# Patient Record
Sex: Male | Born: 1945 | Race: White | Hispanic: No | Marital: Married | Smoking: Former smoker
Health system: Southern US, Community
[De-identification: ages and names within clinical notes are randomized; demographics above are authoritative.]

## PROBLEM LIST (undated history)

## (undated) DIAGNOSIS — F4310 Post-traumatic stress disorder, unspecified: Secondary | ICD-10-CM

## (undated) DIAGNOSIS — M961 Postlaminectomy syndrome, not elsewhere classified: Secondary | ICD-10-CM

## (undated) DIAGNOSIS — M109 Gout, unspecified: Secondary | ICD-10-CM

## (undated) DIAGNOSIS — I10 Essential (primary) hypertension: Secondary | ICD-10-CM

## (undated) DIAGNOSIS — I272 Pulmonary hypertension, unspecified: Secondary | ICD-10-CM

## (undated) DIAGNOSIS — E785 Hyperlipidemia, unspecified: Secondary | ICD-10-CM

## (undated) DIAGNOSIS — E538 Deficiency of other specified B group vitamins: Secondary | ICD-10-CM

## (undated) DIAGNOSIS — H905 Unspecified sensorineural hearing loss: Secondary | ICD-10-CM

## (undated) DIAGNOSIS — G629 Polyneuropathy, unspecified: Secondary | ICD-10-CM

## (undated) DIAGNOSIS — I509 Heart failure, unspecified: Secondary | ICD-10-CM

## (undated) DIAGNOSIS — R251 Tremor, unspecified: Secondary | ICD-10-CM

## (undated) DIAGNOSIS — K7460 Unspecified cirrhosis of liver: Secondary | ICD-10-CM

## (undated) DIAGNOSIS — I700 Atherosclerosis of aorta: Secondary | ICD-10-CM

## (undated) DIAGNOSIS — I493 Ventricular premature depolarization: Secondary | ICD-10-CM

## (undated) DIAGNOSIS — E611 Iron deficiency: Secondary | ICD-10-CM

## (undated) DIAGNOSIS — I4891 Unspecified atrial fibrillation: Secondary | ICD-10-CM

## (undated) DIAGNOSIS — IMO0002 Reserved for concepts with insufficient information to code with codable children: Secondary | ICD-10-CM

## (undated) DIAGNOSIS — I779 Disorder of arteries and arterioles, unspecified: Secondary | ICD-10-CM

## (undated) DIAGNOSIS — G8929 Other chronic pain: Secondary | ICD-10-CM

## (undated) DIAGNOSIS — F32A Depression, unspecified: Secondary | ICD-10-CM

## (undated) DIAGNOSIS — F03A0 Unspecified dementia, mild, without behavioral disturbance, psychotic disturbance, mood disturbance, and anxiety: Secondary | ICD-10-CM

## (undated) DIAGNOSIS — K761 Chronic passive congestion of liver: Secondary | ICD-10-CM

## (undated) DIAGNOSIS — I2510 Atherosclerotic heart disease of native coronary artery without angina pectoris: Secondary | ICD-10-CM

## (undated) DIAGNOSIS — K219 Gastro-esophageal reflux disease without esophagitis: Secondary | ICD-10-CM

## (undated) DIAGNOSIS — I4729 Other ventricular tachycardia: Secondary | ICD-10-CM

## (undated) DIAGNOSIS — M545 Low back pain, unspecified: Secondary | ICD-10-CM

## (undated) DIAGNOSIS — K7400 Hepatic fibrosis, unspecified: Secondary | ICD-10-CM

## (undated) DIAGNOSIS — Q600 Renal agenesis, unilateral: Secondary | ICD-10-CM

## (undated) DIAGNOSIS — I255 Ischemic cardiomyopathy: Secondary | ICD-10-CM

## (undated) DIAGNOSIS — M48061 Spinal stenosis, lumbar region without neurogenic claudication: Secondary | ICD-10-CM

## (undated) DIAGNOSIS — K5790 Diverticulosis of intestine, part unspecified, without perforation or abscess without bleeding: Secondary | ICD-10-CM

## (undated) DIAGNOSIS — G4700 Insomnia, unspecified: Secondary | ICD-10-CM

## (undated) DIAGNOSIS — I447 Left bundle-branch block, unspecified: Secondary | ICD-10-CM

## (undated) DIAGNOSIS — I219 Acute myocardial infarction, unspecified: Secondary | ICD-10-CM

## (undated) DIAGNOSIS — D649 Anemia, unspecified: Secondary | ICD-10-CM

## (undated) DIAGNOSIS — F1021 Alcohol dependence, in remission: Secondary | ICD-10-CM

## (undated) DIAGNOSIS — N189 Chronic kidney disease, unspecified: Secondary | ICD-10-CM

## (undated) DIAGNOSIS — G894 Chronic pain syndrome: Secondary | ICD-10-CM

## (undated) DIAGNOSIS — R262 Difficulty in walking, not elsewhere classified: Secondary | ICD-10-CM

## (undated) DIAGNOSIS — K859 Acute pancreatitis without necrosis or infection, unspecified: Secondary | ICD-10-CM

## (undated) HISTORY — PX: LUMBAR SPINE SURGERY: SHX701

## (undated) HISTORY — PX: SACROILIAC JOINT FUSION: SHX6088

## (undated) HISTORY — PX: JOINT REPLACEMENT: SHX530

## (undated) HISTORY — PX: BACK SURGERY: SHX140

## (undated) HISTORY — PX: SHOULDER ARTHROSCOPY: SHX128

## (undated) HISTORY — PX: CARDIAC DEFIBRILLATOR PLACEMENT: SHX171

## (undated) HISTORY — PX: ATRIAL TACH ABLATION: EP1192

## (undated) HISTORY — PX: TOTAL KNEE ARTHROPLASTY: SHX125

## (undated) HISTORY — PX: CHOLECYSTECTOMY: SHX55

## (undated) HISTORY — PX: CORONARY ARTERY BYPASS GRAFT: SHX141

---
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
## 2003-10-26 ENCOUNTER — Other Ambulatory Visit: Payer: Self-pay

## 2004-11-14 ENCOUNTER — Ambulatory Visit: Payer: Self-pay | Admitting: General Surgery

## 2004-11-16 ENCOUNTER — Other Ambulatory Visit: Payer: Self-pay

## 2004-11-23 ENCOUNTER — Ambulatory Visit: Payer: Self-pay | Admitting: General Surgery

## 2004-12-21 ENCOUNTER — Ambulatory Visit: Payer: Self-pay | Admitting: General Surgery

## 2005-10-11 ENCOUNTER — Ambulatory Visit: Payer: Self-pay | Admitting: General Surgery

## 2006-05-30 IMAGING — US ABDOMEN ULTRASOUND
1 series · 14 of 25 positions shown · non-contrast
Comparison: none

REASON FOR EXAM: RUQ pain, follow-up CT of 07/23/2002 pancreas and history
of splenic hematoma
COMMENTS:

[Series 1: abdomen ultrasound · 0.38mm/px · 14 of 62 slices shown]
[im 1/62]
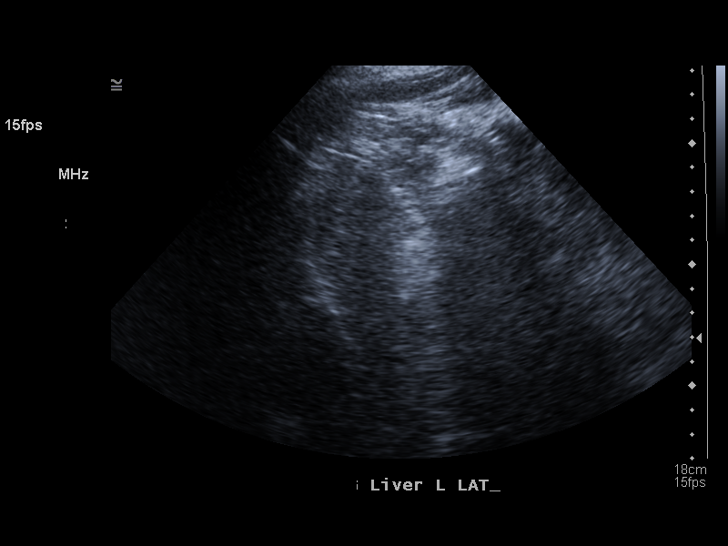
[im 6/62]
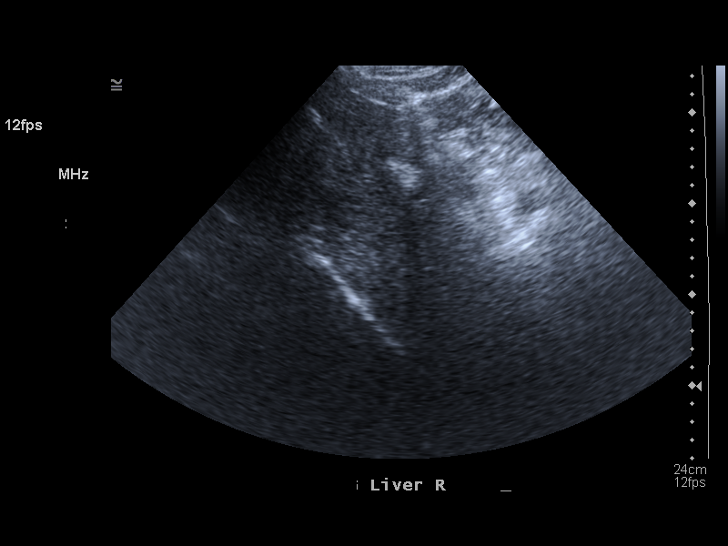
[im 11/62]
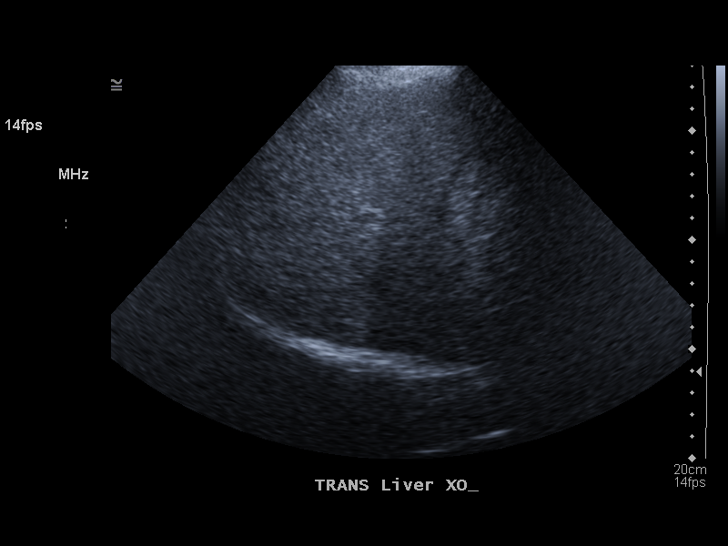
[im 16/62]
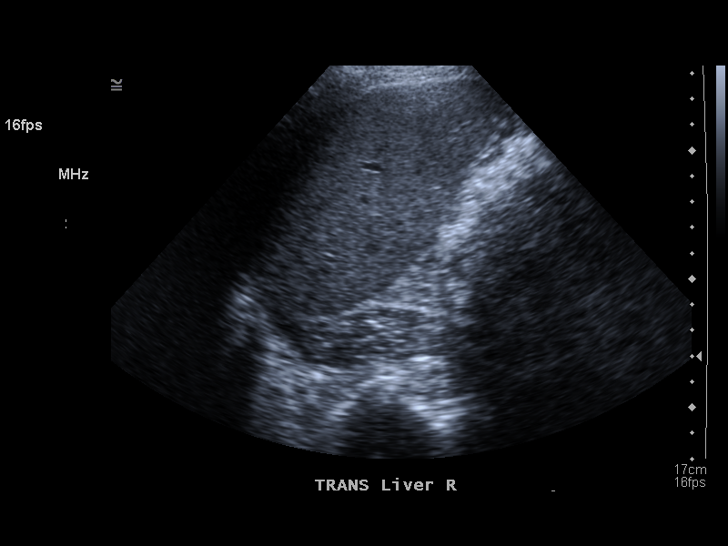
[im 21/62]
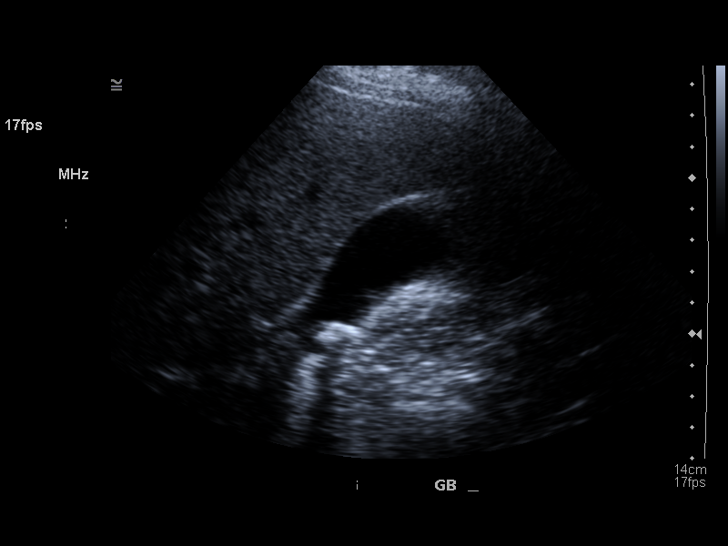
[im 23/62]
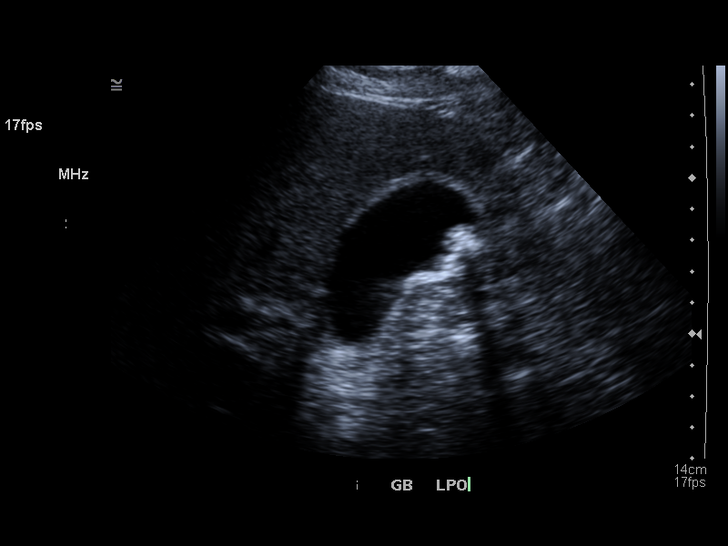
[im 28/62]
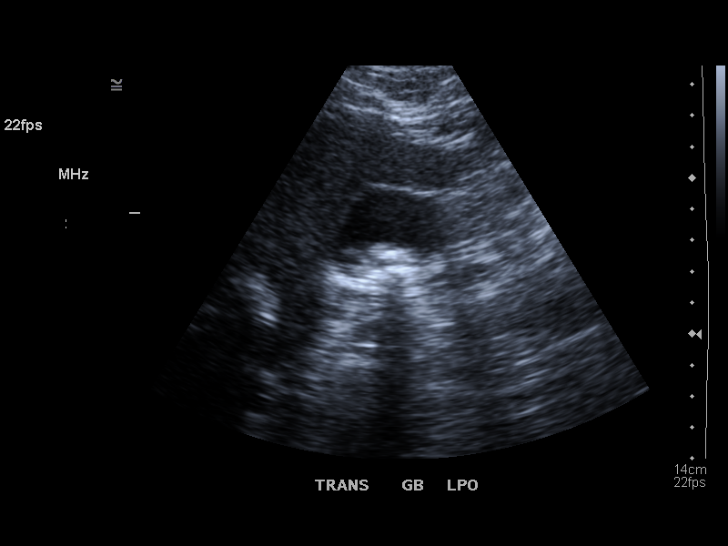
[im 34/62]
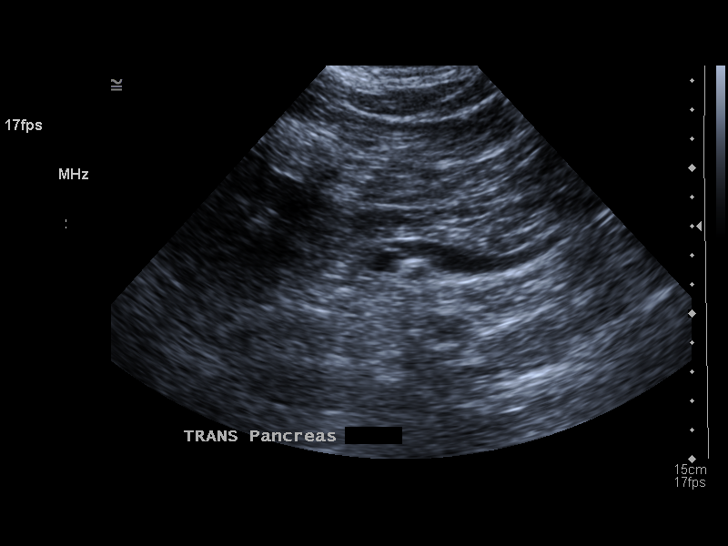
[im 39/62]
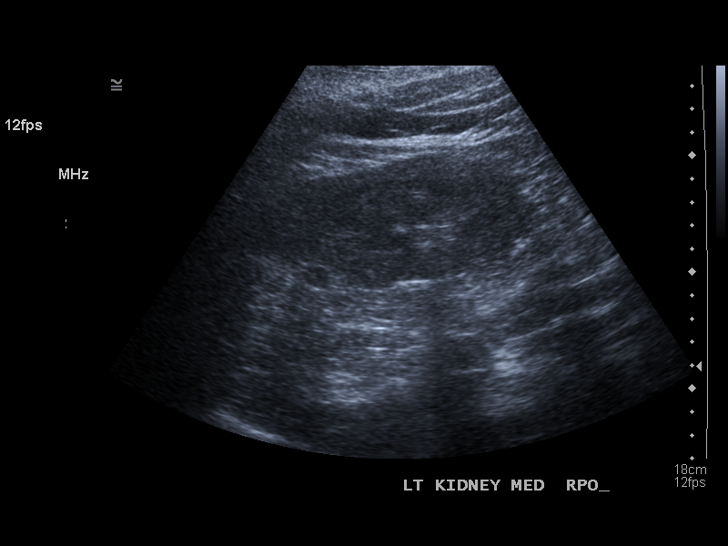
[im 41/62]
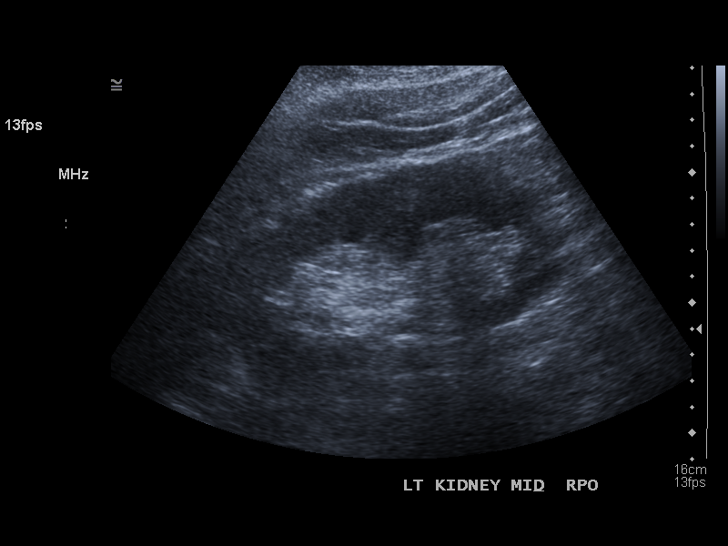
[im 46/62]
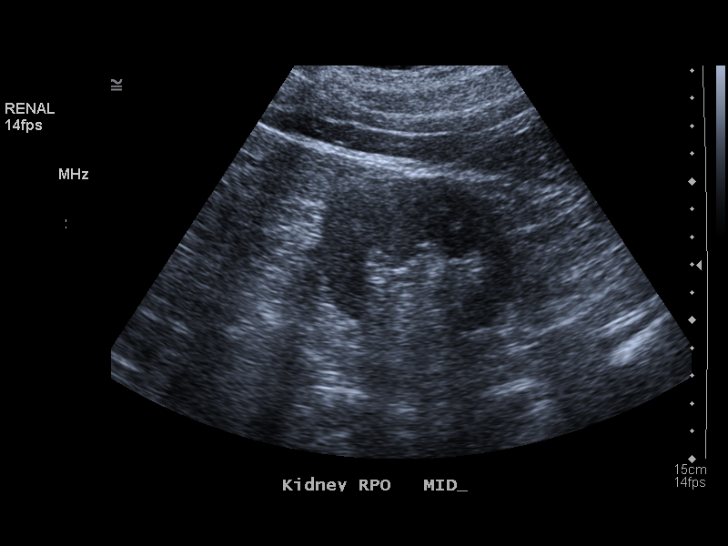
[im 51/62]
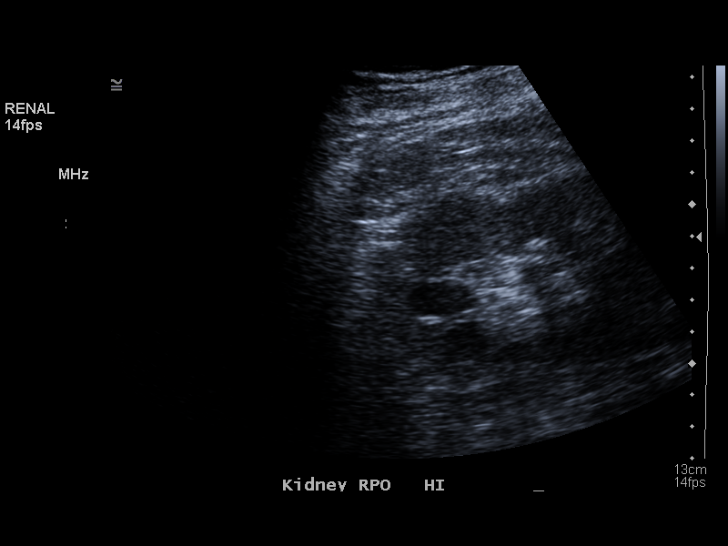
[im 56/62]
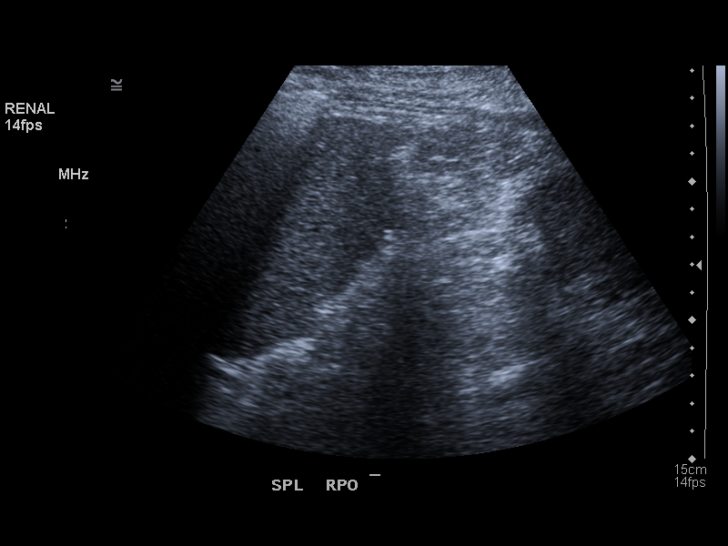
[im 62/62]
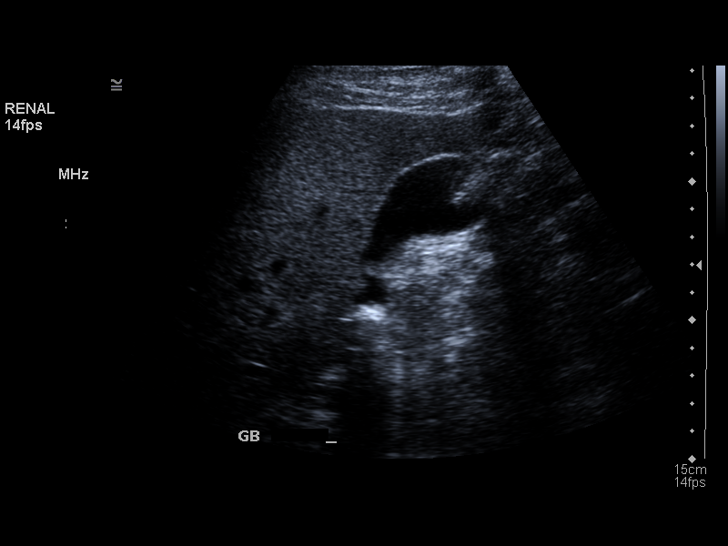

[14 of 25 positions shown; findings below may reference images not displayed]

PROCEDURE:     US  - US ABDOMEN GENERAL SURVEY  - November 14, 2004 [DATE]

RESULT:     Real-time imaging was obtained. The gallbladder is well
identified containing multiple echogenic densities with shadowing compatible
with gallstones. It appears there is a calculus lodged in the neck of the
gallbladder. The common duct appears within normal limits measuring 4.4 mm.

The pancreas is not well visualized. The liver and spleen appear normal in
size with appropriate echotexture. The RIGHT kidney is not identified. No
hydronephrosis is seen on the LEFT. There is a cyst identified in the upper
pole of the LEFT kidney measuring 2.0 x 1.2 cm.
IMPRESSION: 1.     Multiple gallstones are present with one possibly lodged in the neck
of the gallbladder. The gallbladder wall does not appear thickened. The
common duct is within normal limits in size.
2.     Absent RIGHT kidney is noted.
3.     No hydronephrosis on the LEFT.
4.     There is a cyst in the upper pole of the LEFT kidney.

## 2006-09-09 ENCOUNTER — Inpatient Hospital Stay: Payer: Self-pay | Admitting: Cardiology

## 2006-09-10 ENCOUNTER — Other Ambulatory Visit: Payer: Self-pay

## 2006-09-10 DIAGNOSIS — I219 Acute myocardial infarction, unspecified: Secondary | ICD-10-CM

## 2006-09-10 HISTORY — PX: LEFT HEART CATH AND CORONARY ANGIOGRAPHY: CATH118249

## 2006-09-10 HISTORY — DX: Acute myocardial infarction, unspecified: I21.9

## 2006-09-10 HISTORY — PX: CARDIAC CATHETERIZATION: SHX172

## 2006-09-11 ENCOUNTER — Other Ambulatory Visit: Payer: Self-pay

## 2006-09-13 DIAGNOSIS — Z951 Presence of aortocoronary bypass graft: Secondary | ICD-10-CM

## 2006-09-13 HISTORY — PX: CORONARY ARTERY BYPASS GRAFT: SHX141

## 2006-09-13 HISTORY — DX: Presence of aortocoronary bypass graft: Z95.1

## 2006-10-16 ENCOUNTER — Encounter: Payer: Self-pay | Admitting: Cardiology

## 2006-11-09 ENCOUNTER — Encounter: Payer: Self-pay | Admitting: Cardiology

## 2006-12-10 ENCOUNTER — Encounter: Payer: Self-pay | Admitting: Cardiology

## 2007-01-09 ENCOUNTER — Encounter: Payer: Self-pay | Admitting: Cardiology

## 2007-02-09 ENCOUNTER — Encounter: Payer: Self-pay | Admitting: Cardiology

## 2008-04-30 ENCOUNTER — Ambulatory Visit: Payer: Self-pay | Admitting: Internal Medicine

## 2010-12-05 ENCOUNTER — Ambulatory Visit: Payer: Self-pay | Admitting: Otolaryngology

## 2010-12-07 ENCOUNTER — Inpatient Hospital Stay: Payer: Self-pay | Admitting: Internal Medicine

## 2011-04-26 ENCOUNTER — Ambulatory Visit: Payer: Self-pay

## 2011-06-12 ENCOUNTER — Ambulatory Visit: Payer: Self-pay | Admitting: Unknown Physician Specialty

## 2011-06-12 DIAGNOSIS — I10 Essential (primary) hypertension: Secondary | ICD-10-CM

## 2011-06-21 ENCOUNTER — Inpatient Hospital Stay: Payer: Self-pay | Admitting: Unknown Physician Specialty

## 2012-08-18 ENCOUNTER — Ambulatory Visit: Payer: Self-pay | Admitting: Unknown Physician Specialty

## 2012-09-12 ENCOUNTER — Ambulatory Visit: Payer: Self-pay | Admitting: Unknown Physician Specialty

## 2013-03-10 ENCOUNTER — Ambulatory Visit: Payer: Self-pay | Admitting: Internal Medicine

## 2013-03-18 ENCOUNTER — Ambulatory Visit: Payer: Self-pay | Admitting: Internal Medicine

## 2013-09-07 ENCOUNTER — Ambulatory Visit: Payer: Self-pay | Admitting: Gastroenterology

## 2013-09-07 LAB — CBC WITH DIFFERENTIAL/PLATELET
Basophil #: 0 10*3/uL (ref 0.0–0.1)
Eosinophil #: 0.4 10*3/uL (ref 0.0–0.7)
HGB: 11.7 g/dL — ABNORMAL LOW (ref 13.0–18.0)
Lymphocyte %: 12.1 %
MCH: 24.7 pg — ABNORMAL LOW (ref 26.0–34.0)
MCHC: 31.4 g/dL — ABNORMAL LOW (ref 32.0–36.0)
MCV: 79 fL — ABNORMAL LOW (ref 80–100)
Monocyte %: 7.1 %
Neutrophil #: 4.7 10*3/uL (ref 1.4–6.5)
Platelet: 224 10*3/uL (ref 150–440)
RDW: 27.4 % — ABNORMAL HIGH (ref 11.5–14.5)
WBC: 6.3 10*3/uL (ref 3.8–10.6)

## 2013-09-07 LAB — PROTIME-INR: INR: 1

## 2013-09-08 LAB — PATHOLOGY REPORT

## 2014-06-21 ENCOUNTER — Ambulatory Visit: Payer: Self-pay | Admitting: Internal Medicine

## 2014-08-09 DIAGNOSIS — E785 Hyperlipidemia, unspecified: Secondary | ICD-10-CM | POA: Insufficient documentation

## 2014-08-09 DIAGNOSIS — I10 Essential (primary) hypertension: Secondary | ICD-10-CM | POA: Insufficient documentation

## 2014-08-09 DIAGNOSIS — I5022 Chronic systolic (congestive) heart failure: Secondary | ICD-10-CM | POA: Insufficient documentation

## 2015-03-09 ENCOUNTER — Other Ambulatory Visit: Payer: Self-pay | Admitting: Specialist

## 2015-03-09 DIAGNOSIS — M5186 Other intervertebral disc disorders, lumbar region: Secondary | ICD-10-CM

## 2015-03-17 ENCOUNTER — Ambulatory Visit
Admission: RE | Admit: 2015-03-17 | Discharge: 2015-03-17 | Disposition: A | Discharge status: Home / Self Care | Payer: Medicare Other | Source: Ambulatory Visit | Attending: Specialist | Admitting: Specialist

## 2015-03-17 DIAGNOSIS — M4807 Spinal stenosis, lumbosacral region: Secondary | ICD-10-CM | POA: Insufficient documentation

## 2015-03-17 DIAGNOSIS — M4806 Spinal stenosis, lumbar region: Secondary | ICD-10-CM | POA: Insufficient documentation

## 2015-03-17 DIAGNOSIS — M5186 Other intervertebral disc disorders, lumbar region: Secondary | ICD-10-CM

## 2015-03-17 DIAGNOSIS — M5136 Other intervertebral disc degeneration, lumbar region: Secondary | ICD-10-CM | POA: Diagnosis present

## 2015-04-05 ENCOUNTER — Other Ambulatory Visit: Payer: Self-pay | Admitting: Cardiology

## 2015-04-05 ENCOUNTER — Ambulatory Visit
Admission: RE | Admit: 2015-04-05 | Discharge: 2015-04-05 | Disposition: A | Discharge status: Home / Self Care | Payer: Medicare Other | Source: Ambulatory Visit | Attending: Cardiology | Admitting: Cardiology

## 2015-04-05 DIAGNOSIS — R079 Chest pain, unspecified: Secondary | ICD-10-CM | POA: Diagnosis not present

## 2015-04-05 DIAGNOSIS — Z01818 Encounter for other preprocedural examination: Secondary | ICD-10-CM

## 2015-04-05 HISTORY — DX: Acute myocardial infarction, unspecified: I21.9

## 2015-04-05 MED ORDER — TECHNETIUM TC 99M SESTAMIBI - CARDIOLITE
13.5300 | Freq: Once | INTRAVENOUS | Status: AC | PRN
  Administered 2015-04-05: 13.35 via INTRAVENOUS

## 2015-04-05 MED ORDER — REGADENOSON 0.4 MG/5ML IV SOLN
0.4000 mg | Freq: Once | INTRAVENOUS | Status: AC
  Administered 2015-04-05: 0.4 mg via INTRAVENOUS
  Filled 2015-04-05: qty 5

## 2015-04-05 MED ORDER — TECHNETIUM TC 99M SESTAMIBI - CARDIOLITE
30.0000 | Freq: Once | INTRAVENOUS | Status: AC | PRN
  Administered 2015-04-05: 14:00:00 32.53 via INTRAVENOUS

## 2015-04-06 LAB — NM MYOCAR MULTI W/SPECT W/WALL MOTION / EF
CHL CUP RESTING HR STRESS: 68 {beats}/min
CSEPEDS: 0 s
Estimated workload: 1 METS
Exercise duration (min): 1 min
Peak HR: 94 {beats}/min

## 2016-09-20 ENCOUNTER — Ambulatory Visit: Payer: No Typology Code available for payment source | Admitting: Podiatry

## 2016-09-24 ENCOUNTER — Other Ambulatory Visit: Payer: Self-pay

## 2016-09-24 ENCOUNTER — Ambulatory Visit (INDEPENDENT_AMBULATORY_CARE_PROVIDER_SITE_OTHER): Payer: Medicare Other | Admitting: Podiatry

## 2016-09-24 ENCOUNTER — Encounter: Payer: Self-pay | Admitting: Podiatry

## 2016-09-24 ENCOUNTER — Ambulatory Visit (INDEPENDENT_AMBULATORY_CARE_PROVIDER_SITE_OTHER): Payer: Medicare Other

## 2016-09-24 ENCOUNTER — Ambulatory Visit: Payer: Medicare Other

## 2016-09-24 VITALS — BP 151/83 | HR 81 | Resp 16 | Ht 69.0 in | Wt 189.0 lb

## 2016-09-24 DIAGNOSIS — M21619 Bunion of unspecified foot: Secondary | ICD-10-CM

## 2016-09-24 DIAGNOSIS — M779 Enthesopathy, unspecified: Secondary | ICD-10-CM

## 2016-09-24 DIAGNOSIS — G629 Polyneuropathy, unspecified: Secondary | ICD-10-CM

## 2016-09-24 MED ORDER — TRIAMCINOLONE ACETONIDE 10 MG/ML IJ SUSP
10.0000 mg | Freq: Once | INTRAMUSCULAR | Status: AC
  Administered 2016-09-24: 10 mg

## 2016-09-24 MED ORDER — GABAPENTIN 400 MG PO CAPS: 400.0000 mg | ORAL_CAPSULE | Freq: Three times a day (TID) | ORAL | 5 refills | Status: DC

## 2016-09-24 NOTE — Progress Notes (Signed)
   Subjective:    Patient ID: Danny ChampagneRichard Cully Hymes, male    DOB: [birth year], 71 y.o.   MRN: [id number]  HPI  Chief Complaint  Patient presents with  . Foot Pain    BL, Pt states that he "has burning and pain all over both feet" off and on x 1 year. Right foot is worse than left. Pt's PCP dx with him possible gout in the past.   . Possible Bunions    BL x 1 year.        Review of Systems     Objective:   Physical Exam        Assessment & Plan:

## 2016-09-25 ENCOUNTER — Telehealth: Payer: Self-pay | Admitting: *Deleted

## 2016-09-25 NOTE — Telephone Encounter (Addendum)
Pt states Dr. Charlsie Merlesegal put him on Gabapentin 400mg  and he reviewed the product information and it states helps with sleep and calm as two of the affects. Pt states he is already on Ambien at night and Zoloft during the day and he didn't want to over dose.09/28/2016-Dr. Regal states the make up of the Gabapentin is more geared to stopping nerve pain than to being a medication for sleep and would not likely cause any problems with the other medications. Left message informing pt of Dr. Beverlee Nimsegal's recommendation.

## 2016-09-25 NOTE — Progress Notes (Signed)
Subjective:     Patient ID: Danny ChampagneRichard Cully Kinner, male   DOB: March 12, 1946, 71 y.o.   MRN: [id number]  HPI patient presents with pain in his forefoot right over left and history of severe burning and numbness for about 8 months. He did have an accident and had significant back surgery about 9 months ago and the pain has progressed since   Review of Systems  All other systems reviewed and are negative.      Objective:   Physical Exam  Constitutional: He is oriented to person, place, and time.  Cardiovascular: Intact distal pulses.   Musculoskeletal: Normal range of motion.  Neurological: He is oriented to person, place, and time.  Skin: Skin is warm and dry.  Nursing note and vitals reviewed.  neurovascular status found to be intact with muscle strength adequate range of motion within normal limits with patient noted to have mild to moderate reduction sharp Dole vibratory. Patient's found have inflammation which is most intense around the first MPJ right over left with mild reduction of joint motion but does have pain pretty much across the entire metatarsal area. 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 Also appears to have inflammatory changes around the first MPJ right over left with fluid buildup which may be contributory to his pain cycle    Plan:     H&P conditions reviewed and careful injection administered around the first MPJ right 3 mg Kenalog 5 mg Xylocaine and discussed neuropathy and have recommended we start gabapentin with one to be taken at night followed by 1 morning and afternoon if tolerated well. I did explain that this may make him drowsy or give him other symptoms and he may not be able to use it long-term but I am hopeful it will be of benefit to him. Patient will be reevaluated again in the next 5 weeks and I also  recommended a softer orthotic but I want to wait until we get him stabilized first  X-ray report indicates that there is mild to moderate elevation of the first metatarsal segment bilateral with mild spurring around the first metatarsal head right over left

## 2016-09-25 NOTE — Telephone Encounter (Signed)
Pt called again and was asking if he should start the medication that Dr Charlsie Merlesegal prescribed. Per Val we are waiting to hear from Dr Charlsie Merlesegal who was in surgery today and she would call him when he lets her know what he should do.Pt was advised to wait until he hears from the nurse to start the medication. Pt stated he really wanted to start it today.

## 2016-10-01 NOTE — Telephone Encounter (Signed)
Fine to start

## 2016-10-29 ENCOUNTER — Ambulatory Visit: Payer: Medicare Other | Admitting: Podiatry

## 2016-12-08 ENCOUNTER — Encounter: Payer: Self-pay | Admitting: Internal Medicine

## 2016-12-08 ENCOUNTER — Inpatient Hospital Stay
Admission: EM | Admit: 2016-12-08 | Discharge: 2016-12-09 | DRG: 309 | Disposition: A | Discharge status: Home / Self Care | Payer: Medicare Other | Attending: Internal Medicine | Admitting: Internal Medicine

## 2016-12-08 ENCOUNTER — Emergency Department: Payer: Medicare Other

## 2016-12-08 DIAGNOSIS — I2510 Atherosclerotic heart disease of native coronary artery without angina pectoris: Secondary | ICD-10-CM | POA: Diagnosis present

## 2016-12-08 DIAGNOSIS — Q600 Renal agenesis, unilateral: Secondary | ICD-10-CM | POA: Diagnosis not present

## 2016-12-08 DIAGNOSIS — Z79899 Other long term (current) drug therapy: Secondary | ICD-10-CM | POA: Diagnosis not present

## 2016-12-08 DIAGNOSIS — I130 Hypertensive heart and chronic kidney disease with heart failure and stage 1 through stage 4 chronic kidney disease, or unspecified chronic kidney disease: Secondary | ICD-10-CM | POA: Diagnosis present

## 2016-12-08 DIAGNOSIS — I4891 Unspecified atrial fibrillation: Principal | ICD-10-CM | POA: Diagnosis present

## 2016-12-08 DIAGNOSIS — Z87891 Personal history of nicotine dependence: Secondary | ICD-10-CM | POA: Diagnosis not present

## 2016-12-08 DIAGNOSIS — Z951 Presence of aortocoronary bypass graft: Secondary | ICD-10-CM

## 2016-12-08 DIAGNOSIS — F329 Major depressive disorder, single episode, unspecified: Secondary | ICD-10-CM | POA: Diagnosis present

## 2016-12-08 DIAGNOSIS — I255 Ischemic cardiomyopathy: Secondary | ICD-10-CM | POA: Diagnosis present

## 2016-12-08 DIAGNOSIS — I5022 Chronic systolic (congestive) heart failure: Secondary | ICD-10-CM | POA: Diagnosis present

## 2016-12-08 DIAGNOSIS — R000 Tachycardia, unspecified: Secondary | ICD-10-CM | POA: Diagnosis present

## 2016-12-08 DIAGNOSIS — Z8249 Family history of ischemic heart disease and other diseases of the circulatory system: Secondary | ICD-10-CM

## 2016-12-08 DIAGNOSIS — R0602 Shortness of breath: Secondary | ICD-10-CM | POA: Diagnosis present

## 2016-12-08 DIAGNOSIS — Z885 Allergy status to narcotic agent status: Secondary | ICD-10-CM

## 2016-12-08 DIAGNOSIS — I252 Old myocardial infarction: Secondary | ICD-10-CM | POA: Diagnosis not present

## 2016-12-08 HISTORY — DX: Essential (primary) hypertension: I10

## 2016-12-08 HISTORY — DX: Heart failure, unspecified: I50.9

## 2016-12-08 HISTORY — DX: Chronic kidney disease, unspecified: N18.9

## 2016-12-08 LAB — COMPREHENSIVE METABOLIC PANEL
ALT: 20 U/L (ref 17–63)
ANION GAP: 11 (ref 5–15)
AST: 54 U/L — ABNORMAL HIGH (ref 15–41)
Albumin: 3.9 g/dL (ref 3.5–5.0)
Alkaline Phosphatase: 72 U/L (ref 38–126)
BUN: 12 mg/dL (ref 6–20)
CHLORIDE: 98 mmol/L — AB (ref 101–111)
CO2: 24 mmol/L (ref 22–32)
Calcium: 9.6 mg/dL (ref 8.9–10.3)
Creatinine, Ser: 1.25 mg/dL — ABNORMAL HIGH (ref 0.61–1.24)
GFR, EST NON AFRICAN AMERICAN: 57 mL/min — AB (ref >60)
Glucose, Bld: 99 mg/dL (ref 65–99)
POTASSIUM: 3.9 mmol/L (ref 3.5–5.1)
SODIUM: 133 mmol/L — AB (ref 135–145)
Total Bilirubin: 1.4 mg/dL — ABNORMAL HIGH (ref 0.3–1.2)
Total Protein: 7.3 g/dL (ref 6.5–8.1)

## 2016-12-08 LAB — TSH: TSH: 2.305 u[IU]/mL (ref 0.350–4.500)

## 2016-12-08 LAB — CBC WITH DIFFERENTIAL/PLATELET
BASOS ABS: 0.1 10*3/uL (ref 0–0.1)
BASOS PCT: 1 %
Eosinophils Absolute: 0.3 10*3/uL (ref 0–0.7)
Eosinophils Relative: 4 %
HCT: 38.8 % — ABNORMAL LOW (ref 40.0–52.0)
HEMOGLOBIN: 12.6 g/dL — AB (ref 13.0–18.0)
Lymphocytes Relative: 17 %
Lymphs Abs: 1.1 10*3/uL (ref 1.0–3.6)
MCH: 28.6 pg (ref 26.0–34.0)
MCHC: 32.5 g/dL (ref 32.0–36.0)
MCV: 88 fL (ref 80.0–100.0)
MONO ABS: 0.4 10*3/uL (ref 0.2–1.0)
Monocytes Relative: 7 %
NEUTROS ABS: 4.9 10*3/uL (ref 1.4–6.5)
NEUTROS PCT: 71 %
Platelets: 245 10*3/uL (ref 150–440)
RBC: 4.41 MIL/uL (ref 4.40–5.90)
RDW: 14 % (ref 11.5–14.5)
WBC: 6.7 10*3/uL (ref 3.8–10.6)

## 2016-12-08 LAB — BRAIN NATRIURETIC PEPTIDE: B NATRIURETIC PEPTIDE 5: 433 pg/mL — AB (ref 0.0–100.0)

## 2016-12-08 LAB — TROPONIN I
TROPONIN I: 0.03 ng/mL — AB (ref <0.03)
Troponin I: 0.03 ng/mL (ref <0.03)

## 2016-12-08 LAB — PROTIME-INR
INR: 1.05
Prothrombin Time: 13.7 seconds (ref 11.4–15.2)

## 2016-12-08 MED ORDER — MELATONIN 5 MG PO TABS
10.0000 mg | ORAL_TABLET | Freq: Every day | ORAL | Status: DC
  Administered 2016-12-09: 10 mg via ORAL
  Filled 2016-12-08: qty 2

## 2016-12-08 MED ORDER — ASPIRIN 81 MG PO CHEW
162.0000 mg | CHEWABLE_TABLET | Freq: Once | ORAL | Status: AC
  Administered 2016-12-08: 162 mg via ORAL
  Filled 2016-12-08: qty 2

## 2016-12-08 MED ORDER — GABAPENTIN 400 MG PO CAPS
400.0000 mg | ORAL_CAPSULE | Freq: Three times a day (TID) | ORAL | Status: DC
  Administered 2016-12-09 (×2): 400 mg via ORAL
  Filled 2016-12-08 (×2): qty 1

## 2016-12-08 MED ORDER — RAMIPRIL 5 MG PO CAPS
5.0000 mg | ORAL_CAPSULE | Freq: Every day | ORAL | Status: DC
  Administered 2016-12-09: 5 mg via ORAL
  Filled 2016-12-08: qty 1

## 2016-12-08 MED ORDER — METOPROLOL TARTRATE 50 MG PO TABS
ORAL_TABLET | ORAL | Status: AC
  Administered 2016-12-08: 50 mg via ORAL
  Filled 2016-12-08: qty 1

## 2016-12-08 MED ORDER — SERTRALINE HCL 100 MG PO TABS
100.0000 mg | ORAL_TABLET | Freq: Every day | ORAL | Status: DC
  Administered 2016-12-09: 100 mg via ORAL
  Filled 2016-12-08: qty 1

## 2016-12-08 MED ORDER — METOPROLOL TARTRATE 50 MG PO TABS
50.0000 mg | ORAL_TABLET | Freq: Two times a day (BID) | ORAL | Status: DC
  Administered 2016-12-08 – 2016-12-09 (×2): 50 mg via ORAL
  Filled 2016-12-08: qty 1

## 2016-12-08 MED ORDER — SODIUM CHLORIDE 0.9% FLUSH
3.0000 mL | Freq: Two times a day (BID) | INTRAVENOUS | Status: DC
  Administered 2016-12-09 (×2): 3 mL via INTRAVENOUS

## 2016-12-08 MED ORDER — HEPARIN BOLUS VIA INFUSION
4000.0000 [IU] | Freq: Once | INTRAVENOUS | Status: AC
  Administered 2016-12-09: 4000 [IU] via INTRAVENOUS
  Filled 2016-12-08: qty 4000

## 2016-12-08 MED ORDER — HEPARIN (PORCINE) IN NACL 100-0.45 UNIT/ML-% IJ SOLN
1000.0000 [IU]/h | INTRAMUSCULAR | Status: DC
  Administered 2016-12-09: 1000 [IU]/h via INTRAVENOUS
  Filled 2016-12-08 (×2): qty 250

## 2016-12-08 MED ORDER — PANTOPRAZOLE SODIUM 40 MG PO TBEC
40.0000 mg | DELAYED_RELEASE_TABLET | Freq: Every day | ORAL | Status: DC
  Administered 2016-12-09: 40 mg via ORAL
  Filled 2016-12-08: qty 1

## 2016-12-08 MED ORDER — POTASSIUM CHLORIDE CRYS ER 20 MEQ PO TBCR
20.0000 meq | EXTENDED_RELEASE_TABLET | Freq: Every day | ORAL | Status: DC
  Administered 2016-12-09: 20 meq via ORAL
  Filled 2016-12-08: qty 1

## 2016-12-08 MED ORDER — FUROSEMIDE 40 MG PO TABS
40.0000 mg | ORAL_TABLET | Freq: Two times a day (BID) | ORAL | Status: DC
  Administered 2016-12-09 (×2): 40 mg via ORAL
  Filled 2016-12-08 (×2): qty 1

## 2016-12-08 MED ORDER — DOCUSATE SODIUM 100 MG PO CAPS: 100.0000 mg | ORAL_CAPSULE | Freq: Two times a day (BID) | ORAL | Status: DC | PRN

## 2016-12-08 NOTE — H&P (Signed)
[facility] [place] [facility]   PATIENT NAME: Danny Marquez    MR#:  [id number]  DATE OF BIRTH:  1946/08/13  DATE OF ADMISSION:  12/08/2016  PRIMARY CARE PHYSICIAN: Jaclyn Shaggy, MD   REQUESTING/REFERRING PHYSICIAN: Vicki Mallet  CHIEF COMPLAINT:   Chief Complaint  Patient presents with  . Shortness of Breath    HISTORY OF PRESENT ILLNESS: Colin Ellers  is a 71 y.o. male with a known history of Congestive heart failure, ejection fraction 30%, congenital 1 kidney, hypertension, myocardial infarction, coronary artery disease status post CABG- for last 2-3 weeks has increasingly felt tiredness and shortness of breath with minimal exertion. He denies any associated chest pain, cough, weight gain, palpitation. Concerned with this he came to emergency room and he was found to have new onset atrial fibrillation with tachycardia up to rate 130 to 140.  PAST MEDICAL HISTORY:   Past Medical History:  Diagnosis Date  . CHF (congestive heart failure) (HCC)   . Chronic kidney disease    congenital one kidney  . Hypertension   . Myocardial infarction     PAST SURGICAL HISTORY: Past Surgical History:  Procedure Laterality Date  . BACK SURGERY    . CORONARY ARTERY BYPASS GRAFT      SOCIAL HISTORY:  Social History  Substance Use Topics  . Smoking status: Former Smoker    Years: 15.00    Types: Cigarettes  . Smokeless tobacco: Not on file  . Alcohol use 14.4 oz/week    24 Glasses of wine per week    FAMILY HISTORY:  Family History  Problem Relation Age of Onset  . Heart attack Father     DRUG ALLERGIES:  Allergies  Allergen Reactions  . Codeine     dizzy    REVIEW OF SYSTEMS:   CONSTITUTIONAL: No fever, fatigue or weakness.  EYES: No blurred or double vision.  EARS, NOSE, AND THROAT: No tinnitus or ear pain.  RESPIRATORY: No cough,Positive for shortness of breath, no wheezing or hemoptysis.  CARDIOVASCULAR: No chest pain, orthopnea, edema.   GASTROINTESTINAL: No nausea, vomiting, diarrhea or abdominal pain.  GENITOURINARY: No dysuria, hematuria.  ENDOCRINE: No polyuria, nocturia,  HEMATOLOGY: No anemia, easy bruising or bleeding SKIN: No rash or lesion. MUSCULOSKELETAL: No joint pain or arthritis.   NEUROLOGIC: No tingling, numbness, weakness.  PSYCHIATRY: No anxiety or depression.   MEDICATIONS AT HOME:  Prior to Admission medications   Medication Sig Start Date End Date Taking? Authorizing Provider  furosemide (LASIX) 40 MG tablet TAKE 1 TABLET BY MOUTH TWICE A DAY 06/22/16  Yes Historical Provider, MD  gabapentin (NEURONTIN) 400 MG capsule Take 1 capsule (400 mg total) by mouth 3 (three) times daily. 09/24/16  Yes Lenn Sink, DPM  Melatonin 5 MG TABS Take 10 mg by mouth at bedtime.   Yes Historical Provider, MD  metoprolol tartrate (LOPRESSOR) 25 MG tablet TAKE 1 TABLET (25 MG TOTAL) BY MOUTH 2 (TWO) TIMES DAILY. 04/30/16  Yes Historical Provider, MD  omeprazole (PRILOSEC) 20 MG capsule Take 20 mg by mouth daily.   Yes Historical Provider, MD  potassium chloride SA (KLOR-CON M20) 20 MEQ tablet TAKE 1 TABLET (20 MEQ TOTAL) BY MOUTH ONCE DAILY. AND EXTRA TABLET IF TAKES 2ND TABLET OF LASIX 05/01/16  Yes Historical Provider, MD  ramipril (ALTACE) 5 MG capsule TAKE 1 CAPSULE EVERY DAY BY MOUTH 05/24/16  Yes Historical Provider, MD  sertraline (ZOLOFT) 100 MG tablet  08/23/16  Yes Historical Provider, MD  zolpidem (AMBIEN) 10 MG tablet  08/29/16  Yes Historical Provider, MD  GLUCOSAMINE-CHONDROITIN PO Take by mouth.    Historical Provider, MD  TURMERIC CURCUMIN PO Take by mouth.    Historical Provider, MD      PHYSICAL EXAMINATION:   VITAL SIGNS: Blood pressure 136/87, pulse 87, temperature 98.7 F (37.1 C), temperature source Oral, resp. rate 13, height  (1.753 m), weight 90.7 kg (200 lb), SpO2 98 %.  GENERAL:  71 y.o.-year-old patient lying in the bed with no acute distress.  EYES: Pupils equal, round, reactive to  light and accommodation. No scleral icterus. Extraocular muscles intact.  HEENT: Head atraumatic, normocephalic. Oropharynx and nasopharynx clear.  NECK:  Supple, no jugular venous distention. No thyroid enlargement, no tenderness.  LUNGS: Normal breath sounds bilaterally, no wheezing, rales,rhonchi or crepitation. No use of accessory muscles of respiration.  CARDIOVASCULAR: S1, S2 Fast and irregular. No murmurs, rubs, or gallops.  ABDOMEN: Soft, nontender, nondistended. Bowel sounds present. No organomegaly or mass.  EXTREMITIES: No pedal edema, cyanosis, or clubbing.  NEUROLOGIC: Cranial nerves II through XII are intact. Muscle strength 5/5 in all extremities. Sensation intact. Gait not checked.  PSYCHIATRIC: The patient is alert and oriented x 3.  SKIN: No obvious rash, lesion, or ulcer.   LABORATORY PANEL:   CBC  Recent Labs Lab 12/08/16 2042  WBC 6.7  HGB 12.6*  HCT 38.8*  PLT 245  MCV 88.0  MCH 28.6  MCHC 32.5  RDW 14.0  LYMPHSABS 1.1  MONOABS 0.4  EOSABS 0.3  BASOSABS 0.1   ------------------------------------------------------------------------------------------------------------------  Chemistries   Recent Labs Lab 12/08/16 2042  NA 133*  K 3.9  CL 98*  CO2 24  GLUCOSE 99  BUN 12  CREATININE 1.25*  CALCIUM 9.6  AST 54*  ALT 20  ALKPHOS 72  BILITOT 1.4*   ------------------------------------------------------------------------------------------------------------------ estimated creatinine clearance is 61.2 mL/min (A) (by C-G formula based on SCr of 1.25 mg/dL (H)). ------------------------------------------------------------------------------------------------------------------  Recent Labs  12/08/16 2042  TSH 2.305     Coagulation profile  Recent Labs Lab 12/08/16 2042  INR 1.05   ------------------------------------------------------------------------------------------------------------------- No results for input(s): DDIMER in the last  72 hours. -------------------------------------------------------------------------------------------------------------------  Cardiac Enzymes  Recent Labs Lab 12/08/16 2042  TROPONINI 0.03*   ------------------------------------------------------------------------------------------------------------------ Invalid input(s): POCBNP  ---------------------------------------------------------------------------------------------------------------  Urinalysis No results found for: COLORURINE, APPEARANCEUR, LABSPEC, PHURINE, GLUCOSEU, HGBUR, BILIRUBINUR, KETONESUR, PROTEINUR, UROBILINOGEN, NITRITE, LEUKOCYTESUR   RADIOLOGY: Dg Chest 1 View  Result Date: 12/08/2016 CLINICAL DATA:  Acute onset of shortness of breath. Initial encounter. EXAM: CHEST 1 VIEW COMPARISON:  Chest radiograph performed 03/18/2013 FINDINGS: The lungs are well-aerated. Left basilar atelectasis is noted. There is no evidence of pleural effusion or pneumothorax. The cardiomediastinal silhouette is enlarged. The patient is status post median sternotomy. No acute osseous abnormalities are seen. IMPRESSION: Left basilar atelectasis.  Cardiomegaly. Electronically Signed   By: Roanna Raider M.D.   On: 12/08/2016 20:33    EKG: Orders placed or performed during the hospital encounter of 12/08/16  . ED EKG  . ED EKG  Atrial fibrillation with ventricular rate up to 120.  IMPRESSION AND PLAN:  * New onset atrial fibrillation with rapid ventricular response   Heart rate is running from 120 to 130- I will increase his metoprolol dose from 25 mg to 50 mg 2 times a day.   TSH is normal.   We will monitor on telemetry and follow serial troponin.   I will also started on heparin IV drip as  patient has CHF and coronary artery disease history.   And get cardiology consult and echocardiogram.  * Chronic systolic congestive heart failure   No signs of exacerbation, ejection fraction 35%   Continue Lasix.  * History of coronary  artery disease   Continue metoprolol, ramipril.  * Hypertension   Blood pressure is stable currently. Continue monitoring.    All the records are reviewed and case discussed with ED provider. Management plans discussed with the patient, family and they are in agreement.  CODE STATUS: Full code. Code Status History    This patient does not have a recorded code status. Please follow your organizational policy for patients in this situation.     Patient's wife is present in the room during my visit.  TOTAL TIME TAKING CARE OF THIS PATIENT: 50 minutes.    Altamese Dilling M.D on 12/08/2016   Between 7am to 6pm - Pager - [phone]  After 6pm go to [email or web address] - password [email or web address]  [facility]  Office  [phone]  CC: Primary care physician; Jaclyn Shaggy, MD   Note: This dictation was prepared with Dragon dictation along with smaller phrase technology. Any transcriptional errors that result from this process are unintentional.

## 2016-12-08 NOTE — Progress Notes (Signed)
ANTICOAGULATION CONSULT NOTE - Initial Consult  Pharmacy Consult for heparin dosing Indication: atrial fibrillation  Allergies  Allergen Reactions  . Codeine     dizzy    Patient Measurements: Height:  (175.3 cm) Weight: 200 lb (90.7 kg) IBW/kg (Calculated) : 70.7 Heparin Dosing Weight: 89 kg  Vital Signs: Temp: 98.7 F (37.1 C) (03/31 1924) Temp Source: Oral (03/31 1924) BP: 176/96 (03/31 2244) Pulse Rate: 118 (03/31 2244)  Labs:  Recent Labs  12/08/16 2042  HGB 12.6*  HCT 38.8*  PLT 245  LABPROT 13.7  INR 1.05  CREATININE 1.25*  TROPONINI 0.03*    Estimated Creatinine Clearance: 61.2 mL/min (A) (by C-G formula based on SCr of 1.25 mg/dL (H)).   Medical History: Past Medical History:  Diagnosis Date  . CHF (congestive heart failure) (HCC)   . Chronic kidney disease    congenital one kidney  . Hypertension   . Myocardial infarction     Medications:  Scheduled:  . heparin  4,000 Units Intravenous Once  . metoprolol tartrate  50 mg Oral BID    Assessment: Patient admitted for SOB w/ h/o CHF EF 30%, HTN, CAD s/p CABG and in afib. Pharmacy is consulted for heparin drip for afib  Goal of Therapy:  Heparin level 0.3-0.7 units/ml Monitor platelets by anticoagulation protocol: Yes   Plan:  Give 4000 units bolus x 1  Will start heparin drip @ 1000 units/hour Will check HL 4/1 @ 0500. Baseline aPTT/HL ordered.  Thank you for this consult.  Thomasene Ripple, PharmD, BCPS Clinical Pharmacist 12/08/2016

## 2016-12-08 NOTE — ED Notes (Addendum)
Pt placed in room by this tech, pt request to use restroom before being hooked to monitor, request granted and RN Rosanne Sack)  notified that pt needs to be placed on monitor.

## 2016-12-08 NOTE — ED Notes (Signed)
ED Provider at bedside. 

## 2016-12-08 NOTE — ED Provider Notes (Signed)
[facility] Emergency Department Provider Note  ____________________________________________   First MD Initiated Contact with Patient 12/08/16 1953     (approximate)  I have reviewed the triage vital signs and the nursing notes.   HISTORY  Chief Complaint Shortness of Breath    HPI Danny Marquez is a 71 y.o. male who comes to the emergency department with 30 minutes of exertional shortness of breath. He says that for the last 3 weeks or so he's had worsening exertional shortness of breath and fatigue. It always resolves when he sits down. He's never had any pain. 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 He has a last known ejection fraction of 35% and takes metoprolol 25 mg twice a day along with 40 mg of Lasix once a day. His cardiologist is Dr. Lady Gary.  He takes no aspirin Plavix or other blood thinners. He has never been in atrial fibrillation before.   Past Medical History:  Diagnosis Date  . CHF (congestive heart failure) (HCC)   . Chronic kidney disease    congenital one kidney  . Hypertension   . Myocardial infarction     Patient Active Problem List   Diagnosis Date Noted  . Atrial fibrillation with RVR (HCC) 12/08/2016  . Atrial fibrillation (HCC) 12/08/2016    Past Surgical History:  Procedure Laterality Date  . BACK SURGERY    . CORONARY ARTERY BYPASS GRAFT      Prior to Admission medications   Medication Sig Start Date End Date Taking? Authorizing Provider  furosemide (LASIX) 40 MG tablet TAKE 1 TABLET BY MOUTH TWICE A DAY 06/22/16  Yes Historical Provider, MD  gabapentin (NEURONTIN) 400 MG capsule Take 1 capsule (400 mg total) by mouth 3 (three) times daily. 09/24/16  Yes Lenn Sink, DPM  Melatonin 5 MG TABS Take 10 mg by mouth at bedtime.   Yes Historical Provider, MD  omeprazole (PRILOSEC) 20 MG capsule Take 20 mg by  mouth daily.   Yes Historical Provider, MD  potassium chloride SA (KLOR-CON M20) 20 MEQ tablet TAKE 1 TABLET (20 MEQ TOTAL) BY MOUTH ONCE DAILY. AND EXTRA TABLET IF TAKES 2ND TABLET OF LASIX 05/01/16  Yes Historical Provider, MD  ramipril (ALTACE) 5 MG capsule TAKE 1 CAPSULE EVERY DAY BY MOUTH 05/24/16  Yes Historical Provider, MD  sertraline (ZOLOFT) 100 MG tablet  08/23/16  Yes Historical Provider, MD  zolpidem (AMBIEN) 10 MG tablet  08/29/16  Yes Historical Provider, MD  apixaban (ELIQUIS) 5 MG TABS tablet Take 1 tablet (5 mg total) by mouth 2 (two) times daily. 12/09/16   Adrian Saran, MD  GLUCOSAMINE-CHONDROITIN PO Take by mouth.    Historical Provider, MD  metoprolol (LOPRESSOR) 50 MG tablet Take 1 tablet (50 mg total) by mouth 2 (two) times daily. 12/09/16   Adrian Saran, MD  TURMERIC CURCUMIN PO Take by mouth.    Historical Provider, MD    Allergies Codeine  Family History  Problem Relation Age of Onset  . Heart attack Father     Social History Social History  Substance Use Topics  . Smoking status: Former Smoker    Years: 15.00    Types: Cigarettes  . Smokeless tobacco: Not on file  . Alcohol use 14.4 oz/week    24 Glasses of wine per week    Review of Systems Constitutional: No fever/chills Eyes: No visual changes. ENT:  No sore throat. Cardiovascular: Denies chest pain. Respiratory: Positive shortness of breath. Gastrointestinal: No abdominal pain.  Positive nausea, no vomiting.  No diarrhea.  No constipation. Genitourinary: Negative for dysuria. Musculoskeletal: Negative for back pain. Skin: Negative for rash. Neurological: Negative for headaches, focal weakness or numbness.  10-point ROS otherwise negative.  ____________________________________________   PHYSICAL EXAM:  VITAL SIGNS: ED Triage Vitals [12/08/16 1924]  Enc Vitals Group     BP (!) 133/94     Pulse Rate (!) 54     Resp 18     Temp 98.7 F (37.1 C)     Temp Source Oral     SpO2 98 %     Weight  200 lb (90.7 kg)     Height  (1.753 m)     Head Circumference      Peak Flow      Pain Score      Pain Loc      Pain Edu?      Excl. in GC?     Constitutional: Alert and oriented x 4 well appearing nontoxic no diaphoresis speaks in full, clear sentences Eyes: PERRL EOMI. Head: Atraumatic. Nose: No congestion/rhinnorhea. Mouth/Throat: No trismus Neck: No stridor.   Cardiovascular: Irregularly irregular and tachycardic. Able to lie completely flat with minimal jugular venous distention Respiratory: Normal respiratory effort.  No retractions. Lungs CTAB and moving good air Gastrointestinal: Soft nondistended nontender no rebound no guarding no peritonitis no McBurney's tenderness negative Rovsing's no costovertebral tenderness negative Murphy's Musculoskeletal: No lower extremity edema  legs are equal in size Neurologic:  Normal speech and language. No gross focal neurologic deficits are appreciated. Skin:  Skin is warm, dry and intact. No rash noted. Psychiatric: Mood and affect are normal. Speech and behavior are normal.    ____________________________________________   DIFFERENTIAL  Congestive heart failure, acute coronary syndrome, pulmonary edema ____________________________________________   LABS (all labs ordered are listed, but only abnormal results are displayed)  Labs Reviewed  COMPREHENSIVE METABOLIC PANEL - Abnormal; Notable for the following:       Result Value   Sodium 133 (*)    Chloride 98 (*)    Creatinine, Ser 1.25 (*)    AST 54 (*)    Total Bilirubin 1.4 (*)    GFR calc non Af Amer 57 (*)    All other components within normal limits  TROPONIN I - Abnormal; Notable for the following:    Troponin I 0.03 (*)    All other components within normal limits  BRAIN NATRIURETIC PEPTIDE - Abnormal; Notable for the following:    B Natriuretic Peptide 433.0 (*)    All other components within normal limits  CBC WITH DIFFERENTIAL/PLATELET - Abnormal; Notable  for the following:    Hemoglobin 12.6 (*)    HCT 38.8 (*)    All other components within normal limits  TROPONIN I - Abnormal; Notable for the following:    Troponin I 0.03 (*)    All other components within normal limits  TROPONIN I - Abnormal; Notable for the following:    Troponin I 0.04 (*)    All other components within normal limits  TROPONIN I - Abnormal; Notable for the following:    Troponin I 0.04 (*)    All other components within normal limits  HEPARIN LEVEL (UNFRACTIONATED) - Abnormal; Notable for the following:    Heparin Unfractionated <0.10 (*)    All other components within normal limits  BASIC METABOLIC PANEL - Abnormal; Notable  for the following:    Sodium 133 (*)    Potassium 3.3 (*)    Chloride 95 (*)    Glucose, Bld 118 (*)    GFR calc non Af Amer 60 (*)    All other components within normal limits  CBC - Abnormal; Notable for the following:    RBC 4.26 (*)    Hemoglobin 12.4 (*)    HCT 38.1 (*)    All other components within normal limits  PROTIME-INR  TSH  APTT  HEPARIN LEVEL (UNFRACTIONATED)  HEPARIN LEVEL (UNFRACTIONATED)    Elevated troponin consistent with either CHF exacerbation or acute coronary syndrome __________________________________________  EKG  ED ECG REPORT I, Merrily Brittle, the attending physician, personally viewed and interpreted this ECG.  Date: 12/09/2016 Rate: 108 Rhythm: Atrial fibrillation with rapid ventricular response QRS Axis: normal Intervals: normal ST/T Wave abnormalities: normal Conduction Disturbances: none Narrative Interpretation: Abnormal but no signs of acute ischemia  ____________________________________________  RADIOLOGY  Chest x-ray with cardiomegaly ____________________________________________   PROCEDURES  Procedure(s) performed: no  Procedures  Critical Care performed: no  ____________________________________________   INITIAL IMPRESSION / ASSESSMENT AND PLAN / ED  COURSE  Pertinent labs & imaging results that were available during my care of the patient were reviewed by me and considered in my medical decision making (see chart for details).  The patient arrives to the emergency department euvolemic but with new onset atrial fibrillation. He is intermittently up to the 1teens but is generally rate controlled. More concerning is his exertional shortness of breath which could be an anginal equivalent. At this point he requires inpatient admission for complete rule out and evaluation by cardiology.      ____________________________________________   FINAL CLINICAL IMPRESSION(S) / ED DIAGNOSES  Final diagnoses:  Atrial fibrillation with RVR (HCC)      NEW MEDICATIONS STARTED DURING THIS VISIT:  Discharge Medication List as of 12/09/2016 10:26 AM    START taking these medications   Details  apixaban (ELIQUIS) 5 MG TABS tablet Take 1 tablet (5 mg total) by mouth 2 (two) times daily., Starting Sun 12/09/2016, Print         Note:  This document was prepared using Dragon voice recognition software and may include unintentional dictation errors.     Merrily Brittle, MD 12/09/16 [phone]

## 2016-12-08 NOTE — ED Triage Notes (Signed)
Patient reports having history of CHF.  Reports having increasing shortness of breath.  Patient denies any history of A Fib. Which is shown on EKG.

## 2016-12-08 NOTE — ED Notes (Signed)
Pt reports 11 yrs ago triple bypass and 4-5 yrs ago pt states being diagnosed with CHF.

## 2016-12-08 NOTE — ED Notes (Signed)
Pt denies cough, fever or chest pain.

## 2016-12-08 NOTE — ED Notes (Signed)
Pt given sandwich tray and drink per MD.

## 2016-12-09 DIAGNOSIS — F329 Major depressive disorder, single episode, unspecified: Secondary | ICD-10-CM | POA: Diagnosis not present

## 2016-12-09 DIAGNOSIS — I252 Old myocardial infarction: Secondary | ICD-10-CM | POA: Diagnosis not present

## 2016-12-09 DIAGNOSIS — Z8249 Family history of ischemic heart disease and other diseases of the circulatory system: Secondary | ICD-10-CM | POA: Diagnosis not present

## 2016-12-09 DIAGNOSIS — Z885 Allergy status to narcotic agent status: Secondary | ICD-10-CM | POA: Diagnosis not present

## 2016-12-09 DIAGNOSIS — R0602 Shortness of breath: Secondary | ICD-10-CM | POA: Diagnosis present

## 2016-12-09 DIAGNOSIS — I2510 Atherosclerotic heart disease of native coronary artery without angina pectoris: Secondary | ICD-10-CM | POA: Diagnosis not present

## 2016-12-09 DIAGNOSIS — I4891 Unspecified atrial fibrillation: Secondary | ICD-10-CM | POA: Diagnosis not present

## 2016-12-09 DIAGNOSIS — Z951 Presence of aortocoronary bypass graft: Secondary | ICD-10-CM | POA: Diagnosis not present

## 2016-12-09 DIAGNOSIS — I130 Hypertensive heart and chronic kidney disease with heart failure and stage 1 through stage 4 chronic kidney disease, or unspecified chronic kidney disease: Secondary | ICD-10-CM | POA: Diagnosis not present

## 2016-12-09 DIAGNOSIS — Z87891 Personal history of nicotine dependence: Secondary | ICD-10-CM | POA: Diagnosis not present

## 2016-12-09 DIAGNOSIS — Q600 Renal agenesis, unilateral: Secondary | ICD-10-CM | POA: Diagnosis not present

## 2016-12-09 DIAGNOSIS — R000 Tachycardia, unspecified: Secondary | ICD-10-CM | POA: Diagnosis not present

## 2016-12-09 DIAGNOSIS — I255 Ischemic cardiomyopathy: Secondary | ICD-10-CM | POA: Diagnosis not present

## 2016-12-09 DIAGNOSIS — Z79899 Other long term (current) drug therapy: Secondary | ICD-10-CM | POA: Diagnosis not present

## 2016-12-09 DIAGNOSIS — I5022 Chronic systolic (congestive) heart failure: Secondary | ICD-10-CM | POA: Diagnosis not present

## 2016-12-09 LAB — TROPONIN I
TROPONIN I: 0.04 ng/mL — AB (ref <0.03)
Troponin I: 0.04 ng/mL (ref <0.03)

## 2016-12-09 LAB — BASIC METABOLIC PANEL
Anion gap: 12 (ref 5–15)
BUN: 13 mg/dL (ref 6–20)
CALCIUM: 9.4 mg/dL (ref 8.9–10.3)
CO2: 26 mmol/L (ref 22–32)
CREATININE: 1.2 mg/dL (ref 0.61–1.24)
Chloride: 95 mmol/L — ABNORMAL LOW (ref 101–111)
GFR calc Af Amer: >60 mL/min (ref >60)
GFR, EST NON AFRICAN AMERICAN: 60 mL/min — AB (ref >60)
Glucose, Bld: 118 mg/dL — ABNORMAL HIGH (ref 65–99)
Potassium: 3.3 mmol/L — ABNORMAL LOW (ref 3.5–5.1)
SODIUM: 133 mmol/L — AB (ref 135–145)

## 2016-12-09 LAB — CBC
HCT: 38.1 % — ABNORMAL LOW (ref 40.0–52.0)
Hemoglobin: 12.4 g/dL — ABNORMAL LOW (ref 13.0–18.0)
MCH: 29.1 pg (ref 26.0–34.0)
MCHC: 32.5 g/dL (ref 32.0–36.0)
MCV: 89.4 fL (ref 80.0–100.0)
PLATELETS: 238 10*3/uL (ref 150–440)
RBC: 4.26 MIL/uL — ABNORMAL LOW (ref 4.40–5.90)
RDW: 14.1 % (ref 11.5–14.5)
WBC: 6.3 10*3/uL (ref 3.8–10.6)

## 2016-12-09 LAB — APTT: aPTT: 32 seconds (ref 24–36)

## 2016-12-09 LAB — HEPARIN LEVEL (UNFRACTIONATED)
HEPARIN UNFRACTIONATED: 0.45 [IU]/mL (ref 0.30–0.70)
Heparin Unfractionated: 0.33 IU/mL (ref 0.30–0.70)

## 2016-12-09 MED ORDER — METOPROLOL TARTRATE 50 MG PO TABS: 50.0000 mg | ORAL_TABLET | Freq: Two times a day (BID) | ORAL | 0 refills | Status: DC

## 2016-12-09 MED ORDER — APIXABAN 5 MG PO TABS
5.0000 mg | ORAL_TABLET | Freq: Two times a day (BID) | ORAL | Status: DC
  Administered 2016-12-09: 5 mg via ORAL
  Filled 2016-12-09: qty 1

## 2016-12-09 MED ORDER — POTASSIUM CHLORIDE CRYS ER 20 MEQ PO TBCR
20.0000 meq | EXTENDED_RELEASE_TABLET | Freq: Once | ORAL | Status: AC
  Administered 2016-12-09: 20 meq via ORAL
  Filled 2016-12-09: qty 1

## 2016-12-09 MED ORDER — APIXABAN 5 MG PO TABS
5.0000 mg | ORAL_TABLET | Freq: Two times a day (BID) | ORAL | 0 refills | Status: DC
Start: 2016-12-09 — End: 2017-03-02

## 2016-12-09 NOTE — Progress Notes (Signed)
A&O. Up with one assist. Found to be in Afib which is new. On heparin drip.

## 2016-12-09 NOTE — Progress Notes (Signed)
Patient with discharge orders. First dose of eliquis given and heparin gtt stopped. Eliquis handout/education given to patient - pharmacist to round in a little while with more education. Per Dr. Darrold Junker, echo can be done outpatient.

## 2016-12-09 NOTE — Progress Notes (Signed)
ANTICOAGULATION CONSULT NOTE - Initial Consult  Pharmacy Consult for heparin dosing Indication: atrial fibrillation       Allergies  Allergen Reactions  . Codeine     dizzy    Patient Measurements: Height:  (175.3 cm) Weight: 200 lb (90.7 kg) IBW/kg (Calculated) : 70.7 Heparin Dosing Weight: 89 kg  Vital Signs: Temp: 98.7 F (37.1 C) (03/31 1924) Temp Source: Oral (03/31 1924) BP: 176/96 (03/31 2244) Pulse Rate: 118 (03/31 2244)  Labs:  Recent Labs (last 2 labs)    Recent Labs  12/08/16 2042  HGB 12.6*  HCT 38.8*  PLT 245  LABPROT 13.7  INR 1.05  CREATININE 1.25*  TROPONINI 0.03*      Estimated Creatinine Clearance: 61.2 mL/min (A) (by C-G formula based on SCr of 1.25 mg/dL (H)).   Medical History:     Past Medical History:  Diagnosis Date  . CHF (congestive heart failure) (HCC)   . Chronic kidney disease    congenital one kidney  . Hypertension   . Myocardial infarction     Medications:  Scheduled:  . heparin  4,000 Units Intravenous Once  . metoprolol tartrate  50 mg Oral BID    Assessment: Patient admitted for SOB w/ h/o CHF EF 30%, HTN, CAD s/p CABG and in afib. Pharmacy is consulted for heparin drip for afib  Goal of Therapy:  Heparin level 0.3-0.7 units/ml Monitor platelets by anticoagulation protocol: Yes   Plan:  Give 4000 units bolus x 1  Will start heparin drip @ 1000 units/hour Will check HL 4/1 @ 0500. Baseline aPTT/HL ordered.  4/1 @ 0252 HL drawn too early -- will draw another HL @ 0830 and will reassess.  Thank you for this consult.  Thomasene Ripple, PharmD, BCPS Clinical Pharmacist 12/09/2016

## 2016-12-09 NOTE — Consult Note (Signed)
[facility] Cardiology  CARDIOLOGY CONSULT NOTE  Patient ID: Danny Marquez MRN: [id number] DOB/AGE: 71-Feb-1947 71 y.o.  Admit date: 12/08/2016 Referring Physician Mody Primary Physician [facility] Primary Cardiologist Fath Reason for Consultation Atrial fibrillation  HPI: 71 year old gentleman referred for evaluation of atrial fibrillation. The patient has known history of coronary artery disease, status post CABG 09/13/2016. Known ischemic cardiomyopathy, chronic systolic congestive heart failure. Lexiscan Myoview study 04/05/2015 revealed LVEF of 30-35% without evidence for ischemia. The patient reports a one-week history of generalized fatigue, without chest pain. Yesterday, the patient patient mowed his lawn, fatigue or shortness of breath. He denies chest pain. The patient was urged to come to the emergency room by his wife. In the emergency room, the patient was noted be in atrial fibrillation a rate of 130-40 bpm. Admission labs were notable for negative troponin of 0.03. ECG did not reveal any ischemia ischemic ST-T wave changes. Metoprolol was increased from 25-50 mg twice a day overall controlled ventricular rate. Today, patient reports feeling much better, denies chest pain or shortness of breath.  Review of systems complete and found to be negative unless listed above     Past Medical History:  Diagnosis Date  . CHF (congestive heart failure) (HCC)   . Chronic kidney disease    congenital one kidney  . Hypertension   . Myocardial infarction     Past Surgical History:  Procedure Laterality Date  . BACK SURGERY    . CORONARY ARTERY BYPASS GRAFT      Prescriptions Prior to Admission  Medication Sig Dispense Refill Last Dose  . furosemide (LASIX) 40 MG tablet TAKE 1 TABLET BY MOUTH TWICE A DAY   12/08/2016 at Unknown time  . gabapentin (NEURONTIN) 400 MG capsule Take 1 capsule (400 mg total) by mouth 3 (three) times daily. 90 capsule 5 12/08/2016 at Unknown time  . Melatonin 5 MG TABS  Take 10 mg by mouth at bedtime.   12/07/2016 at Unknown time  . metoprolol tartrate (LOPRESSOR) 25 MG tablet TAKE 1 TABLET (25 MG TOTAL) BY MOUTH 2 (TWO) TIMES DAILY.   12/08/2016 at Unknown time  . omeprazole (PRILOSEC) 20 MG capsule Take 20 mg by mouth daily.   12/08/2016 at Unknown time  . potassium chloride SA (KLOR-CON M20) 20 MEQ tablet TAKE 1 TABLET (20 MEQ TOTAL) BY MOUTH ONCE DAILY. AND EXTRA TABLET IF TAKES 2ND TABLET OF LASIX   12/08/2016 at Unknown time  . ramipril (ALTACE) 5 MG capsule TAKE 1 CAPSULE EVERY DAY BY MOUTH   12/08/2016 at Unknown time  . sertraline (ZOLOFT) 100 MG tablet    12/08/2016 at Unknown time  . zolpidem (AMBIEN) 10 MG tablet    12/07/2016 at Unknown time  . GLUCOSAMINE-CHONDROITIN PO Take by mouth.   Not Taking at Unknown time  . TURMERIC CURCUMIN PO Take by mouth.   Not Taking at Unknown time   Social History   Social History  . Marital status: Married    Spouse name: N/A  . Number of children: N/A  . Years of education: N/A   Occupational History  . Not on file.   Social History Main Topics  . Smoking status: Former Smoker    Years: 15.00    Types: Cigarettes  . Smokeless tobacco: Not on file  . Alcohol use 14.4 oz/week    24 Glasses of wine per week  . Drug use: Unknown  . Sexual activity: Not on file   Other Topics Concern  . Not on  file   Social History Narrative  . No narrative on file    Family History  Problem Relation Age of Onset  . Heart attack Father       Review of systems complete and found to be negative unless listed above      PHYSICAL EXAM  General: Well developed, well nourished, in no acute distress HEENT:  Normocephalic and atramatic Neck:  No JVD.  Lungs: Clear bilaterally to auscultation and percussion. Heart: HRRR . Normal S1 and S2 without gallops or murmurs.  Abdomen: Bowel sounds are positive, abdomen soft and non-tender  Msk:  Back normal, normal gait. Normal strength and tone for age. Extremities: No  clubbing, cyanosis or edema.   Neuro: Alert and oriented X 3. Psych:  Good affect, responds appropriately  Labs:   Lab Results  Component Value Date   WBC 6.3 12/09/2016   HGB 12.4 (L) 12/09/2016   HCT 38.1 (L) 12/09/2016   MCV 89.4 12/09/2016   PLT 238 12/09/2016    Recent Labs Lab 12/08/16 2042 12/09/16 0252  NA 133* 133*  K 3.9 3.3*  CL 98* 95*  CO2 24 26  BUN 12 13  CREATININE 1.25* 1.20  CALCIUM 9.6 9.4  PROT 7.3  --   BILITOT 1.4*  --   ALKPHOS 72  --   ALT 20  --   AST 54*  --   GLUCOSE 99 118*   Lab Results  Component Value Date   TROPONINI 0.04 (HH) 12/09/2016   No results found for: CHOL No results found for: HDL No results found for: LDLCALC No results found for: TRIG No results found for: CHOLHDL No results found for: LDLDIRECT    Radiology: Dg Chest 1 View  Result Date: 12/08/2016 CLINICAL DATA:  Acute onset of shortness of breath. Initial encounter. EXAM: CHEST 1 VIEW COMPARISON:  Chest radiograph performed 03/18/2013 FINDINGS: The lungs are well-aerated. Left basilar atelectasis is noted. There is no evidence of pleural effusion or pneumothorax. The cardiomediastinal silhouette is enlarged. The patient is status post median sternotomy. No acute osseous abnormalities are seen. IMPRESSION: Left basilar atelectasis.  Cardiomegaly. Electronically Signed   By: Roanna Raider M.D.   On: 12/08/2016 20:33    EKG: Atrial fibrillation  ASSESSMENT AND PLAN:   1. New-onset atrial fibrillation, of probable one-week duration, chads Vasc of 4, rate controlled on metoprolol tartrate 2. CAD, status post CABG, no chest pain, negative troponin  Recommendations  1. DC heparin 2. Start Eliquis 5 mg twice a day 3. Continue metoprolol 50 mg twice a day 4. 2-D echocardiogram, can be done as outpatient 5. Follow-up with Dr. Lady Gary this week    Signed: Marcina Millard MD,PhD, [facility] 12/09/2016, 9:30 AM

## 2016-12-09 NOTE — Discharge Summary (Signed)
[facility] [place] at [facility]   PATIENT NAME: Danny Marquez    MR#:  [id number]  DATE OF BIRTH:  October 27, 1945  DATE OF ADMISSION:  12/08/2016 ADMITTING PHYSICIAN: Altamese Dilling, MD  DATE OF DISCHARGE: 12/09/2016  PRIMARY CARE PHYSICIAN: Jaclyn Shaggy, MD    ADMISSION DIAGNOSIS:  Diff Breathing  DISCHARGE DIAGNOSIS:  Principal Problem:   Atrial fibrillation with RVR (HCC) Active Problems:   Atrial fibrillation (HCC)   SECONDARY DIAGNOSIS:   Past Medical History:  Diagnosis Date  . CHF (congestive heart failure) (HCC)   . Chronic kidney disease    congenital one kidney  . Hypertension   . Myocardial infarction     HOSPITAL COURSE:   71 year old male with a history of chronic systolic heart failure ejection fraction 30%, congenital kidney and CAD who presents with shortness of breath and found to have new onset atrial fibrillation.   1. Atrial fibrillation with RVR, new: Patient's heart rate is better controlled On increased dose of metoprolol.  Patient evaluated by cardiology and has recommending starting patient on Eliquis.  Side effects alternatives, risks and benefits discussed with patient  ECHO will be dome as an outpatient  2. Chronic systolic heart failure without exacerbation: Continue metoprolol and ramipril and Lasix   3. Depression: Continue Zoloft   DISCHARGE CONDITIONS AND DIET:    Stable for discharge on heart healthy diet  CONSULTS OBTAINED:  Treatment Team:  Laurier Nancy, MD Marcina Millard, MD  DRUG ALLERGIES:   Allergies  Allergen Reactions  . Codeine     dizzy    DISCHARGE MEDICATIONS:   Current Discharge Medication List    START taking these medications   Details  apixaban (ELIQUIS) 5 MG TABS tablet Take 1 tablet (5 mg total) by mouth 2 (two) times daily. Qty: 60 tablet, Refills: 0      CONTINUE these medications which have CHANGED   Details  metoprolol (LOPRESSOR) 50 MG tablet Take 1  tablet (50 mg total) by mouth 2 (two) times daily. Qty: 60 tablet, Refills: 0      CONTINUE these medications which have NOT CHANGED   Details  furosemide (LASIX) 40 MG tablet TAKE 1 TABLET BY MOUTH TWICE A DAY    gabapentin (NEURONTIN) 400 MG capsule Take 1 capsule (400 mg total) by mouth 3 (three) times daily. Qty: 90 capsule, Refills: 5    Melatonin 5 MG TABS Take 10 mg by mouth at bedtime.    omeprazole (PRILOSEC) 20 MG capsule Take 20 mg by mouth daily.    potassium chloride SA (KLOR-CON M20) 20 MEQ tablet TAKE 1 TABLET (20 MEQ TOTAL) BY MOUTH ONCE DAILY. AND EXTRA TABLET IF TAKES 2ND TABLET OF LASIX    ramipril (ALTACE) 5 MG capsule TAKE 1 CAPSULE EVERY DAY BY MOUTH    sertraline (ZOLOFT) 100 MG tablet     zolpidem (AMBIEN) 10 MG tablet     GLUCOSAMINE-CHONDROITIN PO Take by mouth.    TURMERIC CURCUMIN PO Take by mouth.          Today   CHIEF COMPLAINT:  No acute issues overnight. Heart rate better control VITAL SIGNS:  Blood pressure (!) 133/93, pulse 81, temperature 98.3 F (36.8 C), temperature source Oral, resp. rate 18, height  (1.753 m), weight 90.5 kg (199 lb 8 oz), SpO2 97 %.   REVIEW OF SYSTEMS:  Review of Systems  Constitutional: Negative.  Negative for chills, fever and malaise/fatigue.  HENT: Negative.  Negative for ear  discharge, ear pain, hearing loss, nosebleeds and sore throat.   Eyes: Negative.  Negative for blurred vision and pain.  Respiratory: Negative.  Negative for cough, hemoptysis, shortness of breath and wheezing.   Cardiovascular: Negative.  Negative for chest pain, palpitations and leg swelling.  Gastrointestinal: Negative.  Negative for abdominal pain, blood in stool, diarrhea, nausea and vomiting.  Genitourinary: Negative.  Negative for dysuria.  Musculoskeletal: Negative.  Negative for back pain.  Skin: Negative.   Neurological: Negative for dizziness, tremors, speech change, focal weakness, seizures and headaches.   Endo/Heme/Allergies: Negative.  Does not bruise/bleed easily.  Psychiatric/Behavioral: Negative.  Negative for depression, hallucinations and suicidal ideas.     PHYSICAL EXAMINATION:  GENERAL:  71 y.o.-year-old patient lying in the bed with no acute distress.  NECK:  Supple, no jugular venous distention. No thyroid enlargement, no tenderness.  LUNGS: Normal breath sounds bilaterally, no wheezing, rales,rhonchi  No use of accessory muscles of respiration.  CARDIOVASCULAR: Irregular, irregular. No murmurs, rubs, or gallops.  ABDOMEN: Soft, non-tender, non-distended. Bowel sounds present. No organomegaly or mass.  EXTREMITIES: No pedal edema, cyanosis, or clubbing.  PSYCHIATRIC: The patient is alert and oriented x 3.  SKIN: No obvious rash, lesion, or ulcer.   DATA REVIEW:   CBC  Recent Labs Lab 12/09/16 0252  WBC 6.3  HGB 12.4*  HCT 38.1*  PLT 238    Chemistries   Recent Labs Lab 12/08/16 2042 12/09/16 0252  NA 133* 133*  K 3.9 3.3*  CL 98* 95*  CO2 24 26  GLUCOSE 99 118*  BUN 12 13  CREATININE 1.25* 1.20  CALCIUM 9.6 9.4  AST 54*  --   ALT 20  --   ALKPHOS 72  --   BILITOT 1.4*  --     Cardiac Enzymes  Recent Labs Lab 12/08/16 2241 12/09/16 0252 12/09/16 0731  TROPONINI 0.03* 0.04* 0.04*    Microbiology Results  @  RADIOLOGY:  Dg Chest 1 View  Result Date: 12/08/2016 CLINICAL DATA:  Acute onset of shortness of breath. Initial encounter. EXAM: CHEST 1 VIEW COMPARISON:  Chest radiograph performed 03/18/2013 FINDINGS: The lungs are well-aerated. Left basilar atelectasis is noted. There is no evidence of pleural effusion or pneumothorax. The cardiomediastinal silhouette is enlarged. The patient is status post median sternotomy. No acute osseous abnormalities are seen. IMPRESSION: Left basilar atelectasis.  Cardiomegaly. Electronically Signed   By: Roanna Raider M.D.   On: 12/08/2016 20:33      Current Discharge Medication List    START  taking these medications   Details  apixaban (ELIQUIS) 5 MG TABS tablet Take 1 tablet (5 mg total) by mouth 2 (two) times daily. Qty: 60 tablet, Refills: 0      CONTINUE these medications which have CHANGED   Details  metoprolol (LOPRESSOR) 50 MG tablet Take 1 tablet (50 mg total) by mouth 2 (two) times daily. Qty: 60 tablet, Refills: 0      CONTINUE these medications which have NOT CHANGED   Details  furosemide (LASIX) 40 MG tablet TAKE 1 TABLET BY MOUTH TWICE A DAY    gabapentin (NEURONTIN) 400 MG capsule Take 1 capsule (400 mg total) by mouth 3 (three) times daily. Qty: 90 capsule, Refills: 5    Melatonin 5 MG TABS Take 10 mg by mouth at bedtime.    omeprazole (PRILOSEC) 20 MG capsule Take 20 mg by mouth daily.    potassium chloride SA (KLOR-CON M20) 20 MEQ tablet TAKE 1 TABLET (20 MEQ TOTAL)  BY MOUTH ONCE DAILY. AND EXTRA TABLET IF TAKES 2ND TABLET OF LASIX    ramipril (ALTACE) 5 MG capsule TAKE 1 CAPSULE EVERY DAY BY MOUTH    sertraline (ZOLOFT) 100 MG tablet     zolpidem (AMBIEN) 10 MG tablet     GLUCOSAMINE-CHONDROITIN PO Take by mouth.    TURMERIC CURCUMIN PO Take by mouth.          Management plans discussed with the patient and he is in agreement. Stable for discharge home  Patient should follow up with cardiology  CODE STATUS:     Code Status Orders        Start     Ordered   12/08/16 2359  Full code  Continuous     12/08/16 2358    Code Status History    Date Active Date Inactive Code Status Order ID Comments User Context   This patient has a current code status but no historical code status.      TOTAL TIME TAKING CARE OF THIS PATIENT: 38 minutes.    Note: This dictation was prepared with Dragon dictation along with smaller phrase technology. Any transcriptional errors that result from this process are unintentional.  Aidin Doane M.D on 12/09/2016 at 9:41 AM  Between 7am to 6pm - Pager - [phone] After 6pm go to [email or web address] -  password [employer]  [facility]  Office  [phone]  CC: Primary care physician; Jaclyn Shaggy, MD

## 2016-12-09 NOTE — Progress Notes (Signed)
ANTICOAGULATION CONSULT NOTE - Initial Consult  Pharmacy Consult for heparin dosing Indication: atrial fibrillation       Allergies  Allergen Reactions  . Codeine     dizzy    Patient Measurements: Height:  (175.3 cm) Weight: 200 lb (90.7 kg) IBW/kg (Calculated) : 70.7 Heparin Dosing Weight: 89 kg  Vital Signs: Temp: 98.7 F (37.1 C) (03/31 1924) Temp Source: Oral (03/31 1924) BP: 176/96 (03/31 2244) Pulse Rate: 118 (03/31 2244)  Labs:  HL= 0.33  CBC Latest Ref Rng & Units 12/09/2016 12/08/2016 09/07/2013  WBC 3.8 - 10.6 K/uL 6.3 6.7 6.3  Hemoglobin 13.0 - 18.0 g/dL 12.4(L) 12.6(L) 11.7(L)  Hematocrit 40.0 - 52.0 % 38.1(L) 38.8(L) 37.2(L)  Platelets 150 - 440 K/uL 238 245 224       Medical History:     Past Medical History:  Diagnosis Date  . CHF (congestive heart failure) (HCC)   . Chronic kidney disease    congenital one kidney  . Hypertension   . Myocardial infarction     Medications:  Scheduled:  . heparin  4,000 Units Intravenous Once  . metoprolol tartrate  50 mg Oral BID    Assessment: Patient admitted for SOB w/ h/o CHF EF 30%, HTN, CAD s/p CABG and in afib. Pharmacy is consulted for heparin drip for afib  Goal of Therapy:  Heparin level 0.3-0.7 units/ml Monitor platelets by anticoagulation protocol: Yes   Plan:  Continue heparin infusion at 1000 units/hr and check a confirmatory level in 6 hours.   Luisa Hart, PharmD Clinical Pharmacist  12/09/2016

## 2016-12-09 NOTE — Progress Notes (Addendum)
Notified Dr. Anne Hahn of K level of 3.3 and troponin increase from 0.03 to 0.04.  To order PO dose of potassium. Patient already on heparin drip.

## 2016-12-09 NOTE — Progress Notes (Signed)
Patient given discharge teaching and paperwork regarding medications, diet, follow-up appointments and activity. Patient understanding verbalized. No complaints at this time. IV and telemetry discontinued prior to leaving. Skin assessment as previously charted and vitals are stable; on room air. Patient being discharged to home. Caregiver/family present during discharge teaching. No further needs by Care Management - coupon for eliquis given. Prescriptions given to patient.

## 2016-12-09 NOTE — Progress Notes (Signed)
[facility] - [place] at [facility]   PATIENT NAME: Danny Marquez    MR#:  [id number]  DATE OF BIRTH:  1946/01/30  SUBJECTIVE:   Patient here with new onset atrial fib  REVIEW OF SYSTEMS:    Review of Systems  Constitutional: Negative.  Negative for chills, fever and malaise/fatigue.  HENT: Negative.  Negative for ear discharge, ear pain, hearing loss, nosebleeds and sore throat.   Eyes: Negative.  Negative for blurred vision and pain.  Respiratory: Negative.  Negative for cough, hemoptysis, shortness of breath (improved) and wheezing.   Cardiovascular: Negative for chest pain, palpitations and leg swelling.  Gastrointestinal: Negative.  Negative for abdominal pain, blood in stool, diarrhea, nausea and vomiting.  Genitourinary: Negative.  Negative for dysuria.  Musculoskeletal: Negative.  Negative for back pain.  Skin: Negative.   Neurological: Negative for dizziness, tremors, speech change, focal weakness, seizures and headaches.  Endo/Heme/Allergies: Negative.  Does not bruise/bleed easily.  Psychiatric/Behavioral: Negative.  Negative for depression, hallucinations and suicidal ideas.    Tolerating Diet:yes      DRUG ALLERGIES:   Allergies  Allergen Reactions  . Codeine     dizzy    VITALS:  Blood pressure (!) 133/93, pulse 81, temperature 98.3 F (36.8 C), temperature source Oral, resp. rate 18, height  (1.753 m), weight 90.5 kg (199 lb 8 oz), SpO2 97 %.  PHYSICAL EXAMINATION:   Physical Exam  Constitutional: He is oriented to person, place, and time and well-developed, well-nourished, and in no distress. No distress.  HENT:  Head: Normocephalic.  Eyes: No scleral icterus.  Neck: Normal range of motion. Neck supple. No JVD present. No tracheal deviation present.  Cardiovascular: Normal heart sounds.  Exam reveals no gallop and no friction rub.   No murmur heard. irr, irr  Pulmonary/Chest: Effort normal and breath sounds normal. No  respiratory distress. He has no wheezes. He has no rales. He exhibits no tenderness.  Abdominal: Soft. Bowel sounds are normal. He exhibits no distension and no mass. There is no tenderness. There is no rebound and no guarding.  Musculoskeletal: Normal range of motion. He exhibits no edema.  Neurological: He is alert and oriented to person, place, and time.  Skin: Skin is warm. No rash noted. No erythema.  Psychiatric: Affect and judgment normal.      LABORATORY PANEL:   CBC  Recent Labs Lab 12/09/16 0252  WBC 6.3  HGB 12.4*  HCT 38.1*  PLT 238   ------------------------------------------------------------------------------------------------------------------  Chemistries   Recent Labs Lab 12/08/16 2042 12/09/16 0252  NA 133* 133*  K 3.9 3.3*  CL 98* 95*  CO2 24 26  GLUCOSE 99 118*  BUN 12 13  CREATININE 1.25* 1.20  CALCIUM 9.6 9.4  AST 54*  --   ALT 20  --   ALKPHOS 72  --   BILITOT 1.4*  --    ------------------------------------------------------------------------------------------------------------------  Cardiac Enzymes  Recent Labs Lab 12/08/16 2241 12/09/16 0252 12/09/16 0731  TROPONINI 0.03* 0.04* 0.04*   ------------------------------------------------------------------------------------------------------------------  RADIOLOGY:  Dg Chest 1 View  Result Date: 12/08/2016 CLINICAL DATA:  Acute onset of shortness of breath. Initial encounter. EXAM: CHEST 1 VIEW COMPARISON:  Chest radiograph performed 03/18/2013 FINDINGS: The lungs are well-aerated. Left basilar atelectasis is noted. There is no evidence of pleural effusion or pneumothorax. The cardiomediastinal silhouette is enlarged. The patient is status post median sternotomy. No acute osseous abnormalities are seen. IMPRESSION: Left basilar atelectasis.  Cardiomegaly. Electronically Signed   By: Leotis Shames  Chang M.D.   On: 12/08/2016 20:33     ASSESSMENT AND PLAN:   71 year old male with a  history of chronic systolic heart failure ejection fraction 30%, congenital kidney and CAD who presents with shortness of breath and found to have new onset atrial fibrillation.   1. Atrial fibrillation with RVR, new: Patient's heart rate is better controlled. Follow up on echocardiogram Continue heparin drip and if echocardiogram shows no major valvular abnormalities then can be transitioned to NOAC TSH 2.3 Continue metoprolol for heart rate control Consider adding diltiazem if heart rate uncontrolled Follow up on cardiology consultation  2. Chronic systolic heart failure without exacerbation: Continue metoprolol and ramipril and Lasix Follow up on echocardiogram  3. Depression: Continue Zoloft  Management plans discussed with the patient and he is in agreement.  CODE STATUS: full  TOTAL TIME TAKING CARE OF THIS PATIENT: 30 minutes.     POSSIBLE D/C today, DEPENDING ON CLINICAL CONDITION.   Byrl Latin M.D on 12/09/2016 at 9:19 AM  Between 7am to 6pm - Pager - [phone] After 6pm go to [email or web address] - password [employer]  [facility]  Office  [phone]  CC: Primary care physician; Jaclyn Shaggy, MD  Note: This dictation was prepared with Dragon dictation along with smaller phrase technology. Any transcriptional errors that result from this process are unintentional.

## 2016-12-09 NOTE — Progress Notes (Signed)
ANTICOAGULATION CONSULT NOTE - Initial Consult  Pharmacy Consult for heparin dosing; now transitioning to apixaban Indication: atrial fibrillation       Allergies  Allergen Reactions  . Codeine     dizzy    Patient Measurements: Height:  (175.3 cm) Weight: 200 lb (90.7 kg) IBW/kg (Calculated) : 70.7 Heparin Dosing Weight: 89 kg  Vital Signs: Temp: 98.7 F (37.1 C) (03/31 1924) Temp Source: Oral (03/31 1924) BP: 176/96 (03/31 2244) Pulse Rate: 118 (03/31 2244)  Labs:  HL= 0.33  CBC Latest Ref Rng & Units 12/09/2016 12/08/2016 09/07/2013  WBC 3.8 - 10.6 K/uL 6.3 6.7 6.3  Hemoglobin 13.0 - 18.0 g/dL 12.4(L) 12.6(L) 11.7(L)  Hematocrit 40.0 - 52.0 % 38.1(L) 38.8(L) 37.2(L)  Platelets 150 - 440 K/uL 238 245 224       Medical History:     Past Medical History:  Diagnosis Date  . CHF (congestive heart failure) (HCC)   . Chronic kidney disease    congenital one kidney  . Hypertension   . Myocardial infarction     Medications:  Scheduled:  . heparin  4,000 Units Intravenous Once  . metoprolol tartrate  50 mg Oral BID    Assessment: Patient admitted for SOB w/ h/o CHF EF 30%, HTN, CAD s/p CABG and in afib. Pharmacy is consulted for heparin drip for afib, now to transition to apixaban   Plan:   Discontinue heparin drip, initiate apixaban  PO BID when drip is stopped. Patient and family educated by Rella Larve, PharmD  Garlon Hatchet, PharmD, BCPS Clinical Pharmacist  12/09/2016 11:43 AM

## 2017-01-15 ENCOUNTER — Other Ambulatory Visit: Payer: Self-pay | Admitting: Cardiology

## 2017-01-23 ENCOUNTER — Encounter
Admission: RE | Disposition: A | Discharge status: Home / Self Care | Payer: Self-pay | Source: Ambulatory Visit | Attending: Cardiology

## 2017-01-23 ENCOUNTER — Ambulatory Visit: Payer: Medicare Other | Admitting: Anesthesiology

## 2017-01-23 ENCOUNTER — Encounter: Payer: Self-pay | Admitting: Cardiology

## 2017-01-23 ENCOUNTER — Ambulatory Visit
Admission: RE | Admit: 2017-01-23 | Discharge: 2017-01-23 | Disposition: A | Discharge status: Home / Self Care | Payer: Medicare Other | Source: Ambulatory Visit | Attending: Cardiology | Admitting: Cardiology

## 2017-01-23 DIAGNOSIS — I4891 Unspecified atrial fibrillation: Secondary | ICD-10-CM | POA: Insufficient documentation

## 2017-01-23 DIAGNOSIS — I110 Hypertensive heart disease with heart failure: Secondary | ICD-10-CM | POA: Insufficient documentation

## 2017-01-23 DIAGNOSIS — Z7901 Long term (current) use of anticoagulants: Secondary | ICD-10-CM | POA: Insufficient documentation

## 2017-01-23 DIAGNOSIS — M5440 Lumbago with sciatica, unspecified side: Secondary | ICD-10-CM | POA: Insufficient documentation

## 2017-01-23 DIAGNOSIS — Z8249 Family history of ischemic heart disease and other diseases of the circulatory system: Secondary | ICD-10-CM | POA: Insufficient documentation

## 2017-01-23 DIAGNOSIS — E7800 Pure hypercholesterolemia, unspecified: Secondary | ICD-10-CM | POA: Diagnosis not present

## 2017-01-23 DIAGNOSIS — Z87891 Personal history of nicotine dependence: Secondary | ICD-10-CM | POA: Diagnosis not present

## 2017-01-23 DIAGNOSIS — K219 Gastro-esophageal reflux disease without esophagitis: Secondary | ICD-10-CM | POA: Diagnosis not present

## 2017-01-23 DIAGNOSIS — I252 Old myocardial infarction: Secondary | ICD-10-CM | POA: Insufficient documentation

## 2017-01-23 DIAGNOSIS — Z905 Acquired absence of kidney: Secondary | ICD-10-CM | POA: Diagnosis not present

## 2017-01-23 DIAGNOSIS — I5022 Chronic systolic (congestive) heart failure: Secondary | ICD-10-CM | POA: Insufficient documentation

## 2017-01-23 DIAGNOSIS — F329 Major depressive disorder, single episode, unspecified: Secondary | ICD-10-CM | POA: Diagnosis not present

## 2017-01-23 DIAGNOSIS — I25810 Atherosclerosis of coronary artery bypass graft(s) without angina pectoris: Secondary | ICD-10-CM | POA: Diagnosis not present

## 2017-01-23 DIAGNOSIS — Z79899 Other long term (current) drug therapy: Secondary | ICD-10-CM | POA: Insufficient documentation

## 2017-01-23 HISTORY — PX: CARDIOVERSION: EP1203

## 2017-01-23 SURGERY — CARDIOVERSION (CATH LAB)
Anesthesia: Choice

## 2017-01-23 SURGERY — CARDIOVERSION (CATH LAB)
Anesthesia: General

## 2017-01-23 MED ORDER — HYDROCORTISONE 1 % EX CREA: 1.0000 "application " | TOPICAL_CREAM | Freq: Three times a day (TID) | CUTANEOUS | Status: DC | PRN

## 2017-01-23 MED ORDER — SODIUM CHLORIDE 0.9% FLUSH: 3.0000 mL | Freq: Two times a day (BID) | INTRAVENOUS | Status: DC

## 2017-01-23 MED ORDER — PROPOFOL 10 MG/ML IV BOLUS
INTRAVENOUS | Status: AC
  Filled 2017-01-23: qty 40

## 2017-01-23 MED ORDER — MIDAZOLAM HCL 2 MG/2ML IJ SOLN
INTRAMUSCULAR | Status: AC
  Filled 2017-01-23: qty 2

## 2017-01-23 MED ORDER — FENTANYL CITRATE (PF) 100 MCG/2ML IJ SOLN
INTRAMUSCULAR | Status: AC
  Filled 2017-01-23: qty 2

## 2017-01-23 MED ORDER — LIDOCAINE HCL (CARDIAC) 20 MG/ML IV SOLN
INTRAVENOUS | Status: DC | PRN
  Administered 2017-01-23: 40 mg via INTRATRACHEAL

## 2017-01-23 MED ORDER — LIDOCAINE HCL 2 % IJ SOLN
INTRAMUSCULAR | Status: AC
  Filled 2017-01-23: qty 10

## 2017-01-23 MED ORDER — SODIUM CHLORIDE 0.9 % IV SOLN
250.0000 mL | INTRAVENOUS | Status: DC
  Administered 2017-01-23: 07:00:00 via INTRAVENOUS

## 2017-01-23 MED ORDER — PROPOFOL 10 MG/ML IV BOLUS
INTRAVENOUS | Status: DC | PRN
  Administered 2017-01-23: 20 mg via INTRAVENOUS
  Administered 2017-01-23: 30 mg via INTRAVENOUS

## 2017-01-23 MED ORDER — SODIUM CHLORIDE 0.9% FLUSH: 3.0000 mL | INTRAVENOUS | Status: DC | PRN

## 2017-01-23 NOTE — Anesthesia Post-op Follow-up Note (Cosign Needed)
Anesthesia QCDR form completed.        

## 2017-01-23 NOTE — Anesthesia Preprocedure Evaluation (Signed)
Anesthesia Evaluation  Patient identified by MRN, date of birth, ID band Patient awake    Reviewed: Allergy & Precautions, NPO status , Patient's Chart, lab work & pertinent test results  History of Anesthesia Complications Negative for: history of anesthetic complications  Airway Mallampati: III       Dental  (+) Partial Lower, Partial Upper   Pulmonary neg pulmonary ROS, former smoker,           Cardiovascular hypertension, Pt. on medications and Pt. on home beta blockers + Past MI, + CABG and +CHF  + dysrhythmias Atrial Fibrillation      Neuro/Psych negative neurological ROS     GI/Hepatic negative GI ROS, Neg liver ROS,   Endo/Other  negative endocrine ROS  Renal/GU Renal disease (single kidney)     Musculoskeletal   Abdominal   Peds  Hematology   Anesthesia Other Findings   Reproductive/Obstetrics                             Anesthesia Physical Anesthesia Plan  ASA: III  Anesthesia Plan: General   Post-op Pain Management:    Induction: Intravenous  Airway Management Planned: Nasal Cannula  Additional Equipment:   Intra-op Plan:   Post-operative Plan:   Informed Consent: I have reviewed the patients History and Physical, chart, labs and discussed the procedure including the risks, benefits and alternatives for the proposed anesthesia with the patient or authorized representative who has indicated his/her understanding and acceptance.     Plan Discussed with:   Anesthesia Plan Comments:         Anesthesia Quick Evaluation

## 2017-01-23 NOTE — Anesthesia Postprocedure Evaluation (Signed)
Anesthesia Post Note  Patient: Danny Marquez  Procedure(s) Performed: Procedure(s) (LRB): CARDIOVERSION (N/A)  Patient location during evaluation: Cath Lab Anesthesia Type: General Level of consciousness: awake, oriented and patient cooperative Pain management: pain level controlled Vital Signs Assessment: post-procedure vital signs reviewed and stable Respiratory status: spontaneous breathing and patient connected to nasal cannula oxygen Cardiovascular status: stable and blood pressure returned to baseline Anesthetic complications: no     Last Vitals:  Vitals:   01/23/17 0734 01/23/17 0735  BP: 134/74 131/87  Pulse: 62 (!) 107  Resp: 18 19  Temp:      Last Pain:  Vitals:   01/23/17 0655  TempSrc: Campbell Stallral                 Brailey Buescher,  Siddhartha Hoback A

## 2017-01-23 NOTE — Procedures (Signed)
Electrical Cardioversion Procedure Note Danny ChampagneRichard Cully Marquez [id number] Jun 29, 1946  Procedure: Electrical Cardioversion Indications:  Atrial Fibrillation  Procedure Details Consent: Risks of procedure as well as the alternatives and risks of each were explained to the (patient/caregiver).  Consent for procedure obtained. Time Out: Verified patient identification, verified procedure, site/side was marked, verified correct patient position, special equipment/implants available, medications/allergies/relevent history reviewed, required imaging and test results available.  Performed  Patient placed on cardiac monitor, pulse oximetry, supplemental oxygen as necessary.  Sedation given: propofol Pacer pads placed anterior and posterior chest.  Cardioverted 1 time(s).  Cardioverted at 150J.  Evaluation Findings: Post procedure EKG shows: NSR Complications: No immediate complications. Pt has nsr with intermitant afib.  Patient did tolerate procedure well.   Danny HeadingKenneth A Dylin Marquez 01/23/2017, 7:41 AM

## 2017-01-23 NOTE — H&P (Signed)
Chief Complaint: Chief Complaint  Patient presents with  . Follow-up  Echo  . Palpitations  yesterday was the first time he felt them, no energy, felt like a flutter  Date of Service: 01/14/2017 Date of Birth: Nov 15, 1945 PCP: Jaclyn Shaggy, MD  History of Present Illness: [name] is a 71 y.o.male patient who returns for follow-up visit. He was recently noted to be in atrial fibrillation. Rate has been in fairly good control. He was started on Eliquis and has been tolerating this fairly well. He states that he feels a lot more fatigued and short of breath since he has been in atrial fibrillation. He wore a Holter monitor which revealed atrial fibrillation with controlled ventricular response. His echocardiogram showed reduced LV function at 30%. This is unchanged from his baseline. He has a history of coronary artery disease status post coronary artery bypass grafting. His chads 2 vasc score is 4 he denies orthopnea or PND. He has difficulty ambulating due to shortness of breath and fatigue. Past Medical and Surgical History  Past Medical History Past Medical History:  Diagnosis Date  . Alcoholism (CMS-HCC)  . Atrial fibrillation , unspecified (CMS-HCC)  . CHF (congestive heart failure) (CMS-HCC)  . Depression, unspecified  . GERD (gastroesophageal reflux disease)  . History of pneumonia 07/2002  with ARDS requiring intubation.  . Hyperlipidemia, unspecified  . Hypertension  . Myocardial infarction (CMS-HCC) 2009  . Pancreatitis  with peusdocyst  . Solitary kidney  . Spleen hematoma   Past Surgical History He has a past surgical history that includes Colonoscopy (07/02/03; 09/07/13); egd (07/02/03; 09/07/13); Knee arthroscopy (Right, 11/01/03); Knee arthroscopy (Left, 06/21/11); Joint replacement (09/12/12); Coronary artery bypass graft (09/13/06); and disk fusion (Right).   Medications and Allergies  Current Medications  Current Outpatient Prescriptions  Medication Sig Dispense  Refill  . apixaban (ELIQUIS) 5 mg tablet Take 1 tablet (5 mg total) by mouth every 12 (twelve) hours. 60 tablet 0  . FUROsemide (LASIX) 40 MG tablet TAKE 1 TABLET BY MOUTH TWICE A DAY (Patient taking differently: TAKE 1 TABLET BY MOUTH TWICE A DAY; Patient is taking this once a day.) 60 tablet 3  . gabapentin (NEURONTIN) 400 MG capsule Take 400 mg by mouth 2 (two) times daily.  Marland Kitchen KLOR-CON M20 20 mEq ER tablet TAKE 1 TABLET (20 MEQ TOTAL) BY MOUTH ONCE DAILY. AND EXTRA TABLET IF TAKES 2ND TABLET OF LASIX 90 tablet 3  . melatonin 10 mg Tab Take 10 mg by mouth once daily.  . metoprolol tartrate (LOPRESSOR) 50 MG tablet Take 1 tablet (50 mg total) by mouth 2 (two) times daily. 60 tablet 6  . omeprazole (PRILOSEC OTC) 20 MG tablet Take 20 mg by mouth once daily.  . sertraline (ZOLOFT) 100 MG tablet Take 100 mg by mouth once daily.  Marland Kitchen zolpidem (AMBIEN) 10 mg tablet Take 10 mg by mouth nightly.   . AMIOdarone (PACERONE) 200 MG tablet Take 2 tablets (400 mg total) by mouth 2 (two) times daily. 120 tablet 11   No current facility-administered medications for this visit.   Allergies: Codeine and Crestor [rosuvastatin]  Social and Family History  Social History reports that he quit smoking about 15 years ago. He has never used smokeless tobacco. He reports that he does not use drugs.  Family History Family History  Problem Relation Age of Onset  . Diabetes type II Father  . Coronary Artery Disease (Blocked arteries around heart) Father  . Myocardial Infarction (Heart attack) Father  Review of Systems  Review of Systems  Constitutional: Negative for chills, diaphoresis, fever, malaise/fatigue and weight loss.  HENT: Negative for congestion, ear discharge, hearing loss and tinnitus.  Eyes: Negative for blurred vision.  Respiratory: Negative for cough, hemoptysis, sputum production, shortness of breath and wheezing.  Cardiovascular: Negative for chest pain, palpitations, orthopnea, claudication,  leg swelling and PND.  Gastrointestinal: Negative for abdominal pain, blood in stool, constipation, diarrhea, heartburn, melena, nausea and vomiting.  Genitourinary: Negative for dysuria, frequency, hematuria and urgency.  Musculoskeletal: Negative for falls and myalgias.  Skin: Negative for itching and rash.  Neurological: Negative for dizziness, tingling, focal weakness, loss of consciousness, weakness and headaches.  Endo/Heme/Allergies: Negative for polydipsia. Does not bruise/bleed easily.  Psychiatric/Behavioral: Negative for depression, memory loss and substance abuse. The patient is not nervous/anxious.    Physical Examination   Vitals: BP 132/80  Pulse (!) 111  Ht 175.3 cm (5\' 9" )  Wt 92.5 kg (204 lb)  SpO2 98%  BMI 30.13 kg/m  Ht:175.3 cm (5\' 9" ) Wt:92.5 kg (204 lb) ZOX:WRUE surface area is 2.12 meters squared. Body mass index is 30.13 kg/m.  Wt Readings from Last 3 Encounters:  01/14/17 92.5 kg (204 lb)  12/31/16 91.8 kg (202 lb 6.4 oz)  12/17/16 91 kg (200 lb 9.6 oz)   BP Readings from Last 3 Encounters:  01/14/17 132/80  12/31/16 110/60  12/17/16 90/60  general: Caucasian male in no acute distress  LUNGS Breath Sounds: Normal Percussion: Normal  CARDIOVASCULAR JVP CV wave: no HJR: no Elevation at 90 degrees: None Carotid Pulse: normal pulsation bilaterally Bruit: None Apex: apical impulse normal  Auscultation Rhythm: atrial fibrillation S1: normal S2: normal Clicks: no Rub: no Murmurs: no murmurs  Gallop: None ABDOMEN Liver enlargement: no Pulsatile aorta: no Ascites: no Bruits: no  EXTREMITIES Clubbing: no Edema: trace to 1+ bilateral pedal edema Pulses: peripheral pulses symmetrical Femoral Bruits: no Amputation: no SKIN Rash: no Cyanosis: no Embolic phemonenon: no Bruising: no NEURO Alert and Oriented to person, place and time: yes Non focal: yes LABS Last 3 CBC results: Lab Results  Component Value Date  WBC 7.9  09/18/2006  WBC 8.5 09/17/2006  WBC 9.6 09/16/2006   Lab Results  Component Value Date  HGB 9.1 (L) 09/18/2006  HGB 9.3 (L) 09/17/2006  HGB 8.9 (L) 09/16/2006   Lab Results  Component Value Date  HCT 0.28 (L) 09/18/2006  HCT 0.27 (L) 09/17/2006  HCT 0.28 (L) 09/16/2006   Lab Results  Component Value Date  PLT 298 09/18/2006  PLT 246 09/17/2006  PLT 195 09/16/2006   Lab Results  Component Value Date  CREATININE 1.1 09/18/2006  BUN 19 09/18/2006  NA 136 09/18/2006  K 3.7 09/18/2006  CL 99 09/18/2006  CO2 30 09/18/2006   Lab Results  Component Value Date  HGBA1C 6.0 09/12/2006   Lab Results  Component Value Date  ALT 25 09/12/2006  AST 45 09/12/2006  ALKPHOS 88 09/12/2006   Assessment and Plan   71 y.o. male with  ICD-10-CM ICD-9-CM  1. CAD of autologous bypass graft-no evidence clinically of ischemia or by EKG. functional study done last year revealed no ischemia with fixed septal defect EF 30-35%. . He does not appear ischemic at present. I25.810 414.02  2. Low back pain, unspecified back pain laterality, with sciatica presence unspecified-Stable at present recovering from back surgery M54.5 724.2  3. Chronic systolic congestive heart failure-no clinical evidence of heart failure. Will continue with ACE-inhibitor and beta-blocker and furosemide for  symptom relief. I50.22 428.22  428.0  4. Pure hypercholesterolemia-Will continue with Crestor with an LDL goal of less than 100. E78.0 272.0  5. Essential hypertension-continue with lisinopril metoprolol was well as a dash diet. I10 401.9   6. Atrial fibrillation-patient appears to be well controlled and is currently anticoagulated. We discussed at length treatment for this including rate control and anticoagulation, cardioversion after loading with an antiarrhythmic agent versus ablation. We will load with amiodarone orally at 400 twice daily for 1 week followed by attempted cardioversion. If he does not convert would  complete the load and repeat cardioversion after full load. If this is not effective, consideration for ablation could be raised.  Return in about 3 weeks (around 02/04/2017).  These notes generated with voice recognition software. I apologize for typographical errors.  Denton ArKENNETH ALAN Loraine Freid, MD    Pt seen and examined. No change from above

## 2017-01-23 NOTE — Transfer of Care (Signed)
Immediate Anesthesia Transfer of Care Note  Patient: Danny Marquez  Procedure(s) Performed: Procedure(s): CARDIOVERSION (N/A)  Patient Location: Radiology  Anesthesia Type:General  Level of Consciousness: oriented, drowsy and patient cooperative  Airway & Oxygen Therapy: Patient Spontanous Breathing and Patient connected to nasal cannula oxygen  Post-op Assessment: Report given to RN, Post -op Vital signs reviewed and stable and Patient moving all extremities X 4  Post vital signs: Reviewed and stable  Last Vitals:  Vitals:   01/23/17 0734 01/23/17 0735  BP: 134/74 131/87  Pulse: 62 (!) 107  Resp: 18 19  Temp:      Last Pain:  Vitals:   01/23/17 0655  TempSrc: Oral         Complications: No apparent anesthesia complications

## 2017-01-29 ENCOUNTER — Other Ambulatory Visit: Payer: Self-pay | Admitting: Cardiology

## 2017-02-01 ENCOUNTER — Encounter
Admission: RE | Disposition: A | Discharge status: Home / Self Care | Payer: Self-pay | Source: Ambulatory Visit | Attending: Cardiology

## 2017-02-01 ENCOUNTER — Ambulatory Visit
Admission: RE | Admit: 2017-02-01 | Discharge: 2017-02-01 | Disposition: A | Discharge status: Home / Self Care | Payer: Medicare Other | Source: Ambulatory Visit | Attending: Cardiology | Admitting: Cardiology

## 2017-02-01 ENCOUNTER — Ambulatory Visit: Payer: Medicare Other | Admitting: Anesthesiology

## 2017-02-01 ENCOUNTER — Encounter: Payer: Self-pay | Admitting: Anesthesiology

## 2017-02-01 ENCOUNTER — Other Ambulatory Visit: Payer: Self-pay | Admitting: Cardiology

## 2017-02-01 DIAGNOSIS — Z539 Procedure and treatment not carried out, unspecified reason: Secondary | ICD-10-CM | POA: Diagnosis not present

## 2017-02-01 DIAGNOSIS — F329 Major depressive disorder, single episode, unspecified: Secondary | ICD-10-CM | POA: Insufficient documentation

## 2017-02-01 DIAGNOSIS — Z8249 Family history of ischemic heart disease and other diseases of the circulatory system: Secondary | ICD-10-CM | POA: Insufficient documentation

## 2017-02-01 DIAGNOSIS — Z7901 Long term (current) use of anticoagulants: Secondary | ICD-10-CM | POA: Diagnosis not present

## 2017-02-01 DIAGNOSIS — I110 Hypertensive heart disease with heart failure: Secondary | ICD-10-CM | POA: Insufficient documentation

## 2017-02-01 DIAGNOSIS — M545 Low back pain: Secondary | ICD-10-CM | POA: Insufficient documentation

## 2017-02-01 DIAGNOSIS — Q600 Renal agenesis, unilateral: Secondary | ICD-10-CM | POA: Insufficient documentation

## 2017-02-01 DIAGNOSIS — I25810 Atherosclerosis of coronary artery bypass graft(s) without angina pectoris: Secondary | ICD-10-CM | POA: Diagnosis not present

## 2017-02-01 DIAGNOSIS — R110 Nausea: Secondary | ICD-10-CM | POA: Insufficient documentation

## 2017-02-01 DIAGNOSIS — E7800 Pure hypercholesterolemia, unspecified: Secondary | ICD-10-CM | POA: Diagnosis not present

## 2017-02-01 DIAGNOSIS — K219 Gastro-esophageal reflux disease without esophagitis: Secondary | ICD-10-CM | POA: Diagnosis not present

## 2017-02-01 DIAGNOSIS — I4891 Unspecified atrial fibrillation: Secondary | ICD-10-CM | POA: Insufficient documentation

## 2017-02-01 DIAGNOSIS — I5022 Chronic systolic (congestive) heart failure: Secondary | ICD-10-CM | POA: Insufficient documentation

## 2017-02-01 DIAGNOSIS — I252 Old myocardial infarction: Secondary | ICD-10-CM | POA: Insufficient documentation

## 2017-02-01 DIAGNOSIS — Z87891 Personal history of nicotine dependence: Secondary | ICD-10-CM | POA: Insufficient documentation

## 2017-02-01 DIAGNOSIS — Z79899 Other long term (current) drug therapy: Secondary | ICD-10-CM | POA: Diagnosis not present

## 2017-02-01 HISTORY — PX: CARDIOVERSION: EP1203

## 2017-02-01 SURGERY — CARDIOVERSION (CATH LAB)
Anesthesia: Choice

## 2017-02-01 SURGERY — CARDIOVERSION (CATH LAB)
Anesthesia: General

## 2017-02-01 MED ORDER — HYDROCORTISONE 1 % EX CREA
1.0000 "application " | TOPICAL_CREAM | Freq: Three times a day (TID) | CUTANEOUS | Status: DC | PRN
  Filled 2017-02-01: qty 28

## 2017-02-01 MED ORDER — HYDROCORTISONE 1 % EX CREA: 1.0000 "application " | TOPICAL_CREAM | Freq: Three times a day (TID) | CUTANEOUS | Status: DC | PRN

## 2017-02-01 MED ORDER — SODIUM CHLORIDE 0.9 % IV SOLN
250.0000 mL | INTRAVENOUS | Status: DC
  Administered 2017-02-01: 09:00:00 via INTRAVENOUS

## 2017-02-01 MED ORDER — PROPOFOL 10 MG/ML IV BOLUS
INTRAVENOUS | Status: DC | PRN
  Administered 2017-02-01: 80 mg via INTRAVENOUS

## 2017-02-01 MED ORDER — PROPOFOL 10 MG/ML IV BOLUS
INTRAVENOUS | Status: AC
  Filled 2017-02-01: qty 20

## 2017-02-01 MED ORDER — SODIUM CHLORIDE 0.9% FLUSH: 3.0000 mL | Freq: Two times a day (BID) | INTRAVENOUS | Status: DC

## 2017-02-01 MED ORDER — SODIUM CHLORIDE 0.9 % IV SOLN: 250.0000 mL | INTRAVENOUS | Status: DC

## 2017-02-01 MED ORDER — SODIUM CHLORIDE 0.9% FLUSH: 3.0000 mL | INTRAVENOUS | Status: DC | PRN

## 2017-02-01 NOTE — H&P (Signed)
Chief Complaint: Chief Complaint  Patient presents with  . Follow-up  cardioversion Im no good having the same issues  . Nausea  ever since the amiodarone did have allittle with the blood thinner  . other  not taking the sertaline for quite a while now  Date of Service: 01/28/2017 Date of Birth: 1945/11/02 PCP: Jaclyn Shaggy, MD  History of Present Illness: Danny Marquez is a 71 y.o.male patient who returns for follow-up visit. Patient has an ejection fraction of 30% with no reversible ischemia. He is status post coronary bypass grafting. He was begun on a load of amiodarone to 400 twice daily and underwent cardioversion attempt at 2 weeks at his request. He converted to sinus rhythm transiently but now is back in A. fib. He is very symptomatic from this. We will continue with amiodarone and reattempt cardioversion. If not effective, will refer for consideration for ablation. Past Medical and Surgical History  Past Medical History Past Medical History:  Diagnosis Date  . Alcoholism (CMS-HCC)  . Atrial fibrillation , unspecified (CMS-HCC)  . CHF (congestive heart failure) (CMS-HCC)  . Depression, unspecified  . GERD (gastroesophageal reflux disease)  . History of pneumonia 07/2002  with ARDS requiring intubation.  . Hyperlipidemia, unspecified  . Hypertension  . Myocardial infarction (CMS-HCC) 2009  . Pancreatitis  with peusdocyst  . Solitary kidney  . Spleen hematoma   Past Surgical History He has a past surgical history that includes Colonoscopy (07/02/03; 09/07/13); egd (07/02/03; 09/07/13); Knee arthroscopy (Right, 11/01/03); Knee arthroscopy (Left, 06/21/11); Joint replacement (09/12/12); Coronary artery bypass graft (09/13/06); and disk fusion (Right).   Medications and Allergies  Current Medications  Current Outpatient Prescriptions  Medication Sig Dispense Refill  . AMIOdarone (PACERONE) 200 MG tablet Take 2 tablets (400 mg total) by mouth 2 (two) times daily. 120 tablet 11  .  apixaban (ELIQUIS) 5 mg tablet Take 1 tablet (5 mg total) by mouth every 12 (twelve) hours. 60 tablet 0  . FUROsemide (LASIX) 40 MG tablet TAKE 1 TABLET BY MOUTH TWICE A DAY (Patient taking differently: TAKE 1 TABLET BY MOUTH TWICE A DAY; Patient is taking this once a day.) 60 tablet 3  . gabapentin (NEURONTIN) 400 MG capsule Take 400 mg by mouth 2 (two) times daily.  Marland Kitchen KLOR-CON M20 20 mEq ER tablet TAKE 1 TABLET (20 MEQ TOTAL) BY MOUTH ONCE DAILY. AND EXTRA TABLET IF TAKES 2ND TABLET OF LASIX 90 tablet 3  . melatonin 10 mg Tab Take 10 mg by mouth once daily.  Marland Kitchen omeprazole (PRILOSEC OTC) 20 MG tablet Take 20 mg by mouth once daily.  Marland Kitchen zolpidem (AMBIEN) 10 mg tablet Take 10 mg by mouth nightly.   . sertraline (ZOLOFT) 100 MG tablet Take 100 mg by mouth once daily.   No current facility-administered medications for this visit.   Allergies: Codeine and Crestor [rosuvastatin]  Social and Family History  Social History reports that he quit smoking about 15 years ago. He has never used smokeless tobacco. He reports that he does not use drugs.  Family History Family History  Problem Relation Age of Onset  . Diabetes type II Father  . Coronary Artery Disease (Blocked arteries around heart) Father  . Myocardial Infarction (Heart attack) Father   Review of Systems  Review of Systems  Constitutional: Negative for chills, diaphoresis, fever, malaise/fatigue and weight loss.  HENT: Negative for congestion, ear discharge, hearing loss and tinnitus.  Eyes: Negative for blurred vision.  Respiratory: Negative for cough, hemoptysis, sputum  production, shortness of breath and wheezing.  Cardiovascular: Negative for chest pain, palpitations, orthopnea, claudication, leg swelling and PND.  Gastrointestinal: Negative for abdominal pain, blood in stool, constipation, diarrhea, heartburn, melena, nausea and vomiting.  Genitourinary: Negative for dysuria, frequency, hematuria and urgency.  Musculoskeletal:  Negative for falls and myalgias.  Skin: Negative for itching and rash.  Neurological: Negative for dizziness, tingling, focal weakness, loss of consciousness, weakness and headaches.  Endo/Heme/Allergies: Negative for polydipsia. Does not bruise/bleed easily.  Psychiatric/Behavioral: Negative for depression, memory loss and substance abuse. The patient is not nervous/anxious.    Physical Examination   Vitals: BP (!) 132/90  Pulse 74  Resp 12  Ht 175.3 cm (5\' 9" )  Wt 92.8 kg (204 lb 9.6 oz)  BMI 30.21 kg/m  Ht:175.3 cm (5\' 9" ) Wt:92.8 kg (204 lb 9.6 oz) ZOX:WRUEBSA:Body surface area is 2.13 meters squared. Body mass index is 30.21 kg/m.  Wt Readings from Last 3 Encounters:  01/28/17 92.8 kg (204 lb 9.6 oz)  01/14/17 92.5 kg (204 lb)  12/31/16 91.8 kg (202 lb 6.4 oz)   BP Readings from Last 3 Encounters:  01/28/17 (!) 132/90  01/14/17 132/80  12/31/16 110/60  general: Caucasian male in no acute distress  LUNGS Breath Sounds: Normal Percussion: Normal  CARDIOVASCULAR JVP CV wave: no HJR: no Elevation at 90 degrees: None Carotid Pulse: normal pulsation bilaterally Bruit: None Apex: apical impulse normal  Auscultation Rhythm: atrial fibrillation S1: normal S2: normal Clicks: no Rub: no Murmurs: no murmurs  Gallop: None ABDOMEN Liver enlargement: no Pulsatile aorta: no Ascites: no Bruits: no  EXTREMITIES Clubbing: no Edema: trace to 1+ bilateral pedal edema Pulses: peripheral pulses symmetrical Femoral Bruits: no Amputation: no SKIN Rash: no Cyanosis: no Embolic phemonenon: no Bruising: no NEURO Alert and Oriented to person, place and time: yes Non focal: yes LABS Last 3 CBC results: Lab Results  Component Value Date  WBC 5.0 01/14/2017  WBC 7.9 09/18/2006  WBC 8.5 09/17/2006   Lab Results  Component Value Date  HGB 10.9 (L) 01/14/2017  HGB 9.1 (L) 09/18/2006  HGB 9.3 (L) 09/17/2006   Lab Results  Component Value Date  HCT 34.8 (L)  01/14/2017  HCT 0.28 (L) 09/18/2006  HCT 0.27 (L) 09/17/2006   Lab Results  Component Value Date  PLT 285 01/14/2017  PLT 298 09/18/2006  PLT 246 09/17/2006   Lab Results  Component Value Date  CREATININE 1.2 01/14/2017  BUN 11 01/14/2017  NA 133 (L) 01/14/2017  K 4.3 01/14/2017  CL 95 (L) 01/14/2017  CO2 28.5 01/14/2017   Lab Results  Component Value Date  HGBA1C 6.0 09/12/2006   Lab Results  Component Value Date  ALT 25 09/12/2006  AST 45 09/12/2006  ALKPHOS 88 09/12/2006   Assessment and Plan   71 y.o. male with  ICD-10-CM ICD-9-CM  1. CAD of autologous bypass graft-no evidence clinically of ischemia or by EKG. functional study done last year revealed no ischemia with fixed septal defect EF 30-35%. . He does not appear ischemic at present. I25.810 414.02  2. Low back pain, unspecified back pain laterality, with sciatica presence unspecified-Stable at present recovering from back surgery M54.5 724.2  3. Chronic systolic congestive heart failure-no clinical evidence of heart failure. Will continue with ACE-inhibitor and beta-blocker and furosemide for symptom relief. I50.22 428.22  428.0  4. Pure hypercholesterolemia-Will continue with Crestor with an LDL goal of less than 100. E78.0 272.0  5. Essential hypertension-continue with lisinopril metoprolol was well as a dash  diet. I10 401.9   6. Atrial fibrillation-patient appears to be well controlled and is currently anticoagulated. We discussed at length treatment for this including rate control and anticoagulation, cardioversion after loading with an antiarrhythmic agent versus ablation. We attempted cardioversion with conversion to sinus rhythm although reverted back to A. fib. Has been on 400 twice daily of amiodarone for 2 weeks. Will convert back to 200 mg twice daily and reattempt cardioversion later this week. If this is not effective, will refer to electrophysiology for consideration for ablation.  No Follow-up on  file.  These notes generated with voice recognition software. I apologize for typographical errors.  Denton Ar, MD    Pt seen and examined. No change from above

## 2017-02-01 NOTE — Transfer of Care (Signed)
Immediate Anesthesia Transfer of Care Note  Patient: Danny Champagneichard Cully Haywood  Procedure(s) Performed: Procedure(s): CARDIOVERSION (N/A)  Patient Location: PACU  Anesthesia Type:General  Level of Consciousness: awake, alert  and oriented  Airway & Oxygen Therapy: Patient Spontanous Breathing and Patient connected to nasal cannula oxygen  Post-op Assessment: Report given to RN and Post -op Vital signs reviewed and stable  Post vital signs: Reviewed and stable  Last Vitals:  Vitals:   02/01/17 0951 02/01/17 0952  BP:    Pulse: 60 66  Resp: (!) 22 16  Temp:      Last Pain:  Vitals:   02/01/17 0840  TempSrc: Oral         Complications: No apparent anesthesia complications

## 2017-02-01 NOTE — Anesthesia Post-op Follow-up Note (Cosign Needed)
Anesthesia QCDR form completed.        

## 2017-02-01 NOTE — Anesthesia Preprocedure Evaluation (Addendum)
Anesthesia Evaluation  Patient identified by MRN, date of birth, ID band Patient awake    Reviewed: Allergy & Precautions, NPO status , Patient's Chart, lab work & pertinent test results, reviewed documented beta blocker date and time   Airway Mallampati: II  TM Distance: >3 FB     Dental  (+) Chipped, Partial Upper, Partial Lower   Pulmonary former smoker,           Cardiovascular hypertension, Pt. on medications + Past MI, + CABG and +CHF  + dysrhythmias Atrial Fibrillation      Neuro/Psych    GI/Hepatic   Endo/Other    Renal/GU Renal disease     Musculoskeletal   Abdominal   Peds  Hematology   Anesthesia Other Findings Has PVCs.  Reproductive/Obstetrics                            Anesthesia Physical Anesthesia Plan  ASA: II  Anesthesia Plan: General   Post-op Pain Management:    Induction: Intravenous  Airway Management Planned: Mask  Additional Equipment:   Intra-op Plan:   Post-operative Plan:   Informed Consent: I have reviewed the patients History and Physical, chart, labs and discussed the procedure including the risks, benefits and alternatives for the proposed anesthesia with the patient or authorized representative who has indicated his/her understanding and acceptance.     Plan Discussed with: CRNA  Anesthesia Plan Comments:         Anesthesia Quick Evaluation

## 2017-02-01 NOTE — Anesthesia Postprocedure Evaluation (Signed)
Anesthesia Post Note  Patient: Danny Marquez  Procedure(s) Performed: Procedure(s) (LRB): CARDIOVERSION (N/A)  Patient location during evaluation: Cath Lab Anesthesia Type: General Level of consciousness: awake and alert Pain management: pain level controlled Vital Signs Assessment: post-procedure vital signs reviewed and stable Respiratory status: spontaneous breathing, nonlabored ventilation, respiratory function stable and patient connected to nasal cannula oxygen Cardiovascular status: blood pressure returned to baseline and stable Postop Assessment: no signs of nausea or vomiting Anesthetic complications: no     Last Vitals:  Vitals:   02/01/17 1010 02/01/17 1020  BP:    Pulse: 60 61  Resp: 15 19  Temp:      Last Pain:  Vitals:   02/01/17 0840  TempSrc: Oral                 Carlisha Wisler S

## 2017-02-02 ENCOUNTER — Encounter: Payer: Self-pay | Admitting: Cardiology

## 2017-03-01 ENCOUNTER — Observation Stay
Admission: EM | Admit: 2017-03-01 | Discharge: 2017-03-02 | Disposition: A | Discharge status: Home / Self Care | Payer: Medicare Other | Attending: Internal Medicine | Admitting: Internal Medicine

## 2017-03-01 ENCOUNTER — Emergency Department: Payer: Medicare Other

## 2017-03-01 DIAGNOSIS — N183 Chronic kidney disease, stage 3 (moderate): Secondary | ICD-10-CM | POA: Diagnosis not present

## 2017-03-01 DIAGNOSIS — R001 Bradycardia, unspecified: Secondary | ICD-10-CM | POA: Diagnosis not present

## 2017-03-01 DIAGNOSIS — I130 Hypertensive heart and chronic kidney disease with heart failure and stage 1 through stage 4 chronic kidney disease, or unspecified chronic kidney disease: Secondary | ICD-10-CM | POA: Insufficient documentation

## 2017-03-01 DIAGNOSIS — I252 Old myocardial infarction: Secondary | ICD-10-CM | POA: Insufficient documentation

## 2017-03-01 DIAGNOSIS — Z79899 Other long term (current) drug therapy: Secondary | ICD-10-CM | POA: Insufficient documentation

## 2017-03-01 DIAGNOSIS — Z951 Presence of aortocoronary bypass graft: Secondary | ICD-10-CM | POA: Insufficient documentation

## 2017-03-01 DIAGNOSIS — D62 Acute posthemorrhagic anemia: Secondary | ICD-10-CM

## 2017-03-01 DIAGNOSIS — K921 Melena: Secondary | ICD-10-CM | POA: Diagnosis not present

## 2017-03-01 DIAGNOSIS — I5022 Chronic systolic (congestive) heart failure: Secondary | ICD-10-CM | POA: Diagnosis not present

## 2017-03-01 DIAGNOSIS — Z87891 Personal history of nicotine dependence: Secondary | ICD-10-CM | POA: Insufficient documentation

## 2017-03-01 DIAGNOSIS — R079 Chest pain, unspecified: Secondary | ICD-10-CM | POA: Diagnosis present

## 2017-03-01 DIAGNOSIS — I482 Chronic atrial fibrillation: Secondary | ICD-10-CM | POA: Insufficient documentation

## 2017-03-01 DIAGNOSIS — Q600 Renal agenesis, unilateral: Secondary | ICD-10-CM | POA: Insufficient documentation

## 2017-03-01 DIAGNOSIS — D500 Iron deficiency anemia secondary to blood loss (chronic): Secondary | ICD-10-CM | POA: Diagnosis not present

## 2017-03-01 DIAGNOSIS — Z7901 Long term (current) use of anticoagulants: Secondary | ICD-10-CM | POA: Insufficient documentation

## 2017-03-01 DIAGNOSIS — D649 Anemia, unspecified: Secondary | ICD-10-CM | POA: Diagnosis present

## 2017-03-01 HISTORY — DX: Unspecified atrial fibrillation: I48.91

## 2017-03-01 LAB — BASIC METABOLIC PANEL
ANION GAP: 10 (ref 5–15)
BUN: 12 mg/dL (ref 6–20)
CHLORIDE: 95 mmol/L — AB (ref 101–111)
CO2: 25 mmol/L (ref 22–32)
Calcium: 9.5 mg/dL (ref 8.9–10.3)
Creatinine, Ser: 1.39 mg/dL — ABNORMAL HIGH (ref 0.61–1.24)
GFR calc non Af Amer: 49 mL/min — ABNORMAL LOW (ref >60)
GFR, EST AFRICAN AMERICAN: 57 mL/min — AB (ref >60)
Glucose, Bld: 151 mg/dL — ABNORMAL HIGH (ref 65–99)
Potassium: 4 mmol/L (ref 3.5–5.1)
Sodium: 130 mmol/L — ABNORMAL LOW (ref 135–145)

## 2017-03-01 LAB — CBC
HCT: 26.1 % — ABNORMAL LOW (ref 40.0–52.0)
HEMOGLOBIN: 8.1 g/dL — AB (ref 13.0–18.0)
MCH: 25.2 pg — AB (ref 26.0–34.0)
MCHC: 31.1 g/dL — ABNORMAL LOW (ref 32.0–36.0)
MCV: 81.2 fL (ref 80.0–100.0)
Platelets: 259 10*3/uL (ref 150–440)
RBC: 3.22 MIL/uL — AB (ref 4.40–5.90)
RDW: 17.9 % — ABNORMAL HIGH (ref 11.5–14.5)
WBC: 4.7 10*3/uL (ref 3.8–10.6)

## 2017-03-01 LAB — TROPONIN I

## 2017-03-01 LAB — ABO/RH: ABO/RH(D): A POS

## 2017-03-01 MED ORDER — GABAPENTIN 400 MG PO CAPS
400.0000 mg | ORAL_CAPSULE | Freq: Two times a day (BID) | ORAL | Status: DC
  Administered 2017-03-01 – 2017-03-02 (×2): 400 mg via ORAL
  Filled 2017-03-01 (×2): qty 1

## 2017-03-01 MED ORDER — SODIUM CHLORIDE 0.9 % IV SOLN: 10.0000 mL/h | Freq: Once | INTRAVENOUS | Status: DC

## 2017-03-01 MED ORDER — PANTOPRAZOLE SODIUM 40 MG PO TBEC
40.0000 mg | DELAYED_RELEASE_TABLET | Freq: Every day | ORAL | Status: DC
  Administered 2017-03-02: 40 mg via ORAL
  Filled 2017-03-01 (×2): qty 1

## 2017-03-01 MED ORDER — MELATONIN 5 MG PO TABS
10.0000 mg | ORAL_TABLET | Freq: Every day | ORAL | Status: DC
  Administered 2017-03-01: 10 mg via ORAL
  Filled 2017-03-01 (×3): qty 2

## 2017-03-01 MED ORDER — FUROSEMIDE 40 MG PO TABS
40.0000 mg | ORAL_TABLET | Freq: Every day | ORAL | Status: DC
Start: 2017-03-02 — End: 2017-03-02
  Administered 2017-03-02: 40 mg via ORAL
  Filled 2017-03-01 (×2): qty 1

## 2017-03-01 MED ORDER — ONDANSETRON HCL 4 MG/2ML IJ SOLN: 4.0000 mg | Freq: Four times a day (QID) | INTRAMUSCULAR | Status: DC | PRN

## 2017-03-01 MED ORDER — ZOLPIDEM TARTRATE 5 MG PO TABS
5.0000 mg | ORAL_TABLET | Freq: Every day | ORAL | Status: DC
  Administered 2017-03-01: 5 mg via ORAL
  Filled 2017-03-01: qty 1

## 2017-03-01 MED ORDER — ONDANSETRON HCL 4 MG/2ML IJ SOLN
INTRAMUSCULAR | Status: AC
  Administered 2017-03-01: 4 mg via INTRAVENOUS
  Filled 2017-03-01: qty 2

## 2017-03-01 MED ORDER — ACETAMINOPHEN 325 MG PO TABS: 650.0000 mg | ORAL_TABLET | Freq: Four times a day (QID) | ORAL | Status: DC | PRN

## 2017-03-01 MED ORDER — ONDANSETRON HCL 4 MG PO TABS: 4.0000 mg | ORAL_TABLET | Freq: Four times a day (QID) | ORAL | Status: DC | PRN

## 2017-03-01 MED ORDER — PANTOPRAZOLE SODIUM 40 MG IV SOLR
40.0000 mg | Freq: Once | INTRAVENOUS | Status: AC
  Administered 2017-03-01: 40 mg via INTRAVENOUS
  Filled 2017-03-01: qty 40

## 2017-03-01 MED ORDER — POTASSIUM CHLORIDE CRYS ER 20 MEQ PO TBCR
20.0000 meq | EXTENDED_RELEASE_TABLET | Freq: Every day | ORAL | Status: DC
  Administered 2017-03-02: 20 meq via ORAL
  Filled 2017-03-01 (×2): qty 1

## 2017-03-01 MED ORDER — ONDANSETRON HCL 4 MG/2ML IJ SOLN
4.0000 mg | Freq: Once | INTRAMUSCULAR | Status: AC
  Administered 2017-03-01: 4 mg via INTRAVENOUS

## 2017-03-01 MED ORDER — SERTRALINE HCL 50 MG PO TABS: 100.0000 mg | ORAL_TABLET | Freq: Every day | ORAL | Status: DC

## 2017-03-01 NOTE — ED Provider Notes (Signed)
[facility] Emergency Department Provider Note    First MD Initiated Contact with Patient 03/01/17 1440     (approximate)  I have reviewed the triage vital signs and the nursing notes.   HISTORY  Chief Complaint Chest Pain    HPI Majd Tissue is a 71 y.o. male who presents with chest pain and tightness and shortness of breath for the past several days. Pain is mild-to-moderate at this time. Denies any significant orthopnea or lower extremity swelling. States that he first noted worsening shortness of breath roughly 2 weeks ago. Also has left chest pain. Patient recent admission to the hospital for cardioversion for his chronic A. fib and is now on amiodarone and eloquis.  States that he has noted dark stools and intermittent red bloody streaks over the past several weeks. Has not had any today.   Past Medical History:  Diagnosis Date  . A-fib (HCC)   . CHF (congestive heart failure) (HCC)   . Chronic kidney disease    congenital one kidney  . Hypertension   . Myocardial infarction [facility])    Family History  Problem Relation Age of Onset  . Heart attack Father    Past Surgical History:  Procedure Laterality Date  . BACK SURGERY    . CARDIOVERSION N/A 01/23/2017   Procedure: CARDIOVERSION;  Surgeon: Dalia Heading, MD;  Location: [facility] ORS;  Service: Cardiovascular;  Laterality: N/A;  . CARDIOVERSION N/A 02/01/2017   Procedure: CARDIOVERSION;  Surgeon: Dalia Heading, MD;  Location: [facility] ORS;  Service: Cardiovascular;  Laterality: N/A;  . CORONARY ARTERY BYPASS GRAFT     Patient Active Problem List   Diagnosis Date Noted  . Atrial fibrillation with RVR (HCC) 12/08/2016  . Atrial fibrillation (HCC) 12/08/2016      Prior to Admission medications   Medication Sig Start Date End Date Taking? Authorizing Provider  acetaminophen (TYLENOL) 500 MG tablet Take 1,000-1,500 mg by mouth 2 (two) times daily as needed for moderate pain or headache.     [provider]  amiodarone (PACERONE) 200 MG tablet Take 400 mg by mouth 2 (two) times daily.    [provider]  apixaban (ELIQUIS) 5 MG TABS tablet Take 1 tablet (5 mg total) by mouth 2 (two) times daily. 12/09/16   Adrian Saran, MD  furosemide (LASIX) 40 MG tablet Take 40mg s by mouth once daily, may take an additional 40mg s once daily as needed for swelling or weight gain 06/22/16   [provider]  gabapentin (NEURONTIN) 400 MG capsule Take 1 capsule (400 mg total) by mouth 3 (three) times daily. Patient taking differently: Take 400 mg by mouth 2 (two) times daily.  09/24/16   Lenn Sink, DPM  Melatonin 10 MG TABS Take 10 mg by mouth at bedtime.    [provider]  metoprolol (LOPRESSOR) 50 MG tablet Take 1 tablet (50 mg total) by mouth 2 (two) times daily. Patient not taking: Reported on 01/18/2017 12/09/16   Adrian Saran, MD  Naphazoline HCl (CLEAR EYES OP) Apply 1 drop to eye daily as needed (dry eyes).    [provider]  omeprazole (PRILOSEC) 20 MG capsule Take 20 mg by mouth daily.    [provider]  potassium chloride SA (KLOR-CON M20) 20 MEQ tablet TAKE 1 TABLET (20 MEQ TOTAL) BY MOUTH ONCE DAILY. AND EXTRA TABLET IF TAKES 2ND TABLET OF LASIX 05/01/16   [provider]  sertraline (ZOLOFT) 100 MG tablet Take 100 mg  by mouth daily.  08/23/16   [provider]  zolpidem (AMBIEN) 10 MG tablet Take 10 mg by mouth at bedtime.  08/29/16   [provider]    Allergies Codeine and Rosuvastatin    Social History Social History  Substance Use Topics  . Smoking status: Former Smoker    Years: 15.00    Types: Cigarettes  . Smokeless tobacco: Never Used  . Alcohol use 14.4 oz/week    24 Glasses of wine per week    Review of Systems Patient denies headaches, rhinorrhea, blurry vision, numbness, shortness of breath, chest pain, edema, cough, abdominal pain, nausea, vomiting, diarrhea, dysuria, fevers,  rashes or hallucinations unless otherwise stated above in HPI. ____________________________________________   PHYSICAL EXAM:  VITAL SIGNS: Vitals:   03/01/17 1500  BP: 124/67  Pulse: (!) 57  Resp: 14    Constitutional: Alert and oriented. Well appearing and in no acute distress. Eyes: Conjunctivae are normal.  Head: Atraumatic. Nose: No congestion/rhinnorhea. Mouth/Throat: Mucous membranes are moist.   Neck: No stridor. Painless ROM.  Cardiovascular: Normal rate, regular rhythm. Grossly normal heart sounds.  Good peripheral circulation. Respiratory: Normal respiratory effort.  No retractions. Lungs CTAB. Gastrointestinal: Soft and nontender. No distention. No abdominal bruits. No CVA tenderness. Musculoskeletal: No lower extremity tenderness nor edema.  No joint effusions. Neurologic:  Normal speech and language. No gross focal neurologic deficits are appreciated. No facial droop Skin:  Skin is warm, dry and intact. No rash noted. Psychiatric: Mood and affect are normal. Speech and behavior are normal.  ____________________________________________   LABS (all labs ordered are listed, but only abnormal results are displayed)  Results for orders placed or performed during the hospital encounter of 03/01/17 (from the past 24 hour(s))  Basic metabolic panel     Status: Abnormal   Collection Time: 03/01/17  1:25 PM  Result Value Ref Range   Sodium 130 (L) 135 - 145 mmol/L   Potassium 4.0 3.5 - 5.1 mmol/L   Chloride 95 (L) 101 - 111 mmol/L   CO2 25 22 - 32 mmol/L   Glucose, Bld 151 (H) 65 - 99 mg/dL   BUN 12 6 - 20 mg/dL   Creatinine, Ser 4.781.39 (H) 0.61 - 1.24 mg/dL   Calcium 9.5 8.9 - 29.510.3 mg/dL   GFR calc non Af Amer 49 (L) >60 mL/min   GFR calc Af Amer 57 (L) >60 mL/min   Anion gap 10 5 - 15  CBC     Status: Abnormal   Collection Time: 03/01/17  1:25 PM  Result Value Ref Range   WBC 4.7 3.8 - 10.6 K/uL   RBC 3.22 (L) 4.40 - 5.90 MIL/uL   Hemoglobin 8.1 (L) 13.0 - 18.0  g/dL   HCT 62.126.1 (L) 30.840.0 - 65.752.0 %   MCV 81.2 80.0 - 100.0 fL   MCH 25.2 (L) 26.0 - 34.0 pg   MCHC 31.1 (L) 32.0 - 36.0 g/dL   RDW 84.617.9 (H) 96.211.5 - 95.214.5 %   Platelets 259 150 - 440 K/uL  Troponin I     Status: None   Collection Time: 03/01/17  1:25 PM  Result Value Ref Range   Troponin I <0.03 <0.03 ng/mL  Type and screen [facility]     Status: None (Preliminary result)   Collection Time: 03/01/17  3:16 PM  Result Value Ref Range   ABO/RH(D) PENDING    Antibody Screen PENDING    Sample Expiration 03/04/2017    ____________________________________________  EKG My review and personal interpretation at Time: 13:20   Indication: chest pain  Rate: 65  Rhythm: sinus Axis: normal Other: inferolateral st changes when compared to previous, no stemi ____________________________________________  RADIOLOGY  I personally reviewed all radiographic images ordered to evaluate for the above acute complaints and reviewed radiology reports and findings.  These findings were personally discussed with the patient.  Please see medical record for radiology report. ____________________________________________   PROCEDURES  Procedure(s) performed:  Procedures    Critical Care performed: yes CRITICAL CARE Performed by: Willy Eddy   Total critical care time: 30 minutes  Critical care time was exclusive of separately billable procedures and treating other patients.  Critical care was necessary to treat or prevent imminent or life-threatening deterioration.  Critical care was time spent personally by me on the following activities: development of treatment plan with patient and/or surrogate as well as nursing, discussions with consultants, evaluation of patient's response to treatment, examination of patient, obtaining history from patient or surrogate, ordering and performing treatments and interventions, ordering and review of laboratory studies, ordering and review  of radiographic studies, pulse oximetry and re-evaluation of patient's condition.  ____________________________________________   INITIAL IMPRESSION / ASSESSMENT AND PLAN / ED COURSE  Pertinent labs & imaging results that were available during my care of the patient were reviewed by me and considered in my medical decision making (see chart for details).  DDX: cad, acs, chf, pna, abla, gi bleed  Eastyn Dattilo is a 71 y.o. who presents to the ED with above symptoms with complex past medical history on eliquis with evidence of intermittent hematochezia and darker colored maroon stools over the past several weeks.  No heavy bleeding at this time and is currently hemodynamically stable. EKG does show some inferolateral ST changes but no critical ischemic changes as compared to prior. His troponin is negative. No evidence of pneumonia or congestive heart failure. There is evidence of a 4 g acute blood loss since his last admission I do feel that this is likely expiration of his symptoms. Do feel patient will require admission for transfusion and hemodynamic monitoring. Does not seem to be a heavy blood loss from GI bleeding but has not had recent endoscopy and is on eliquis.  Have discussed with the patient and available family all diagnostics and treatments performed thus far and all questions were answered to the best of my ability. The patient demonstrates understanding and agreement with plan.       ____________________________________________   FINAL CLINICAL IMPRESSION(S) / ED DIAGNOSES  Final diagnoses:  Acute blood loss anemia  Chest pain, unspecified type  Hematochezia      NEW MEDICATIONS STARTED DURING THIS VISIT:  New Prescriptions   No medications on file     Note:  This document was prepared using Dragon voice recognition software and may include unintentional dictation errors.    Willy Eddy, MD 03/01/17 [phone]

## 2017-03-01 NOTE — ED Notes (Signed)
Spoke with Maralyn SagoSarah in the lab and she stated the patient had a known antibody reaction and the blood will be ready in about 1.5 - 2 hours.

## 2017-03-01 NOTE — ED Notes (Signed)
ED Provider at bedside. 

## 2017-03-01 NOTE — ED Triage Notes (Signed)
Pt c/o chest pain/tightness for the past 3 days, worse today with SOB.. Pt has a hx of a-fib with coronary bipass.

## 2017-03-01 NOTE — H&P (Signed)
[employer] [facility] [place] [facility]   PATIENT NAME: Danny Marquez    MR#:  [id number]  DATE OF BIRTH:  [birth year]  DATE OF ADMISSION:  03/01/2017  PRIMARY CARE PHYSICIAN: Jaclyn Shaggy, MD   REQUESTING/REFERRING PHYSICIAN: Dr. Willy Eddy  CHIEF COMPLAINT:   Chief Complaint  Patient presents with  . Chest Pain    HISTORY OF PRESENT ILLNESS:  Danny Marquez  is a 71 y.o. male with a known history of Paroxysmal atrial fibrillation on eliquis status post recent cardioversion 4 weeks ago, systolic CHF with EF of 30%, CK D, hypertension, CAD status post CABG presents to hospital secondary to worsening shortness of breath and also dizziness. Patient was having shortness of breath and dizziness for almost a few weeks now. He was recently diagnosed with A. fib about 2 months ago. He was started on eliquis for anticoagulation. Because of his symptoms recently, they thought he was having symptomatic A. fib and plan for his cardioversion. He had initial cardioversion on 01/23/2017 but did not convert and a repeat cardioversion was done on 02/01/1869 converted into normal sinus rhythm. In spite of this his symptoms were persistent to the point that he couldn't get up and move around with his dizziness. So he presented back to the emergency room. His hemoglobin is down to 8, compared to 12 about 2 months ago prior to staring eliquis. He also has noticed dark tarry stools in the last month. Denies any overt bleeding. Denies any abdominal pain, nausea or vomiting.  PAST MEDICAL HISTORY:   Past Medical History:  Diagnosis Date  . A-fib (HCC)   . CHF (congestive heart failure) (HCC)    EF 30%  . Chronic kidney disease    congenital one kidney  . Hypertension   . Myocardial infarction [facility])     PAST SURGICAL HISTORY:   Past Surgical History:  Procedure Laterality Date  . BACK SURGERY    . CARDIOVERSION N/A 01/23/2017   Procedure: CARDIOVERSION;  Surgeon: Dalia Heading, MD;  Location: [facility] ORS;  Service: Cardiovascular;  Laterality: N/A;  . CARDIOVERSION N/A 02/01/2017   Procedure: CARDIOVERSION;  Surgeon: Dalia Heading, MD;  Location: [facility] ORS;  Service: Cardiovascular;  Laterality: N/A;  . CORONARY ARTERY BYPASS GRAFT      SOCIAL HISTORY:   Social History  Substance Use Topics  . Smoking status: Former Smoker    Years: 15.00    Types: Cigarettes  . Smokeless tobacco: Never Used  . Alcohol use 14.4 oz/week    24 Glasses of wine per week    FAMILY HISTORY:   Family History  Problem Relation Age of Onset  . Heart attack Father     DRUG ALLERGIES:   Allergies  Allergen Reactions  . Codeine Nausea Only    dizzy  . Rosuvastatin Other (See Comments)    Myalgia     REVIEW OF SYSTEMS:   Review of Systems  Constitutional: Negative for chills, fever, malaise/fatigue and weight loss.  HENT: Negative for ear discharge, ear pain, hearing loss and nosebleeds.   Eyes: Negative for blurred vision, double vision and photophobia.  Respiratory: Positive for shortness of breath. Negative for cough, hemoptysis and wheezing.   Cardiovascular: Negative for chest pain, palpitations, orthopnea and leg swelling.  Gastrointestinal: Negative for abdominal pain, constipation, diarrhea, heartburn, melena, nausea and vomiting.  Genitourinary: Negative for dysuria, frequency and urgency.  Musculoskeletal: Negative for back pain, myalgias and neck pain.  Skin: Negative for rash.  Neurological: Positive for dizziness. Negative for tremors, sensory change, speech change, focal weakness and headaches.  Endo/Heme/Allergies: Does not bruise/bleed easily.  Psychiatric/Behavioral: Negative for depression.    MEDICATIONS AT HOME:   Prior to Admission medications   Medication Sig Start Date End Date Taking? Authorizing Provider  acetaminophen (TYLENOL) 500 MG tablet Take 1,000-1,500 mg by mouth 2 (two) times daily as needed for moderate pain or headache.   Yes  [provider]  amiodarone (PACERONE) 200 MG tablet Take 400 mg by mouth 2 (two) times daily.   Yes [provider]  apixaban (ELIQUIS) 5 MG TABS tablet Take 1 tablet (5 mg total) by mouth 2 (two) times daily. 12/09/16  Yes Adrian Saran, MD  furosemide (LASIX) 40 MG tablet Take 40mg s by mouth once daily, may take an additional 40mg s once daily as needed for swelling or weight gain 06/22/16  Yes [provider]  gabapentin (NEURONTIN) 400 MG capsule Take 1 capsule (400 mg total) by mouth 3 (three) times daily. Patient taking differently: Take 400 mg by mouth 2 (two) times daily.  09/24/16  Yes RegalKirstie Peri, DPM  Melatonin 10 MG TABS Take 10 mg by mouth at bedtime.   Yes [provider]  Naphazoline HCl (CLEAR EYES OP) Apply 1 drop to eye daily as needed (dry eyes).   Yes [provider]  omeprazole (PRILOSEC) 20 MG capsule Take 20 mg by mouth daily.   Yes [provider]  potassium chloride SA (KLOR-CON M20) 20 MEQ tablet TAKE 1 TABLET (20 MEQ TOTAL) BY MOUTH ONCE DAILY. AND EXTRA TABLET IF TAKES 2ND TABLET OF LASIX 05/01/16  Yes [provider]  ramipril (ALTACE) 5 MG capsule Take 5 mg by mouth daily. 01/26/17  Yes [provider]  sertraline (ZOLOFT) 100 MG tablet Take 100 mg by mouth daily.  08/23/16  Yes [provider]  zolpidem (AMBIEN) 10 MG tablet Take 10 mg by mouth at bedtime.  08/29/16  Yes [provider]  metoprolol (LOPRESSOR) 50 MG tablet Take 1 tablet (50 mg total) by mouth 2 (two) times daily. Patient not taking: Reported on 01/18/2017 12/09/16   Adrian Saran, MD      VITAL SIGNS:  Blood pressure 124/67, pulse (!) 57, resp. rate 14, height 5\' 9"  (1.753 m), weight 89.8 kg (198 lb), SpO2 97 %.  PHYSICAL EXAMINATION:   Physical Exam  GENERAL:  71 y.o.-year-old pleasant patient sitting in the bed with no acute distress.  EYES: Pupils equal, round, reactive to light and accommodation. No scleral  icterus. Extraocular muscles intact.  HEENT: Head atraumatic, normocephalic. Oropharynx and nasopharynx clear.  NECK:  Supple, no jugular venous distention. No thyroid enlargement, no tenderness.  LUNGS: Normal breath sounds bilaterally, no wheezing, rales,rhonchi or crepitation. No use of accessory muscles of respiration.  CARDIOVASCULAR: S1, S2 normal. No murmurs, rubs, or gallops.  ABDOMEN: Soft, nontender, nondistended. Bowel sounds present. No organomegaly or mass.  EXTREMITIES: No pedal edema, cyanosis, or clubbing.  NEUROLOGIC: Cranial nerves II through XII are intact. Muscle strength 5/5 in all extremities. Sensation intact. Gait not checked.  PSYCHIATRIC: The patient is alert and oriented x 3.  SKIN: No obvious rash, lesion, or ulcer.   LABORATORY PANEL:   CBC  Recent Labs Lab 03/01/17 1325  WBC 4.7  HGB 8.1*  HCT 26.1*  PLT 259   ------------------------------------------------------------------------------------------------------------------  Chemistries   Recent Labs Lab 03/01/17 1325  NA 130*  K 4.0  CL 95*  CO2 25  GLUCOSE 151*  BUN 12  CREATININE 1.39*  CALCIUM 9.5   ------------------------------------------------------------------------------------------------------------------  Cardiac Enzymes  Recent Labs Lab 03/01/17 1325  TROPONINI <0.03   ------------------------------------------------------------------------------------------------------------------  RADIOLOGY:  Dg Chest 2 View  Result Date: 03/01/2017 CLINICAL DATA:  Atrial fibrillation for 2-3 weeks. LEFT-sided chest pain. EXAM: CHEST  2 VIEW COMPARISON:  12/08/2016. FINDINGS: Cardiomegaly. Prior CABG. Mild vascular congestion. No active infiltrates or failure. LEFT basilar subsegmental atelectasis or scarring. No effusion or pneumothorax. Bones unremarkable. IMPRESSION: Stable chest. Cardiomegaly. Mild vascular congestion. No active infiltrates or failure. Electronically Signed   By:  Elsie StainJohn T Curnes M.D.   On: 03/01/2017 13:52    EKG:   Orders placed or performed during the hospital encounter of 03/01/17  . ED EKG within 10 minutes  . ED EKG within 10 minutes  . EKG 12-Lead  . EKG 12-Lead    IMPRESSION AND PLAN:   Michael LitterRichard Banes  is a 71 y.o. male with a known history of Paroxysmal atrial fibrillation on eliquis status post recent cardioversion 4 weeks ago, systolic CHF with EF of 30%, CK D, hypertension, CAD status post CABG presents to hospital secondary to worsening shortness of breath and also dizziness.  #1 Symptomatic anemia- with history of CABG, hemoglobin dropped from 12-8 could've caused his dyspnea and dizziness. -Holding eliquis for now-discussed with cardiologist. -Transfuse 1-2 units of packed RBC to keep hemoglobin greater than 9 -Ambulate after transfusion.  #2 atrial fibrillation-status post successful cardioversion 4 weeks ago. -Continue amiodarone 200 mg daily. Hold eliquis-discussed with cardiologist. -Remains in normal sinus rhythm now. Follow up with cardiology as outpatient in 1-2 weeks.  #3 chronic systolic CHF-last none of of 25-30%. Continue home Lasix. Euvolemic at this time. Well compensated -Bradycardic, so not on beta blocker. Can be started on ACEI or ARB as outpatient if blood pressure remains stable  #4 CKD stage 3- stable, monitor  #5 DVT prophylaxis-Ted's and SCDs, due to his anemia- no heparin products    All the records are reviewed and case discussed with ED provider. Management plans discussed with the patient, family and they are in agreement.  CODE STATUS: Full Code  TOTAL TIME TAKING CARE OF THIS PATIENT: 50 minutes.    Enid BaasKALISETTI,Alena Blankenbeckler M.D on 03/01/2017 at 4:27 PM  Between 7am to 6pm - Pager - [phone]  After 6pm go to [email or web address] - Social research officer, governmentpassword EPAS [facility]  [facility]  Office  [phone]  CC: Primary care physician; Jaclyn Shaggyate, Denny C, MD

## 2017-03-01 NOTE — Progress Notes (Signed)
Patient arrived on unit from the ED via stretcher. Patient is AAOx4. IV to right arm intact. Patient denies SOB or chest pain at this time. Patient oriented to room. VS obtained. Plan of care discussed with patient. Food service called on patient behalf. Patient denies needs at this time. Call bell in reach, bed in lowest position. Will continue to monitor.

## 2017-03-02 DIAGNOSIS — D500 Iron deficiency anemia secondary to blood loss (chronic): Secondary | ICD-10-CM | POA: Diagnosis not present

## 2017-03-02 LAB — CBC
HCT: 24.7 % — ABNORMAL LOW (ref 40.0–52.0)
HCT: 28.6 % — ABNORMAL LOW (ref 40.0–52.0)
Hemoglobin: 8 g/dL — ABNORMAL LOW (ref 13.0–18.0)
Hemoglobin: 9.2 g/dL — ABNORMAL LOW (ref 13.0–18.0)
MCH: 26.3 pg (ref 26.0–34.0)
MCH: 26.2 pg (ref 26.0–34.0)
MCHC: 32.2 g/dL (ref 32.0–36.0)
MCHC: 32.2 g/dL (ref 32.0–36.0)
MCV: 81.6 fL (ref 80.0–100.0)
MCV: 81.4 fL (ref 80.0–100.0)
PLATELETS: 211 10*3/uL (ref 150–440)
PLATELETS: 239 10*3/uL (ref 150–440)
RBC: 3.03 MIL/uL — AB (ref 4.40–5.90)
RBC: 3.51 MIL/uL — AB (ref 4.40–5.90)
RDW: 17.5 % — AB (ref 11.5–14.5)
RDW: 16.9 % — AB (ref 11.5–14.5)
WBC: 5.5 10*3/uL (ref 3.8–10.6)
WBC: 6.2 10*3/uL (ref 3.8–10.6)

## 2017-03-02 LAB — BASIC METABOLIC PANEL
Anion gap: 5 (ref 5–15)
BUN: 13 mg/dL (ref 6–20)
CALCIUM: 8.9 mg/dL (ref 8.9–10.3)
CO2: 29 mmol/L (ref 22–32)
CREATININE: 1.23 mg/dL (ref 0.61–1.24)
Chloride: 96 mmol/L — ABNORMAL LOW (ref 101–111)
GFR calc non Af Amer: 57 mL/min — ABNORMAL LOW (ref >60)
Glucose, Bld: 111 mg/dL — ABNORMAL HIGH (ref 65–99)
Potassium: 4 mmol/L (ref 3.5–5.1)
SODIUM: 130 mmol/L — AB (ref 135–145)

## 2017-03-02 LAB — TROPONIN I: Troponin I: <0.03 ng/mL (ref <0.03)

## 2017-03-02 MED ORDER — FERROUS SULFATE 325 (65 FE) MG PO TBEC: 325.0000 mg | DELAYED_RELEASE_TABLET | Freq: Two times a day (BID) | ORAL | 3 refills | Status: DC

## 2017-03-02 MED ORDER — SODIUM CHLORIDE 0.9 % IV SOLN: Freq: Once | INTRAVENOUS | Status: DC

## 2017-03-02 NOTE — Plan of Care (Signed)
Problem: Education: Goal: Knowledge of [facility] Education information/materials will improve Outcome: Progressing Patient admitted for anemia and to received one unit of PRBC this shift. Policy and procedure of blood adminstration will be reviewed as well adverse reactions to be aware of  Problem: Pain Managment: Goal: General experience of comfort will improve Patient will be free of pain and given PRN pain medication if needed  Problem: Skin Integrity: Goal: Risk for impaired skin integrity will decrease Outcome: Progressing Pt skin will remain clean, dry and intact  Problem: Fluid Volume: Goal: Ability to maintain a balanced intake and output will improve Outcome: Progressing Patient will remain hemodynamically stable during the shift.

## 2017-03-02 NOTE — Discharge Instructions (Signed)
Anemia, Nonspecific Anemia is a condition in which the concentration of red blood cells or hemoglobin in the blood is below normal. Hemoglobin is a substance in red blood cells that carries oxygen to the tissues of the body. Anemia results in not enough oxygen reaching these tissues. What are the causes? Common causes of anemia include:  Excessive bleeding. Bleeding may be internal or external. This includes excessive bleeding from periods (in women) or from the intestine.  Poor nutrition.  Chronic kidney, thyroid, and liver disease.  Bone marrow disorders that decrease red blood cell production.  Cancer and treatments for cancer.  HIV, AIDS, and their treatments.  Spleen problems that increase red blood cell destruction.  Blood disorders.  Excess destruction of red blood cells due to infection, medicines, and autoimmune disorders. What are the signs or symptoms?  Minor weakness.  Dizziness.  Headache.  Palpitations.  Shortness of breath, especially with exercise.  Paleness.  Cold sensitivity.  Indigestion.  Nausea.  Difficulty sleeping.  Difficulty concentrating. Symptoms may occur suddenly or they may develop slowly. How is this diagnosed? Additional blood tests are often needed. These help your health care provider determine the best treatment. Your health care provider will check your stool for blood and look for other causes of blood loss. How is this treated? Treatment varies depending on the cause of the anemia. Treatment can include:  Supplements of iron, vitamin B12, or folic acid.  Hormone medicines.  A blood transfusion. This may be needed if blood loss is severe.  Hospitalization. This may be needed if there is significant continual blood loss.  Dietary changes.  Spleen removal. Follow these instructions at home: Keep all follow-up appointments. It often takes many weeks to correct anemia, and having your health care provider check on your  condition and your response to treatment is very important. Get help right away if:  You develop extreme weakness, shortness of breath, or chest pain.  You become dizzy or have trouble concentrating.  You develop heavy vaginal bleeding.  You develop a rash.  You have bloody or black, tarry stools.  You faint.  You vomit up blood.  You vomit repeatedly.  You have abdominal pain.  You have a fever or persistent symptoms for more than 2-3 days.  You have a fever and your symptoms suddenly get worse.  You are dehydrated. This information is not intended to replace advice given to you by your health care provider. Make sure you discuss any questions you have with your health care provider. Document Released: 10/04/2004 Document Revised: 02/08/2016 Document Reviewed: 02/20/2013 Elsevier Interactive Patient Education  2017 Elsevier Inc.  

## 2017-03-02 NOTE — Progress Notes (Signed)
Discharge instructions and prescriptions reviewed with patient and family. Pt/family verbalized understanding. IV removed, pressure dressing applied.

## 2017-03-02 NOTE — Progress Notes (Addendum)
1 unit of blood was transfused successfully, pt tolerated well. Unable to administer the second unit; per lab, a second unit will need a separate order for transfusion and crossmatch due to pt's  Antibodies. Md Hugelmeyer made aware, no new orders received.

## 2017-03-02 NOTE — Care Management Obs Status (Signed)
MEDICARE OBSERVATION STATUS NOTIFICATION   Patient Details  Name: Danny Marquez MRN: [id number] Date of Birth: 1946-05-02   Medicare Observation Status Notification Given:  No (Discharged in less than 24 hours)    Joanette Silveria A, RN 03/02/2017, 3:12 PM

## 2017-03-03 LAB — PREPARE RBC (CROSSMATCH)

## 2017-03-04 LAB — PREPARE RBC (CROSSMATCH)

## 2017-03-05 LAB — TYPE AND SCREEN
ABO/RH(D): A POS
Antibody Screen: POSITIVE
DONOR AG TYPE: NEGATIVE
Donor AG Type: NEGATIVE
UNIT DIVISION: 0
UNIT DIVISION: 0
UNIT DIVISION: 0
Unit division: 0

## 2017-03-05 LAB — BPAM RBC
Blood Product Expiration Date: 201807012359
Blood Product Expiration Date: 201807082359
Blood Product Expiration Date: 201807152359
Blood Product Expiration Date: 201807182359
ISSUE DATE / TIME: 201806231248
ISSUE DATE / TIME: 201806222014
UNIT TYPE AND RH: 5100
Unit Type and Rh: 6200
Unit Type and Rh: 6200
Unit Type and Rh: 6200

## 2017-03-07 NOTE — Discharge Summary (Signed)
[facility] [place] at [facility]   PATIENT NAME: Danny Marquez    MR#:  [id number]  DATE OF BIRTH:  1945-10-01  DATE OF ADMISSION:  03/01/2017 ADMITTING PHYSICIAN: Enid Baas, MD  DATE OF DISCHARGE: 03/02/2017  6:30 PM  PRIMARY CARE PHYSICIAN: Jaclyn Shaggy, MD    ADMISSION DIAGNOSIS:  Hematochezia [K92.1] Acute blood loss anemia [D62] Chest pain, unspecified type [R07.9]  DISCHARGE DIAGNOSIS:  Active Problems:   Anemia   SECONDARY DIAGNOSIS:   Past Medical History:  Diagnosis Date  . A-fib (HCC)   . CHF (congestive heart failure) (HCC)    EF 30%  . Chronic kidney disease    congenital one kidney  . Hypertension   . Myocardial infarction [facility])     HOSPITAL COURSE:   Danny Marquez  is a 71 y.o. male with a known history of Paroxysmal atrial fibrillation on eliquis status post recent cardioversion 4 weeks ago, systolic CHF with EF of 30%, CK D, hypertension, CAD status post CABG presents to hospital secondary to worsening shortness of breath and also dizziness.  #1 Symptomatic anemia, due to blood loss - with history of CABG, hemoglobin dropped from 12-8 could've caused his dyspnea and dizziness. -Holding eliquis for now-discussed with cardiologist. -Transfuse 1-2 units of packed RBC to keep hemoglobin greater than 9 -Ambulate after transfusion. - pt felt better with Hb >9. Stopped eliquis on d/c. - advised to follow with cardio and PMD in 1-2 weeks.  #2 atrial fibrillation-status post successful cardioversion 4 weeks ago. -Continue amiodarone 200 mg daily. Hold eliquis-discussed with cardiologist. -Remains in normal sinus rhythm now. Follow up with cardiology as outpatient in 1-2 weeks.  #3 chronic systolic CHF-last none of of 25-30%. Continue home Lasix. Euvolemic at this time. Well compensated -Bradycardic, so not on beta blocker. Can be started on ACEI or ARB as outpatient if blood pressure remains stable  #4 CKD stage  3- stable, monitor  #5 DVT prophylaxis-Ted's and SCDs, due to his anemia- no heparin products   DISCHARGE CONDITIONS:   Stable.  CONSULTS OBTAINED:    DRUG ALLERGIES:   Allergies  Allergen Reactions  . Codeine Nausea Only    dizzy  . Rosuvastatin Other (See Comments)    Myalgia     DISCHARGE MEDICATIONS:   Discharge Medication List as of 03/02/2017  5:49 PM    START taking these medications   Details  ferrous sulfate 325 (65 FE) MG EC tablet Take 1 tablet (325 mg total) by mouth 2 (two) times daily., Starting Sat 03/02/2017, Until Sun 03/02/2018, Print      CONTINUE these medications which have NOT CHANGED   Details  acetaminophen (TYLENOL) 500 MG tablet Take 1,000-1,500 mg by mouth 2 (two) times daily as needed for moderate pain or headache., Historical Med    amiodarone (PACERONE) 200 MG tablet Take 400 mg by mouth 2 (two) times daily., Historical Med    furosemide (LASIX) 40 MG tablet Take 40mg s by mouth once daily, may take an additional 40mg s once daily as needed for swelling or weight gain, Historical Med    gabapentin (NEURONTIN) 400 MG capsule Take 1 capsule (400 mg total) by mouth 3 (three) times daily., Starting Mon 09/24/2016, Normal    Melatonin 10 MG TABS Take 10 mg by mouth at bedtime., Historical Med    Naphazoline HCl (CLEAR EYES OP) Apply 1 drop to eye daily as needed (dry eyes)., Historical Med    omeprazole (PRILOSEC) 20 MG capsule Take 20  mg by mouth daily., Historical Med    potassium chloride SA (KLOR-CON M20) 20 MEQ tablet TAKE 1 TABLET (20 MEQ TOTAL) BY MOUTH ONCE DAILY. AND EXTRA TABLET IF TAKES 2ND TABLET OF LASIX, Historical Med    ramipril (ALTACE) 5 MG capsule Take 5 mg by mouth daily., Starting Sat 01/26/2017, Historical Med    sertraline (ZOLOFT) 100 MG tablet Take 100 mg by mouth daily. , Starting Thu 08/23/2016, Historical Med    zolpidem (AMBIEN) 10 MG tablet Take 10 mg by mouth at bedtime. , Starting Wed 08/29/2016, Historical Med     metoprolol (LOPRESSOR) 50 MG tablet Take 1 tablet (50 mg total) by mouth 2 (two) times daily., Starting Sun 12/09/2016, Print      STOP taking these medications     apixaban (ELIQUIS) 5 MG TABS tablet          DISCHARGE INSTRUCTIONS:    Follow with PMD and cardiologist in 1-2 weeks.  If you experience worsening of your admission symptoms, develop shortness of breath, life threatening emergency, suicidal or homicidal thoughts you must seek medical attention immediately by calling 911 or calling your MD immediately  if symptoms less severe.  You Must read complete instructions/literature along with all the possible adverse reactions/side effects for all the Medicines you take and that have been prescribed to you. Take any new Medicines after you have completely understood and accept all the possible adverse reactions/side effects.   Please note  You were cared for by a hospitalist during your hospital stay. If you have any questions about your discharge medications or the care you received while you were in the hospital after you are discharged, you can call the unit and asked to speak with the hospitalist on call if the hospitalist that took care of you is not available. Once you are discharged, your primary care physician will handle any further medical issues. Please note that NO REFILLS for any discharge medications will be authorized once you are discharged, as it is imperative that you return to your primary care physician (or establish a relationship with a primary care physician if you do not have one) for your aftercare needs so that they can reassess your need for medications and monitor your lab values.    Today   CHIEF COMPLAINT:   Chief Complaint  Patient presents with  . Chest Pain    HISTORY OF PRESENT ILLNESS:  Danny Marquez  is a 71 y.o. male with a known history of Paroxysmal atrial fibrillation on eliquis status post recent cardioversion 4 weeks ago, systolic  CHF with EF of 30%, CK D, hypertension, CAD status post CABG presents to hospital secondary to worsening shortness of breath and also dizziness. Patient was having shortness of breath and dizziness for almost a few weeks now. He was recently diagnosed with A. fib about 2 months ago. He was started on eliquis for anticoagulation. Because of his symptoms recently, they thought he was having symptomatic A. fib and plan for his cardioversion. He had initial cardioversion on 01/23/2017 but did not convert and a repeat cardioversion was done on 02/01/1869 converted into normal sinus rhythm. In spite of this his symptoms were persistent to the point that he couldn't get up and move around with his dizziness. So he presented back to the emergency room. His hemoglobin is down to 8, compared to 12 about 2 months ago prior to staring eliquis. He also has noticed dark tarry stools in the last month. Denies any overt  bleeding. Denies any abdominal pain, nausea or vomiting.  VITAL SIGNS:  Blood pressure (!) 123/52, pulse (!) 58, temperature 98.4 F (36.9 C), temperature source Oral, resp. rate 16, height 5\' 9"  (1.753 m), weight 89.8 kg (198 lb), SpO2 97 %.  I/O:  No intake or output data in the 24 hours ending 03/07/17 1322  PHYSICAL EXAMINATION:  GENERAL:  71 y.o.-year-old patient lying in the bed with no acute distress.  EYES: Pupils equal, round, reactive to light and accommodation. No scleral icterus. Extraocular muscles intact.  HEENT: Head atraumatic, normocephalic. Oropharynx and nasopharynx clear.  NECK:  Supple, no jugular venous distention. No thyroid enlargement, no tenderness.  LUNGS: Normal breath sounds bilaterally, no wheezing, rales,rhonchi or crepitation. No use of accessory muscles of respiration.  CARDIOVASCULAR: S1, S2 normal. No murmurs, rubs, or gallops.  ABDOMEN: Soft, non-tender, non-distended. Bowel sounds present. No organomegaly or mass.  EXTREMITIES: No pedal edema, cyanosis, or  clubbing.  NEUROLOGIC: Cranial nerves II through XII are intact. Muscle strength 5/5 in all extremities. Sensation intact. Gait not checked.  PSYCHIATRIC: The patient is alert and oriented x 3.  SKIN: No obvious rash, lesion, or ulcer.   DATA REVIEW:   CBC  Recent Labs Lab 03/02/17 1644  WBC 6.2  HGB 9.2*  HCT 28.6*  PLT 239    Chemistries   Recent Labs Lab 03/02/17 0534  NA 130*  K 4.0  CL 96*  CO2 29  GLUCOSE 111*  BUN 13  CREATININE 1.23  CALCIUM 8.9    Cardiac Enzymes  Recent Labs Lab 03/02/17 0534  TROPONINI <0.03    Microbiology Results  No results found for this or any previous visit.  RADIOLOGY:  No results found.  EKG:   Orders placed or performed during the hospital encounter of 03/01/17  . ED EKG within 10 minutes  . ED EKG within 10 minutes  . EKG 12-Lead  . EKG 12-Lead  . EKG      Management plans discussed with the patient, family and they are in agreement.  CODE STATUS:  Code Status History    Date Active Date Inactive Code Status Order ID Comments User Context   03/01/2017  5:57 PM 03/02/2017  9:35 PM Full Code [id number]  Enid Baas, MD Inpatient   12/08/2016 11:59 PM 12/09/2016  1:50 PM Full Code [id number]  Altamese Dilling, MD Inpatient      TOTAL TIME TAKING CARE OF THIS PATIENT: 35 minutes.    Altamese Dilling M.D on 03/07/2017 at 1:22 PM  Between 7am to 6pm - Pager - [phone]  After 6pm go to [email or web address] - password [email or web address]  [facility] [place] [facility]  Office  [phone]  CC: Primary care physician; Jaclyn Shaggy, MD   Note: This dictation was prepared with Dragon dictation along with smaller phrase technology. Any transcriptional errors that result from this process are unintentional.

## 2017-04-12 ENCOUNTER — Encounter: Payer: Self-pay | Admitting: Medical Oncology

## 2017-04-12 ENCOUNTER — Emergency Department
Admission: EM | Admit: 2017-04-12 | Discharge: 2017-04-12 | Disposition: A | Discharge status: Home / Self Care | Payer: Medicare Other | Attending: Emergency Medicine | Admitting: Emergency Medicine

## 2017-04-12 ENCOUNTER — Emergency Department: Payer: Medicare Other

## 2017-04-12 DIAGNOSIS — Z87891 Personal history of nicotine dependence: Secondary | ICD-10-CM | POA: Diagnosis not present

## 2017-04-12 DIAGNOSIS — K852 Alcohol induced acute pancreatitis without necrosis or infection: Secondary | ICD-10-CM | POA: Diagnosis not present

## 2017-04-12 DIAGNOSIS — R1011 Right upper quadrant pain: Secondary | ICD-10-CM | POA: Diagnosis present

## 2017-04-12 DIAGNOSIS — I509 Heart failure, unspecified: Secondary | ICD-10-CM | POA: Diagnosis not present

## 2017-04-12 DIAGNOSIS — N189 Chronic kidney disease, unspecified: Secondary | ICD-10-CM | POA: Diagnosis not present

## 2017-04-12 DIAGNOSIS — I4891 Unspecified atrial fibrillation: Secondary | ICD-10-CM | POA: Diagnosis not present

## 2017-04-12 DIAGNOSIS — Z951 Presence of aortocoronary bypass graft: Secondary | ICD-10-CM | POA: Insufficient documentation

## 2017-04-12 DIAGNOSIS — I130 Hypertensive heart and chronic kidney disease with heart failure and stage 1 through stage 4 chronic kidney disease, or unspecified chronic kidney disease: Secondary | ICD-10-CM | POA: Insufficient documentation

## 2017-04-12 DIAGNOSIS — Q600 Renal agenesis, unilateral: Secondary | ICD-10-CM | POA: Diagnosis not present

## 2017-04-12 DIAGNOSIS — Z79899 Other long term (current) drug therapy: Secondary | ICD-10-CM | POA: Diagnosis not present

## 2017-04-12 LAB — CBC WITH DIFFERENTIAL/PLATELET
Basophils Absolute: 0 10*3/uL (ref 0–0.1)
Basophils Relative: 1 %
EOS ABS: 0.5 10*3/uL (ref 0–0.7)
EOS PCT: 11 %
HCT: 40.6 % (ref 40.0–52.0)
Hemoglobin: 13.3 g/dL (ref 13.0–18.0)
LYMPHS ABS: 0.9 10*3/uL — AB (ref 1.0–3.6)
Lymphocytes Relative: 17 %
MCH: 30 pg (ref 26.0–34.0)
MCHC: 32.8 g/dL (ref 32.0–36.0)
MCV: 91.6 fL (ref 80.0–100.0)
MONOS PCT: 9 %
Monocytes Absolute: 0.5 10*3/uL (ref 0.2–1.0)
Neutro Abs: 3.2 10*3/uL (ref 1.4–6.5)
Neutrophils Relative %: 62 %
PLATELETS: 281 10*3/uL (ref 150–440)
RBC: 4.43 MIL/uL (ref 4.40–5.90)
RDW: 23.1 % — ABNORMAL HIGH (ref 11.5–14.5)
WBC: 5.1 10*3/uL (ref 3.8–10.6)

## 2017-04-12 LAB — URINALYSIS, COMPLETE (UACMP) WITH MICROSCOPIC
BACTERIA UA: NONE SEEN
Bilirubin Urine: NEGATIVE
Glucose, UA: NEGATIVE mg/dL
Hgb urine dipstick: NEGATIVE
KETONES UR: NEGATIVE mg/dL
LEUKOCYTES UA: NEGATIVE
Nitrite: NEGATIVE
PROTEIN: NEGATIVE mg/dL
SQUAMOUS EPITHELIAL / LPF: NONE SEEN
Specific Gravity, Urine: 1.004 — ABNORMAL LOW (ref 1.005–1.030)
pH: 6 (ref 5.0–8.0)

## 2017-04-12 LAB — COMPREHENSIVE METABOLIC PANEL
ALT: 14 U/L — AB (ref 17–63)
ANION GAP: 11 (ref 5–15)
AST: 41 U/L (ref 15–41)
Albumin: 4.3 g/dL (ref 3.5–5.0)
Alkaline Phosphatase: 58 U/L (ref 38–126)
BUN: 13 mg/dL (ref 6–20)
CALCIUM: 9.7 mg/dL (ref 8.9–10.3)
CHLORIDE: 98 mmol/L — AB (ref 101–111)
CO2: 24 mmol/L (ref 22–32)
CREATININE: 1.23 mg/dL (ref 0.61–1.24)
GFR calc non Af Amer: 57 mL/min — ABNORMAL LOW (ref >60)
Glucose, Bld: 107 mg/dL — ABNORMAL HIGH (ref 65–99)
Potassium: 4.2 mmol/L (ref 3.5–5.1)
SODIUM: 133 mmol/L — AB (ref 135–145)
Total Bilirubin: 0.7 mg/dL (ref 0.3–1.2)
Total Protein: 7.8 g/dL (ref 6.5–8.1)

## 2017-04-12 LAB — LIPASE, BLOOD: Lipase: 157 U/L — ABNORMAL HIGH (ref 11–51)

## 2017-04-12 MED ORDER — IOPAMIDOL (ISOVUE-300) INJECTION 61%: 30.0000 mL | Freq: Once | INTRAVENOUS | Status: DC | PRN

## 2017-04-12 MED ORDER — CHLORDIAZEPOXIDE HCL 25 MG PO CAPS: ORAL_CAPSULE | ORAL | 0 refills | Status: DC

## 2017-04-12 MED ORDER — MORPHINE SULFATE (PF) 4 MG/ML IV SOLN
4.0000 mg | Freq: Once | INTRAVENOUS | Status: AC
  Administered 2017-04-12: 4 mg via INTRAVENOUS
  Filled 2017-04-12: qty 1

## 2017-04-12 MED ORDER — ONDANSETRON 4 MG PO TBDP: 4.0000 mg | ORAL_TABLET | Freq: Three times a day (TID) | ORAL | 0 refills | Status: DC | PRN

## 2017-04-12 MED ORDER — OXYCODONE HCL 5 MG PO TABS: 5.0000 mg | ORAL_TABLET | Freq: Three times a day (TID) | ORAL | 0 refills | Status: AC | PRN

## 2017-04-12 MED ORDER — IOPAMIDOL (ISOVUE-300) INJECTION 61%
30.0000 mL | Freq: Once | INTRAVENOUS | Status: AC | PRN
  Administered 2017-04-12: 30 mL via ORAL

## 2017-04-12 MED ORDER — IOPAMIDOL (ISOVUE-300) INJECTION 61%
100.0000 mL | Freq: Once | INTRAVENOUS | Status: AC | PRN
  Administered 2017-04-12: 100 mL via INTRAVENOUS

## 2017-04-12 NOTE — ED Notes (Signed)
Pt verbalized understanding of discharge instructions. NAD at this time. 

## 2017-04-12 NOTE — ED Triage Notes (Signed)
Pt reports RUQ abd pain that began last night, pain radiates around to flank.

## 2017-04-12 NOTE — BH Assessment (Signed)
Per the request of ER MD (Dr. Mayford KnifeWilliams) writer spoke with the patient about resources to address his alcohol use.

## 2017-04-12 NOTE — ED Provider Notes (Signed)
[facility] Emergency Department Provider Note       Time seen: ----------------------------------------- 10:17 AM on 04/12/2017 -----------------------------------------     I have reviewed the triage vital signs and the nursing notes.   HISTORY   Chief Complaint Abdominal Pain    HPI Danny ChampagneRichard Cully Quinonez is a 71 y.o. male who presents to the ED for right upper quadrant pain that began last night. Pain radiates to the right flank. Patient describes an intermittent pain that has been present over the past week. He was seen by his doctor and was encouraged to have an ultrasound of the abdomen. He denies fevers, chills, chest pain, shortness of breath, vomiting or diarrhea. Pain is 4 out of 10 in the right lower quadrant. Recently he had abdominal pain that he attributed to bloating of his stomach.   Past Medical History:  Diagnosis Date  . A-fib (HCC)   . CHF (congestive heart failure) (HCC)    EF 30%  . Chronic kidney disease    congenital one kidney  . Hypertension   . Myocardial infarction [facility](HCC)     Patient Active Problem List   Diagnosis Date Noted  . Anemia 03/01/2017  . Atrial fibrillation with RVR (HCC) 12/08/2016  . Atrial fibrillation (HCC) 12/08/2016    Past Surgical History:  Procedure Laterality Date  . BACK SURGERY    . CARDIOVERSION N/A 01/23/2017   Procedure: CARDIOVERSION;  Surgeon: Dalia HeadingFath, Kenneth A, MD;  Location: [facility] ORS;  Service: Cardiovascular;  Laterality: N/A;  . CARDIOVERSION N/A 02/01/2017   Procedure: CARDIOVERSION;  Surgeon: Dalia HeadingFath, Kenneth A, MD;  Location: [facility] ORS;  Service: Cardiovascular;  Laterality: N/A;  . CORONARY ARTERY BYPASS GRAFT      Allergies Codeine and Rosuvastatin  Social History Social History  Substance Use Topics  . Smoking status: Former Smoker    Years: 15.00    Types: Cigarettes  . Smokeless tobacco: Never Used  . Alcohol use 14.4 oz/week    24 Glasses of wine per week    Review of  Systems Constitutional: Negative for fever. Eyes: Negative for vision changes ENT:  Negative for congestion, sore throat Cardiovascular: Negative for chest pain. Respiratory: Negative for shortness of breath. Gastrointestinal:Positive for abdominal pain Genitourinary: Negative for dysuria. Musculoskeletal: Negative for back pain. Skin: Negative for rash. Neurological: Negative for headaches, focal weakness or numbness.  All systems negative/normal/unremarkable except as stated in the HPI  ____________________________________________   PHYSICAL EXAM:  VITAL SIGNS: ED Triage Vitals [04/12/17 1013]  Enc Vitals Group     BP      Pulse      Resp      Temp      Temp src      SpO2      Weight 193 lb (87.5 kg)     Height 5\' 9"  (1.753 m)     Head Circumference      Peak Flow      Pain Score 4     Pain Loc      Pain Edu?      Excl. in GC?     Constitutional: Alert and oriented. Well appearing and in no distress. Eyes: Conjunctivae are normal. Normal extraocular movements. ENT   Head: Normocephalic and atraumatic.   Nose: No congestion/rhinnorhea.   Mouth/Throat: Mucous membranes are moist.   Neck: No stridor. Cardiovascular: Normal rate, regular rhythm. No murmurs, rubs, or gallops. Respiratory: Normal respiratory effort without tachypnea nor retractions. Breath sounds are clear and equal bilaterally.  No wheezes/rales/rhonchi. Gastrointestinal: Soft and nontender. High-pitched bowel sounds. Musculoskeletal: Nontender with normal range of motion in extremities. No lower extremity tenderness nor edema. Neurologic:  Normal speech and language. No gross focal neurologic deficits are appreciated.  Skin:  Skin is warm, dry and intact. No rash noted. Psychiatric: Mood and affect are normal. Speech and behavior are normal.  ____________________________________________  EKG: Interpreted by me. Sinus rhythm with a rate of 60 bpm, normal PR interval, normal QRS, normal  QT.  ____________________________________________  ED COURSE:  Pertinent labs & imaging results that were available during my care of the patient were reviewed by me and considered in my medical decision making (see chart for details). Patient presents for abdominal pain, we will assess with labs and imaging as indicated.   Procedures ____________________________________________   LABS (pertinent positives/negatives)  Labs Reviewed  CBC WITH DIFFERENTIAL/PLATELET - Abnormal; Notable for the following:       Result Value   RDW 23.1 (*)    Lymphs Abs 0.9 (*)    All other components within normal limits  COMPREHENSIVE METABOLIC PANEL - Abnormal; Notable for the following:    Sodium 133 (*)    Chloride 98 (*)    Glucose, Bld 107 (*)    ALT 14 (*)    GFR calc non Af Amer 57 (*)    All other components within normal limits  LIPASE, BLOOD - Abnormal; Notable for the following:    Lipase 157 (*)    All other components within normal limits  URINALYSIS, COMPLETE (UACMP) WITH MICROSCOPIC - Abnormal; Notable for the following:    Color, Urine STRAW (*)    APPearance CLEAR (*)    Specific Gravity, Urine 1.004 (*)    All other components within normal limits    RADIOLOGY  CT the abdomen and pelvis with contrast  IMPRESSION: 1. Mild edema/inflammation around the head of the pancreas associated with an 18 mm hypoattenuating lesion in the pancreatic head. While this may be related to focal intraparenchymal edema from focal pancreatitis, hypoenhancing pancreatic lesion such as adenocarcinoma could have this appearance. MRI of the abdomen without and with contrast after resolution of acute symptoms (so as to promote patient ability to remain still and reproducibly breath hold) may prove helpful to further evaluate. 2. 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 3. Aortic Atherosclerois (ICD10-170.0) 4. Bilateral groin hernias contain only fat although the  urinary bladder does bulge out towards the right sided hernia. ____________________________________________  FINAL ASSESSMENT AND PLAN  Abdominal pain, Alcohol-induced pancreatitis  Plan: Patient's labs and imaging were dictated above. Patient had presented for diffuse intermittent abdominal pain likely secondary to alcohol-induced pancreatitis. Lipase was not severely elevated, he has not required any pain medicine doses here. We will give him outpatient detox information for alcohol. He'll be discharged with Librium as well as pain medication and encouraged to have outpatient follow-up.   [name], [place] E, MD   Note: This note was generated in part or whole with voice recognition software. Voice recognition is usually quite accurate but there are transcription errors that can and very often do occur. I apologize for any typographical errors that were not detected and corrected.     [name], Izac Faulkenberry E, MD 04/12/17 [phone]

## 2017-06-19 ENCOUNTER — Telehealth: Payer: Self-pay | Admitting: *Deleted

## 2017-06-19 MED ORDER — GABAPENTIN 400 MG PO CAPS: 400.0000 mg | ORAL_CAPSULE | Freq: Three times a day (TID) | ORAL | 5 refills | Status: DC

## 2017-06-19 NOTE — Telephone Encounter (Signed)
Refill request gabapentin  #90 one tid. Dr. Charlsie Merles ordered refill +5additional, pt needs an appt prior to future refills.

## 2017-12-20 ENCOUNTER — Telehealth: Payer: Self-pay | Admitting: *Deleted

## 2017-12-20 MED ORDER — GABAPENTIN 400 MG PO CAPS: 400.0000 mg | ORAL_CAPSULE | Freq: Three times a day (TID) | ORAL | 0 refills | Status: DC

## 2017-12-20 NOTE — Telephone Encounter (Signed)
Refill gabapentin 400mg  requested. Dr. Charlsie Merlesegal states refill once and pt needs appt.

## 2018-01-08 DIAGNOSIS — R208 Other disturbances of skin sensation: Secondary | ICD-10-CM | POA: Insufficient documentation

## 2018-01-15 DIAGNOSIS — R200 Anesthesia of skin: Secondary | ICD-10-CM | POA: Insufficient documentation

## 2018-01-15 DIAGNOSIS — R202 Paresthesia of skin: Secondary | ICD-10-CM | POA: Insufficient documentation

## 2018-01-15 DIAGNOSIS — R29898 Other symptoms and signs involving the musculoskeletal system: Secondary | ICD-10-CM | POA: Insufficient documentation

## 2019-02-12 ENCOUNTER — Other Ambulatory Visit: Payer: Self-pay | Admitting: Gastroenterology

## 2019-02-12 DIAGNOSIS — K920 Hematemesis: Secondary | ICD-10-CM

## 2019-02-12 DIAGNOSIS — K852 Alcohol induced acute pancreatitis without necrosis or infection: Secondary | ICD-10-CM

## 2019-02-12 DIAGNOSIS — D509 Iron deficiency anemia, unspecified: Secondary | ICD-10-CM

## 2019-02-17 ENCOUNTER — Other Ambulatory Visit: Payer: Self-pay

## 2019-02-17 ENCOUNTER — Ambulatory Visit
Admission: RE | Admit: 2019-02-17 | Discharge: 2019-02-17 | Disposition: A | Discharge status: Home / Self Care | Payer: Medicare Other | Source: Ambulatory Visit | Attending: Gastroenterology | Admitting: Gastroenterology

## 2019-02-17 ENCOUNTER — Encounter (INDEPENDENT_AMBULATORY_CARE_PROVIDER_SITE_OTHER): Payer: Self-pay

## 2019-02-17 DIAGNOSIS — D509 Iron deficiency anemia, unspecified: Secondary | ICD-10-CM | POA: Diagnosis not present

## 2019-02-17 DIAGNOSIS — K852 Alcohol induced acute pancreatitis without necrosis or infection: Secondary | ICD-10-CM | POA: Insufficient documentation

## 2019-02-17 DIAGNOSIS — K920 Hematemesis: Secondary | ICD-10-CM | POA: Insufficient documentation

## 2019-02-17 MED ORDER — IOHEXOL 300 MG/ML  SOLN
100.0000 mL | Freq: Once | INTRAMUSCULAR | Status: AC | PRN
  Administered 2019-02-17: 80 mL via INTRAVENOUS

## 2019-03-02 ENCOUNTER — Other Ambulatory Visit
Admission: RE | Admit: 2019-03-02 | Discharge: 2019-03-02 | Disposition: A | Discharge status: Home / Self Care | Payer: Medicare Other | Source: Ambulatory Visit | Attending: Gastroenterology | Admitting: Gastroenterology

## 2019-03-02 ENCOUNTER — Other Ambulatory Visit: Payer: Self-pay

## 2019-03-02 DIAGNOSIS — Z1159 Encounter for screening for other viral diseases: Secondary | ICD-10-CM | POA: Insufficient documentation

## 2019-03-03 LAB — NOVEL CORONAVIRUS, NAA (HOSP ORDER, SEND-OUT TO REF LAB; TAT 18-24 HRS): SARS-CoV-2, NAA: NOT DETECTED

## 2019-03-04 ENCOUNTER — Encounter: Payer: Self-pay | Admitting: *Deleted

## 2019-03-05 ENCOUNTER — Encounter: Payer: Self-pay | Admitting: *Deleted

## 2019-03-05 ENCOUNTER — Ambulatory Visit
Admission: RE | Admit: 2019-03-05 | Discharge: 2019-03-05 | Disposition: A | Discharge status: Home / Self Care | Payer: Medicare Other | Attending: Gastroenterology | Admitting: Gastroenterology

## 2019-03-05 ENCOUNTER — Encounter
Admission: RE | Disposition: A | Discharge status: Home / Self Care | Payer: Self-pay | Source: Home / Self Care | Attending: Gastroenterology

## 2019-03-05 ENCOUNTER — Other Ambulatory Visit: Payer: Self-pay

## 2019-03-05 ENCOUNTER — Ambulatory Visit: Payer: Medicare Other | Admitting: Anesthesiology

## 2019-03-05 DIAGNOSIS — I509 Heart failure, unspecified: Secondary | ICD-10-CM | POA: Insufficient documentation

## 2019-03-05 DIAGNOSIS — K5730 Diverticulosis of large intestine without perforation or abscess without bleeding: Secondary | ICD-10-CM | POA: Diagnosis not present

## 2019-03-05 DIAGNOSIS — R195 Other fecal abnormalities: Secondary | ICD-10-CM | POA: Diagnosis not present

## 2019-03-05 DIAGNOSIS — Z79899 Other long term (current) drug therapy: Secondary | ICD-10-CM | POA: Diagnosis not present

## 2019-03-05 DIAGNOSIS — I4891 Unspecified atrial fibrillation: Secondary | ICD-10-CM | POA: Insufficient documentation

## 2019-03-05 DIAGNOSIS — K2960 Other gastritis without bleeding: Secondary | ICD-10-CM | POA: Insufficient documentation

## 2019-03-05 DIAGNOSIS — Z951 Presence of aortocoronary bypass graft: Secondary | ICD-10-CM | POA: Diagnosis not present

## 2019-03-05 DIAGNOSIS — Z8719 Personal history of other diseases of the digestive system: Secondary | ICD-10-CM | POA: Diagnosis not present

## 2019-03-05 DIAGNOSIS — I130 Hypertensive heart and chronic kidney disease with heart failure and stage 1 through stage 4 chronic kidney disease, or unspecified chronic kidney disease: Secondary | ICD-10-CM | POA: Insufficient documentation

## 2019-03-05 DIAGNOSIS — D509 Iron deficiency anemia, unspecified: Secondary | ICD-10-CM | POA: Diagnosis not present

## 2019-03-05 DIAGNOSIS — Q600 Renal agenesis, unilateral: Secondary | ICD-10-CM | POA: Diagnosis not present

## 2019-03-05 DIAGNOSIS — I252 Old myocardial infarction: Secondary | ICD-10-CM | POA: Diagnosis not present

## 2019-03-05 DIAGNOSIS — K640 First degree hemorrhoids: Secondary | ICD-10-CM | POA: Insufficient documentation

## 2019-03-05 DIAGNOSIS — Z87891 Personal history of nicotine dependence: Secondary | ICD-10-CM | POA: Insufficient documentation

## 2019-03-05 HISTORY — PX: ESOPHAGOGASTRODUODENOSCOPY (EGD) WITH PROPOFOL: SHX5813

## 2019-03-05 HISTORY — PX: COLONOSCOPY WITH PROPOFOL: SHX5780

## 2019-03-05 HISTORY — DX: Reserved for concepts with insufficient information to code with codable children: IMO0002

## 2019-03-05 SURGERY — COLONOSCOPY WITH PROPOFOL
Anesthesia: General

## 2019-03-05 MED ORDER — SODIUM CHLORIDE 0.9 % IV SOLN
INTRAVENOUS | Status: DC
  Administered 2019-03-05: 07:00:00 via INTRAVENOUS

## 2019-03-05 MED ORDER — MIDAZOLAM HCL 2 MG/2ML IJ SOLN
INTRAMUSCULAR | Status: AC
  Filled 2019-03-05: qty 2

## 2019-03-05 MED ORDER — PROPOFOL 500 MG/50ML IV EMUL
INTRAVENOUS | Status: DC | PRN
  Administered 2019-03-05: 120 ug/kg/min via INTRAVENOUS

## 2019-03-05 MED ORDER — MIDAZOLAM HCL 5 MG/5ML IJ SOLN
INTRAMUSCULAR | Status: DC | PRN
  Administered 2019-03-05: 2 mg via INTRAVENOUS

## 2019-03-05 MED ORDER — PHENYLEPHRINE HCL (PRESSORS) 10 MG/ML IV SOLN
INTRAVENOUS | Status: DC | PRN
  Administered 2019-03-05: 100 ug via INTRAVENOUS

## 2019-03-05 MED ORDER — PROPOFOL 500 MG/50ML IV EMUL
INTRAVENOUS | Status: AC
  Filled 2019-03-05: qty 50

## 2019-03-05 MED ORDER — LIDOCAINE HCL (PF) 2 % IJ SOLN
INTRAMUSCULAR | Status: AC
  Filled 2019-03-05: qty 10

## 2019-03-05 MED ORDER — GLYCOPYRROLATE 0.2 MG/ML IJ SOLN
INTRAMUSCULAR | Status: AC
  Filled 2019-03-05: qty 1

## 2019-03-05 MED ORDER — LIDOCAINE 2% (20 MG/ML) 5 ML SYRINGE
INTRAMUSCULAR | Status: DC | PRN
  Administered 2019-03-05: 25 mg via INTRAVENOUS

## 2019-03-05 MED ORDER — PROPOFOL 10 MG/ML IV BOLUS
INTRAVENOUS | Status: DC | PRN
  Administered 2019-03-05: 70 mg via INTRAVENOUS
  Administered 2019-03-05: 30 mg via INTRAVENOUS

## 2019-03-05 MED ORDER — GLYCOPYRROLATE 0.2 MG/ML IJ SOLN
INTRAMUSCULAR | Status: DC | PRN
  Administered 2019-03-05: 0.2 mg via INTRAVENOUS

## 2019-03-05 NOTE — Anesthesia Preprocedure Evaluation (Signed)
Anesthesia Evaluation  Patient identified by MRN, date of birth, ID band Patient awake    Reviewed: Allergy & Precautions, NPO status , Patient's Chart, lab work & pertinent test results, reviewed documented beta blocker date and time   Airway Mallampati: III  TM Distance: >3 FB     Dental  (+) Chipped, Partial Upper, Partial Lower   Pulmonary former smoker,           Cardiovascular hypertension, Pt. on medications and Pt. on home beta blockers + Past MI, + CABG and +CHF       Neuro/Psych    GI/Hepatic   Endo/Other    Renal/GU Renal disease     Musculoskeletal   Abdominal   Peds  Hematology  (+) anemia ,   Anesthesia Other Findings EKG checked and ok.  Reproductive/Obstetrics                             Anesthesia Physical Anesthesia Plan  ASA: III  Anesthesia Plan: General   Post-op Pain Management:    Induction: Intravenous  PONV Risk Score and Plan:   Airway Management Planned:   Additional Equipment:   Intra-op Plan:   Post-operative Plan:   Informed Consent: I have reviewed the patients History and Physical, chart, labs and discussed the procedure including the risks, benefits and alternatives for the proposed anesthesia with the patient or authorized representative who has indicated his/her understanding and acceptance.       Plan Discussed with: CRNA  Anesthesia Plan Comments:         Anesthesia Quick Evaluation

## 2019-03-05 NOTE — Anesthesia Post-op Follow-up Note (Signed)
Anesthesia QCDR form completed.        

## 2019-03-05 NOTE — Transfer of Care (Signed)
Immediate Anesthesia Transfer of Care Note  Patient: Danny Marquez  Procedure(s) Performed: COLONOSCOPY WITH PROPOFOL (N/A ) ESOPHAGOGASTRODUODENOSCOPY (EGD) WITH PROPOFOL (N/A )  Patient Location: PACU and Endoscopy Unit  Anesthesia Type:General  Level of Consciousness: awake  Airway & Oxygen Therapy: Patient Spontanous Breathing and Patient connected to nasal cannula oxygen  Post-op Assessment: Report given to RN and Post -op Vital signs reviewed and stable  Post vital signs: Reviewed  Last Vitals:  Vitals Value Taken Time  BP 94/59 03/05/19 0841  Temp 36.2 C 03/05/19 0835  Pulse 82 03/05/19 0841  Resp 17 03/05/19 0841  SpO2 98 % 03/05/19 0841    Last Pain:  Vitals:   03/05/19 0835  TempSrc: Tympanic         Complications: No apparent anesthesia complications

## 2019-03-05 NOTE — H&P (Signed)
Outpatient short stay form Pre-procedure 03/05/2019 7:35 AM Christena DeemMartin U Skulskie MD  Primary Physician: Dr Dewaine Oatsenny Tate  Reason for visit: EGD and colonoscopy  History of present illness: Patient is a 73 year old male presenting today for an EGD and colonoscopy in regards to recent finding of Hemoccult positive stool and iron deficiency anemia.  He last had a EGD and colonoscopy on 08/03/2013 showing at that time a pyloric erosion and a single hyperplastic polyp.  States that he does not see black stools have diarrhea or blood in the stool with the exception of a trace of blood seen on toilet paper after bowel movement.  Tolerated his prep well.  He takes no aspirin or blood thinning agent.  Patient does have a history of atrial fibrillation his rate is within normal today, case discussed with anesthesia.    Current Facility-Administered Medications:  .  0.9 %  sodium chloride infusion, , Intravenous, Continuous, Christena DeemSkulskie, Martin U, MD, Last Rate: 20 mL/hr at 03/05/19 0720  Medications Prior to Admission  Medication Sig Dispense Refill Last Dose  . amiodarone (PACERONE) 200 MG tablet Take 400 mg by mouth 2 (two) times daily.   03/05/2019 at Unknown time  . furosemide (LASIX) 40 MG tablet Take 40mg s by mouth once daily, may take an additional 40mg s once daily as needed for swelling or weight gain   03/04/2019 at Unknown time  . gabapentin (NEURONTIN) 400 MG capsule Take 1 capsule (400 mg total) by mouth 3 (three) times daily. 90 capsule 5 03/04/2019 at Unknown time  . Melatonin 10 MG TABS Take 10 mg by mouth at bedtime.   03/04/2019 at Unknown time  . metoprolol (LOPRESSOR) 50 MG tablet Take 1 tablet (50 mg total) by mouth 2 (two) times daily. 60 tablet 0 03/04/2019 at Unknown time  . potassium chloride SA (KLOR-CON M20) 20 MEQ tablet TAKE 1 TABLET (20 MEQ TOTAL) BY MOUTH ONCE DAILY. AND EXTRA TABLET IF TAKES 2ND TABLET OF LASIX   03/04/2019 at Unknown time  . sertraline (ZOLOFT) 100 MG tablet Take 100 mg  by mouth daily.    03/04/2019 at Unknown time  . zolpidem (AMBIEN) 10 MG tablet Take 10 mg by mouth at bedtime.    03/04/2019 at Unknown time  . acetaminophen (TYLENOL) 500 MG tablet Take 1,000-1,500 mg by mouth 2 (two) times daily as needed for moderate pain or headache.     . chlordiazePOXIDE (LIBRIUM) 25 MG capsule Take 4 tablets a day on day 1, and decreased by 1 tablet daily until gone. (Patient not taking: Reported on 03/05/2019) 12 capsule 0 Completed Course at Unknown time  . ferrous sulfate 325 (65 FE) MG EC tablet Take 1 tablet (325 mg total) by mouth 2 (two) times daily. 60 tablet 3   . gabapentin (NEURONTIN) 400 MG capsule Take 1 capsule (400 mg total) by mouth 3 (three) times daily. 90 capsule 0   . Naphazoline HCl (CLEAR EYES OP) Apply 1 drop to eye daily as needed (dry eyes).     Marland Kitchen. omeprazole (PRILOSEC) 20 MG capsule Take 20 mg by mouth daily.     . ondansetron (ZOFRAN ODT) 4 MG disintegrating tablet Take 1 tablet (4 mg total) by mouth every 8 (eight) hours as needed for nausea or vomiting. 20 tablet 0   . ramipril (ALTACE) 5 MG capsule Take 5 mg by mouth daily.        Allergies  Allergen Reactions  . Codeine Nausea Only    dizzy  . Rosuvastatin  Other (See Comments)    Myalgia      Past Medical History:  Diagnosis Date  . A-fib ([place])   . CHF (congestive heart failure) (HCC)    EF 30%  . Chronic kidney disease    congenital one kidney  . Hypertension   . Myocardial infarction ([place])   . Solitary kidney     Review of systems:      Physical Exam    Heart and lungs: Rhythm without rub or gallop lungs are bilaterally clear    HEENT: Normocephalic atraumatic eyes are anicteric    Other:    Pertinant exam for procedure: Soft nontender nondistended bowel sounds positive normoactive    Planned proceedures: EGD, colonoscopy and indicated procedures. I have discussed the risks benefits and complications of procedures to include not limited to bleeding, infection,  perforation and the risk of sedation and the patient wishes to proceed.    Lollie Sails, MD Gastroenterology 03/05/2019  7:35 AM

## 2019-03-05 NOTE — Anesthesia Postprocedure Evaluation (Signed)
Anesthesia Post Note  Patient: Danny Marquez  Procedure(s) Performed: COLONOSCOPY WITH PROPOFOL (N/A ) ESOPHAGOGASTRODUODENOSCOPY (EGD) WITH PROPOFOL (N/A )  Patient location during evaluation: Endoscopy Anesthesia Type: General Level of consciousness: awake and alert Pain management: pain level controlled Vital Signs Assessment: post-procedure vital signs reviewed and stable Respiratory status: spontaneous breathing, nonlabored ventilation, respiratory function stable and patient connected to nasal cannula oxygen Cardiovascular status: blood pressure returned to baseline and stable Postop Assessment: no apparent nausea or vomiting Anesthetic complications: no     Last Vitals:  Vitals:   03/05/19 0850 03/05/19 0858  BP: (!) 102/56 106/73  Pulse: 80 93  Resp: 16 14  Temp:    SpO2: 98% 96%    Last Pain:  Vitals:   03/05/19 0850  TempSrc:   PainSc: 0-No pain                 [employer]

## 2019-03-05 NOTE — Op Note (Signed)
[facility] Gastroenterology Patient Name: Danny LitterRichard Nealy Procedure Date: 03/05/2019 7:18 AM MRN: [id number] Account #: [id number] Date of Birth: 02/09/1946 Admit Type: Outpatient Age: [age] [facility] Gender: Male Note Status: Finalized Procedure:            Colonoscopy Indications:          Heme positive stool, Iron deficiency anemia Providers:            Christena DeemMartin U. Coby Shrewsberry, MD Medicines:            Monitored Anesthesia Care Complications:        No immediate complications. Procedure:            Pre-Anesthesia Assessment:                       - ASA Grade Assessment: III - A patient with severe                        systemic disease.                       After obtaining informed consent, the colonoscope was                        passed under direct vision. Throughout the procedure,                        the patient's blood pressure, pulse, and oxygen                        saturations were monitored continuously. The                        Colonoscope was introduced through the anus and                        advanced to the the cecum, identified by appendiceal                        orifice and ileocecal valve. The colonoscopy was                        performed without difficulty. The patient tolerated the                        procedure well. The quality of the bowel preparation                        was good. Findings:      Many small and large-mouthed diverticula were found in the sigmoid       colon, descending colon and transverse colon.      Non-bleeding internal hemorrhoids were found during retroflexion and       during anoscopy. The hemorrhoids were small and Grade I (internal       hemorrhoids that do not prolapse).      The digital rectal exam was normal. Impression:           - Diverticulosis in the sigmoid colon, in the                        descending colon and in the  transverse colon.                       - Non-bleeding  internal hemorrhoids.                       - No specimens collected. Recommendation:       - Discharge patient to home. Procedure Code(s):    --- Professional ---                       [phone], Colonoscopy, flexible; diagnostic, including                        collection of specimen(s) by brushing or washing, when                        performed (separate procedure) Diagnosis Code(s):    --- Professional ---                       K64.0, First degree hemorrhoids                       R19.5, Other fecal abnormalities                       D50.9, Iron deficiency anemia, unspecified                       K57.30, Diverticulosis of large intestine without                        perforation or abscess without bleeding CPT copyright 2019 American Medical Association. All rights reserved. The codes documented in this report are preliminary and upon coder review may  be revised to meet current compliance requirements. Lollie Sails, MD 03/05/2019 8:34:38 AM This report has been signed electronically. Number of Addenda: 0 Note Initiated On: 03/05/2019 7:18 AM Scope Withdrawal Time: 0 hours 5 minutes 19 seconds  Total Procedure Duration: 0 hours 12 minutes 19 seconds       [employer]

## 2019-03-05 NOTE — Op Note (Signed)
[facility] Gastroenterology Patient Name: Danny LitterRichard Marquez Procedure Date: 03/05/2019 7:18 AM MRN: [id number] Account #: [id number] Date of Birth: [birth year] Admit Type: Outpatient Age: 89 [facility] Gender: Male Note Status: Finalized Procedure:            Upper GI endoscopy Indications:          Iron deficiency anemia, Heme positive stool Providers:            Christena DeemMartin U. [place], MD Medicines:            Monitored Anesthesia Care Complications:        No immediate complications. Procedure:            Pre-Anesthesia Assessment:                       - ASA Grade Assessment: III - A patient with severe                        systemic disease.                       After obtaining informed consent, the endoscope was                        passed under direct vision. Throughout the procedure,                        the patient's blood pressure, pulse, and oxygen                        saturations were monitored continuously. The Endoscope                        was introduced through the mouth, and advanced to the                        third part of duodenum. The upper GI endoscopy was                        accomplished without difficulty. The patient tolerated                        the procedure well. Findings:      The Z-line was variable. Biopsies were taken with a cold forceps for       histology.      The exam of the esophagus was otherwise normal.      Patchy mild inflammation characterized by congestion (edema) and       erythema was found in the gastric body and in the gastric antrum.       Biopsies were taken with a cold forceps for histology from the antrum       and body placed into separate jars..      Patchy friable mucosa with a small amount of blood noted in the lumen,       however no distinct bleeding location was found in the duodenal bulb and       in the second, third portion of the duodenum. There was no evidence of   accumulated blood noted in the distal 3rd to second portion of the       duodenum.      The  cardia and gastric fundus were normal on retroflexion. Impression:           - Z-line variable. Biopsied.                       - Bile gastritis. Biopsied.                       - With a smnall amount of blood noted in the lumen,                        however no distinct bleeding r friable duodenal mucosa. Recommendation:       - Use Protonix (pantoprazole) 40 mg PO daily daily.                       - Use sucralfate tablets 1 gram PO QID for 6 weeks.                       - Use sucralfate tablets 1 gram PO BID for maintenance. Procedure Code(s):    --- Professional ---                       [phone], Esophagogastroduodenoscopy, flexible, transoral;                        with biopsy, single or multiple Diagnosis Code(s):    --- Professional ---                       K22.8, Other specified diseases of esophagus                       K29.60, Other gastritis without bleeding                       K31.89, Other diseases of stomach and duodenum                       D50.9, Iron deficiency anemia, unspecified                       R19.5, Other fecal abnormalities CPT copyright 2019 American Medical Association. All rights reserved. The codes documented in this report are preliminary and upon coder review may  be revised to meet current compliance requirements. Lollie Sails, MD 03/05/2019 8:15:14 AM This report has been signed electronically. Number of Addenda: 0 Note Initiated On: 03/05/2019 7:18 AM      [facility]

## 2019-03-06 ENCOUNTER — Encounter: Payer: Self-pay | Admitting: Gastroenterology

## 2019-03-09 LAB — SURGICAL PATHOLOGY

## 2019-03-23 DIAGNOSIS — Z9889 Other specified postprocedural states: Secondary | ICD-10-CM | POA: Insufficient documentation

## 2019-03-23 DIAGNOSIS — I252 Old myocardial infarction: Secondary | ICD-10-CM | POA: Insufficient documentation

## 2019-03-23 DIAGNOSIS — G8929 Other chronic pain: Secondary | ICD-10-CM | POA: Insufficient documentation

## 2019-03-23 DIAGNOSIS — Z981 Arthrodesis status: Secondary | ICD-10-CM | POA: Insufficient documentation

## 2019-03-23 DIAGNOSIS — Z8679 Personal history of other diseases of the circulatory system: Secondary | ICD-10-CM | POA: Insufficient documentation

## 2019-03-23 DIAGNOSIS — M48061 Spinal stenosis, lumbar region without neurogenic claudication: Secondary | ICD-10-CM | POA: Insufficient documentation

## 2019-03-23 DIAGNOSIS — M48062 Spinal stenosis, lumbar region with neurogenic claudication: Secondary | ICD-10-CM | POA: Insufficient documentation

## 2019-03-23 DIAGNOSIS — Z951 Presence of aortocoronary bypass graft: Secondary | ICD-10-CM | POA: Insufficient documentation

## 2019-03-23 DIAGNOSIS — I255 Ischemic cardiomyopathy: Secondary | ICD-10-CM | POA: Insufficient documentation

## 2019-03-23 DIAGNOSIS — I2510 Atherosclerotic heart disease of native coronary artery without angina pectoris: Secondary | ICD-10-CM | POA: Insufficient documentation

## 2019-03-23 DIAGNOSIS — Z8719 Personal history of other diseases of the digestive system: Secondary | ICD-10-CM | POA: Insufficient documentation

## 2019-03-23 DIAGNOSIS — K219 Gastro-esophageal reflux disease without esophagitis: Secondary | ICD-10-CM | POA: Insufficient documentation

## 2019-03-24 ENCOUNTER — Ambulatory Visit (INDEPENDENT_AMBULATORY_CARE_PROVIDER_SITE_OTHER): Payer: Medicare Other

## 2019-03-24 ENCOUNTER — Encounter: Payer: Self-pay | Admitting: Podiatry

## 2019-03-24 ENCOUNTER — Other Ambulatory Visit: Payer: Self-pay

## 2019-03-24 ENCOUNTER — Ambulatory Visit (INDEPENDENT_AMBULATORY_CARE_PROVIDER_SITE_OTHER): Payer: Medicare Other | Admitting: Podiatry

## 2019-03-24 ENCOUNTER — Telehealth: Payer: Self-pay | Admitting: Podiatry

## 2019-03-24 VITALS — Temp 96.1°F

## 2019-03-24 DIAGNOSIS — M21612 Bunion of left foot: Secondary | ICD-10-CM | POA: Diagnosis not present

## 2019-03-24 DIAGNOSIS — M2041 Other hammer toe(s) (acquired), right foot: Secondary | ICD-10-CM | POA: Diagnosis not present

## 2019-03-24 DIAGNOSIS — M2042 Other hammer toe(s) (acquired), left foot: Secondary | ICD-10-CM

## 2019-03-24 DIAGNOSIS — M21611 Bunion of right foot: Secondary | ICD-10-CM | POA: Diagnosis not present

## 2019-03-24 NOTE — Patient Instructions (Signed)
Pre-Operative Instructions  Congratulations, you have decided to take an important step towards improving your quality of life.  You can be assured that the doctors and staff at [facility] will be with you every step of the way.  Here are some important things you should know:  1. Plan to be at the surgery center/hospital at least 1 (one) hour prior to your scheduled time, unless otherwise directed by the surgical center/hospital staff.  You must have a responsible adult accompany you, remain during the surgery and drive you home.  Make sure you have directions to the surgical center/hospital to ensure you arrive on time. 2. If you are having surgery at [facility] or [place] hospitals, you will need a copy of your medical history and physical form from your family physician within one month prior to the date of surgery. We will give you a form for your primary physician to complete.  3. We make every effort to accommodate the date you request for surgery.  However, there are times where surgery dates or times have to be moved.  We will contact you as soon as possible if a change in schedule is required.   4. No aspirin/ibuprofen for one week before surgery.  If you are on aspirin, any non-steroidal anti-inflammatory medications (Mobic, Aleve, Ibuprofen) should not be taken seven (7) days prior to your surgery.  You make take Tylenol for pain prior to surgery.  5. Medications - If you are taking daily heart and blood pressure medications, seizure, reflux, allergy, asthma, anxiety, pain or diabetes medications, make sure you notify the surgery center/hospital before the day of surgery so they can tell you which medications you should take or avoid the day of surgery. 6. No food or drink after midnight the night before surgery unless directed otherwise by surgical center/hospital staff. 7. No alcoholic beverages 24-hours prior to surgery.  No smoking 24-hours prior or 24-hours after  surgery. 8. Wear loose pants or shorts. They should be loose enough to fit over bandages, boots, and casts. 9. Don't wear slip-on shoes. Sneakers are preferred. 10. Bring your boot with you to the surgery center/hospital.  Also bring crutches or a walker if your physician has prescribed it for you.  If you do not have this equipment, it will be provided for you after surgery. 11. If you have not been contacted by the surgery center/hospital by the day before your surgery, call to confirm the date and time of your surgery. 12. Leave-time from work may vary depending on the type of surgery you have.  Appropriate arrangements should be made prior to surgery with your employer. 13. Prescriptions will be provided immediately following surgery by your doctor.  Fill these as soon as possible after surgery and take the medication as directed. Pain medications will not be refilled on weekends and must be approved by the doctor. 14. Remove nail polish on the operative foot and avoid getting pedicures prior to surgery. 15. Wash the night before surgery.  The night before surgery wash the foot and leg well with water and the antibacterial soap provided. Be sure to pay special attention to beneath the toenails and in between the toes.  Wash for at least three (3) minutes. Rinse thoroughly with water and dry well with a towel.  Perform this wash unless told not to do so by your physician.  Enclosed: 1 Ice pack (please put in freezer the night before surgery)   1 Hibiclens skin cleaner     Pre-op instructions  If you have any questions regarding the instructions, please do not hesitate to call our office.  [place]: [address], [place], [place] [address] -- [phone]  [place]: [address] [place], [place] [address] -- [phone]  [place]: [address]  [place], [place] [address] -- [phone]  [place]: [address], [address], [place], [place] [address] -- [phone]  Website:  [email or web address] 

## 2019-03-24 NOTE — Telephone Encounter (Signed)
Pt's wife called saying pt saw Dr. Amalia Hailey today and needs to get scheduled for surgery. I told Mrs. Salsgiver once I received paperwork from the [place] office I would call her on her cell phone to schedule his surgery.

## 2019-03-26 NOTE — Progress Notes (Signed)
   Subjective: 73 year old male presenting today with a chief complaint of constant foot pain bilaterally that has been present for the past few years. He reports burning pain under the toes and states the toes are crooked. He has not had any recent treatment for his symptoms. He has been taking Allopurinol, Colchicine and Lyrica for treatment. Patient is here for further evaluation and treatment.   Past Medical History:  Diagnosis Date  . A-fib ([place])   . CHF (congestive heart failure) ([facility])    EF 30%  . Chronic kidney disease    congenital one kidney  . Hypertension   . Myocardial infarction ([place])   . Solitary kidney      Objective: Physical Exam General: The patient is alert and oriented x3 in no acute distress.  Dermatology: Skin is cool, dry and supple bilateral lower extremities. Negative for open lesions or macerations.  Vascular: Palpable pedal pulses bilaterally. No edema or erythema noted. Capillary refill within normal limits.  Neurological: Epicritic and protective threshold diminished bilaterally.   Musculoskeletal Exam: Clinical evidence of bunion deformity noted to the respective foot. There is moderate pain on palpation range of motion of the first MPJ. Lateral deviation of the hallux noted consistent with hallux abductovalgus. Hammertoe contracture also noted on clinical exam to the 2nd digit of the left foot. Symptomatic pain on palpation and range of motion also noted to the metatarsal phalangeal joints of the respective hammertoe digits.    Radiographic Exam: Increased intermetatarsal angle greater than 15 with a hallux abductus angle greater than 30 noted on AP view. Moderate degenerative changes noted within the first MPJ. Contracture deformity also noted to the interphalangeal joints and MPJs of the digits of the respective hammertoes.   Assessment: 1. 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 H/o  gout 4. Idiopathic peripheral neuropathy BLE    Plan of Care:  1. Patient was evaluated. X-Rays reviewed. 2. Today we discussed the conservative versus surgical management of the presenting pathology. The patient opts for surgical management. All possible complications and details of the procedure were explained. All patient questions were answered. No guarantees were expressed or implied. 3. Authorization for surgery was initiated today. Surgery will consist of bunionectomy with osteotomy right.  4. Patient will need medical clearance from PCP and cardiology prior to surgery.  5. Continue taking Allopurinol and Colchicine as needed as directed by PCP for gout.  6. Continue taking Lyrica as directed by PCP for neuropathy.  7. Return to clinic one week post op.    Edrick Kins, DPM [facility]  Dr. Edrick Kins, [address]                                        [place], [place] [address]                Office [phone]  Fax [phone]

## 2019-03-30 ENCOUNTER — Telehealth: Payer: Self-pay | Admitting: *Deleted

## 2019-03-30 NOTE — Telephone Encounter (Signed)
Calling to get the date of my husbands surgery. Please call me back at [phone].

## 2019-03-30 NOTE — Telephone Encounter (Signed)
"  I'm calling to schedule surgery for my husband."  Dr. Amalia Hailey' next available date is May 14, 2019.  "He told us he was about a month out.  Go ahead and put him down for then.  Is there anything else we need to do?"  He needs to go online and register with the surgical center, the instructions are in the brochure that we gave him.  It says [employer].  Someone from the surgical center will give him a call a day or two prior to his surgery date and will give him his arrival time.  What's the name of his Cardiologist?  Dr. Amalia Hailey wants medical clearance.  "It's Dr. Ubaldo Glassing.  He just saw him last week."    I sent a surgical medical clearance request letter to Dr. Ubaldo Glassing.

## 2019-04-07 ENCOUNTER — Encounter: Payer: Self-pay | Admitting: *Deleted

## 2019-04-07 NOTE — Progress Notes (Signed)
Per Dr. Amalia Hailey, I sent a surgical medical clearance request letter to Dr. Ubaldo Glassing.  Danny Marquez is scheduled to have surgery on May 14, 2019.

## 2019-04-23 ENCOUNTER — Telehealth: Payer: Self-pay | Admitting: *Deleted

## 2019-04-23 NOTE — Telephone Encounter (Addendum)
"  [name] wife just called and stated she wanted to cancel her husband's surgery.  She said he needs surgery on his back so he's going to have that done first.  She said he would call back to reschedule it once he's feeling better."  I will cancel the surgery.    I canceled the surgery that was scheduled for 05/14/2019 via the surgical center's [facility] [employer] Portal.  (Disregard Dr. Milinda Pointer.)

## 2019-04-27 ENCOUNTER — Other Ambulatory Visit: Payer: Self-pay | Admitting: Gastroenterology

## 2019-04-27 DIAGNOSIS — K861 Other chronic pancreatitis: Secondary | ICD-10-CM

## 2019-05-07 ENCOUNTER — Ambulatory Visit: Payer: Medicare Other

## 2019-08-06 ENCOUNTER — Emergency Department: Payer: Medicare Other

## 2019-08-06 ENCOUNTER — Encounter: Payer: Self-pay | Admitting: *Deleted

## 2019-08-06 ENCOUNTER — Emergency Department
Admission: EM | Admit: 2019-08-06 | Discharge: 2019-08-06 | Disposition: A | Discharge status: Home / Self Care | Payer: Medicare Other | Attending: Emergency Medicine | Admitting: Emergency Medicine

## 2019-08-06 ENCOUNTER — Other Ambulatory Visit: Payer: Self-pay

## 2019-08-06 DIAGNOSIS — R410 Disorientation, unspecified: Secondary | ICD-10-CM | POA: Insufficient documentation

## 2019-08-06 DIAGNOSIS — Z96611 Presence of right artificial shoulder joint: Secondary | ICD-10-CM | POA: Diagnosis not present

## 2019-08-06 DIAGNOSIS — I110 Hypertensive heart disease with heart failure: Secondary | ICD-10-CM | POA: Insufficient documentation

## 2019-08-06 DIAGNOSIS — I2510 Atherosclerotic heart disease of native coronary artery without angina pectoris: Secondary | ICD-10-CM | POA: Diagnosis not present

## 2019-08-06 DIAGNOSIS — Z79899 Other long term (current) drug therapy: Secondary | ICD-10-CM | POA: Diagnosis not present

## 2019-08-06 DIAGNOSIS — W19XXXA Unspecified fall, initial encounter: Secondary | ICD-10-CM

## 2019-08-06 DIAGNOSIS — I5022 Chronic systolic (congestive) heart failure: Secondary | ICD-10-CM | POA: Insufficient documentation

## 2019-08-06 DIAGNOSIS — R112 Nausea with vomiting, unspecified: Secondary | ICD-10-CM | POA: Diagnosis not present

## 2019-08-06 DIAGNOSIS — Z87891 Personal history of nicotine dependence: Secondary | ICD-10-CM | POA: Insufficient documentation

## 2019-08-06 DIAGNOSIS — Z043 Encounter for examination and observation following other accident: Secondary | ICD-10-CM | POA: Insufficient documentation

## 2019-08-06 DIAGNOSIS — R197 Diarrhea, unspecified: Secondary | ICD-10-CM | POA: Insufficient documentation

## 2019-08-06 DIAGNOSIS — K859 Acute pancreatitis without necrosis or infection, unspecified: Secondary | ICD-10-CM | POA: Diagnosis not present

## 2019-08-06 DIAGNOSIS — I951 Orthostatic hypotension: Secondary | ICD-10-CM | POA: Insufficient documentation

## 2019-08-06 DIAGNOSIS — R1110 Vomiting, unspecified: Secondary | ICD-10-CM

## 2019-08-06 DIAGNOSIS — Z951 Presence of aortocoronary bypass graft: Secondary | ICD-10-CM | POA: Insufficient documentation

## 2019-08-06 LAB — COMPREHENSIVE METABOLIC PANEL
ALT: 15 U/L (ref 0–44)
AST: 42 U/L — ABNORMAL HIGH (ref 15–41)
Albumin: 3.6 g/dL (ref 3.5–5.0)
Alkaline Phosphatase: 65 U/L (ref 38–126)
Anion gap: 11 (ref 5–15)
BUN: 10 mg/dL (ref 8–23)
CO2: 23 mmol/L (ref 22–32)
Calcium: 9.7 mg/dL (ref 8.9–10.3)
Chloride: 96 mmol/L — ABNORMAL LOW (ref 98–111)
Creatinine, Ser: 0.88 mg/dL (ref 0.61–1.24)
GFR calc Af Amer: >60 mL/min (ref >60)
GFR calc non Af Amer: >60 mL/min (ref >60)
Glucose, Bld: 127 mg/dL — ABNORMAL HIGH (ref 70–99)
Potassium: 3.6 mmol/L (ref 3.5–5.1)
Sodium: 130 mmol/L — ABNORMAL LOW (ref 135–145)
Total Bilirubin: 0.8 mg/dL (ref 0.3–1.2)
Total Protein: 7 g/dL (ref 6.5–8.1)

## 2019-08-06 LAB — URINALYSIS, COMPLETE (UACMP) WITH MICROSCOPIC
Bacteria, UA: NONE SEEN
Bilirubin Urine: NEGATIVE
Glucose, UA: NEGATIVE mg/dL
Hgb urine dipstick: NEGATIVE
Ketones, ur: NEGATIVE mg/dL
Leukocytes,Ua: NEGATIVE
Nitrite: NEGATIVE
Protein, ur: 100 mg/dL — AB
Specific Gravity, Urine: 1.02 (ref 1.005–1.030)
Squamous Epithelial / HPF: NONE SEEN (ref 0–5)
pH: 6 (ref 5.0–8.0)

## 2019-08-06 LAB — CBC
HCT: 41.6 % (ref 39.0–52.0)
Hemoglobin: 13.1 g/dL (ref 13.0–17.0)
MCH: 26.4 pg (ref 26.0–34.0)
MCHC: 31.5 g/dL (ref 30.0–36.0)
MCV: 83.9 fL (ref 80.0–100.0)
Platelets: 255 10*3/uL (ref 150–400)
RBC: 4.96 MIL/uL (ref 4.22–5.81)
RDW: 13.9 % (ref 11.5–15.5)
WBC: 8.9 10*3/uL (ref 4.0–10.5)
nRBC: 0 % (ref 0.0–0.2)

## 2019-08-06 LAB — LACTIC ACID, PLASMA
Lactic Acid, Venous: 1.2 mmol/L (ref 0.5–1.9)
Lactic Acid, Venous: 1.1 mmol/L (ref 0.5–1.9)

## 2019-08-06 LAB — LIPASE, BLOOD: Lipase: 356 U/L — ABNORMAL HIGH (ref 11–51)

## 2019-08-06 LAB — AMMONIA: Ammonia: 29 umol/L (ref 9–35)

## 2019-08-06 LAB — TROPONIN I (HIGH SENSITIVITY)
Troponin I (High Sensitivity): 52 ng/L — ABNORMAL HIGH (ref <18)
Troponin I (High Sensitivity): 49 ng/L — ABNORMAL HIGH (ref <18)
Troponin I (High Sensitivity): 44 ng/L — ABNORMAL HIGH (ref <18)

## 2019-08-06 MED ORDER — SODIUM CHLORIDE 0.9% FLUSH: 3.0000 mL | Freq: Once | INTRAVENOUS | Status: DC

## 2019-08-06 MED ORDER — ONDANSETRON 4 MG PO TBDP: 4.0000 mg | ORAL_TABLET | Freq: Three times a day (TID) | ORAL | 0 refills | Status: DC | PRN

## 2019-08-06 MED ORDER — SODIUM CHLORIDE 0.9 % IV BOLUS
1000.0000 mL | Freq: Once | INTRAVENOUS | Status: AC
  Administered 2019-08-06: 1000 mL via INTRAVENOUS

## 2019-08-06 NOTE — ED Triage Notes (Signed)
  Pt spouse reporting pt has had vomiting and diarrhea since yesterday, denies fevers. Tonight, she heard what she thought was falling and went into the room and pt was lying on the floor and he was disoriented.   Pt is alert and oriented, answering questions appropriately. MAE x 4, clear speech. Reporting he had a fall tonight after slipping on his bedroom slipper and has had shoulder pain since then. Denies hitting his head or LOC.

## 2019-08-06 NOTE — ED Triage Notes (Signed)
Pt arrives via [facility] from home. Wife called out for AMS. Since about 1730 Wednesday night he has been mroe confused, has seemed to be unbalance,  no recent falls. Has had  v/d since last night. 98.4 temp, hallucinations. Wife en route to ED. 130/72, hr 90, hx of afib, CABG, HTN. CBG 147.

## 2019-08-06 NOTE — ED Provider Notes (Signed)
[facility] Emergency Department Provider Note   ____________________________________________   First MD Initiated Contact with Patient 08/06/19 0410     (approximate)  I have reviewed the triage vital signs and the nursing notes.   HISTORY  Chief Complaint Altered Mental Status    HPI Marquez Danny is a 73 y.o. male brought to the ED via EMS from home status post fall and confusion.  Wife states patient has had approximately 24 hours of first vomiting, then followed by diarrhea.  She heard him stumble and fall tonight.  Found patient on the floor disoriented.  Patient reports he slipped on his bedroom slipper and fell.  Denies fever, cough, chest pain, shortness of breath, abdominal pain, dysuria.  Denies anticoagulant use.       Past Medical History:  Diagnosis Date  . A-fib ([place])   . CHF (congestive heart failure) ([facility])    EF 30%  . Chronic kidney disease    congenital one kidney  . Hypertension   . Myocardial infarction ([place])   . Solitary kidney     Patient Active Problem List   Diagnosis Date Noted  . Chronic back pain 03/23/2019  . Coronary arteriosclerosis 03/23/2019  . Gastroesophageal reflux disease 03/23/2019  . 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 History of heart disorder 03/23/2019  . History of myocardial infarction 03/23/2019  . History of pancreatitis 03/23/2019  . 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  . Status post lumbar spine operation 03/23/2019  . Numbness and tingling of foot 01/15/2018  . Weakness of foot 01/15/2018  . Burning sensation of feet 01/08/2018  . Anemia 03/01/2017  . Atrial fibrillation with RVR ([place]) 12/08/2016  . Atrial fibrillation ([place]) 12/08/2016  . Chronic systolic congestive heart failure ([place]) 08/09/2014  . Hyperlipidemia 08/09/2014  . Hypertension 08/09/2014    Past Surgical History:  Procedure  Laterality Date  . BACK SURGERY    . CARDIOVERSION N/A 01/23/2017   Procedure: CARDIOVERSION;  Surgeon: Teodoro Spray, MD;  Location: [facility];  Service: Cardiovascular;  Laterality: N/A;  . CARDIOVERSION N/A 02/01/2017   Procedure: CARDIOVERSION;  Surgeon: Teodoro Spray, MD;  Location: [facility];  Service: Cardiovascular;  Laterality: N/A;  . COLONOSCOPY WITH PROPOFOL N/A 03/05/2019   Procedure: COLONOSCOPY WITH PROPOFOL;  Surgeon: Lollie Sails, MD;  Location: [facility]COPY;  Service: Endoscopy;  Laterality: N/A;  . CORONARY ARTERY BYPASS GRAFT    . ESOPHAGOGASTRODUODENOSCOPY (EGD) WITH PROPOFOL N/A 03/05/2019   Procedure: ESOPHAGOGASTRODUODENOSCOPY (EGD) WITH PROPOFOL;  Surgeon: Lollie Sails, MD;  Location: [facility] ENDOSCOPY;  Service: Endoscopy;  Laterality: N/A;  . JOINT REPLACEMENT     both knees 13 years ago, partial shoulder right    Prior to Admission medications   Medication Sig Start Date End Date Taking? Authorizing Provider  acetaminophen (TYLENOL) 500 MG tablet Take 1,000-1,500 mg by mouth 2 (two) times daily as needed for moderate pain or headache.    [provider]  allopurinol (ZYLOPRIM) 100 MG tablet Take 200 mg by mouth daily. 02/18/19   [provider]  amiodarone (PACERONE) 200 MG tablet Take 400 mg by mouth 2 (two) times daily.    [provider]  amoxicillin-clavulanate (AUGMENTIN) 875-125 MG tablet Take 1 tablet by mouth 2 (two) times daily with a meal. 11/25/18   [provider]  brimonidine (ALPHAGAN) 0.2 % ophthalmic solution brimonidine 0.2 % eye drops    [provider]  chlordiazePOXIDE (LIBRIUM) 25 MG capsule Take 4 tablets a day on day 1, and decreased by 1 tablet daily until gone. Patient not taking: Reported on 03/05/2019 04/12/17   [name], [name], MD  Cholecalciferol (VITAMIN D-1000 MAX ST) 25 MCG (1000 UT) tablet Take by mouth.    [provider]  colchicine 0.6 MG tablet Take 0.6 mg by mouth  2 (two) times daily. 01/23/19   [provider]  ferrous sulfate 325 (65 FE) MG EC tablet Take 1 tablet (325 mg total) by mouth 2 (two) times daily. 03/02/17 03/02/18  [name], [name], MD  furosemide (LASIX) 40 MG tablet Take 40mg s by mouth once daily, may take an additional 40mg s once daily as needed for swelling or weight gain 06/22/16   [provider]  gabapentin (NEURONTIN) 400 MG capsule Take 1 capsule (400 mg total) by mouth 3 (three) times daily. Patient not taking: Reported on 03/24/2019 06/19/17   Lenn Sinkegal, Norman S, DPM  gabapentin (NEURONTIN) 400 MG capsule Take 1 capsule (400 mg total) by mouth 3 (three) times daily. Patient not taking: Reported on 03/24/2019 12/20/17   Lenn Sinkegal, Norman S, DPM  HYDROcodone-acetaminophen (NORCO/VICODIN) 5-325 MG tablet hydrocodone 5 mg-acetaminophen 325 mg tablet  TAKE 1 TABLET BY MOUTH EVERY 6 HOURS AS NEEDED FOR PAIN    [provider]  ketorolac (ACULAR) 0.5 % ophthalmic solution ketorolac 0.5 % eye drops    [provider]  Melatonin 10 MG TABS Take 10 mg by mouth at bedtime.    [provider]  metoprolol (LOPRESSOR) 50 MG tablet Take 1 tablet (50 mg total) by mouth 2 (two) times daily. Patient not taking: Reported on 03/24/2019 12/09/16   [name], Sital, MD  Naphazoline HCl (CLEAR EYES OP) Apply 1 drop to eye daily as needed (dry eyes).    [provider]  ofloxacin (OCUFLOX) 0.3 % ophthalmic solution ofloxacin 0.3 % eye drops    [provider]  omeprazole (PRILOSEC) 20 MG capsule Take 20 mg by mouth daily.    [provider]  ondansetron (ZOFRAN ODT) 4 MG disintegrating tablet Take 1 tablet (4 mg total) by mouth every 8 (eight) hours as needed for nausea or vomiting. Patient not taking: Reported on 03/24/2019 04/12/17   [name], [name], MD  pantoprazole (PROTONIX) 40 MG tablet TAKE 1 TABLET (40 MG TOTAL) BY MOUTH 2 (TWO) TIMES DAILY BEFORE MEALS 02/13/19   [provider]   potassium chloride SA (KLOR-CON M20) 20 MEQ tablet TAKE 1 TABLET (20 MEQ TOTAL) BY MOUTH ONCE DAILY. AND EXTRA TABLET IF TAKES 2ND TABLET OF LASIX 05/01/16   [provider]  prednisoLONE acetate (PRED FORTE) 1 % ophthalmic suspension prednisolone acetate 1 % eye drops,suspension    [provider]  predniSONE (DELTASONE) 10 MG tablet prednisone 10 mg tablet  USE AS DIRECTED ON INCLUDED SHEET OF PAPER    [provider]  predniSONE (STERAPRED UNI-PAK 21 TAB) 5 MG (21) TBPK tablet TAKE 6 TABLETS ON DAY 1 AS DIRECTED ON PACKAGE AND DECREASE BY 1 TAB EACH DAY FOR A TOTAL OF 6 DAYS 11/21/18   [provider]  pregabalin (LYRICA) 50 MG capsule Take 50 mg in the morning, 50 mg in the afternoon, and 100 mg at night 04/25/18   [provider]  ramipril (ALTACE) 5 MG capsule Take 5 mg by mouth daily. 01/26/17   [provider]  rosuvastatin (CRESTOR) 5 MG tablet  02/14/19   [provider]  sertraline (  ZOLOFT) 100 MG tablet Take 100 mg by mouth daily.  08/23/16   [provider]  Tapentadol HCl (NUCYNTA) 100 MG TABS Nucynta 100 mg tablet  Take 1 tablet every 4 hours by oral route.    [provider]  zolpidem (AMBIEN) 10 MG tablet Take 10 mg by mouth at bedtime.  08/29/16   [provider]    Allergies Codeine and Rosuvastatin  Family History  Problem Relation Age of Onset  . Heart attack Father     Social History Social History   Tobacco Use  . Smoking status: Former Smoker    Years: 15.00    Types: Cigarettes  . Smokeless tobacco: Never Used  Substance Use Topics  . Alcohol use: Not Currently    Alcohol/week: 24.0 standard drinks    Types: 24 Glasses of wine per week  . Drug use: Not Currently    Review of Systems  Constitutional: No fever/chills Eyes: No visual changes. ENT: No sore throat. Cardiovascular: Denies chest pain. Respiratory: Denies shortness of breath. Gastrointestinal: No abdominal  pain.  Positive for nausea, vomiting and diarrhea.  No constipation. Genitourinary: Negative for dysuria. Musculoskeletal: Negative for back pain. Skin: Negative for rash. Neurological: Positive for confusion.  Negative for headaches, focal weakness or numbness.   ____________________________________________   PHYSICAL EXAM:  VITAL SIGNS: ED Triage Vitals  Enc Vitals Group     BP 08/06/19 0208 118/76     Pulse Rate 08/06/19 0208 99     Resp 08/06/19 0208 18     Temp 08/06/19 0208 99.6 F (37.6 C)     Temp Source 08/06/19 0208 Oral     SpO2 08/06/19 0208 96 %     Weight --      Height --      Head Circumference --      Peak Flow --      Pain Score 08/06/19 0207 0     Pain Loc --      Pain Edu? --      Excl. in GC? --     Constitutional: Alert and oriented. Well appearing and in mild acute distress. Eyes: Conjunctivae are normal. PERRL. EOMI. Head: Atraumatic. Nose: Atraumatic. Mouth/Throat: Mucous membranes are moist.  No dental malocclusion. Neck: No stridor.  No cervical spine tenderness to palpation. Cardiovascular: Normal rate, regular rhythm. Grossly normal heart sounds.  Good peripheral circulation. Respiratory: Normal respiratory effort.  No retractions. Lungs CTAB. Gastrointestinal: Soft and nontender to light or deep palpation. No distention. No abdominal bruits. No CVA tenderness. Musculoskeletal: No lower extremity tenderness nor edema.  No joint effusions. Neurologic: Alert and oriented to person and place.  Intermittent confusion.  Normal speech and language. No gross focal neurologic deficits are appreciated. MAEx4.  Skin:  Skin is warm, dry and intact. No rash noted.  No petechiae. Psychiatric: Mood and affect are normal. Speech and behavior are normal.  ____________________________________________   LABS (all labs ordered are listed, but only abnormal results are displayed)  Labs Reviewed  COMPREHENSIVE METABOLIC PANEL - Abnormal; Notable for the  following components:      Result Value   Sodium 130 (*)    Chloride 96 (*)    Glucose, Bld 127 (*)    AST 42 (*)    All other components within normal limits  URINALYSIS, COMPLETE (UACMP) WITH MICROSCOPIC - Abnormal; Notable for the following components:   Color, Urine YELLOW (*)    APPearance CLEAR (*)    Protein, ur 100 (*)  All other components within normal limits  LIPASE, BLOOD - Abnormal; Notable for the following components:   Lipase 356 (*)    All other components within normal limits  TROPONIN I (HIGH SENSITIVITY) - Abnormal; Notable for the following components:   Troponin I (High Sensitivity) 52 (*)    All other components within normal limits  TROPONIN I (HIGH SENSITIVITY) - Abnormal; Notable for the following components:   Troponin I (High Sensitivity) 49 (*)    All other components within normal limits  CULTURE, BLOOD (ROUTINE X 2)  CULTURE, BLOOD (ROUTINE X 2)  URINE CULTURE  CBC  LACTIC ACID, PLASMA  AMMONIA  LACTIC ACID, PLASMA  TROPONIN I (HIGH SENSITIVITY)   ____________________________________________  EKG  ED ECG REPORT I, Yurem Viner J, the attending physician, personally viewed and interpreted this ECG.   Date: 08/06/2019  EKG Time: 0211  Rate: 95  Rhythm: atrial fibrillation, rate 95  Axis: None  Intervals:none  ST&T Change: Nonspecific  ____________________________________________  RADIOLOGY  ED MD interpretation: No ICH, mild pulmonary vascular congestion  Official radiology report(s): Dg Chest 1 View  Result Date: 08/06/2019 CLINICAL DATA:  Nausea and vomiting EXAM: CHEST  1 VIEW COMPARISON:  03/01/2017 FINDINGS: Cardiac shadow is enlarged. Postsurgical changes are again seen. Mild vascular congestion is again seen but slightly increased. No focal infiltrate or sizable effusion is seen. No bony abnormality is noted. IMPRESSION: Mild vascular congestion slightly increased from the prior exam. Electronically Signed   By: Alcide Clever  M.D.   On: 08/06/2019 03:09   Ct Head Wo Contrast  Result Date: 08/06/2019 CLINICAL DATA:  Encephalopathy EXAM: CT HEAD WITHOUT CONTRAST TECHNIQUE: Contiguous axial images were obtained from the base of the skull through the vertex without intravenous contrast. COMPARISON:  None. FINDINGS: Brain: There is no mass, hemorrhage or extra-axial collection. There is generalized atrophy without lobar predilection. Hypodensity of the white matter is most commonly associated with chronic microvascular disease. Vascular: Atherosclerotic calcification of the vertebral and internal carotid arteries at the skull base. No abnormal hyperdensity of the major intracranial arteries or dural venous sinuses. Skull: The visualized skull base, calvarium and extracranial soft tissues are normal. Sinuses/Orbits: No fluid levels or advanced mucosal thickening of the visualized paranasal sinuses. No mastoid or middle ear effusion. The orbits are normal. IMPRESSION: Generalized atrophy and chronic microvascular ischemia without acute intracranial abnormality. Electronically Signed   By: Deatra Robinson M.D.   On: 08/06/2019 03:03    ____________________________________________   PROCEDURES  Procedure(s) performed (including Critical Care):  Procedures   ____________________________________________   INITIAL IMPRESSION / ASSESSMENT AND PLAN / ED COURSE  As part of my medical decision making, I reviewed the following data within the electronic MEDICAL RECORD NUMBER History obtained from family, Nursing notes reviewed and incorporated, Labs reviewed, EKG interpreted, Old chart reviewed, Radiograph reviewed and Notes from prior ED visits     Danny Marquez was evaluated in Emergency Department on 08/06/2019 for the symptoms described in the history of present illness. He was evaluated in the context of the global COVID-19 pandemic, which necessitated consideration that the patient might be at risk for infection with the  SARS-CoV-2 virus that causes COVID-19. Institutional protocols and algorithms that pertain to the evaluation of patients at risk for COVID-19 are in a state of rapid change based on information released by regulatory bodies including the CDC and federal and state organizations. These policies and algorithms were followed during the patient's care in the ED.  73 year old male who presents for confusion in the setting of vomiting/diarrhea, fall. Differential diagnosis includes, but is not limited to, alcohol, illicit or prescription medications, or other toxic ingestion; intracranial pathology such as stroke or intracerebral hemorrhage; fever or infectious causes including sepsis; hypoxemia and/or hypercarbia; uremia; trauma; endocrine related disorders such as diabetes, hypoglycemia, and thyroid-related diseases; hypertensive encephalopathy; etc.  Initial troponin 52.  Stable hyponatremia.  Wife tells me patient has not drunk alcohol x2 years.  Has been diagnosed with "mild cirrhosis".  Will check blood cultures, lactic acid, ammonia, urine, repeat troponin.  Orthostatic vital signs, IV fluids and rectal temperature.   Clinical Course as of Aug 06 623  Thu Aug 06, 2019  2956 Patient was orthostatic.  Will administer IV fluids.   [JS]  [id number] Patient feeling fine and eager for discharge home.  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  I did offer patient hospitalization but both patient and his wife prefer to be discharged home.  Will discharge home with Zofran prescription to use as needed.  Strict return precautions given.  Both verbalized understanding agree with plan of care.   [JS]    Clinical Course User Index [JS] Irean Hong, MD     ____________________________________________   FINAL CLINICAL  IMPRESSION(S) / ED DIAGNOSES  Final diagnoses:  Fall, initial encounter  Confusion  Vomiting and diarrhea  Orthostasis  Acute pancreatitis, unspecified complication status, unspecified pancreatitis type     ED Discharge Orders    None       Note:  This document was prepared using Dragon voice recognition software and may include unintentional dictation errors.   Irean Hong, MD 08/06/19 [phone]

## 2019-08-06 NOTE — ED Notes (Signed)
MD at the bedside to discuss plan of care.  

## 2019-08-06 NOTE — Discharge Instructions (Addendum)
1.  You may take Zofran as needed for nausea/vomiting. 2.  Clear liquid diet x12 hours, then bland diet x3 days, then slowly advance diet as tolerated. 3.  Return to the ER for worsening symptoms, persistent vomiting, difficulty breathing or other concerns.

## 2019-08-06 NOTE — ED Notes (Signed)
Dr. Sung at the bedside °

## 2019-08-07 LAB — URINE CULTURE: Culture: NO GROWTH

## 2019-08-11 LAB — CULTURE, BLOOD (ROUTINE X 2)
Culture: NO GROWTH
Culture: NO GROWTH
Special Requests: ADEQUATE
Special Requests: ADEQUATE

## 2021-05-20 ENCOUNTER — Emergency Department: Payer: No Typology Code available for payment source

## 2021-05-20 ENCOUNTER — Other Ambulatory Visit: Payer: Self-pay

## 2021-05-20 ENCOUNTER — Emergency Department
Admission: EM | Admit: 2021-05-20 | Discharge: 2021-05-20 | Disposition: A | Discharge status: Home / Self Care | Payer: No Typology Code available for payment source | Attending: Emergency Medicine | Admitting: Emergency Medicine

## 2021-05-20 DIAGNOSIS — W1839XA Other fall on same level, initial encounter: Secondary | ICD-10-CM | POA: Insufficient documentation

## 2021-05-20 DIAGNOSIS — T50905A Adverse effect of unspecified drugs, medicaments and biological substances, initial encounter: Secondary | ICD-10-CM | POA: Insufficient documentation

## 2021-05-20 DIAGNOSIS — Z87891 Personal history of nicotine dependence: Secondary | ICD-10-CM | POA: Diagnosis not present

## 2021-05-20 DIAGNOSIS — I2510 Atherosclerotic heart disease of native coronary artery without angina pectoris: Secondary | ICD-10-CM | POA: Diagnosis not present

## 2021-05-20 DIAGNOSIS — Y900 Blood alcohol level of less than 20 mg/100 ml: Secondary | ICD-10-CM | POA: Insufficient documentation

## 2021-05-20 DIAGNOSIS — I4891 Unspecified atrial fibrillation: Secondary | ICD-10-CM | POA: Diagnosis not present

## 2021-05-20 DIAGNOSIS — I5022 Chronic systolic (congestive) heart failure: Secondary | ICD-10-CM | POA: Diagnosis not present

## 2021-05-20 DIAGNOSIS — Z7901 Long term (current) use of anticoagulants: Secondary | ICD-10-CM | POA: Diagnosis not present

## 2021-05-20 DIAGNOSIS — S0990XA Unspecified injury of head, initial encounter: Secondary | ICD-10-CM

## 2021-05-20 DIAGNOSIS — R42 Dizziness and giddiness: Secondary | ICD-10-CM | POA: Diagnosis not present

## 2021-05-20 DIAGNOSIS — Z951 Presence of aortocoronary bypass graft: Secondary | ICD-10-CM | POA: Insufficient documentation

## 2021-05-20 DIAGNOSIS — W19XXXA Unspecified fall, initial encounter: Secondary | ICD-10-CM

## 2021-05-20 DIAGNOSIS — Z96653 Presence of artificial knee joint, bilateral: Secondary | ICD-10-CM | POA: Insufficient documentation

## 2021-05-20 DIAGNOSIS — T887XXA Unspecified adverse effect of drug or medicament, initial encounter: Secondary | ICD-10-CM

## 2021-05-20 DIAGNOSIS — I110 Hypertensive heart disease with heart failure: Secondary | ICD-10-CM | POA: Diagnosis not present

## 2021-05-20 DIAGNOSIS — S01111A Laceration without foreign body of right eyelid and periocular area, initial encounter: Secondary | ICD-10-CM | POA: Insufficient documentation

## 2021-05-20 DIAGNOSIS — Z79899 Other long term (current) drug therapy: Secondary | ICD-10-CM | POA: Diagnosis not present

## 2021-05-20 DIAGNOSIS — S0181XA Laceration without foreign body of other part of head, initial encounter: Secondary | ICD-10-CM

## 2021-05-20 LAB — CBC WITH DIFFERENTIAL/PLATELET
Abs Immature Granulocytes: 0.02 10*3/uL (ref 0.00–0.07)
Basophils Absolute: 0 10*3/uL (ref 0.0–0.1)
Basophils Relative: 1 %
Eosinophils Absolute: 0.4 10*3/uL (ref 0.0–0.5)
Eosinophils Relative: 7 %
HCT: 37.8 % — ABNORMAL LOW (ref 39.0–52.0)
Hemoglobin: 12.2 g/dL — ABNORMAL LOW (ref 13.0–17.0)
Immature Granulocytes: 0 %
Lymphocytes Relative: 18 %
Lymphs Abs: 1 10*3/uL (ref 0.7–4.0)
MCH: 29.5 pg (ref 26.0–34.0)
MCHC: 32.3 g/dL (ref 30.0–36.0)
MCV: 91.3 fL (ref 80.0–100.0)
Monocytes Absolute: 0.4 10*3/uL (ref 0.1–1.0)
Monocytes Relative: 9 %
Neutro Abs: 3.4 10*3/uL (ref 1.7–7.7)
Neutrophils Relative %: 65 %
Platelets: 153 10*3/uL (ref 150–400)
RBC: 4.14 MIL/uL — ABNORMAL LOW (ref 4.22–5.81)
RDW: 14.7 % (ref 11.5–15.5)
WBC: 5.2 10*3/uL (ref 4.0–10.5)
nRBC: 0 % (ref 0.0–0.2)

## 2021-05-20 LAB — URINALYSIS, COMPLETE (UACMP) WITH MICROSCOPIC
Bacteria, UA: NONE SEEN
Bilirubin Urine: NEGATIVE
Glucose, UA: NEGATIVE mg/dL
Hgb urine dipstick: NEGATIVE
Ketones, ur: NEGATIVE mg/dL
Leukocytes,Ua: NEGATIVE
Nitrite: NEGATIVE
Protein, ur: NEGATIVE mg/dL
Specific Gravity, Urine: <1.005 — ABNORMAL LOW (ref 1.005–1.030)
pH: 5.5 (ref 5.0–8.0)

## 2021-05-20 LAB — URINE DRUG SCREEN, QUALITATIVE (ARMC ONLY)
Amphetamines, Ur Screen: NOT DETECTED
Barbiturates, Ur Screen: NOT DETECTED
Benzodiazepine, Ur Scrn: NOT DETECTED
Cannabinoid 50 Ng, Ur ~~LOC~~: NOT DETECTED
Cocaine Metabolite,Ur ~~LOC~~: NOT DETECTED
MDMA (Ecstasy)Ur Screen: NOT DETECTED
Methadone Scn, Ur: NOT DETECTED
Opiate, Ur Screen: NOT DETECTED
Phencyclidine (PCP) Ur S: NOT DETECTED
Tricyclic, Ur Screen: NOT DETECTED

## 2021-05-20 LAB — COMPREHENSIVE METABOLIC PANEL
ALT: 33 U/L (ref 0–44)
AST: 96 U/L — ABNORMAL HIGH (ref 15–41)
Albumin: 3.2 g/dL — ABNORMAL LOW (ref 3.5–5.0)
Alkaline Phosphatase: 75 U/L (ref 38–126)
Anion gap: 6 (ref 5–15)
BUN: 21 mg/dL (ref 8–23)
CO2: 27 mmol/L (ref 22–32)
Calcium: 9.3 mg/dL (ref 8.9–10.3)
Chloride: 102 mmol/L (ref 98–111)
Creatinine, Ser: 1.41 mg/dL — ABNORMAL HIGH (ref 0.61–1.24)
GFR, Estimated: 52 mL/min — ABNORMAL LOW (ref >60)
Glucose, Bld: 130 mg/dL — ABNORMAL HIGH (ref 70–99)
Potassium: 4.3 mmol/L (ref 3.5–5.1)
Sodium: 135 mmol/L (ref 135–145)
Total Bilirubin: 0.6 mg/dL (ref 0.3–1.2)
Total Protein: 5.8 g/dL — ABNORMAL LOW (ref 6.5–8.1)

## 2021-05-20 LAB — TROPONIN I (HIGH SENSITIVITY)
Troponin I (High Sensitivity): 16 ng/L (ref <18)
Troponin I (High Sensitivity): 15 ng/L (ref <18)

## 2021-05-20 LAB — ETHANOL: Alcohol, Ethyl (B): <10 mg/dL (ref <10)

## 2021-05-20 LAB — MAGNESIUM: Magnesium: 2.1 mg/dL (ref 1.7–2.4)

## 2021-05-20 MED ORDER — SODIUM CHLORIDE 0.9 % IV BOLUS (SEPSIS): 250.0000 mL | Freq: Once | INTRAVENOUS | Status: DC

## 2021-05-20 MED ORDER — ACETAMINOPHEN 325 MG PO TABS
650.0000 mg | ORAL_TABLET | Freq: Once | ORAL | Status: AC
  Administered 2021-05-20: 650 mg via ORAL
  Filled 2021-05-20: qty 2

## 2021-05-20 MED ORDER — BACITRACIN ZINC 500 UNIT/GM EX OINT
TOPICAL_OINTMENT | Freq: Once | CUTANEOUS | Status: AC
  Administered 2021-05-20: 1 via TOPICAL
  Filled 2021-05-20: qty 0.9

## 2021-05-20 MED ORDER — LIDOCAINE HCL (PF) 1 % IJ SOLN
5.0000 mL | Freq: Once | INTRAMUSCULAR | Status: AC
  Administered 2021-05-20: 5 mL via INTRADERMAL
  Filled 2021-05-20: qty 5

## 2021-05-20 NOTE — Discharge Instructions (Addendum)
I recommend that you use a walker at all times.  Please follow-up closely with your primary care doctor.  Your lab work, urine were reassuring today.  CT of your head and cervical spine showed no acute abnormality.

## 2021-05-20 NOTE — ED Provider Notes (Signed)
[facility] Emergency Department Provider Note ____________________________________________   Event Date/Time   First MD Initiated Contact with Patient 05/20/21 [phone]     (approximate)  I have reviewed the triage vital signs and the nursing notes.   HISTORY  Chief Complaint Dizziness    HPI Danny Marquez is a 75 y.o. male with history of CHF, CKD, hypertension, atrial fibrillation on Eliquis who presents to the emergency department with his wife for concerns for 2 falls tonight.  She states that he seems to be more unsteady on his feet today which is not something that is completely abnormal for him.  She states that he does not have a cane or walker at home but they were planning to go get a walker.  She states that she last saw him around 9 PM before she went to bed.  She states around 3 AM she heard him get up to go to the bathroom and then he went into her bedroom and woke her up stating that he had fallen twice.  He does have a laceration above the right eye.  She states his tetanus vaccination is up-to-date in the past year.  She was concerned that he needed sutures and concerned that he hit his head while on Eliquis.  She also feels like he has been more confused than normal.  No new changes in medications.  Did receive a flu vaccine yesterday.  No fevers, cough, vomiting or diarrhea.  No drug or alcohol use.  She states he did take an Ambien before going to bed and sometimes he will be confused and unsteady on his feet after taking this medication.         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   History of heart disorder 03/23/2019   History of myocardial infarction 03/23/2019   History of pancreatitis 03/23/2019   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   Status post lumbar spine operation 03/23/2019   Numbness and tingling of foot 01/15/2018   Weakness of foot 01/15/2018   Burning sensation of feet 01/08/2018   Anemia 03/01/2017   Atrial fibrillation with RVR (HCC) 12/08/2016   Atrial fibrillation (HCC) 12/08/2016   Chronic systolic congestive heart failure (HCC) 08/09/2014   Hyperlipidemia 08/09/2014   Hypertension 08/09/2014    Past Surgical History:  Procedure Laterality Date   BACK SURGERY     CARDIOVERSION N/A 01/23/2017   Procedure: CARDIOVERSION;  Surgeon: Dalia Heading, MD;  Location: [facility] ORS;  Service: Cardiovascular;  Laterality: N/A;   CARDIOVERSION N/A 02/01/2017   Procedure: CARDIOVERSION;  Surgeon: Dalia Heading, MD;  Location: [facility] ORS;  Service: Cardiovascular;  Laterality: N/A;   COLONOSCOPY WITH PROPOFOL N/A 03/05/2019   Procedure: COLONOSCOPY WITH PROPOFOL;  Surgeon: Christena Deem, MD;  Location: [facility] ENDOSCOPY;  Service: Endoscopy;  Laterality: N/A;   CORONARY ARTERY BYPASS GRAFT     ESOPHAGOGASTRODUODENOSCOPY (EGD) WITH PROPOFOL N/A 03/05/2019   Procedure: ESOPHAGOGASTRODUODENOSCOPY (EGD) WITH PROPOFOL;  Surgeon: Christena Deem, MD;  Location: [facility] ENDOSCOPY;  Service: Endoscopy;  Laterality: N/A;   JOINT REPLACEMENT     both knees 13 years ago, partial shoulder right    Prior to Admission medications   Medication Sig Start Date End Date Taking? Authorizing Provider  acetaminophen (TYLENOL) 500 MG tablet Take 1,000-1,500 mg by mouth 2 (two) times daily as needed for moderate pain or headache.    [provider]  allopurinol (ZYLOPRIM) 100 MG tablet Take 200 mg by mouth daily. 02/18/19   [provider]   amiodarone (PACERONE) 200 MG tablet Take 400 mg by mouth 2 (two) times daily.    [provider]  amoxicillin-clavulanate (AUGMENTIN) 875-125 MG tablet Take 1 tablet by mouth 2 (two) times daily with a meal. 11/25/18   [provider]  brimonidine (ALPHAGAN) 0.2 % ophthalmic solution brimonidine 0.2 % eye drops    [provider]  chlordiazePOXIDE (LIBRIUM) 25 MG capsule Take 4 tablets a day on day 1, and decreased by 1 tablet daily until gone. Patient not taking: Reported on 03/05/2019 04/12/17   Emily Filbert, MD  Cholecalciferol (VITAMIN D-1000 MAX ST) 25 MCG (1000 UT) tablet Take by mouth.    [provider]  colchicine 0.6 MG tablet Take 0.6 mg by mouth 2 (two) times daily. 01/23/19   [provider]  ferrous sulfate 325 (65 FE) MG EC tablet Take 1 tablet (325 mg total) by mouth 2 (two) times daily. 03/02/17 03/02/18  Altamese Dilling, MD  furosemide (LASIX) 40 MG tablet Take 40mg s by mouth once daily, may take an additional 40mg s once daily as needed for swelling or weight gain 06/22/16   [provider]  gabapentin (NEURONTIN) 400 MG capsule Take 1 capsule (400 mg total) by mouth 3 (three) times daily. Patient not taking: Reported on 03/24/2019 06/19/17   03/26/2019, DPM  gabapentin (NEURONTIN) 400 MG capsule Take 1 capsule (400 mg total) by mouth 3 (three) times daily. Patient not taking: Reported on 03/24/2019 12/20/17   03/26/2019, DPM  HYDROcodone-acetaminophen (NORCO/VICODIN) 5-325 MG tablet hydrocodone 5 mg-acetaminophen 325 mg tablet  TAKE 1 TABLET BY MOUTH EVERY 6 HOURS AS NEEDED FOR PAIN    [provider]  ketorolac (ACULAR) 0.5 % ophthalmic solution ketorolac 0.5 % eye drops    [provider]  Melatonin 10 MG TABS Take 10 mg by mouth at bedtime.    [provider]  metoprolol (LOPRESSOR) 50 MG tablet Take 1 tablet (50 mg total) by mouth 2 (two) times daily. Patient not taking: Reported  on 03/24/2019 12/09/16   03/26/2019, MD  Naphazoline HCl (CLEAR EYES OP) Apply 1 drop to eye daily as needed (dry eyes).    [provider]  ofloxacin (OCUFLOX) 0.3 % ophthalmic solution ofloxacin 0.3 % eye drops    [provider]  omeprazole (PRILOSEC) 20 MG capsule Take 20 mg by mouth daily.    [provider]  ondansetron (ZOFRAN ODT) 4 MG disintegrating tablet Take 1 tablet (4 mg total) by mouth every 8 (eight) hours as needed for nausea or vomiting. 08/06/19   Adrian Saran, MD  pantoprazole (PROTONIX) 40 MG tablet TAKE 1 TABLET (40 MG TOTAL) BY MOUTH 2 (TWO) TIMES DAILY BEFORE MEALS 02/13/19   [provider]  potassium chloride SA (KLOR-CON M20) 20 MEQ tablet TAKE 1 TABLET (20 MEQ TOTAL) BY MOUTH ONCE DAILY. AND EXTRA TABLET IF TAKES 2ND TABLET OF LASIX 05/01/16  [provider]  prednisoLONE acetate (PRED FORTE) 1 % ophthalmic suspension prednisolone acetate 1 % eye drops,suspension    [provider]  predniSONE (DELTASONE) 10 MG tablet prednisone 10 mg tablet  USE AS DIRECTED ON INCLUDED SHEET OF PAPER    [provider]  predniSONE (STERAPRED UNI-PAK 21 TAB) 5 MG (21) TBPK tablet TAKE 6 TABLETS ON DAY 1 AS DIRECTED ON PACKAGE AND DECREASE BY 1 TAB EACH DAY FOR A TOTAL OF 6 DAYS 11/21/18   [provider]  pregabalin (LYRICA) 50 MG capsule Take 50 mg in the morning, 50 mg in the afternoon, and 100 mg at night 04/25/18   [provider]  ramipril (ALTACE) 5 MG capsule Take 5 mg by mouth daily. 01/26/17   [provider]  rosuvastatin (CRESTOR) 5 MG tablet  02/14/19   [provider]  sertraline (ZOLOFT) 100 MG tablet Take 100 mg by mouth daily.  08/23/16   [provider]  Tapentadol HCl (NUCYNTA) 100 MG TABS Nucynta 100 mg tablet  Take 1 tablet every 4 hours by oral route.    [provider]  zolpidem (AMBIEN) 10 MG tablet Take 10 mg by mouth at bedtime.  08/29/16   [provider]    Allergies Codeine and Rosuvastatin  Family History  Problem Relation Age of Onset   Heart attack Father     Social History Social History   Tobacco Use   Smoking status: Former    Years: 15.00    Types: Cigarettes   Smokeless tobacco: Never  Vaping Use   Vaping Use: Never used  Substance Use Topics   Alcohol use: Not Currently    Alcohol/week: 24.0 standard drinks    Types: 24 Glasses of wine per week   Drug use: Not Currently    Review of Systems Constitutional: No fever. Eyes: No visual changes. ENT: No sore throat. Cardiovascular: Denies chest pain. Respiratory: Denies shortness of breath. Gastrointestinal: No nausea, vomiting, diarrhea. Genitourinary: Negative for dysuria. Musculoskeletal: Negative for back pain. Skin: Negative for rash. Neurological: Negative for focal weakness or numbness.   ____________________________________________   PHYSICAL EXAM:  VITAL SIGNS: ED Triage Vitals  Enc Vitals Group     BP 05/20/21 0347 132/67     Pulse Rate 05/20/21 0347 (!) 58     Resp 05/20/21 0347 16     Temp 05/20/21 0347 98.6 F (37 C)     Temp Source 05/20/21 0347 Oral     SpO2 05/20/21 0347 98 %     Weight 05/20/21 0348 173 lb (78.5 kg)     Height 05/20/21 0348 5\' 8"  (1.727 m)     Head Circumference --      Peak Flow --      Pain Score --      Pain Loc --      Pain Edu? --      Excl. in GC? --    CONSTITUTIONAL: Alert and oriented x 4 and responds appropriately to questions. Well-appearing; well-nourished; GCS 15, elderly HEAD: Normocephalic; 3 cm laceration above the right eye EYES: Conjunctivae clear, PERRL, EOMI ENT: normal nose; no rhinorrhea; moist mucous membranes; pharynx without lesions noted; no dental injury; no septal hematoma NECK: Supple, no meningismus, no LAD; no midline spinal tenderness, step-off or deformity; trachea midline CARD: RRR; S1 and S2 appreciated; no murmurs, no clicks, no rubs, no gallops RESP: Normal  chest excursion without splinting or tachypnea; breath sounds clear and equal bilaterally; no wheezes,  no rhonchi, no rales; no hypoxia or respiratory distress CHEST:  chest wall stable, no crepitus or ecchymosis or deformity, nontender to palpation; no flail chest ABD/GI: Normal bowel sounds; non-distended; soft, non-tender, no rebound, no guarding; no ecchymosis or other lesions noted PELVIS:  stable, nontender to palpation BACK:  The back appears normal and is non-tender to palpation, there is no CVA tenderness; no midline spinal tenderness, step-off or deformity EXT: Normal ROM in all joints; non-tender to palpation; no edema; normal capillary refill; no cyanosis, no bony tenderness or bony deformity of patient's extremities, no joint effusion, compartments are soft, extremities are warm and well-perfused, no ecchymosis SKIN: Normal color for age and race; warm, small skin tear to the left forearm NEURO: Moves all extremities equally, no drift, sensation to light touch intact diffusely, cranial nerves II through XII intact, mild dysarthria, no aphasia PSYCH: The patient's mood and manner are appropriate. Grooming and personal hygiene are appropriate.  ____________________________________________   LABS (all labs ordered are listed, but only abnormal results are displayed)  Labs Reviewed  URINALYSIS, COMPLETE (UACMP) WITH MICROSCOPIC - Abnormal; Notable for the following components:      Result Value   Specific Gravity, Urine <1.005 (*)    All other components within normal limits  COMPREHENSIVE METABOLIC PANEL - Abnormal; Notable for the following components:   Glucose, Bld 130 (*)    Creatinine, Ser 1.41 (*)    Total Protein 5.8 (*)    Albumin 3.2 (*)    AST 96 (*)    GFR, Estimated 52 (*)    All other components within normal limits  CBC WITH DIFFERENTIAL/PLATELET - Abnormal; Notable for the following components:   RBC 4.14 (*)    Hemoglobin 12.2 (*)    HCT 37.8 (*)    All other  components within normal limits  MAGNESIUM  URINE DRUG SCREEN, QUALITATIVE ([facility] ONLY)  ETHANOL  TROPONIN I (HIGH SENSITIVITY)  TROPONIN I (HIGH SENSITIVITY)   ____________________________________________  EKG   EKG Interpretation  Date/Time:  Saturday May 20 2021 03:56:37 EDT Ventricular Rate:  61 PR Interval:  280 QRS Duration: 122 QT Interval:  429 QTC Calculation: 433 R Axis:   -2 Text Interpretation: Sinus or ectopic atrial rhythm Ventricular bigeminy Prolonged PR interval Left bundle branch block Baseline wander in lead(s) III aVF Confirmed by Rochele Raring [phone]) on 05/20/2021 4:02:43 AM        ____________________________________________  RADIOLOGY Normajean Baxter Kelin Borum, personally viewed and evaluated these images (plain radiographs) as part of my medical decision making, as well as reviewing the written report by the radiologist.  ED MD interpretation: CT head and cervical spine show no acute traumatic injury.  Official radiology report(s): CT HEAD WO CONTRAST  Result Date: 05/20/2021 CLINICAL DATA:  75 year old male with persistent dizziness. Multiple falls. EXAM: CT HEAD WITHOUT CONTRAST TECHNIQUE: Contiguous axial images were obtained from the base of the skull through the vertex without intravenous contrast. COMPARISON:  Head CT 08/06/2019. FINDINGS: Brain: Stable cerebral volume, within normal limits for age. No midline shift, ventriculomegaly, mass effect, evidence of mass lesion, intracranial hemorrhage or evidence of cortically based acute infarction. Numerous small scattered dystrophic calcifications are redemonstrated in the left hemisphere, most appear to be in the subarachnoid space. Far fewer small calcifications in the right hemisphere. These appear stable. Gray-white matter differentiation is stable since 2020 and within normal limits for age.66 Vascular: Extensive Calcified atherosclerosis at the skull base. No suspicious intracranial vascular  hyperdensity. Skull: No acute osseous abnormality  identified. Sinuses/Orbits: Visualized paranasal sinuses and mastoids are stable and well aerated. Tympanic cavities are clear. Other: No acute orbit or scalp soft tissue finding. IMPRESSION: 1. No acute intracranial abnormality. 2. Stable since 2020 and largely negative for age non contrast CT appearance of the brain; chronic small widely scattered calcifications felt mostly in the subarachnoid space are probably postinflammatory - perhaps Neurocysticercosis if there is a history of travel to an endemic area. Electronically Signed   By: Odessa Fleming M.D.   On: 05/20/2021 05:45   CT Cervical Spine Wo Contrast  Result Date: 05/20/2021 CLINICAL DATA:  75 year old male with persistent dizziness. Multiple falls. EXAM: CT CERVICAL SPINE WITHOUT CONTRAST TECHNIQUE: Multidetector CT imaging of the cervical spine was performed without intravenous contrast. Multiplanar CT image reconstructions were also generated. COMPARISON:  Head CT today reported separately. FINDINGS: Alignment: Mildly exaggerated cervical lordosis and probable exaggerated thoracic kyphosis. Mild levoconvex cervical scoliosis. Cervicothoracic junction alignment is within normal limits. Bilateral posterior element alignment is within normal limits. Skull base and vertebrae: Visualized skull base is intact. No atlanto-occipital dissociation. No acute osseous abnormality identified. Mild motion artifact at the C5 and C6 level. Soft tissues and spinal canal: No prevertebral fluid or swelling. No visible canal hematoma. Bulky calcified carotid atherosclerosis in the neck. But otherwise negative visible noncontrast neck soft tissues. Disc levels: The dominant cervical degenerative finding is right side facet arthropathy, which is severe about the C5 level. No significant cervical spinal stenosis suspected. Upper chest: Visible upper thoracic levels appear grossly intact. Emphysema versus mild septal thickening in  both lung apices. Partially visible subclavian approach left side cardiac pacemaker type leads. Calcified aortic atherosclerosis. Tortuous proximal great vessels. IMPRESSION: 1. No acute traumatic injury identified in the cervical spine. 2. 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 3. Aortic Atherosclerosis (ICD10-I70.0).  Possible emphysema. Electronically Signed   By: Odessa Fleming M.D.   On: 05/20/2021 05:49    ____________________________________________   PROCEDURES  Procedure(s) performed (including Critical Care):  Procedures  LACERATION REPAIR Performed by: Baxter Hire Aliene Tamura Authorized by: Baxter Hire Koltyn Kelsay Consent: Verbal consent obtained. Risks and benefits: risks, benefits and alternatives were discussed Consent given by: patient Patient identity confirmed: provided demographic data Prepped and Draped in normal sterile fashion Wound explored  Laceration Location: Right eyelid  Laceration Length: 3 cm  No Foreign Bodies seen or palpated  Anesthesia: none  Irrigation method: syringe Amount of cleaning: standard  Skin closure: Dermabond  Number of sutures: 0  Technique: Area cleaned using saline and Betadine.  Repaired using Dermabond with good wound approximation.  Wound hemostatic.  Patient tolerance: Patient tolerated the procedure well with no immediate complications.  ____________________________________________   INITIAL IMPRESSION / ASSESSMENT AND PLAN / ED COURSE  As part of my medical decision making, I reviewed the following data within the electronic MEDICAL RECORD NUMBER History obtained from family, Nursing notes reviewed and incorporated, Labs reviewed , EKG interpreted , Old EKG reviewed, CT reviewed, and Notes from prior ED visits         Patient here with 2 unwitnessed falls.  He is on Eliquis.  Will obtain CT of the head and cervical spine.  Does have a laceration above the right eye just below the eyebrow that will need repair.   Wife reports tetanus vaccination is up-to-date.  No other sign of traumatic injury other than a small skin tear to the left arm but no bony deformity or tenderness here.  Wife also reports he has been  more confused and unsteady on his feet but this may be secondary to his Ambien.  No other new medications.  No obvious focal neurologic deficits other than some mild dysarthria which again could be medication related.  No recent infectious symptoms.  He denies chest pain or shortness of breath.  EKG does show ectopy but no ischemia.  Will obtain labs, urine.  He has a pacemaker which we will interrogate as well.  ED PROGRESS  Patient's labs here show mild elevation of his creatinine with no recent for comparison.  Wife is not sure what his creatinine normally runs.  She states she is concerned he is dehydrated because he does not eat or drink well.  We will give him gentle IV hydration here but discussed with wife that given his history of CHF with a EF of 30% that we have to be very cautious.  His pacemaker has been interrogated and there have not been any acute events noted.  His troponin x2 is negative, flat.  CT head and cervical spine show no acute abnormality.  Urine shows no sign of infection and actually shows low specific gravity and no ketones suggestive that he is well-hydrated.  Patient is still very drowsy which I suspect is due to Ambien.  We will continue to closely monitor and hopefully be able to discharge home with his wife.  She states that they do have a walker at home and I have recommended that he use this walker at all times.  They have a PCP for close follow-up with the [place].  At this time, I do not feel there is any life-threatening condition present. I have reviewed, interpreted and discussed all results (EKG, imaging, lab, urine as appropriate) and exam findings with patient/family. I have reviewed nursing notes and appropriate previous records.  I feel the patient is safe to be  discharged home without further emergent workup and can continue workup as an outpatient as needed. Discussed usual and customary return precautions. Patient/family verbalize understanding and are comfortable with this plan.  Outpatient follow-up has been provided as needed. All questions have been answered.    ____________________________________________   FINAL CLINICAL IMPRESSION(S) / ED DIAGNOSES  Final diagnoses:  Fall, initial encounter  Injury of head, initial encounter  Facial laceration, initial encounter  Medication side effect     ED Discharge Orders     None       *Please note:  Thaison Kolodziejski was evaluated in Emergency Department on 05/20/2021 for the symptoms described in the history of present illness. He was evaluated in the context of the global COVID-19 pandemic, which necessitated consideration that the patient might be at risk for infection with the SARS-CoV-2 virus that causes COVID-19. Institutional protocols and algorithms that pertain to the evaluation of patients at risk for COVID-19 are in a state of rapid change based on information released by regulatory bodies including the CDC and federal and state organizations. These policies and algorithms were followed during the patient's care in the ED.  Some ED evaluations and interventions may be delayed as a result of limited staffing during and the pandemic.*   Note:  This document was prepared using Dragon voice recognition software and may include unintentional dictation errors.    Weslynn Ke, Layla Maw, DO 05/20/21 [phone]

## 2021-05-20 NOTE — ED Notes (Signed)
This RN assisted pt to ambulate in room.  Pt steady on his feet, no stumbling, no distress, states that he feels steady.

## 2021-05-20 NOTE — ED Triage Notes (Addendum)
Pt states he has been dizzy " all day" and has had multiple falls over last hour. Pt with multiple skin tears and a laceration over right eye. Per spouse pt has been disoriented as well. Pt is confused currently. Spouse states pt also took an [place] pta.

## 2021-07-23 DIAGNOSIS — Z95810 Presence of automatic (implantable) cardiac defibrillator: Secondary | ICD-10-CM

## 2021-07-23 HISTORY — PX: CARDIAC DEFIBRILLATOR PLACEMENT: SHX171

## 2021-07-23 HISTORY — DX: Presence of automatic (implantable) cardiac defibrillator: Z95.810

## 2021-07-23 HISTORY — PX: OTHER SURGICAL HISTORY: SHX169

## 2021-09-10 HISTORY — PX: OTHER SURGICAL HISTORY: SHX169

## 2022-01-23 HISTORY — PX: LEFT HEART CATH AND CORS/GRAFTS ANGIOGRAPHY: CATH118250

## 2022-02-08 ENCOUNTER — Ambulatory Visit (INDEPENDENT_AMBULATORY_CARE_PROVIDER_SITE_OTHER): Payer: Medicare Other

## 2022-02-08 ENCOUNTER — Encounter: Payer: Self-pay | Admitting: Emergency Medicine

## 2022-02-08 ENCOUNTER — Ambulatory Visit
Admission: EM | Admit: 2022-02-08 | Discharge: 2022-02-08 | Disposition: A | Discharge status: Home / Self Care | Payer: Medicare Other

## 2022-02-08 ENCOUNTER — Other Ambulatory Visit: Payer: Self-pay

## 2022-02-08 DIAGNOSIS — H66002 Acute suppurative otitis media without spontaneous rupture of ear drum, left ear: Secondary | ICD-10-CM

## 2022-02-08 DIAGNOSIS — J0100 Acute maxillary sinusitis, unspecified: Secondary | ICD-10-CM

## 2022-02-08 DIAGNOSIS — R059 Cough, unspecified: Secondary | ICD-10-CM | POA: Diagnosis not present

## 2022-02-08 MED ORDER — AMOXICILLIN-POT CLAVULANATE 875-125 MG PO TABS: 1.0000 | ORAL_TABLET | Freq: Two times a day (BID) | ORAL | 0 refills | Status: AC

## 2022-02-08 NOTE — Discharge Instructions (Addendum)
-  Chest x-ray is normal.  No pneumonia. -You have an ear infection and a sinus infection.  I have sent antibiotics to the pharmacy.  It looks like you have had this medication in the past.  I expect you should be feeling some relief in the next 2 days.  Take full course of medication. - Tylenol for discomfort. - Nasal saline. - No forceful blowing of your nose or you may continue to have bleeding. - Make a follow-up appointment with your primary care provider so they can reexamine you and especially recheck your ear in the next 7 to 10 days.

## 2022-02-08 NOTE — ED Provider Notes (Signed)
[facility]    CSN: [id number] Arrival date & time: 02/08/22  1255      History   Chief Complaint Chief Complaint  Patient presents with   URI    HPI Danny Marquez is a 76 y.o. male presenting for 1-1/2-week history of nasal congestion and sinus pressure.  He reports over the past couple of days he started to have significant left sided sinus pain, headache and ear pain.  Patient also reports discolored nasal drainage and blood-tinged drainage.  Reports cough productive of yellowish sputum.  No associated fevers or fatigue.  Denies sore throat, chest pain, breathing difficulty, nausea/vomiting or diarrhea.  Reports he went to his dentist yesterday because he thought the pain he was having in his sinus area was related to a dental issue.  He reports the dentist took x-rays and did not find any evidence of dental infection.  Patient reports contacting [facility] today about his symptoms and advised them to be seen in urgent care as it sounded like he could have a sinus infection.  Medical history significant for atrial fibrillation, anticoagulated with Eliquis.  Other history significant for CHF, previous MI, hypertension, hyperlipidemia and solitary kidney.  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   History of heart disorder 03/23/2019   History of myocardial infarction 03/23/2019   History of pancreatitis 03/23/2019   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   Status post lumbar spine operation  03/23/2019   Numbness and tingling of foot 01/15/2018   Weakness of foot 01/15/2018   Burning sensation of feet 01/08/2018   Anemia 03/01/2017   Atrial fibrillation with RVR (HCC) 12/08/2016   Atrial fibrillation (HCC) 12/08/2016   Chronic systolic congestive heart failure (HCC) 08/09/2014   Hyperlipidemia 08/09/2014   Hypertension 08/09/2014    Past Surgical History:  Procedure Laterality Date   BACK SURGERY     CARDIOVERSION N/A 01/23/2017   Procedure: CARDIOVERSION;  Surgeon: Dalia Heading, MD;  Location: [facility] ORS;  Service: Cardiovascular;  Laterality: N/A;   CARDIOVERSION N/A 02/01/2017   Procedure: CARDIOVERSION;  Surgeon: Dalia Heading, MD;  Location: [facility] ORS;  Service: Cardiovascular;  Laterality: N/A;   COLONOSCOPY WITH PROPOFOL N/A 03/05/2019   Procedure: COLONOSCOPY WITH PROPOFOL;  Surgeon: Christena Deem, MD;  Location: [facility] ENDOSCOPY;  Service: Endoscopy;  Laterality: N/A;   CORONARY ARTERY BYPASS GRAFT     ESOPHAGOGASTRODUODENOSCOPY (EGD) WITH PROPOFOL N/A 03/05/2019   Procedure: ESOPHAGOGASTRODUODENOSCOPY (EGD) WITH PROPOFOL;  Surgeon: Christena Deem, MD;  Location: [facility] ENDOSCOPY;  Service: Endoscopy;  Laterality: N/A;   JOINT REPLACEMENT     both knees 13 years ago, partial shoulder right       Home Medications    Prior to Admission medications   Medication Sig Start Date End Date Taking? Authorizing Provider  amoxicillin-clavulanate (AUGMENTIN) 875-125 MG tablet Take 1 tablet by mouth every 12 (twelve) hours for 10 days. 02/08/22 02/18/22 Yes Shirlee Latch, PA-C  apixaban Everlene Balls) 5  MG TABS tablet Take 5 mg by mouth 2 (two) times daily.   Yes [provider]  DONEPEZIL HCL PO Take 10 mg by mouth.   Yes [provider]  empagliflozin (JARDIANCE) 25 MG TABS tablet Take 12.5 mg by mouth daily.   Yes [provider]  losartan (COZAAR) 25 MG tablet Take 12.5 mg by mouth daily.   Yes [provider]  ranolazine (RANEXA)  500 MG 12 hr tablet Take 500 mg by mouth 2 (two) times daily.   Yes [provider]  spironolactone (ALDACTONE) 25 MG tablet Take 25 mg by mouth daily.   Yes [provider]  torsemide (DEMADEX) 20 MG tablet Take 20 mg by mouth daily.   Yes [provider]  TRAZODONE HCL PO Take 75 mg by mouth.   Yes [provider]  acetaminophen (TYLENOL) 500 MG tablet Take 1,000-1,500 mg by mouth 2 (two) times daily as needed for moderate pain or headache.    [provider]  allopurinol (ZYLOPRIM) 100 MG tablet Take 200 mg by mouth daily. 02/18/19   [provider]  amiodarone (PACERONE) 200 MG tablet Take 100 mg by mouth 2 (two) times daily.    [provider]  brimonidine (ALPHAGAN) 0.2 % ophthalmic solution brimonidine 0.2 % eye drops Patient not taking: Reported on 02/08/2022    [provider]  chlordiazePOXIDE (LIBRIUM) 25 MG capsule Take 4 tablets a day on day 1, and decreased by 1 tablet daily until gone. Patient not taking: Reported on 03/05/2019 04/12/17   [name], [name], MD  Cholecalciferol (VITAMIN D-1000 MAX ST) 25 MCG (1000 UT) tablet Take by mouth.    [provider]  colchicine 0.6 MG tablet Take 0.6 mg by mouth 2 (two) times daily. Patient not taking: Reported on 02/08/2022 01/23/19   [provider]  ferrous sulfate 325 (65 FE) MG EC tablet Take 1 tablet (325 mg total) by mouth 2 (two) times daily. Patient not taking: Reported on 02/08/2022 03/02/17 03/02/18  [name], [name], MD  furosemide (LASIX) 40 MG tablet Take 40mg s by mouth once daily, may take an additional 40mg s once daily as needed for swelling or weight gain Patient not taking: Reported on 02/08/2022 06/22/16   [provider]  gabapentin (NEURONTIN) 400 MG capsule Take 1 capsule (400 mg total) by mouth 3 (three) times daily. Patient not taking: Reported on 03/24/2019 06/19/17   Lenn Sinkegal, Norman S, DPM  gabapentin (NEURONTIN) 400 MG  capsule Take 1 capsule (400 mg total) by mouth 3 (three) times daily. Patient not taking: Reported on 03/24/2019 12/20/17   Lenn Sinkegal, Norman S, DPM  HYDROcodone-acetaminophen (NORCO/VICODIN) 5-325 MG tablet hydrocodone 5 mg-acetaminophen 325 mg tablet  TAKE 1 TABLET BY MOUTH EVERY 6 HOURS AS NEEDED FOR PAIN Patient not taking: Reported on 02/08/2022    [provider]  ketorolac (ACULAR) 0.5 % ophthalmic solution ketorolac 0.5 % eye drops Patient not taking: Reported on 02/08/2022    [provider]  Melatonin 10 MG TABS Take 10 mg by mouth at bedtime.    [provider]  metoprolol (LOPRESSOR) 50 MG tablet Take 1 tablet (50 mg total) by mouth 2 (two) times daily. Patient not taking: Reported on 03/24/2019 12/09/16   [name], Sital, MD  Naphazoline HCl (CLEAR EYES OP) Apply 1 drop to eye daily as needed (dry eyes). Patient not taking: Reported on 02/08/2022    [provider]  ofloxacin (OCUFLOX) 0.3 % ophthalmic solution ofloxacin 0.3 % eye drops  Patient not taking: Reported on 02/08/2022    [provider]  omeprazole (PRILOSEC) 20 MG capsule Take 20 mg by mouth daily. Patient not taking: Reported on 02/08/2022    [provider]  ondansetron (ZOFRAN ODT) 4 MG disintegrating tablet Take 1 tablet (4 mg total) by mouth every 8 (eight) hours as needed for nausea or vomiting. 08/06/19   Irean Hong, MD  pantoprazole (PROTONIX) 40 MG tablet TAKE 1 TABLET (40 MG TOTAL) BY MOUTH 2 (TWO) TIMES DAILY BEFORE MEALS 02/13/19   [provider]  potassium chloride SA (KLOR-CON M) 20 MEQ tablet TAKE 1 TABLET (20 MEQ TOTAL) BY MOUTH ONCE DAILY. AND EXTRA TABLET IF TAKES 2ND TABLET OF LASIX Patient not taking: Reported on 02/08/2022 05/01/16   [provider]  prednisoLONE acetate (PRED FORTE) 1 % ophthalmic suspension prednisolone acetate 1 % eye drops,suspension Patient not taking: Reported on 02/08/2022    [provider]  predniSONE (DELTASONE) 10 MG  tablet prednisone 10 mg tablet  USE AS DIRECTED ON INCLUDED SHEET OF PAPER    [provider]  predniSONE (STERAPRED UNI-PAK 21 TAB) 5 MG (21) TBPK tablet TAKE 6 TABLETS ON DAY 1 AS DIRECTED ON PACKAGE AND DECREASE BY 1 TAB EACH DAY FOR A TOTAL OF 6 DAYS 11/21/18   [provider]  pregabalin (LYRICA) 50 MG capsule Take 50 mg in the morning, 50 mg in the afternoon, and 100 mg at night 04/25/18   [provider]  ramipril (ALTACE) 5 MG capsule Take 5 mg by mouth daily. Patient not taking: Reported on 02/08/2022 01/26/17   [provider]  rosuvastatin (CRESTOR) 5 MG tablet  02/14/19   [provider]  sertraline (ZOLOFT) 100 MG tablet Take 100 mg by mouth daily.  08/23/16   [provider]  Tapentadol HCl (NUCYNTA) 100 MG TABS Nucynta 100 mg tablet  Take 1 tablet every 4 hours by oral route. Patient not taking: Reported on 02/08/2022    [provider]  zolpidem (AMBIEN) 10 MG tablet Take 10 mg by mouth at bedtime.  Patient not taking: Reported on 02/08/2022 08/29/16   [provider]    Family History Family History  Problem Relation Age of Onset   Heart attack Father     Social History Social History   Tobacco Use   Smoking status: Former    Years: 15.00    Types: Cigarettes   Smokeless tobacco: Never  Vaping Use   Vaping Use: Never used  Substance Use Topics   Alcohol use: Not Currently    Alcohol/week: 24.0 standard drinks    Types: 24 Glasses of wine per week   Drug use: Not Currently     Allergies   Codeine and Rosuvastatin   Review of Systems Review of Systems  Constitutional:  Negative for fatigue and fever.  HENT:  Positive for congestion, ear pain, postnasal drip, rhinorrhea and sinus pressure. Negative for sinus pain and sore throat.   Respiratory:  Positive for cough. Negative for shortness of breath.   Cardiovascular:  Negative for chest pain.  Gastrointestinal:  Negative for abdominal pain,  diarrhea, nausea and vomiting.  Musculoskeletal:  Negative for myalgias.  Neurological:  Positive for headaches. Negative for weakness and light-headedness.  Hematological:  Negative for adenopathy.    Physical Exam Triage Vital Signs ED Triage Vitals  Enc Vitals Group     BP 02/08/22 1327 (!) 104/54     Pulse Rate 02/08/22 1327 60  Resp 02/08/22 1327 20     Temp 02/08/22 1327 97.7 F (36.5 C)     Temp Source 02/08/22 1327 Oral     SpO2 02/08/22 1327 92 %     Weight --      Height --      Head Circumference --      Peak Flow --      Pain Score 02/08/22 1315 9     Pain Loc --      Pain Edu? --      Excl. in GC? --    No data found.  Updated Vital Signs BP (!) 104/54 (BP Location: Right Arm)   Pulse 60   Temp 97.7 F (36.5 C) (Oral)   Resp 20   SpO2 99%      Physical Exam Vitals and nursing note reviewed.  Constitutional:      General: He is not in acute distress.    Appearance: Normal appearance. He is well-developed. He is ill-appearing.  HENT:     Head: Normocephalic and atraumatic.     Right Ear: Tympanic membrane, ear canal and external ear normal.     Left Ear: Ear canal and external ear normal. A middle ear effusion is present. Tympanic membrane is erythematous and bulging.     Nose: Mucosal edema and congestion present.     Left Sinus: Maxillary sinus tenderness present.     Mouth/Throat:     Mouth: Mucous membranes are moist.     Pharynx: Oropharynx is clear. Posterior oropharyngeal erythema (mild with light yellow/blood tinged PND (left side)) present.  Eyes:     General: No scleral icterus.    Conjunctiva/sclera: Conjunctivae normal.  Cardiovascular:     Rate and Rhythm: Normal rate and regular rhythm.     Heart sounds: No murmur heard. Pulmonary:     Effort: Pulmonary effort is normal. No respiratory distress.     Breath sounds: Normal breath sounds.  Musculoskeletal:     Cervical back: Neck supple.  Skin:    General: Skin is warm and dry.      Capillary Refill: Capillary refill takes less than 2 seconds.  Neurological:     General: No focal deficit present.     Mental Status: He is alert. Mental status is at baseline.     Motor: No weakness.  Psychiatric:        Mood and Affect: Mood normal.        Behavior: Behavior normal.        Thought Content: Thought content normal.     UC Treatments / Results  Labs (all labs ordered are listed, but only abnormal results are displayed) Labs Reviewed - No data to display  EKG   Radiology DG Chest 2 View  Result Date: 02/08/2022 CLINICAL DATA:  Cough, congestion, 0292% EXAM: CHEST - 2 VIEW COMPARISON:  Chest radiograph dated August 06, 2019 FINDINGS: The heart is enlarged. Evidence of prior coronary artery bypass grafting. 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 Lungs are clear without evidence of focal consolidation or pleural effusion. Thoracic spondylosis. No acute osseous abnormality. IMPRESSION: 1. Stable cardiomegaly. 2. Lungs are clear without evidence of focal consolidation or pleural effusion. Electronically Signed   By: Larose Hires D.O.   On: 02/08/2022 14:03    Procedures Procedures (including critical care time)  Medications Ordered in UC Medications - No data to display  Initial Impression / Assessment and Plan / UC Course  I have reviewed the  triage vital signs and the nursing notes.  Pertinent labs & imaging results that were available during my care of the patient were reviewed by me and considered in my medical decision making (see chart for details).  76 year old male presenting for 1 and half week history of nasal congestion and sinus pressure which is worsened over the past couple of days and started to cause him to have significant left sided maxillary facial pain with radiation of pain into teeth, left ear and left forehead.  Also reports discolored nasal drainage with blood-tinged coloration.  No reported fevers.  No breathing  difficulty.  Patient is afebrile.  He is overall well-appearing.  Exam significant for nasal congestion, light yellowish postnasal drainage with blood-tinge on the left half of the posterior pharynx.  Additionally his left TM is significantly erythematous and bulging.  Positive left maxillary sinus tenderness.  Chest clear to auscultation.  Chest x-ray obtained.  No evidence of pneumonia.  Advised patient symptoms consistent with acute sinusitis and otitis media.  Suspect secondary bacterial infection.  We will treat at this time with Augmentin.  Encouraged increasing rest and fluids.  Advised nasal saline.  Advised not to forcefully blow his nose.  Reviewed return and ER precautions.  Denies, advised to follow-up with his PCP.  Final Clinical Impressions(s) / UC Diagnoses   Final diagnoses:  Acute maxillary sinusitis, recurrence not specified  Acute suppurative otitis media of left ear without spontaneous rupture of tympanic membrane, recurrence not specified     Discharge Instructions      -Chest x-ray is normal.  No pneumonia. -You have an ear infection and a sinus infection.  I have sent antibiotics to the pharmacy.  It looks like you have had this medication in the past.  I expect you should be feeling some relief in the next 2 days.  Take full course of medication. - Tylenol for discomfort. - Nasal saline. - No forceful blowing of your nose or you may continue to have bleeding. - Make a follow-up appointment with your primary care provider so they can reexamine you and especially recheck your ear in the next 7 to 10 days.     ED Prescriptions     Medication Sig Dispense Auth. Provider   amoxicillin-clavulanate (AUGMENTIN) 875-125 MG tablet Take 1 tablet by mouth every 12 (twelve) hours for 10 days. 20 tablet Gareth Morgan      PDMP not reviewed this encounter.   Shirlee Latch, PA-C 02/08/22 1445

## 2022-02-08 NOTE — ED Triage Notes (Signed)
Facial congestion started one week ago.  Has been blowing green secretions and blood tinged from nose.  Patient has had left facial pain/jaw pain .  Went to the dentist yesterday and was told nothing is wrong with teeth.  Headache pain is particularly bad on left side of head.    Spoke to [place] and suggested patient be seen today.

## 2022-02-12 ENCOUNTER — Encounter: Payer: No Typology Code available for payment source | Attending: Internal Medicine | Admitting: *Deleted

## 2022-02-12 ENCOUNTER — Telehealth: Payer: Self-pay | Admitting: Physician Assistant

## 2022-02-12 DIAGNOSIS — Z5189 Encounter for other specified aftercare: Secondary | ICD-10-CM | POA: Insufficient documentation

## 2022-02-12 DIAGNOSIS — I5022 Chronic systolic (congestive) heart failure: Secondary | ICD-10-CM | POA: Insufficient documentation

## 2022-02-12 MED ORDER — PREDNISONE 20 MG PO TABS: 40.0000 mg | ORAL_TABLET | Freq: Every day | ORAL | 0 refills | Status: AC

## 2022-02-12 NOTE — Progress Notes (Signed)
Virtual orientation call completed today. he has an appointment on Date: 02/22/2022  for EP eval and gym Orientation.  Documentation of diagnosis can be found in MEDIA TAB/[facility] records Date: 02/22/2022 .

## 2022-02-12 NOTE — Telephone Encounter (Signed)
Patient called stating he was not feeling any better. Patient 4 days ago for sinusitis and left otitis media.  Started him on Augmentin.  At this time, advised patient to restart Sudafed PE and Flonase.  Sent prednisone to pharmacy.  If not feeling better in 2 to 3 days he should return for reevaluation unless symptoms worsen before hand.

## 2022-02-21 ENCOUNTER — Ambulatory Visit
Admission: EM | Admit: 2022-02-21 | Discharge: 2022-02-21 | Disposition: A | Discharge status: Home / Self Care | Payer: No Typology Code available for payment source | Attending: Internal Medicine | Admitting: Internal Medicine

## 2022-02-21 DIAGNOSIS — H6692 Otitis media, unspecified, left ear: Secondary | ICD-10-CM

## 2022-02-21 DIAGNOSIS — J339 Nasal polyp, unspecified: Secondary | ICD-10-CM | POA: Diagnosis not present

## 2022-02-21 MED ORDER — CEFDINIR 300 MG PO CAPS: 300.0000 mg | ORAL_CAPSULE | Freq: Two times a day (BID) | ORAL | 0 refills | Status: DC

## 2022-02-21 MED ORDER — HYDROCODONE-ACETAMINOPHEN 5-325 MG PO TABS: 1.0000 | ORAL_TABLET | Freq: Four times a day (QID) | ORAL | 0 refills | Status: DC | PRN

## 2022-02-21 NOTE — ED Triage Notes (Signed)
Pt was seen on 02/08/2022 with cough and congestion. Today he c/o left ear pain.

## 2022-02-21 NOTE — Discharge Instructions (Addendum)
Continue the Flonase the way I taught you  Follow up with ear nose and throat doctor

## 2022-02-21 NOTE — ED Provider Notes (Addendum)
[facility]    CSN: [id number] Arrival date & time: 02/21/22  1733      History   Chief Complaint Chief Complaint  Patient presents with   Ear Pain    HPI Danny Marquez is a 76 y.o. male who presents with recurrent L ear pain after taking a nap today. Had been doing well since done with Antibiotics and Prednisone. Has continued using the Sudafed. Denies fever. 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   History of heart disorder 03/23/2019   History of myocardial infarction 03/23/2019   History of pancreatitis 03/23/2019   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   Status post lumbar spine operation 03/23/2019   Numbness and tingling of foot 01/15/2018   Weakness of foot 01/15/2018   Burning sensation of feet 01/08/2018   Anemia 03/01/2017   Atrial fibrillation with RVR (HCC) 12/08/2016   Atrial fibrillation (HCC) 12/08/2016   Chronic systolic congestive heart failure (HCC) 08/09/2014   Hyperlipidemia 08/09/2014   Hypertension 08/09/2014    Past Surgical History:  Procedure Laterality Date   BACK SURGERY     CARDIOVERSION N/A 01/23/2017   Procedure: CARDIOVERSION;  Surgeon: Dalia HeadingFath, Kenneth A, MD;  Location: [facility] ORS;  Service: Cardiovascular;  Laterality: N/A;   CARDIOVERSION N/A 02/01/2017   Procedure: CARDIOVERSION;  Surgeon: Dalia HeadingFath, Kenneth A, MD;  Location: [facility] ORS;  Service: Cardiovascular;  Laterality: N/A;   COLONOSCOPY WITH  PROPOFOL N/A 03/05/2019   Procedure: COLONOSCOPY WITH PROPOFOL;  Surgeon: Christena DeemSkulskie, Martin U, MD;  [facility];  Service: Endoscopy;  Laterality: N/A;   CORONARY ARTERY BYPASS GRAFT     ESOPHAGOGASTRODUODENOSCOPY (EGD) WITH PROPOFOL N/A 03/05/2019   Procedure: ESOPHAGOGASTRODUODENOSCOPY (EGD) WITH PROPOFOL;  Surgeon: Christena DeemSkulskie, Martin U, MD;  [facility];  Service: Endoscopy;  Laterality: N/A;   JOINT REPLACEMENT     both knees 13 years ago, partial shoulder right       Home Medications    Prior to Admission medications   Medication Sig Start Date End Date Taking? Authorizing Provider  cefdinir (OMNICEF) 300 MG capsule Take 1 capsule (300 mg total) by mouth 2 (two) times daily. 02/21/22  Yes Rodriguez-Southworth, Nettie ElmSylvia, PA-C  HYDROcodone-acetaminophen (NORCO/VICODIN) 5-325 MG tablet Take 1 tablet by mouth every 6 (six) hours as needed. 02/21/22  Yes Rodriguez-Southworth, Nettie ElmSylvia, PA-C  allopurinol (ZYLOPRIM) 100 MG tablet Take 200 mg by mouth daily. 02/18/19   [provider]  amiodarone (PACERONE) 200 MG tablet Take 100 mg by mouth 2 (two) times daily.    [provider]  apixaban (ELIQUIS) 5 MG TABS tablet Take 5 mg by mouth 2 (two) times daily.    [provider]  apixaban (ELIQUIS) 5 MG TABS tablet Take 5 mg by mouth 2 (two) times daily.    [provider]  DONEPEZIL HCL PO Take 10 mg by mouth.    [provider]  empagliflozin (  JARDIANCE) 25 MG TABS tablet Take 12.5 mg by mouth daily.    [provider]  pregabalin (LYRICA) 50 MG capsule Take 50 mg in the morning, 50 mg in the afternoon, and 100 mg at night 04/25/18   [provider]  ranolazine (RANEXA) 500 MG 12 hr tablet Take 500 mg by mouth 2 (two) times daily.    [provider]  rosuvastatin (CRESTOR) 5 MG tablet  02/14/19   [provider]  sertraline (ZOLOFT) 100 MG tablet Take 100 mg by mouth daily.  08/23/16   [provider]  spironolactone (ALDACTONE) 25 MG tablet Take 25 mg by mouth daily.    [provider]  torsemide (DEMADEX) 20 MG tablet Take 20 mg by mouth daily.    [provider]  TRAZODONE HCL PO Take 75 mg by mouth.    [provider]    Family History Family History  Problem Relation Age of Onset   Heart attack Father     Social History Social History   Tobacco Use   Smoking status: Former    Years: 15.00    Types: Cigarettes   Smokeless tobacco: Never  Vaping Use   Vaping Use: Never used  Substance Use Topics   Alcohol use: Not Currently    Alcohol/week: 24.0 standard drinks of alcohol    Types: 24 Glasses of wine per week   Drug use: Not Currently     Allergies   Codeine and Rosuvastatin   Review of Systems Review of Systems  Constitutional:  Negative for fever.  HENT:  Positive for congestion and ear pain. Negative for ear discharge.   Respiratory:  Negative for cough.      Physical Exam Triage Vital Signs ED Triage Vitals  Enc Vitals Group     BP 02/21/22 1744 96/66     Pulse --      Resp 02/21/22 1743 16     Temp 02/21/22 1743 98.8 F (37.1 C)     Temp Source 02/21/22 1743 Oral     SpO2 02/21/22 1743 99 %     Weight --      Height --      Head Circumference --      Peak Flow --      Pain Score 02/21/22 1812 10     Pain Loc --      Pain Edu? --      Excl. in GC? --    No data found.  Updated Vital Signs BP 96/66   Temp 98.8 F (37.1 C) (Oral)   Resp 18   SpO2 99%   Visual Acuity Right Eye Distance:   Left Eye Distance:   Bilateral Distance:    Right Eye Near:   Left Eye Near:    Bilateral Near:     Physical Exam Vitals and nursing note reviewed.  Constitutional:      General: He is not in acute distress.    Appearance: He is not toxic-appearing.  HENT:     Right Ear: Tympanic membrane, ear canal and external ear normal.     Left Ear: Tympanic membrane is erythematous and bulging.     Nose:      Left Turbinates: Swollen.     Comments: Polyp noted on the L Musculoskeletal:     Cervical back: Neck supple.  Lymphadenopathy:     Cervical: No cervical adenopathy.  Neurological:     Mental Status: He is alert.  UC Treatments / Results  Labs (all labs ordered are listed, but only abnormal results are displayed) Labs Reviewed - No data to display  EKG   Radiology No results found.  Procedures Procedures (including critical care time)  Medications Ordered in UC Medications - No data to display  Initial Impression / Assessment and Plan / UC Course  I have reviewed the triage vital signs and the nursing notes.  Recurrent L OM L nose polyp  I placed him on Cefdinier, Norco as noted Needs to FU with ENT Final Clinical Impressions(s) / UC Diagnoses   Final diagnoses:  Acute otitis media, left  Nose polyp     Discharge Instructions      Continue the Flonase the way I taught you  Follow up with ear nose and throat doctor    ED Prescriptions     Medication Sig Dispense Auth. Provider   cefdinir (OMNICEF) 300 MG capsule Take 1 capsule (300 mg total) by mouth 2 (two) times daily. 20 capsule Rodriguez-Southworth, Nettie Elm, PA-C   HYDROcodone-acetaminophen (NORCO/VICODIN) 5-325 MG tablet Take 1 tablet by mouth every 6 (six) hours as needed. 10 tablet Rodriguez-Southworth, Nettie Elm, PA-C      I have reviewed the PDMP during this encounter.   Garey Ham, PA-C 02/21/22 1816    Rodriguez-Southworth, Nettie Elm, PA-C 02/21/22 1817

## 2022-02-22 VITALS — Ht 65.75 in | Wt 164.7 lb

## 2022-02-22 DIAGNOSIS — I5022 Chronic systolic (congestive) heart failure: Secondary | ICD-10-CM

## 2022-02-22 DIAGNOSIS — Z5189 Encounter for other specified aftercare: Secondary | ICD-10-CM | POA: Diagnosis not present

## 2022-02-22 NOTE — Patient Instructions (Signed)
Patient Instructions  Patient Details  Name: Danny Marquez MRN: [id number] Date of Birth: [birth year] Referring Provider:  Ileene Hutchinson, MD  Below are your personal goals for exercise, nutrition, and risk factors. Our goal is to help you stay on track towards obtaining and maintaining these goals. We will be discussing your progress on these goals with you throughout the program.  Initial Exercise Prescription:  Initial Exercise Prescription - 02/22/22 1600       Date of Initial Exercise RX and Referring Provider   Date 02/22/22    Referring Provider Daron Offer MD      Oxygen   Maintain Oxygen Saturation 88% or higher      Recumbant Bike   Level 1    RPM 60    Watts 10    Minutes 15    METs 2      NuStep   Level 1    SPM 80    Minutes 15    METs 2      Biostep-RELP   Level 1    SPM 50    Minutes 15    METs 2      Track   Laps 14    Minutes 15    METs 1.7      Prescription Details   Frequency (times per week) 3    Duration Progress to 30 minutes of continuous aerobic without signs/symptoms of physical distress      Intensity   THRR 40-80% of Max Heartrate 94- 127    Ratings of Perceived Exertion 11-13    Perceived Dyspnea 0-4      Progression   Progression Continue to progress workloads to maintain intensity without signs/symptoms of physical distress.      Resistance Training   Training Prescription Yes    Weight 4 lb    Reps 10-15             Exercise Goals: Frequency: Be able to perform aerobic exercise two to three times per week in program working toward 2-5 days per week of home exercise.  Intensity: Work with a perceived exertion of 11 (fairly light) - 15 (hard) while following your exercise prescription.  We will make changes to your prescription with you as you progress through the program.   Duration: Be able to do 30 to 45 minutes of continuous aerobic exercise in addition to a 5 minute warm-up and a 5 minute cool-down  routine.   Nutrition Goals: Your personal nutrition goals will be established when you do your nutrition analysis with the dietician.  The following are general nutrition guidelines to follow: Cholesterol < 200mg /day Sodium < 1500mg /day Fiber: Men over 50 yrs - 30 grams per day  Personal Goals:  Personal Goals and Risk Factors at Admission - 02/22/22 1635       Core Components/Risk Factors/Patient Goals on Admission    Weight Management Yes;Weight Maintenance    Intervention Weight Management: Develop a combined nutrition and exercise program designed to reach desired caloric intake, while maintaining appropriate intake of nutrient and fiber, sodium and fats, and appropriate energy expenditure required for the weight goal.;Weight Management: Provide education and appropriate resources to help participant work on and attain dietary goals.;Weight Management/Obesity: Establish reasonable short term and long term weight goals.    Admit Weight 164 lb (74.4 kg)    Goal Weight: Short Term 164 lb (74.4 kg)    Goal Weight: Long Term 164 lb (74.4 kg)    Expected Outcomes Short Term:  Continue to assess and modify interventions until short term weight is achieved;Long Term: Adherence to nutrition and physical activity/exercise program aimed toward attainment of established weight goal;Weight Maintenance: Understanding of the daily nutrition guidelines, which includes 25-35% calories from fat, 7% or less cal from saturated fats, less than 200mg  cholesterol, less than 1.5gm of sodium, & 5 or more servings of fruits and vegetables daily;Understanding recommendations for meals to include 15-35% energy as protein, 25-35% energy from fat, 35-60% energy from carbohydrates, less than 200mg  of dietary cholesterol, 20-35 gm of total fiber daily;Understanding of distribution of calorie intake throughout the day with the consumption of 4-5 meals/snacks    Heart Failure Yes    Intervention Provide a combined exercise  and nutrition program that is supplemented with education, support and counseling about heart failure. Directed toward relieving symptoms such as shortness of breath, decreased exercise tolerance, and extremity edema.    Expected Outcomes Improve functional capacity of life;Short term: Attendance in program 2-3 days a week with increased exercise capacity. Reported lower sodium intake. Reported increased fruit and vegetable intake. Reports medication compliance.;Short term: Daily weights obtained and reported for increase. Utilizing diuretic protocols set by physician.;Long term: Adoption of self-care skills and reduction of barriers for early signs and symptoms recognition and intervention leading to self-care maintenance.    Hypertension Yes    Intervention Provide education on lifestyle modifcations including regular physical activity/exercise, weight management, moderate sodium restriction and increased consumption of fresh fruit, vegetables, and low fat dairy, alcohol moderation, and smoking cessation.;Monitor prescription use compliance.    Expected Outcomes Short Term: Continued assessment and intervention until BP is < 140/9mm HG in hypertensive participants. < 130/66mm HG in hypertensive participants with diabetes, heart failure or chronic kidney disease.;Long Term: Maintenance of blood pressure at goal levels.    Lipids Yes    Intervention Provide education and support for participant on nutrition & aerobic/resistive exercise along with prescribed medications to achieve LDL 70mg , HDL >40mg .    Expected Outcomes Short Term: Participant states understanding of desired cholesterol values and is compliant with medications prescribed. Participant is following exercise prescription and nutrition guidelines.;Long Term: Cholesterol controlled with medications as prescribed, with individualized exercise RX and with personalized nutrition plan. Value goals: LDL < 70mg , HDL > 40 mg.              Tobacco Use Initial Evaluation: Social History   Tobacco Use  Smoking Status Former   Years: 15.00   Types: Cigarettes  Smokeless Tobacco Never    Exercise Goals and Review:  Exercise Goals     Row Name 02/22/22 1634             Exercise Goals   Increase Physical Activity Yes       Intervention Provide advice, education, support and counseling about physical activity/exercise needs.;Develop an individualized exercise prescription for aerobic and resistive training based on initial evaluation findings, risk stratification, comorbidities and participant's personal goals.       Expected Outcomes Short Term: Attend rehab on a regular basis to increase amount of physical activity.;Long Term: Add in home exercise to make exercise part of routine and to increase amount of physical activity.;Long Term: Exercising regularly at least 3-5 days a week.       Increase Strength and Stamina Yes       Intervention Provide advice, education, support and counseling about physical activity/exercise needs.;Develop an individualized exercise prescription for aerobic and resistive training based on initial evaluation findings, risk stratification, comorbidities and  participant's personal goals.       Expected Outcomes Short Term: Increase workloads from initial exercise prescription for resistance, speed, and METs.;Short Term: Perform resistance training exercises routinely during rehab and add in resistance training at home;Long Term: Improve cardiorespiratory fitness, muscular endurance and strength as measured by increased METs and functional capacity ( )       Able to understand and use rate of perceived exertion (RPE) scale Yes       Intervention Provide education and explanation on how to use RPE scale       Expected Outcomes Short Term: Able to use RPE daily in rehab to express subjective intensity level;Long Term:  Able to use RPE to guide intensity level when exercising independently        Able to understand and use Dyspnea scale Yes       Intervention Provide education and explanation on how to use Dyspnea scale       Expected Outcomes Short Term: Able to use Dyspnea scale daily in rehab to express subjective sense of shortness of breath during exertion;Long Term: Able to use Dyspnea scale to guide intensity level when exercising independently       Knowledge and understanding of Target Heart Rate Range (THRR) Yes       Intervention Provide education and explanation of THRR including how the numbers were predicted and where they are located for reference       Expected Outcomes Short Term: Able to state/look up THRR;Long Term: Able to use THRR to govern intensity when exercising independently;Short Term: Able to use daily as guideline for intensity in rehab       Able to check pulse independently Yes       Intervention Provide education and demonstration on how to check pulse in carotid and radial arteries.;Review the importance of being able to check your own pulse for safety during independent exercise       Expected Outcomes Short Term: Able to explain why pulse checking is important during independent exercise;Long Term: Able to check pulse independently and accurately       Understanding of Exercise Prescription Yes       Intervention Provide education, explanation, and written materials on patient's individual exercise prescription       Expected Outcomes Short Term: Able to explain program exercise prescription;Long Term: Able to explain home exercise prescription to exercise independently                Copy of goals given to participant.

## 2022-02-22 NOTE — Progress Notes (Signed)
Cardiac Individual Treatment Plan  Patient Details  Name: Danny Marquez MRN: [id number] Date of Birth: 11-Mar-1946 Referring Provider:   Flowsheet Row Cardiac Rehab from 02/22/2022 in [facility] Cardiac and Pulmonary Rehab  Referring Provider Garth Schlatter MD       Initial Encounter Date:  Flowsheet Row Cardiac Rehab from 02/22/2022 in [facility] Cardiac and Pulmonary Rehab  Date 02/22/22       Visit Diagnosis: Heart failure, chronic systolic ([place])  Patient's Home Medications on Admission:  Current Outpatient Medications:    allopurinol (ZYLOPRIM) 100 MG tablet, Take 200 mg by mouth daily., Disp: , Rfl:    amiodarone (PACERONE) 200 MG tablet, Take 100 mg by mouth 2 (two) times daily., Disp: , Rfl:    apixaban (ELIQUIS) 5 MG TABS tablet, Take 5 mg by mouth 2 (two) times daily., Disp: , Rfl:    apixaban (ELIQUIS) 5 MG TABS tablet, Take 5 mg by mouth 2 (two) times daily., Disp: , Rfl:    cefdinir (OMNICEF) 300 MG capsule, Take 1 capsule (300 mg total) by mouth 2 (two) times daily., Disp: 20 capsule, Rfl: 0   DONEPEZIL HCL PO, Take 10 mg by mouth., Disp: , Rfl:    empagliflozin (JARDIANCE) 25 MG TABS tablet, Take 12.5 mg by mouth daily., Disp: , Rfl:    HYDROcodone-acetaminophen (NORCO/VICODIN) 5-325 MG tablet, Take 1 tablet by mouth every 6 (six) hours as needed., Disp: 10 tablet, Rfl: 0   pregabalin (LYRICA) 50 MG capsule, Take 50 mg in the morning, 50 mg in the afternoon, and 100 mg at night, Disp: , Rfl:    ranolazine (RANEXA) 500 MG 12 hr tablet, Take 500 mg by mouth 2 (two) times daily., Disp: , Rfl:    rosuvastatin (CRESTOR) 5 MG tablet, , Disp: , Rfl:    sertraline (ZOLOFT) 100 MG tablet, Take 100 mg by mouth daily. , Disp: , Rfl:    spironolactone (ALDACTONE) 25 MG tablet, Take 25 mg by mouth daily., Disp: , Rfl:    torsemide (DEMADEX) 20 MG tablet, Take 20 mg by mouth daily., Disp: , Rfl:    TRAZODONE HCL PO, Take 75 mg by mouth., Disp: , Rfl:   Past Medical History: Past  Medical History:  Diagnosis Date   A-fib ([place])    CHF (congestive heart failure) (H[facility])    EF 30%   Chronic kidney disease    congenital one kidney   Hypertension    Myocardial infarction ([place])    Solitary kidney     Tobacco Use: Social History   Tobacco Use  Smoking Status Former   Years: 15.00   Types: Cigarettes  Smokeless Tobacco Never    Labs: Review Flowsheet        No data to display           Exercise Target Goals: Exercise Program Goal: Individual exercise prescription set using results from initial 6 min walk test and THRR while considering  patient's activity barriers and safety.   Exercise Prescription Goal: Initial exercise prescription builds to 30-45 minutes a day of aerobic activity, 2-3 days per week.  Home exercise guidelines will be given to patient during program as part of exercise prescription that the participant will acknowledge.   Education: Aerobic Exercise: - Group verbal and visual presentation on the components of exercise prescription. Introduces F.I.T.T principle from ACSM for exercise prescriptions.  Reviews F.I.T.T. principles of aerobic exercise including progression. Written material given at graduation. Flowsheet Row Cardiac Rehab from 02/22/2022 in [facility] Cardiac  and Pulmonary Rehab  Education need identified 02/22/22       Education: Resistance Exercise: - Group verbal and visual presentation on the components of exercise prescription. Introduces F.I.T.T principle from ACSM for exercise prescriptions  Reviews F.I.T.T. principles of resistance exercise including progression. Written material given at graduation.    Education: Exercise & Equipment Safety: - Individual verbal instruction and demonstration of equipment use and safety with use of the equipment. Flowsheet Row Cardiac Rehab from 02/22/2022 in [facility] Cardiac and Pulmonary Rehab  Education need identified 02/22/22  Date 02/22/22  Educator [place]  Instruction Review Code 1-  Verbalizes Understanding       Education: Exercise Physiology & General Exercise Guidelines: - Group verbal and written instruction with models to review the exercise physiology of the cardiovascular system and associated critical values. Provides general exercise guidelines with specific guidelines to those with heart or lung disease.    Education: Flexibility, Balance, Mind/Body Relaxation: - Group verbal and visual presentation with interactive activity on the components of exercise prescription. Introduces F.I.T.T principle from ACSM for exercise prescriptions. Reviews F.I.T.T. principles of flexibility and balance exercise training including progression. Also discusses the mind body connection.  Reviews various relaxation techniques to help reduce and manage stress (i.e. Deep breathing, progressive muscle relaxation, and visualization). Balance handout provided to take home. Written material given at graduation.   Activity Barriers & Risk Stratification:  Activity Barriers & Cardiac Risk Stratification - 02/22/22 1555       Activity Barriers & Cardiac Risk Stratification   Activity Barriers Balance Concerns;Other (comment);Back Problems;Deconditioning;Right Knee Replacement;Left Knee Replacement;Muscular Weakness;History of Falls;Joint Problems    Comments neuropathy, multiple back surgeries, dizziness    Cardiac Risk Stratification High             6 Minute Walk:  6 Minute Walk     Row Name 02/22/22 1609         6 Minute Walk   Phase Initial     Distance 1020 feet     Walk Time 6 minutes     # of Rest Breaks 0     MPH 1.93     METS 2.07     RPE 10     Perceived Dyspnea  0     VO2 Peak 7.27     Symptoms Yes (comment)     Comments Right knee pain 3/10     Resting HR 62 bpm     Resting BP 98/56     Resting Oxygen Saturation  98 %     Exercise Oxygen Saturation  during 6 min walk 96 %     Max Ex. HR 105 bpm     Max Ex. BP 122/58     2 Minute Post BP 108/60               Oxygen Initial Assessment:   Oxygen Re-Evaluation:   Oxygen Discharge (Final Oxygen Re-Evaluation):   Initial Exercise Prescription:  Initial Exercise Prescription - 02/22/22 1600       Date of Initial Exercise RX and Referring Provider   Date 02/22/22    Referring Provider Garth Schlatter MD      Oxygen   Maintain Oxygen Saturation 88% or higher      Recumbant Bike   Level 1    RPM 60    Watts 10    Minutes 15    METs 2      NuStep   Level 1    SPM 80  Minutes 15    METs 2      Biostep-RELP   Level 1    SPM 50    Minutes 15    METs 2      Track   Laps 14    Minutes 15    METs 1.7      Prescription Details   Frequency (times per week) 3    Duration Progress to 30 minutes of continuous aerobic without signs/symptoms of physical distress      Intensity   THRR 40-80% of Max Heartrate 94- 127    Ratings of Perceived Exertion 11-13    Perceived Dyspnea 0-4      Progression   Progression Continue to progress workloads to maintain intensity without signs/symptoms of physical distress.      Resistance Training   Training Prescription Yes    Weight 4 lb    Reps 10-15             Perform Capillary Blood Glucose checks as needed.  Exercise Prescription Changes:   Exercise Prescription Changes     Row Name 02/22/22 1600             Response to Exercise   Blood Pressure (Admit) 98/56       Blood Pressure (Exercise) 122/58       Blood Pressure (Exit) 108/60       Heart Rate (Admit) 62 bpm       Heart Rate (Exercise) 105 bpm       Heart Rate (Exit) 64 bpm       Oxygen Saturation (Admit) 98 %       Oxygen Saturation (Exercise) 96 %       Oxygen Saturation (Exit) 96 %       Rating of Perceived Exertion (Exercise) 10       Perceived Dyspnea (Exercise) 0       Symptoms Right knee pain 3/10       Comments walk test results                Exercise Comments:   Exercise Goals and Review:   Exercise Goals     Row  Name 02/22/22 1634             Exercise Goals   Increase Physical Activity Yes       Intervention Provide advice, education, support and counseling about physical activity/exercise needs.;Develop an individualized exercise prescription for aerobic and resistive training based on initial evaluation findings, risk stratification, comorbidities and participant's personal goals.       Expected Outcomes Short Term: Attend rehab on a regular basis to increase amount of physical activity.;Long Term: Add in home exercise to make exercise part of routine and to increase amount of physical activity.;Long Term: Exercising regularly at least 3-5 days a week.       Increase Strength and Stamina Yes       Intervention Provide advice, education, support and counseling about physical activity/exercise needs.;Develop an individualized exercise prescription for aerobic and resistive training based on initial evaluation findings, risk stratification, comorbidities and participant's personal goals.       Expected Outcomes Short Term: Increase workloads from initial exercise prescription for resistance, speed, and METs.;Short Term: Perform resistance training exercises routinely during rehab and add in resistance training at home;Long Term: Improve cardiorespiratory fitness, muscular endurance and strength as measured by increased METs and functional capacity (6MWT)       Able to understand and use rate of perceived  exertion (RPE) scale Yes       Intervention Provide education and explanation on how to use RPE scale       Expected Outcomes Short Term: Able to use RPE daily in rehab to express subjective intensity level;Long Term:  Able to use RPE to guide intensity level when exercising independently       Able to understand and use Dyspnea scale Yes       Intervention Provide education and explanation on how to use Dyspnea scale       Expected Outcomes Short Term: Able to use Dyspnea scale daily in rehab to express  subjective sense of shortness of breath during exertion;Long Term: Able to use Dyspnea scale to guide intensity level when exercising independently       Knowledge and understanding of Target Heart Rate Range (THRR) Yes       Intervention Provide education and explanation of THRR including how the numbers were predicted and where they are located for reference       Expected Outcomes Short Term: Able to state/look up THRR;Long Term: Able to use THRR to govern intensity when exercising independently;Short Term: Able to use daily as guideline for intensity in rehab       Able to check pulse independently Yes       Intervention Provide education and demonstration on how to check pulse in carotid and radial arteries.;Review the importance of being able to check your own pulse for safety during independent exercise       Expected Outcomes Short Term: Able to explain why pulse checking is important during independent exercise;Long Term: Able to check pulse independently and accurately       Understanding of Exercise Prescription Yes       Intervention Provide education, explanation, and written materials on patient's individual exercise prescription       Expected Outcomes Short Term: Able to explain program exercise prescription;Long Term: Able to explain home exercise prescription to exercise independently                Exercise Goals Re-Evaluation :   Discharge Exercise Prescription (Final Exercise Prescription Changes):  Exercise Prescription Changes - 02/22/22 1600       Response to Exercise   Blood Pressure (Admit) 98/56    Blood Pressure (Exercise) 122/58    Blood Pressure (Exit) 108/60    Heart Rate (Admit) 62 bpm    Heart Rate (Exercise) 105 bpm    Heart Rate (Exit) 64 bpm    Oxygen Saturation (Admit) 98 %    Oxygen Saturation (Exercise) 96 %    Oxygen Saturation (Exit) 96 %    Rating of Perceived Exertion (Exercise) 10    Perceived Dyspnea (Exercise) 0    Symptoms Right knee  pain 3/10    Comments walk test results             Nutrition:  Target Goals: Understanding of nutrition guidelines, daily intake of sodium 1500mg , cholesterol 200mg , calories 30% from fat and 7% or less from saturated fats, daily to have 5 or more servings of fruits and vegetables.  Education: All About Nutrition: -Group instruction provided by verbal, written material, interactive activities, discussions, models, and posters to present general guidelines for heart healthy nutrition including fat, fiber, MyPlate, the role of sodium in heart healthy nutrition, utilization of the nutrition label, and utilization of this knowledge for meal planning. Follow up email sent as well. Written material given at graduation. Flowsheet Row Cardiac Rehab from  02/22/2022 in [facility] Cardiac and Pulmonary Rehab  Education need identified 02/22/22       Biometrics:  Pre Biometrics - 02/22/22 1554       Pre Biometrics   Height 5' 5.75" (1.67 m)    Weight 164 lb 11.2 oz (74.7 kg)    BMI (Calculated) 26.79    Single Leg Stand 0 seconds              Nutrition Therapy Plan and Nutrition Goals:  Nutrition Therapy & Goals - 02/22/22 1557       Personal Nutrition Goals   Comments Wife would like to be present during RD appointment      Intervention Plan   Intervention Prescribe, educate and counsel regarding individualized specific dietary modifications aiming towards targeted core components such as weight, hypertension, lipid management, diabetes, heart failure and other comorbidities.    Expected Outcomes Short Term Goal: Understand basic principles of dietary content, such as calories, fat, sodium, cholesterol and nutrients.;Short Term Goal: A plan has been developed with personal nutrition goals set during dietitian appointment.;Long Term Goal: Adherence to prescribed nutrition plan.             Nutrition Assessments:  MEDIFICTS Score Key: ?70 Need to make dietary changes  40-70  Heart Healthy Diet ? 40 Therapeutic Level Cholesterol Diet  Flowsheet Row Cardiac Rehab from 02/22/2022 in [facility] Cardiac and Pulmonary Rehab  Picture Your Plate Total Score on Admission 62      Picture Your Plate Scores: [id number] Unhealthy dietary pattern with much room for improvement. 41-50 Dietary pattern unlikely to meet recommendations for good health and room for improvement. 51-60 More healthful dietary pattern, with some room for improvement.  >60 Healthy dietary pattern, although there may be some specific behaviors that could be improved.    Nutrition Goals Re-Evaluation:   Nutrition Goals Discharge (Final Nutrition Goals Re-Evaluation):   Psychosocial: Target Goals: Acknowledge presence or absence of significant depression and/or stress, maximize coping skills, provide positive support system. Participant is able to verbalize types and ability to use techniques and skills needed for reducing stress and depression.   Education: Stress, Anxiety, and Depression - Group verbal and visual presentation to define topics covered.  Reviews how body is impacted by stress, anxiety, and depression.  Also discusses healthy ways to reduce stress and to treat/manage anxiety and depression.  Written material given at graduation.   Education: Sleep Hygiene -Provides group verbal and written instruction about how sleep can affect your health.  Define sleep hygiene, discuss sleep cycles and impact of sleep habits. Review good sleep hygiene tips.    Initial Review & Psychosocial Screening:  Initial Psych Review & Screening - 02/12/22 1533       Initial Review   Current issues with None Identified      Family Dynamics   Good Support System? Yes   wife,     Barriers   Psychosocial barriers to participate in program There are no identifiable barriers or psychosocial needs.      Screening Interventions   Interventions Encouraged to exercise    Expected Outcomes Short Term goal: Utilizing  psychosocial counselor, staff and physician to assist with identification of specific Stressors or current issues interfering with healing process. Setting desired goal for each stressor or current issue identified.;Long Term Goal: Stressors or current issues are controlled or eliminated.;Short Term goal: Identification and review with participant of any Quality of Life or Depression concerns found by scoring the questionnaire.;Long Term goal: The  participant improves quality of Life and PHQ9 Scores as seen by post scores and/or verbalization of changes             Quality of Life Scores:   Quality of Life - 02/22/22 1556       Quality of Life   Select Quality of Life      Quality of Life Scores   Health/Function Pre 8.3 %    Socioeconomic Pre 25.19 %    Psych/Spiritual Pre 22.71 %    Family Pre 30 %    GLOBAL Pre 18.14 %            Scores of 19 and below usually indicate a poorer quality of life in these areas.  A difference of  2-3 points is a clinically meaningful difference.  A difference of 2-3 points in the total score of the Quality of Life Index has been associated with significant improvement in overall quality of life, self-image, physical symptoms, and general health in studies assessing change in quality of life.  PHQ-9: Review Flowsheet       02/22/2022  Depression screen PHQ 2/9  Decreased Interest 0  Down, Depressed, Hopeless 1  PHQ - 2 Score 1  Altered sleeping 2  Tired, decreased energy 2  Change in appetite 0  Feeling bad or failure about yourself  0  Trouble concentrating 0  Moving slowly or fidgety/restless 1  Suicidal thoughts 0  PHQ-9 Score 6  Difficult doing work/chores Not difficult at all   Interpretation of Total Score  Total Score Depression Severity:  1-4 = Minimal depression, 5-9 = Mild depression, 10-14 = Moderate depression, 15-19 = Moderately severe depression, 20-27 = Severe depression   Psychosocial Evaluation and Intervention:   Psychosocial Evaluation - 02/12/22 1554       Psychosocial Evaluation & Interventions   Comments Myran has no barriers to attending the program. He lives with his wife and she is his support. He wants to get healthy again and return to his ability to mow yard s and weed the garden. 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 The threshold has been decreased to about 150 beats as of last month when he had several episodes of VTach. 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 He has had several falls, from the neuropathy and from dizzy spells.  He understands that we will not have him on the treadmill to start. He is ready to get started    Expected Outcomes STG Brodin attends all scheduled sessions, he is able to progress with his exercise to return to his lawn chores without too much effort. LTG Keyondre continues to progress with his day to day activity.    Continue Psychosocial Services  Follow up required by staff             Psychosocial Re-Evaluation:   Psychosocial Discharge (Final Psychosocial Re-Evaluation):   Vocational Rehabilitation: Provide vocational rehab assistance to qualifying candidates.   Vocational Rehab Evaluation & Intervention:   Education: Education Goals: Education classes will be provided on a variety of topics geared toward better understanding of heart health and risk factor modification. Participant will state understanding/return demonstration of topics presented as noted by education test scores.  Learning Barriers/Preferences:   General Cardiac Education Topics:  AED/CPR: - Group verbal and written instruction with the use of models to demonstrate the basic use of the AED with the basic ABC's of resuscitation.   Anatomy  and Cardiac Procedures: - Group verbal and visual presentation and models provide information about basic cardiac anatomy and function. Reviews the testing methods done to diagnose heart disease and  the outcomes of the test results. Describes the treatment choices: Medical Management, Angioplasty, or Coronary Bypass Surgery for treating various heart conditions including Myocardial Infarction, Angina, Valve Disease, and Cardiac Arrhythmias.  Written material given at graduation. Flowsheet Row Cardiac Rehab from 02/22/2022 in [facility] Cardiac and Pulmonary Rehab  Education need identified 02/22/22       Medication Safety: - Group verbal and visual instruction to review commonly prescribed medications for heart and lung disease. Reviews the medication, class of the drug, and side effects. Includes the steps to properly store meds and maintain the prescription regimen.  Written material given at graduation.   Intimacy: - Group verbal instruction through game format to discuss how heart and lung disease can affect sexual intimacy. Written material given at graduation..   Know Your Numbers and Heart Failure: - Group verbal and visual instruction to discuss disease risk factors for cardiac and pulmonary disease and treatment options.  Reviews associated critical values for Overweight/Obesity, Hypertension, Cholesterol, and Diabetes.  Discusses basics of heart failure: signs/symptoms and treatments.  Introduces Heart Failure Zone chart for action plan for heart failure.  Written material given at graduation. Flowsheet Row Cardiac Rehab from 02/22/2022 in [facility] Cardiac and Pulmonary Rehab  Education need identified 02/22/22       Infection Prevention: - Provides verbal and written material to individual with discussion of infection control including proper hand washing and proper equipment cleaning during exercise session. Flowsheet Row Cardiac Rehab from 02/22/2022 in [facility] Cardiac and Pulmonary Rehab  Education need identified 02/22/22  Date 02/22/22  Educator [place]  Instruction Review Code 1- Verbalizes Understanding       Falls Prevention: - Provides verbal and written material to individual  with discussion of falls prevention and safety. Flowsheet Row Cardiac Rehab from 02/12/2022 in [facility] Cardiac and Pulmonary Rehab  Date 02/12/22  Educator [place]  Instruction Review Code 1- Verbalizes Understanding       Other: -Provides group and verbal instruction on various topics (see comments)   Knowledge Questionnaire Score:  Knowledge Questionnaire Score - 02/22/22 1556       Knowledge Questionnaire Score   Pre Score 20/26             Core Components/Risk Factors/Patient Goals at Admission:  Personal Goals and Risk Factors at Admission - 02/22/22 1635       Core Components/Risk Factors/Patient Goals on Admission    Weight Management Yes;Weight Maintenance    Intervention Weight Management: Develop a combined nutrition and exercise program designed to reach desired caloric intake, while maintaining appropriate intake of nutrient and fiber, sodium and fats, and appropriate energy expenditure required for the weight goal.;Weight Management: Provide education and appropriate resources to help participant work on and attain dietary goals.;Weight Management/Obesity: Establish reasonable short term and long term weight goals.    Admit Weight 164 lb (74.4 kg)    Goal Weight: Short Term 164 lb (74.4 kg)    Goal Weight: Long Term 164 lb (74.4 kg)    Expected Outcomes Short Term: Continue to assess and modify interventions until short term weight is achieved;Long Term: Adherence to nutrition and physical activity/exercise program aimed toward attainment of established weight goal;Weight Maintenance: Understanding of the daily nutrition guidelines, which includes 25-35% calories from fat, 7% or less cal from saturated fats, less than 200mg  cholesterol, less  than 1.5gm of sodium, & 5 or more servings of fruits and vegetables daily;Understanding recommendations for meals to include 15-35% energy as protein, 25-35% energy from fat, 35-60% energy from carbohydrates, less than 200mg  of dietary  cholesterol, 20-35 gm of total fiber daily;Understanding of distribution of calorie intake throughout the day with the consumption of 4-5 meals/snacks    Heart Failure Yes    Intervention Provide a combined exercise and nutrition program that is supplemented with education, support and counseling about heart failure. Directed toward relieving symptoms such as shortness of breath, decreased exercise tolerance, and extremity edema.    Expected Outcomes Improve functional capacity of life;Short term: Attendance in program 2-3 days a week with increased exercise capacity. Reported lower sodium intake. Reported increased fruit and vegetable intake. Reports medication compliance.;Short term: Daily weights obtained and reported for increase. Utilizing diuretic protocols set by physician.;Long term: Adoption of self-care skills and reduction of barriers for early signs and symptoms recognition and intervention leading to self-care maintenance.    Hypertension Yes    Intervention Provide education on lifestyle modifcations including regular physical activity/exercise, weight management, moderate sodium restriction and increased consumption of fresh fruit, vegetables, and low fat dairy, alcohol moderation, and smoking cessation.;Monitor prescription use compliance.    Expected Outcomes Short Term: Continued assessment and intervention until BP is < 140/58mm HG in hypertensive participants. < 130/71mm HG in hypertensive participants with diabetes, heart failure or chronic kidney disease.;Long Term: Maintenance of blood pressure at goal levels.    Lipids Yes    Intervention Provide education and support for participant on nutrition & aerobic/resistive exercise along with prescribed medications to achieve LDL 70mg , HDL >40mg .    Expected Outcomes Short Term: Participant states understanding of desired cholesterol values and is compliant with medications prescribed. Participant is following exercise prescription and  nutrition guidelines.;Long Term: Cholesterol controlled with medications as prescribed, with individualized exercise RX and with personalized nutrition plan. Value goals: LDL < 70mg , HDL > 40 mg.             Education:Diabetes - Individual verbal and written instruction to review signs/symptoms of diabetes, desired ranges of glucose level fasting, after meals and with exercise. Acknowledge that pre and post exercise glucose checks will be done for 3 sessions at entry of program.   Core Components/Risk Factors/Patient Goals Review:    Core Components/Risk Factors/Patient Goals at Discharge (Final Review):    ITP Comments:  ITP Comments     Row Name 02/12/22 1600 02/22/22 1554         ITP Comments Virtual orientation call completed today. he has an appointment on Date: 02/22/2022  for EP eval and gym Orientation.  Documentation of diagnosis can be found in [facility] records Date: 02/22/2022 . Completed 6MWT and gym orientation. Initial ITP created and sent for review to Dr. Emily Filbert, Medical Director.               Comments: Initial ITP

## 2022-02-26 DIAGNOSIS — I5022 Chronic systolic (congestive) heart failure: Secondary | ICD-10-CM

## 2022-02-26 DIAGNOSIS — Z5189 Encounter for other specified aftercare: Secondary | ICD-10-CM | POA: Diagnosis not present

## 2022-02-26 NOTE — Progress Notes (Signed)
Daily Session Note  Patient Details  Name: Danny Marquez MRN: [id number] Date of Birth: 1946/01/03 Referring Provider:   Flowsheet Row Cardiac Rehab from 02/22/2022 in [facility]  Referring Provider Garth Schlatter MD       Encounter Date: 02/26/2022  Check In:  Session Check In - 02/26/22 1404       Check-In   Supervising physician immediately available to respond to emergencies See telemetry face sheet for immediately available ER MD    Location [facility]-Cardiac & Pulmonary Rehab    Staff Present Birdie Sons, MPA, RN;Joseph [place], RCP,RRT,BSRT;Clint Biello Amedeo Plenty, BS, ACSM CEP, Exercise Physiologist    Virtual Visit No    Medication changes reported     No    Fall or balance concerns reported    No    Warm-up and Cool-down Performed on first and last piece of equipment    Resistance Training Performed Yes    VAD Patient? No    PAD/SET Patient? No      Pain Assessment   Currently in Pain? No/denies                Social History   Tobacco Use  Smoking Status Former   Years: 15.00   Types: Cigarettes  Smokeless Tobacco Never    Goals Met:  Independence with exercise equipment Exercise tolerated well No report of concerns or symptoms today Strength training completed today  Goals Unmet:  Not Applicable  Comments: First full day of exercise!  Patient was oriented to gym and equipment including functions, settings, policies, and procedures.  Patient's individual exercise prescription and treatment plan were reviewed.  All starting workloads were established based on the results of the 6 minute walk test done at initial orientation visit.  The plan for exercise progression was also introduced and progression will be customized based on patient's performance and goals.    Dr. Emily Filbert is Medical Director for [place].  Dr. Ottie Glazier is Medical Director for [facility] Pul[facility].

## 2022-02-28 ENCOUNTER — Ambulatory Visit: Payer: No Typology Code available for payment source

## 2022-03-03 ENCOUNTER — Emergency Department: Payer: Non-veteran care

## 2022-03-03 ENCOUNTER — Other Ambulatory Visit: Payer: Self-pay

## 2022-03-03 ENCOUNTER — Emergency Department
Admission: EM | Admit: 2022-03-03 | Discharge: 2022-03-03 | Disposition: A | Discharge status: Home / Self Care | Payer: Non-veteran care | Attending: Emergency Medicine | Admitting: Emergency Medicine

## 2022-03-03 DIAGNOSIS — E860 Dehydration: Secondary | ICD-10-CM | POA: Insufficient documentation

## 2022-03-03 DIAGNOSIS — Y92002 Bathroom of unspecified non-institutional (private) residence single-family (private) house as the place of occurrence of the external cause: Secondary | ICD-10-CM | POA: Diagnosis not present

## 2022-03-03 DIAGNOSIS — R258 Other abnormal involuntary movements: Secondary | ICD-10-CM | POA: Insufficient documentation

## 2022-03-03 DIAGNOSIS — I509 Heart failure, unspecified: Secondary | ICD-10-CM | POA: Diagnosis not present

## 2022-03-03 DIAGNOSIS — W1811XA Fall from or off toilet without subsequent striking against object, initial encounter: Secondary | ICD-10-CM | POA: Diagnosis not present

## 2022-03-03 DIAGNOSIS — K5900 Constipation, unspecified: Secondary | ICD-10-CM | POA: Insufficient documentation

## 2022-03-03 DIAGNOSIS — F0390 Unspecified dementia without behavioral disturbance: Secondary | ICD-10-CM | POA: Insufficient documentation

## 2022-03-03 DIAGNOSIS — R42 Dizziness and giddiness: Secondary | ICD-10-CM | POA: Insufficient documentation

## 2022-03-03 DIAGNOSIS — W19XXXA Unspecified fall, initial encounter: Secondary | ICD-10-CM

## 2022-03-03 LAB — URINALYSIS, ROUTINE W REFLEX MICROSCOPIC
Bacteria, UA: NONE SEEN
Bilirubin Urine: NEGATIVE
Glucose, UA: >=500 mg/dL — AB
Hgb urine dipstick: NEGATIVE
Ketones, ur: 5 mg/dL — AB
Leukocytes,Ua: NEGATIVE
Nitrite: NEGATIVE
Protein, ur: NEGATIVE mg/dL
Specific Gravity, Urine: 1.02 (ref 1.005–1.030)
pH: 5 (ref 5.0–8.0)

## 2022-03-03 LAB — COMPREHENSIVE METABOLIC PANEL
ALT: 17 U/L (ref 0–44)
AST: 45 U/L — ABNORMAL HIGH (ref 15–41)
Albumin: 3.1 g/dL — ABNORMAL LOW (ref 3.5–5.0)
Alkaline Phosphatase: 85 U/L (ref 38–126)
Anion gap: 5 (ref 5–15)
BUN: 24 mg/dL — ABNORMAL HIGH (ref 8–23)
CO2: 24 mmol/L (ref 22–32)
Calcium: 9.8 mg/dL (ref 8.9–10.3)
Chloride: 110 mmol/L (ref 98–111)
Creatinine, Ser: 1.45 mg/dL — ABNORMAL HIGH (ref 0.61–1.24)
GFR, Estimated: 50 mL/min — ABNORMAL LOW (ref >60)
Glucose, Bld: 113 mg/dL — ABNORMAL HIGH (ref 70–99)
Potassium: 4.3 mmol/L (ref 3.5–5.1)
Sodium: 139 mmol/L (ref 135–145)
Total Bilirubin: 0.8 mg/dL (ref 0.3–1.2)
Total Protein: 6.1 g/dL — ABNORMAL LOW (ref 6.5–8.1)

## 2022-03-03 LAB — CBC WITH DIFFERENTIAL/PLATELET
Abs Immature Granulocytes: 0.25 10*3/uL — ABNORMAL HIGH (ref 0.00–0.07)
Basophils Absolute: 0 10*3/uL (ref 0.0–0.1)
Basophils Relative: 0 %
Eosinophils Absolute: 0 10*3/uL (ref 0.0–0.5)
Eosinophils Relative: 0 %
HCT: 38.9 % — ABNORMAL LOW (ref 39.0–52.0)
Hemoglobin: 12.4 g/dL — ABNORMAL LOW (ref 13.0–17.0)
Immature Granulocytes: 2 %
Lymphocytes Relative: 3 %
Lymphs Abs: 0.5 10*3/uL — ABNORMAL LOW (ref 0.7–4.0)
MCH: 29.4 pg (ref 26.0–34.0)
MCHC: 31.9 g/dL (ref 30.0–36.0)
MCV: 92.2 fL (ref 80.0–100.0)
Monocytes Absolute: 0.7 10*3/uL (ref 0.1–1.0)
Monocytes Relative: 4 %
Neutro Abs: 15.7 10*3/uL — ABNORMAL HIGH (ref 1.7–7.7)
Neutrophils Relative %: 91 %
Platelets: 205 10*3/uL (ref 150–400)
RBC: 4.22 MIL/uL (ref 4.22–5.81)
RDW: 16.9 % — ABNORMAL HIGH (ref 11.5–15.5)
WBC: 17.2 10*3/uL — ABNORMAL HIGH (ref 4.0–10.5)
nRBC: 0 % (ref 0.0–0.2)

## 2022-03-03 LAB — BRAIN NATRIURETIC PEPTIDE: B Natriuretic Peptide: 279.7 pg/mL — ABNORMAL HIGH (ref 0.0–100.0)

## 2022-03-03 LAB — TROPONIN I (HIGH SENSITIVITY)
Troponin I (High Sensitivity): 21 ng/L — ABNORMAL HIGH (ref <18)
Troponin I (High Sensitivity): 22 ng/L — ABNORMAL HIGH (ref <18)

## 2022-03-03 MED ORDER — LACTATED RINGERS IV BOLUS
1000.0000 mL | Freq: Once | INTRAVENOUS | Status: AC
  Administered 2022-03-03: 1000 mL via INTRAVENOUS

## 2022-03-03 MED ORDER — IOHEXOL 350 MG/ML SOLN
75.0000 mL | Freq: Once | INTRAVENOUS | Status: AC | PRN
  Administered 2022-03-03: 75 mL via INTRAVENOUS

## 2022-03-03 NOTE — ED Triage Notes (Addendum)
Pt arrives by [facility] from home for a witnessed fall/syncope in the bathroom tonight. Pt's wife reports walking behind him to the bathroom when he started shaking, became generally weak, and went to the ground assisted by his wife.

## 2022-03-05 ENCOUNTER — Ambulatory Visit: Payer: No Typology Code available for payment source

## 2022-03-07 ENCOUNTER — Ambulatory Visit: Payer: No Typology Code available for payment source

## 2022-03-14 ENCOUNTER — Ambulatory Visit: Payer: No Typology Code available for payment source

## 2022-03-15 ENCOUNTER — Ambulatory Visit: Payer: No Typology Code available for payment source

## 2022-03-19 DIAGNOSIS — Z9889 Other specified postprocedural states: Secondary | ICD-10-CM

## 2022-03-19 HISTORY — DX: Other specified postprocedural states: Z98.890

## 2022-03-19 HISTORY — PX: OTHER SURGICAL HISTORY: SHX169

## 2022-03-20 DIAGNOSIS — I5022 Chronic systolic (congestive) heart failure: Secondary | ICD-10-CM

## 2022-03-20 NOTE — Progress Notes (Signed)
Danny Marquez has not been to rehab since 02/26/22 which was his 1st day of exercise.

## 2022-03-21 ENCOUNTER — Encounter: Payer: Self-pay | Admitting: *Deleted

## 2022-03-21 DIAGNOSIS — I5022 Chronic systolic (congestive) heart failure: Secondary | ICD-10-CM

## 2022-03-21 NOTE — Progress Notes (Signed)
Cardiac Individual Treatment Plan  Patient Details  Name: Danny Marquez MRN: [id number] Date of Birth: 11-Mar-1946 Referring Provider:   Flowsheet Row Cardiac Rehab from 02/22/2022 in [facility] Cardiac and Pulmonary Rehab  Referring Provider Garth Schlatter MD       Initial Encounter Date:  Flowsheet Row Cardiac Rehab from 02/22/2022 in [facility] Cardiac and Pulmonary Rehab  Date 02/22/22       Visit Diagnosis: Heart failure, chronic systolic ([place])  Patient's Home Medications on Admission:  Current Outpatient Medications:    allopurinol (ZYLOPRIM) 100 MG tablet, Take 200 mg by mouth daily., Disp: , Rfl:    amiodarone (PACERONE) 200 MG tablet, Take 100 mg by mouth 2 (two) times daily., Disp: , Rfl:    apixaban (ELIQUIS) 5 MG TABS tablet, Take 5 mg by mouth 2 (two) times daily., Disp: , Rfl:    apixaban (ELIQUIS) 5 MG TABS tablet, Take 5 mg by mouth 2 (two) times daily., Disp: , Rfl:    cefdinir (OMNICEF) 300 MG capsule, Take 1 capsule (300 mg total) by mouth 2 (two) times daily., Disp: 20 capsule, Rfl: 0   DONEPEZIL HCL PO, Take 10 mg by mouth., Disp: , Rfl:    empagliflozin (JARDIANCE) 25 MG TABS tablet, Take 12.5 mg by mouth daily., Disp: , Rfl:    HYDROcodone-acetaminophen (NORCO/VICODIN) 5-325 MG tablet, Take 1 tablet by mouth every 6 (six) hours as needed., Disp: 10 tablet, Rfl: 0   pregabalin (LYRICA) 50 MG capsule, Take 50 mg in the morning, 50 mg in the afternoon, and 100 mg at night, Disp: , Rfl:    ranolazine (RANEXA) 500 MG 12 hr tablet, Take 500 mg by mouth 2 (two) times daily., Disp: , Rfl:    rosuvastatin (CRESTOR) 5 MG tablet, , Disp: , Rfl:    sertraline (ZOLOFT) 100 MG tablet, Take 100 mg by mouth daily. , Disp: , Rfl:    spironolactone (ALDACTONE) 25 MG tablet, Take 25 mg by mouth daily., Disp: , Rfl:    torsemide (DEMADEX) 20 MG tablet, Take 20 mg by mouth daily., Disp: , Rfl:    TRAZODONE HCL PO, Take 75 mg by mouth., Disp: , Rfl:   Past Medical History: Past  Medical History:  Diagnosis Date   A-fib ([place])    CHF (congestive heart failure) (HCC)    EF 30%   Chronic kidney disease    congenital one kidney   Hypertension    Myocardial infarction ([place])    Solitary kidney     Tobacco Use: Social History   Tobacco Use  Smoking Status Former   Years: 15.00   Types: Cigarettes  Smokeless Tobacco Never    Labs: Review Flowsheet        No data to display           Exercise Target Goals: Exercise Program Goal: Individual exercise prescription set using results from initial 6 min walk test and THRR while considering  patient's activity barriers and safety.   Exercise Prescription Goal: Initial exercise prescription builds to 30-45 minutes a day of aerobic activity, 2-3 days per week.  Home exercise guidelines will be given to patient during program as part of exercise prescription that the participant will acknowledge.   Education: Aerobic Exercise: - Group verbal and visual presentation on the components of exercise prescription. Introduces F.I.T.T principle from ACSM for exercise prescriptions.  Reviews F.I.T.T. principles of aerobic exercise including progression. Written material given at graduation. Flowsheet Row Cardiac Rehab from 02/22/2022 in [facility] Cardiac  and Pulmonary Rehab  Education need identified 02/22/22       Education: Resistance Exercise: - Group verbal and visual presentation on the components of exercise prescription. Introduces F.I.T.T principle from ACSM for exercise prescriptions  Reviews F.I.T.T. principles of resistance exercise including progression. Written material given at graduation.    Education: Exercise & Equipment Safety: - Individual verbal instruction and demonstration of equipment use and safety with use of the equipment. Flowsheet Row Cardiac Rehab from 02/22/2022 in [facility] Cardiac and Pulmonary Rehab  Education need identified 02/22/22  Date 02/22/22  Educator [place]  Instruction Review Code 1-  Verbalizes Understanding       Education: Exercise Physiology & General Exercise Guidelines: - Group verbal and written instruction with models to review the exercise physiology of the cardiovascular system and associated critical values. Provides general exercise guidelines with specific guidelines to those with heart or lung disease.    Education: Flexibility, Balance, Mind/Body Relaxation: - Group verbal and visual presentation with interactive activity on the components of exercise prescription. Introduces F.I.T.T principle from ACSM for exercise prescriptions. Reviews F.I.T.T. principles of flexibility and balance exercise training including progression. Also discusses the mind body connection.  Reviews various relaxation techniques to help reduce and manage stress (i.e. Deep breathing, progressive muscle relaxation, and visualization). Balance handout provided to take home. Written material given at graduation.   Activity Barriers & Risk Stratification:  Activity Barriers & Cardiac Risk Stratification - 02/22/22 1555       Activity Barriers & Cardiac Risk Stratification   Activity Barriers Balance Concerns;Other (comment);Back Problems;Deconditioning;Right Knee Replacement;Left Knee Replacement;Muscular Weakness;History of Falls;Joint Problems    Comments neuropathy, multiple back surgeries, dizziness    Cardiac Risk Stratification High             6 Minute Walk:  6 Minute Walk     Row Name 02/22/22 1609         6 Minute Walk   Phase Initial     Distance 1020 feet     Walk Time 6 minutes     # of Rest Breaks 0     MPH 1.93     METS 2.07     RPE 10     Perceived Dyspnea  0     VO2 Peak 7.27     Symptoms Yes (comment)     Comments Right knee pain 3/10     Resting HR 62 bpm     Resting BP 98/56     Resting Oxygen Saturation  98 %     Exercise Oxygen Saturation  during 6 min walk 96 %     Max Ex. HR 105 bpm     Max Ex. BP 122/58     2 Minute Post BP 108/60               Oxygen Initial Assessment:   Oxygen Re-Evaluation:   Oxygen Discharge (Final Oxygen Re-Evaluation):   Initial Exercise Prescription:  Initial Exercise Prescription - 02/22/22 1600       Date of Initial Exercise RX and Referring Provider   Date 02/22/22    Referring Provider Garth Schlatter MD      Oxygen   Maintain Oxygen Saturation 88% or higher      Recumbant Bike   Level 1    RPM 60    Watts 10    Minutes 15    METs 2      NuStep   Level 1    SPM 80  Minutes 15    METs 2      Biostep-RELP   Level 1    SPM 50    Minutes 15    METs 2      Track   Laps 14    Minutes 15    METs 1.7      Prescription Details   Frequency (times per week) 3    Duration Progress to 30 minutes of continuous aerobic without signs/symptoms of physical distress      Intensity   THRR 40-80% of Max Heartrate 94- 127    Ratings of Perceived Exertion 11-13    Perceived Dyspnea 0-4      Progression   Progression Continue to progress workloads to maintain intensity without signs/symptoms of physical distress.      Resistance Training   Training Prescription Yes    Weight 4 lb    Reps 10-15             Perform Capillary Blood Glucose checks as needed.  Exercise Prescription Changes:   Exercise Prescription Changes     Row Name 02/22/22 1600             Response to Exercise   Blood Pressure (Admit) 98/56       Blood Pressure (Exercise) 122/58       Blood Pressure (Exit) 108/60       Heart Rate (Admit) 62 bpm       Heart Rate (Exercise) 105 bpm       Heart Rate (Exit) 64 bpm       Oxygen Saturation (Admit) 98 %       Oxygen Saturation (Exercise) 96 %       Oxygen Saturation (Exit) 96 %       Rating of Perceived Exertion (Exercise) 10       Perceived Dyspnea (Exercise) 0       Symptoms Right knee pain 3/10       Comments walk test results                Exercise Comments:   Exercise Comments     Row Name 02/26/22 1404            Exercise Comments First full day of exercise!  Patient was oriented to gym and equipment including functions, settings, policies, and procedures.  Patient's individual exercise prescription and treatment plan were reviewed.  All starting workloads were established based on the results of the 6 minute walk test done at initial orientation visit.  The plan for exercise progression was also introduced and progression will be customized based on patient's performance and goals.                Exercise Goals and Review:   Exercise Goals     Row Name 02/22/22 1634             Exercise Goals   Increase Physical Activity Yes       Intervention Provide advice, education, support and counseling about physical activity/exercise needs.;Develop an individualized exercise prescription for aerobic and resistive training based on initial evaluation findings, risk stratification, comorbidities and participant's personal goals.       Expected Outcomes Short Term: Attend rehab on a regular basis to increase amount of physical activity.;Long Term: Add in home exercise to make exercise part of routine and to increase amount of physical activity.;Long Term: Exercising regularly at least 3-5 days a week.       Increase Strength and  Stamina Yes       Intervention Provide advice, education, support and counseling about physical activity/exercise needs.;Develop an individualized exercise prescription for aerobic and resistive training based on initial evaluation findings, risk stratification, comorbidities and participant's personal goals.       Expected Outcomes Short Term: Increase workloads from initial exercise prescription for resistance, speed, and METs.;Short Term: Perform resistance training exercises routinely during rehab and add in resistance training at home;Long Term: Improve cardiorespiratory fitness, muscular endurance and strength as measured by increased METs and functional capacity ( )        Able to understand and use rate of perceived exertion (RPE) scale Yes       Intervention Provide education and explanation on how to use RPE scale       Expected Outcomes Short Term: Able to use RPE daily in rehab to express subjective intensity level;Long Term:  Able to use RPE to guide intensity level when exercising independently       Able to understand and use Dyspnea scale Yes       Intervention Provide education and explanation on how to use Dyspnea scale       Expected Outcomes Short Term: Able to use Dyspnea scale daily in rehab to express subjective sense of shortness of breath during exertion;Long Term: Able to use Dyspnea scale to guide intensity level when exercising independently       Knowledge and understanding of Target Heart Rate Range (THRR) Yes       Intervention Provide education and explanation of THRR including how the numbers were predicted and where they are located for reference       Expected Outcomes Short Term: Able to state/look up THRR;Long Term: Able to use THRR to govern intensity when exercising independently;Short Term: Able to use daily as guideline for intensity in rehab       Able to check pulse independently Yes       Intervention Provide education and demonstration on how to check pulse in carotid and radial arteries.;Review the importance of being able to check your own pulse for safety during independent exercise       Expected Outcomes Short Term: Able to explain why pulse checking is important during independent exercise;Long Term: Able to check pulse independently and accurately       Understanding of Exercise Prescription Yes       Intervention Provide education, explanation, and written materials on patient's individual exercise prescription       Expected Outcomes Short Term: Able to explain program exercise prescription;Long Term: Able to explain home exercise prescription to exercise independently                Exercise Goals Re-Evaluation  :  Exercise Goals Re-Evaluation     Row Name 02/26/22 1404             Exercise Goal Re-Evaluation   Exercise Goals Review Increase Physical Activity;Able to understand and use rate of perceived exertion (RPE) scale;Understanding of Exercise Prescription;Knowledge and understanding of Target Heart Rate Range (THRR);Increase Strength and Stamina;Able to understand and use Dyspnea scale;Able to check pulse independently       Comments Reviewed RPE and dyspnea scales, THR and program prescription with pt today.  Pt voiced understanding and was given a copy of goals to take home.       Expected Outcomes Short: Use RPE daily to regulate intensity. Long: Follow program prescription in THR.  Discharge Exercise Prescription (Final Exercise Prescription Changes):  Exercise Prescription Changes - 02/22/22 1600       Response to Exercise   Blood Pressure (Admit) 98/56    Blood Pressure (Exercise) 122/58    Blood Pressure (Exit) 108/60    Heart Rate (Admit) 62 bpm    Heart Rate (Exercise) 105 bpm    Heart Rate (Exit) 64 bpm    Oxygen Saturation (Admit) 98 %    Oxygen Saturation (Exercise) 96 %    Oxygen Saturation (Exit) 96 %    Rating of Perceived Exertion (Exercise) 10    Perceived Dyspnea (Exercise) 0    Symptoms Right knee pain 3/10    Comments walk test results             Nutrition:  Target Goals: Understanding of nutrition guidelines, daily intake of sodium 1500mg , cholesterol 200mg , calories 30% from fat and 7% or less from saturated fats, daily to have 5 or more servings of fruits and vegetables.  Education: All About Nutrition: -Group instruction provided by verbal, written material, interactive activities, discussions, models, and posters to present general guidelines for heart healthy nutrition including fat, fiber, MyPlate, the role of sodium in heart healthy nutrition, utilization of the nutrition label, and utilization of this knowledge for meal  planning. Follow up email sent as well. Written material given at graduation. Flowsheet Row Cardiac Rehab from 02/22/2022 in [facility] Cardiac and Pulmonary Rehab  Education need identified 02/22/22       Biometrics:  Pre Biometrics - 02/22/22 1554       Pre Biometrics   Height 5' 5.75" (1.67 m)    Weight 164 lb 11.2 oz (74.7 kg)    BMI (Calculated) 26.79    Single Leg Stand 0 seconds              Nutrition Therapy Plan and Nutrition Goals:  Nutrition Therapy & Goals - 02/22/22 1557       Personal Nutrition Goals   Comments Wife would like to be present during RD appointment      Intervention Plan   Intervention Prescribe, educate and counsel regarding individualized specific dietary modifications aiming towards targeted core components such as weight, hypertension, lipid management, diabetes, heart failure and other comorbidities.    Expected Outcomes Short Term Goal: Understand basic principles of dietary content, such as calories, fat, sodium, cholesterol and nutrients.;Short Term Goal: A plan has been developed with personal nutrition goals set during dietitian appointment.;Long Term Goal: Adherence to prescribed nutrition plan.             Nutrition Assessments:  MEDIFICTS Score Key: ?70 Need to make dietary changes  40-70 Heart Healthy Diet ? 40 Therapeutic Level Cholesterol Diet  Flowsheet Row Cardiac Rehab from 02/22/2022 in [facility] Cardiac and Pulmonary Rehab  Picture Your Plate Total Score on Admission 62      Picture Your Plate Scores: <60 Unhealthy dietary pattern with much room for improvement. 41-50 Dietary pattern unlikely to meet recommendations for good health and room for improvement. 51-60 More healthful dietary pattern, with some room for improvement.  >60 Healthy dietary pattern, although there may be some specific behaviors that could be improved.    Nutrition Goals Re-Evaluation:   Nutrition Goals Discharge (Final Nutrition Goals  Re-Evaluation):   Psychosocial: Target Goals: Acknowledge presence or absence of significant depression and/or stress, maximize coping skills, provide positive support system. Participant is able to verbalize types and ability to use techniques and skills needed for reducing stress  and depression.   Education: Stress, Anxiety, and Depression - Group verbal and visual presentation to define topics covered.  Reviews how body is impacted by stress, anxiety, and depression.  Also discusses healthy ways to reduce stress and to treat/manage anxiety and depression.  Written material given at graduation.   Education: Sleep Hygiene -Provides group verbal and written instruction about how sleep can affect your health.  Define sleep hygiene, discuss sleep cycles and impact of sleep habits. Review good sleep hygiene tips.    Initial Review & Psychosocial Screening:  Initial Psych Review & Screening - 02/12/22 1533       Initial Review   Current issues with None Identified      Family Dynamics   Good Support System? Yes   wife,     Barriers   Psychosocial barriers to participate in program There are no identifiable barriers or psychosocial needs.      Screening Interventions   Interventions Encouraged to exercise    Expected Outcomes Short Term goal: Utilizing psychosocial counselor, staff and physician to assist with identification of specific Stressors or current issues interfering with healing process. Setting desired goal for each stressor or current issue identified.;Long Term Goal: Stressors or current issues are controlled or eliminated.;Short Term goal: Identification and review with participant of any Quality of Life or Depression concerns found by scoring the questionnaire.;Long Term goal: The participant improves quality of Life and PHQ9 Scores as seen by post scores and/or verbalization of changes             Quality of Life Scores:   Quality of Life - 02/22/22 1556        Quality of Life   Select Quality of Life      Quality of Life Scores   Health/Function Pre 8.3 %    Socioeconomic Pre 25.19 %    Psych/Spiritual Pre 22.71 %    Family Pre 30 %    GLOBAL Pre 18.14 %            Scores of 19 and below usually indicate a poorer quality of life in these areas.  A difference of  2-3 points is a clinically meaningful difference.  A difference of 2-3 points in the total score of the Quality of Life Index has been associated with significant improvement in overall quality of life, self-image, physical symptoms, and general health in studies assessing change in quality of life.  PHQ-9: Review Flowsheet       02/22/2022  Depression screen PHQ 2/9  Decreased Interest 0  Down, Depressed, Hopeless 1  PHQ - 2 Score 1  Altered sleeping 2  Tired, decreased energy 2  Change in appetite 0  Feeling bad or failure about yourself  0  Trouble concentrating 0  Moving slowly or fidgety/restless 1  Suicidal thoughts 0  PHQ-9 Score 6  Difficult doing work/chores Not difficult at all   Interpretation of Total Score  Total Score Depression Severity:  1-4 = Minimal depression, 5-9 = Mild depression, 10-14 = Moderate depression, 15-19 = Moderately severe depression, 20-27 = Severe depression   Psychosocial Evaluation and Intervention:  Psychosocial Evaluation - 02/12/22 1554       Psychosocial Evaluation & Interventions   Comments Jarmar has no barriers to attending the program. He lives with his wife and she is his support. He wants to get healthy again and return to his ability to mow yard s and weed the garden. He has an ICD and it has shocked  him a few times. The threshold has been decreased to about 150 beats as of last month when he had several episodes of VTach. 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 He has had several falls, from the neuropathy and from dizzy spells.  He understands that we will not have him on the treadmill to start.  He is ready to get started    Expected Outcomes STG Darrel attends all scheduled sessions, he is able to progress with his exercise to return to his lawn chores without too much effort. LTG Osiel continues to progress with his day to day activity.    Continue Psychosocial Services  Follow up required by staff             Psychosocial Re-Evaluation:   Psychosocial Discharge (Final Psychosocial Re-Evaluation):   Vocational Rehabilitation: Provide vocational rehab assistance to qualifying candidates.   Vocational Rehab Evaluation & Intervention:   Education: Education Goals: Education classes will be provided on a variety of topics geared toward better understanding of heart health and risk factor modification. Participant will state understanding/return demonstration of topics presented as noted by education test scores.  Learning Barriers/Preferences:   General Cardiac Education Topics:  AED/CPR: - Group verbal and written instruction with the use of models to demonstrate the basic use of the AED with the basic ABC's of resuscitation.   Anatomy and Cardiac Procedures: - Group verbal and visual presentation and models provide information about basic cardiac anatomy and function. Reviews the testing methods done to diagnose heart disease and the outcomes of the test results. Describes the treatment choices: Medical Management, Angioplasty, or Coronary Bypass Surgery for treating various heart conditions including Myocardial Infarction, Angina, Valve Disease, and Cardiac Arrhythmias.  Written material given at graduation. Flowsheet Row Cardiac Rehab from 02/22/2022 in [facility] Cardiac and Pulmonary Rehab  Education need identified 02/22/22       Medication Safety: - Group verbal and visual instruction to review commonly prescribed medications for heart and lung disease. Reviews the medication, class of the drug, and side effects. Includes the steps to properly store meds and  maintain the prescription regimen.  Written material given at graduation.   Intimacy: - Group verbal instruction through game format to discuss how heart and lung disease can affect sexual intimacy. Written material given at graduation..   Know Your Numbers and Heart Failure: - Group verbal and visual instruction to discuss disease risk factors for cardiac and pulmonary disease and treatment options.  Reviews associated critical values for Overweight/Obesity, Hypertension, Cholesterol, and Diabetes.  Discusses basics of heart failure: signs/symptoms and treatments.  Introduces Heart Failure Zone chart for action plan for heart failure.  Written material given at graduation. Flowsheet Row Cardiac Rehab from 02/22/2022 in [facility] Cardiac and Pulmonary Rehab  Education need identified 02/22/22       Infection Prevention: - Provides verbal and written material to individual with discussion of infection control including proper hand washing and proper equipment cleaning during exercise session. Flowsheet Row Cardiac Rehab from 02/22/2022 in [facility] Cardiac and Pulmonary Rehab  Education need identified 02/22/22  Date 02/22/22  Educator [place]  Instruction Review Code 1- Verbalizes Understanding       Falls Prevention: - Provides verbal and written material to individual with discussion of falls prevention and safety. Flowsheet Row Cardiac Rehab from 02/12/2022 in [facility] Cardiac and Pulmonary Rehab  Date 02/12/22  Educator SB  Instruction Review Code 1- Verbalizes Understanding       Other: -  Provides group and verbal instruction on various topics (see comments)   Knowledge Questionnaire Score:  Knowledge Questionnaire Score - 02/22/22 1556       Knowledge Questionnaire Score   Pre Score 20/26             Core Components/Risk Factors/Patient Goals at Admission:  Personal Goals and Risk Factors at Admission - 02/22/22 1635       Core Components/Risk Factors/Patient Goals on Admission     Weight Management Yes;Weight Maintenance    Intervention Weight Management: Develop a combined nutrition and exercise program designed to reach desired caloric intake, while maintaining appropriate intake of nutrient and fiber, sodium and fats, and appropriate energy expenditure required for the weight goal.;Weight Management: Provide education and appropriate resources to help participant work on and attain dietary goals.;Weight Management/Obesity: Establish reasonable short term and long term weight goals.    Admit Weight 164 lb (74.4 kg)    Goal Weight: Short Term 164 lb (74.4 kg)    Goal Weight: Long Term 164 lb (74.4 kg)    Expected Outcomes Short Term: Continue to assess and modify interventions until short term weight is achieved;Long Term: Adherence to nutrition and physical activity/exercise program aimed toward attainment of established weight goal;Weight Maintenance: Understanding of the daily nutrition guidelines, which includes 25-35% calories from fat, 7% or less cal from saturated fats, less than 200mg  cholesterol, less than 1.5gm of sodium, & 5 or more servings of fruits and vegetables daily;Understanding recommendations for meals to include 15-35% energy as protein, 25-35% energy from fat, 35-60% energy from carbohydrates, less than 200mg  of dietary cholesterol, 20-35 gm of total fiber daily;Understanding of distribution of calorie intake throughout the day with the consumption of 4-5 meals/snacks    Heart Failure Yes    Intervention Provide a combined exercise and nutrition program that is supplemented with education, support and counseling about heart failure. Directed toward relieving symptoms such as shortness of breath, decreased exercise tolerance, and extremity edema.    Expected Outcomes Improve functional capacity of life;Short term: Attendance in program 2-3 days a week with increased exercise capacity. Reported lower sodium intake. Reported increased fruit and vegetable  intake. Reports medication compliance.;Short term: Daily weights obtained and reported for increase. Utilizing diuretic protocols set by physician.;Long term: Adoption of self-care skills and reduction of barriers for early signs and symptoms recognition and intervention leading to self-care maintenance.    Hypertension Yes    Intervention Provide education on lifestyle modifcations including regular physical activity/exercise, weight management, moderate sodium restriction and increased consumption of fresh fruit, vegetables, and low fat dairy, alcohol moderation, and smoking cessation.;Monitor prescription use compliance.    Expected Outcomes Short Term: Continued assessment and intervention until BP is < 140/11mm HG in hypertensive participants. < 130/58mm HG in hypertensive participants with diabetes, heart failure or chronic kidney disease.;Long Term: Maintenance of blood pressure at goal levels.    Lipids Yes    Intervention Provide education and support for participant on nutrition & aerobic/resistive exercise along with prescribed medications to achieve LDL 70mg , HDL >40mg .    Expected Outcomes Short Term: Participant states understanding of desired cholesterol values and is compliant with medications prescribed. Participant is following exercise prescription and nutrition guidelines.;Long Term: Cholesterol controlled with medications as prescribed, with individualized exercise RX and with personalized nutrition plan. Value goals: LDL < 70mg , HDL > 40 mg.             Education:Diabetes - Individual verbal and written instruction to review signs/symptoms of  diabetes, desired ranges of glucose level fasting, after meals and with exercise. Acknowledge that pre and post exercise glucose checks will be done for 3 sessions at entry of program.   Core Components/Risk Factors/Patient Goals Review:    Core Components/Risk Factors/Patient Goals at Discharge (Final Review):    ITP Comments:   ITP Comments     Row Name 02/12/22 1600 02/22/22 1554 02/26/22 1404 03/20/22 1134 03/21/22 1035   ITP Comments Virtual orientation call completed today. he has an appointment on Date: 02/22/2022  for EP eval and gym Orientation.  Documentation of diagnosis can be found in [facility] records Date: 02/22/2022 . Completed 6MWT and gym orientation. Initial ITP created and sent for review to Dr. Bethann PunchesMark Miller, Medical Director. First full day of exercise!  Patient was oriented to gym and equipment including functions, settings, policies, and procedures.  Patient's individual exercise prescription and treatment plan were reviewed.  All starting workloads were established based on the results of the 6 minute walk test done at initial orientation visit.  The plan for exercise progression was also introduced and progression will be customized based on patient's performance and goals. Gerlene BurdockRichard has not been to rehab since 02/26/22 which was his 1st day of exercise. 30 Day review completed. Medical Director ITP review done, changes made as directed, and signed approval by Medical Director.   New out on medical leave            Comments:

## 2022-04-02 ENCOUNTER — Encounter: Payer: No Typology Code available for payment source | Attending: Internal Medicine | Admitting: *Deleted

## 2022-04-02 DIAGNOSIS — I5022 Chronic systolic (congestive) heart failure: Secondary | ICD-10-CM | POA: Diagnosis not present

## 2022-04-02 NOTE — Progress Notes (Signed)
Daily Session Note  Patient Details  Name: Danny Marquez MRN: [id number] Date of Birth: October 30, 1945 Referring Provider:   Flowsheet Row Cardiac Rehab from 02/22/2022 in [facility] Cardiac and Pulmonary Rehab  Referring Provider Garth Schlatter MD       Encounter Date: 04/02/2022  Check In:  Session Check In - 04/02/22 1410       Check-In   Supervising physician immediately available to respond to emergencies See telemetry face sheet for immediately available ER MD    Location [facility]-Cardiac & Pulmonary Rehab    Staff Present Nyoka Cowden, RN, BSN, MA;Meredith Sherryll Burger, RN BSN;Melissa Tilford Pillar, RDN, LDN    Virtual Visit No    Medication changes reported     No    Fall or balance concerns reported    No    Tobacco Cessation No Change    Warm-up and Cool-down Performed on first and last piece of equipment    Resistance Training Performed Yes    VAD Patient? No    PAD/SET Patient? No      Pain Assessment   Currently in Pain? No/denies                Social History   Tobacco Use  Smoking Status Former   Years: 15.00   Types: Cigarettes  Smokeless Tobacco Never    Goals Met:  Independence with exercise equipment Exercise tolerated well No report of concerns or symptoms today  Goals Unmet:  Not Applicable  Comments: Pt able to follow exercise prescription today without complaint.  Will continue to monitor for progression.    Dr. Emily Filbert is Medical Director for [place].  Dr. Ottie Glazier is Medical Director for [facility] Pulmonary Rehabilitation.

## 2022-04-04 ENCOUNTER — Encounter: Payer: No Typology Code available for payment source | Admitting: *Deleted

## 2022-04-04 DIAGNOSIS — I5022 Chronic systolic (congestive) heart failure: Secondary | ICD-10-CM | POA: Diagnosis not present

## 2022-04-04 NOTE — Progress Notes (Signed)
Daily Session Note  Patient Details  Name: Danny Marquez MRN: [id number] Date of Birth: Jan 21, 1946 Referring Provider:   Flowsheet Row Cardiac Rehab from 02/22/2022 in [facility] Cardiac and Pulmonary Rehab  Referring Provider Garth Schlatter MD       Encounter Date: 04/04/2022  Check In:  Session Check In - 04/04/22 1419       Check-In   Supervising physician immediately available to respond to emergencies See telemetry face sheet for immediately available ER MD    Location [facility]-Cardiac & Pulmonary Rehab    Staff Present Nyoka Cowden, RN, BSN, Ardeth Sportsman, RDN, LDN;Meredith Sherryll Burger, RN BSN    Virtual Visit No    Medication changes reported     No    Fall or balance concerns reported    No    Tobacco Cessation No Change    Warm-up and Cool-down Performed on first and last piece of equipment    Resistance Training Performed Yes    VAD Patient? No    PAD/SET Patient? No      Pain Assessment   Currently in Pain? No/denies                Social History   Tobacco Use  Smoking Status Former   Years: 15.00   Types: Cigarettes  Smokeless Tobacco Never    Goals Met:  Independence with exercise equipment Exercise tolerated well No report of concerns or symptoms today  Goals Unmet:  Not Applicable  Comments:Pt able to follow exercise prescription today without complaint.  Will continue to monitor for progression.    Dr. Emily Filbert is Medical Director for [place].  Dr. Ottie Glazier is Medical Director for [facility] Pulmonary Rehabilitation.

## 2022-04-09 ENCOUNTER — Encounter: Payer: No Typology Code available for payment source | Admitting: *Deleted

## 2022-04-09 DIAGNOSIS — I5022 Chronic systolic (congestive) heart failure: Secondary | ICD-10-CM | POA: Diagnosis not present

## 2022-04-09 NOTE — Progress Notes (Signed)
Daily Session Note  Patient Details  Name: Danny Marquez MRN: [id number] Date of Birth: 04/01/1946 Referring Provider:   Flowsheet Row Cardiac Rehab from 02/22/2022 in [facility] Cardiac and Pulmonary Rehab  Referring Provider Garth Schlatter MD       Encounter Date: 04/09/2022  Check In:  Session Check In - 04/09/22 1416       Check-In   Supervising physician immediately available to respond to emergencies See telemetry face sheet for immediately available ER MD    Location [facility]-Cardiac & Pulmonary Rehab    Staff Present Nyoka Cowden, RN, BSN, MA;Meredith Sherryll Burger, RN Margurite Auerbach, MS, ASCM CEP, Exercise Physiologist    Virtual Visit No    Medication changes reported     No    Fall or balance concerns reported    No    Tobacco Cessation No Change    Warm-up and Cool-down Performed on first and last piece of equipment    Resistance Training Performed Yes    VAD Patient? No      Pain Assessment   Currently in Pain? No/denies                Social History   Tobacco Use  Smoking Status Former   Years: 15.00   Types: Cigarettes  Smokeless Tobacco Never    Goals Met:  Independence with exercise equipment Exercise tolerated well No report of concerns or symptoms today  Goals Unmet:  Not Applicable  Comments: Pt able to follow exercise prescription today without complaint.  Will continue to monitor for progression.    Dr. Emily Filbert is Medical Director for [place].  Dr. Ottie Glazier is Medical Director for [facility] Pulmonary Rehabilitation.

## 2022-04-11 ENCOUNTER — Encounter: Payer: No Typology Code available for payment source | Attending: Internal Medicine | Admitting: *Deleted

## 2022-04-11 DIAGNOSIS — Z5189 Encounter for other specified aftercare: Secondary | ICD-10-CM | POA: Diagnosis not present

## 2022-04-11 DIAGNOSIS — I5022 Chronic systolic (congestive) heart failure: Secondary | ICD-10-CM | POA: Insufficient documentation

## 2022-04-11 NOTE — Progress Notes (Signed)
  PODaily Session Note  Patient Details  Name: Danny Marquez MRN: [id number] Date of Birth: 1945/11/18 Referring Provider:   Flowsheet Row Cardiac Rehab from 02/22/2022 in [facility] Cardiac and Pulmonary Rehab  Referring Provider Garth Schlatter MD       Encounter Date: 04/11/2022  Check In:  Session Check In - 04/11/22 1407       Check-In   Supervising physician immediately available to respond to emergencies See telemetry face sheet for immediately available ER MD    Location [facility]-Cardiac & Pulmonary Rehab    Staff Present Nyoka Cowden, RN, BSN, MA;Meredith Sherryll Burger, RN BSN    Virtual Visit No    Medication changes reported     No    Fall or balance concerns reported    No    Tobacco Cessation No Change    Warm-up and Cool-down Performed on first and last piece of equipment    Resistance Training Performed Yes    VAD Patient? No    PAD/SET Patient? No      Pain Assessment   Currently in Pain? No/denies            Pt began having monofocal PVCs/VTach in long runs after approximately 10 minutes on Recumbent Bike. This was his first time on the bike and he "was pushing himself". Asymptomatic and denied any s/s. Rhythm returned to his "normal" while changing machines to T4. Vtach resumed in shorter and less frequent bursts. Remained asymptomatic and VSS. During resistance and cool down segments, no VTach/PVCs noted. Pt's wife was in waiting room and had pt's [place] [facility] team contact information. Called  [phone] and left voicemail with Dr Scharlene Corn Nurse Practitioner, [place] re: events and pt's home phone number. Called EP lab at [facility] and unable to reach [employer]. Called Dr Tammi Klippel at [phone] and left message with information and pt callback number. Dr Tammi Klippel returned call shortly and he agreed with interventions and plan of care. He will speak with EP lab. Mrs Farnan planned to go home and synch pacemaker and transmit for review.  Called patient's home phone number and left message re: phone call with Dr Tammi Klippel.     Social History   Tobacco Use  Smoking Status Former   Years: 15.00   Types: Cigarettes  Smokeless Tobacco Never    Goals Met:  Independence with exercise equipment Exercise tolerated well No report of concerns or symptoms today  Goals Unmet:  Not Applicable  Comments: Pt able to follow exercise prescription today without complaint.  Will continue to monitor for progression.    Dr. Emily Filbert is Medical Director for [place].  Dr. Ottie Glazier is Medical Director for [facility] Pulmonary Rehabilitation.

## 2022-04-16 ENCOUNTER — Encounter: Payer: No Typology Code available for payment source | Admitting: *Deleted

## 2022-04-16 DIAGNOSIS — I5022 Chronic systolic (congestive) heart failure: Secondary | ICD-10-CM | POA: Diagnosis not present

## 2022-04-16 NOTE — Progress Notes (Signed)
Daily Session Note  Patient Details  Name: Bran Aldridge MRN: [id number] Date of Birth: [birth year] Referring Provider:   Flowsheet Row Cardiac Rehab from 02/22/2022 in [facility] Cardiac and Pulmonary Rehab  Referring Provider Garth Schlatter MD       Encounter Date: 04/16/2022  Check In:  Session Check In - 04/16/22 1430       Check-In   Supervising physician immediately available to respond to emergencies See telemetry face sheet for immediately available ER MD    Location [facility]-Cardiac & Pulmonary Rehab    Staff Present Nyoka Cowden, RN, BSN, [place], BS, RRT, CPFT;Melissa Caiola, RDN, LDN    Virtual Visit No    Medication changes reported     No    Fall or balance concerns reported    No    Tobacco Cessation No Change    Warm-up and Cool-down Performed on first and last piece of equipment    Resistance Training Performed Yes    VAD Patient? No    PAD/SET Patient? No                Social History   Tobacco Use  Smoking Status Former   Years: 15.00   Types: Cigarettes  Smokeless Tobacco Never    Goals Met:  Independence with exercise equipment Exercise tolerated well No report of concerns or symptoms today  Goals Unmet:  Not Applicable  Comments: Pt able to follow exercise prescription today without complaint.  Will continue to monitor for progression.    Dr. Emily Filbert is Medical Director for [place].  Dr. Ottie Glazier is Medical Director for [facility] Pulmonary Rehabilitation.

## 2022-04-18 ENCOUNTER — Encounter: Payer: Self-pay | Admitting: *Deleted

## 2022-04-18 DIAGNOSIS — I5022 Chronic systolic (congestive) heart failure: Secondary | ICD-10-CM | POA: Diagnosis not present

## 2022-04-18 NOTE — Progress Notes (Signed)
Cardiac Individual Treatment Plan  Patient Details  Name: Danny Marquez MRN: [id number] Date of Birth: 11-Mar-1946 Referring Provider:   Flowsheet Row Cardiac Rehab from 02/22/2022 in [facility] Cardiac and Pulmonary Rehab  Referring Provider Garth Schlatter MD       Initial Encounter Date:  Flowsheet Row Cardiac Rehab from 02/22/2022 in [facility] Cardiac and Pulmonary Rehab  Date 02/22/22       Visit Diagnosis: Heart failure, chronic systolic ([place])  Patient's Home Medications on Admission:  Current Outpatient Medications:    allopurinol (ZYLOPRIM) 100 MG tablet, Take 200 mg by mouth daily., Disp: , Rfl:    amiodarone (PACERONE) 200 MG tablet, Take 100 mg by mouth 2 (two) times daily., Disp: , Rfl:    apixaban (ELIQUIS) 5 MG TABS tablet, Take 5 mg by mouth 2 (two) times daily., Disp: , Rfl:    apixaban (ELIQUIS) 5 MG TABS tablet, Take 5 mg by mouth 2 (two) times daily., Disp: , Rfl:    cefdinir (OMNICEF) 300 MG capsule, Take 1 capsule (300 mg total) by mouth 2 (two) times daily., Disp: 20 capsule, Rfl: 0   DONEPEZIL HCL PO, Take 10 mg by mouth., Disp: , Rfl:    empagliflozin (JARDIANCE) 25 MG TABS tablet, Take 12.5 mg by mouth daily., Disp: , Rfl:    HYDROcodone-acetaminophen (NORCO/VICODIN) 5-325 MG tablet, Take 1 tablet by mouth every 6 (six) hours as needed., Disp: 10 tablet, Rfl: 0   pregabalin (LYRICA) 50 MG capsule, Take 50 mg in the morning, 50 mg in the afternoon, and 100 mg at night, Disp: , Rfl:    ranolazine (RANEXA) 500 MG 12 hr tablet, Take 500 mg by mouth 2 (two) times daily., Disp: , Rfl:    rosuvastatin (CRESTOR) 5 MG tablet, , Disp: , Rfl:    sertraline (ZOLOFT) 100 MG tablet, Take 100 mg by mouth daily. , Disp: , Rfl:    spironolactone (ALDACTONE) 25 MG tablet, Take 25 mg by mouth daily., Disp: , Rfl:    torsemide (DEMADEX) 20 MG tablet, Take 20 mg by mouth daily., Disp: , Rfl:    TRAZODONE HCL PO, Take 75 mg by mouth., Disp: , Rfl:   Past Medical History: Past  Medical History:  Diagnosis Date   A-fib ([place])    CHF (congestive heart failure) (HCC)    EF 30%   Chronic kidney disease    congenital one kidney   Hypertension    Myocardial infarction ([place])    Solitary kidney     Tobacco Use: Social History   Tobacco Use  Smoking Status Former   Years: 15.00   Types: Cigarettes  Smokeless Tobacco Never    Labs: Review Flowsheet        No data to display           Exercise Target Goals: Exercise Program Goal: Individual exercise prescription set using results from initial 6 min walk test and THRR while considering  patient's activity barriers and safety.   Exercise Prescription Goal: Initial exercise prescription builds to 30-45 minutes a day of aerobic activity, 2-3 days per week.  Home exercise guidelines will be given to patient during program as part of exercise prescription that the participant will acknowledge.   Education: Aerobic Exercise: - Group verbal and visual presentation on the components of exercise prescription. Introduces F.I.T.T principle from ACSM for exercise prescriptions.  Reviews F.I.T.T. principles of aerobic exercise including progression. Written material given at graduation. Flowsheet Row Cardiac Rehab from 02/22/2022 in [facility] Cardiac  and Pulmonary Rehab  Education need identified 02/22/22       Education: Resistance Exercise: - Group verbal and visual presentation on the components of exercise prescription. Introduces F.I.T.T principle from ACSM for exercise prescriptions  Reviews F.I.T.T. principles of resistance exercise including progression. Written material given at graduation.    Education: Exercise & Equipment Safety: - Individual verbal instruction and demonstration of equipment use and safety with use of the equipment. Flowsheet Row Cardiac Rehab from 02/22/2022 in [facility] Cardiac and Pulmonary Rehab  Education need identified 02/22/22  Date 02/22/22  Educator [place]  Instruction Review Code 1-  Verbalizes Understanding       Education: Exercise Physiology & General Exercise Guidelines: - Group verbal and written instruction with models to review the exercise physiology of the cardiovascular system and associated critical values. Provides general exercise guidelines with specific guidelines to those with heart or lung disease.    Education: Flexibility, Balance, Mind/Body Relaxation: - Group verbal and visual presentation with interactive activity on the components of exercise prescription. Introduces F.I.T.T principle from ACSM for exercise prescriptions. Reviews F.I.T.T. principles of flexibility and balance exercise training including progression. Also discusses the mind body connection.  Reviews various relaxation techniques to help reduce and manage stress (i.e. Deep breathing, progressive muscle relaxation, and visualization). Balance handout provided to take home. Written material given at graduation.   Activity Barriers & Risk Stratification:  Activity Barriers & Cardiac Risk Stratification - 02/22/22 1555       Activity Barriers & Cardiac Risk Stratification   Activity Barriers Balance Concerns;Other (comment);Back Problems;Deconditioning;Right Knee Replacement;Left Knee Replacement;Muscular Weakness;History of Falls;Joint Problems    Comments neuropathy, multiple back surgeries, dizziness    Cardiac Risk Stratification High             6 Minute Walk:  6 Minute Walk     Row Name 02/22/22 1609         6 Minute Walk   Phase Initial     Distance 1020 feet     Walk Time 6 minutes     # of Rest Breaks 0     MPH 1.93     METS 2.07     RPE 10     Perceived Dyspnea  0     VO2 Peak 7.27     Symptoms Yes (comment)     Comments Right knee pain 3/10     Resting HR 62 bpm     Resting BP 98/56     Resting Oxygen Saturation  98 %     Exercise Oxygen Saturation  during 6 min walk 96 %     Max Ex. HR 105 bpm     Max Ex. BP 122/58     2 Minute Post BP 108/60               Oxygen Initial Assessment:   Oxygen Re-Evaluation:   Oxygen Discharge (Final Oxygen Re-Evaluation):   Initial Exercise Prescription:  Initial Exercise Prescription - 02/22/22 1600       Date of Initial Exercise RX and Referring Provider   Date 02/22/22    Referring Provider Garth Schlatter MD      Oxygen   Maintain Oxygen Saturation 88% or higher      Recumbant Bike   Level 1    RPM 60    Watts 10    Minutes 15    METs 2      NuStep   Level 1    SPM 80  Minutes 15    METs 2      Biostep-RELP   Level 1    SPM 50    Minutes 15    METs 2      Track   Laps 14    Minutes 15    METs 1.7      Prescription Details   Frequency (times per week) 3    Duration Progress to 30 minutes of continuous aerobic without signs/symptoms of physical distress      Intensity   THRR 40-80% of Max Heartrate 94- 127    Ratings of Perceived Exertion 11-13    Perceived Dyspnea 0-4      Progression   Progression Continue to progress workloads to maintain intensity without signs/symptoms of physical distress.      Resistance Training   Training Prescription Yes    Weight 4 lb    Reps 10-15             Perform Capillary Blood Glucose checks as needed.  Exercise Prescription Changes:   Exercise Prescription Changes     Row Name 02/22/22 1600 04/11/22 1200           Response to Exercise   Blood Pressure (Admit) 98/56 122/62      Blood Pressure (Exercise) 122/58 128/66      Blood Pressure (Exit) 108/60 118/60      Heart Rate (Admit) 62 bpm 78 bpm      Heart Rate (Exercise) 105 bpm 116 bpm      Heart Rate (Exit) 64 bpm 69 bpm      Oxygen Saturation (Admit) 98 % --      Oxygen Saturation (Exercise) 96 % --      Oxygen Saturation (Exit) 96 % --      Rating of Perceived Exertion (Exercise) 10 14      Perceived Dyspnea (Exercise) 0 --      Symptoms Right knee pain 3/10 none      Comments walk test results 1st full day of exercise      Duration  -- Progress to 30 minutes of  aerobic without signs/symptoms of physical distress      Intensity -- THRR unchanged        Progression   Progression -- Continue to progress workloads to maintain intensity without signs/symptoms of physical distress.      Average METs -- 1.82        Resistance Training   Training Prescription -- Yes      Weight -- 4 lb      Reps -- 10-15        NuStep   Level -- 3      Minutes -- 15      METs -- 2.5        Biostep-RELP   Level -- 1      Minutes -- 15      METs -- 2        Oxygen   Maintain Oxygen Saturation -- 88% or higher               Exercise Comments:   Exercise Comments     Row Name 02/26/22 1404           Exercise Comments First full day of exercise!  Patient was oriented to gym and equipment including functions, settings, policies, and procedures.  Patient's individual exercise prescription and treatment plan were reviewed.  All starting workloads were established based on the results of the 6  minute walk test done at initial orientation visit.  The plan for exercise progression was also introduced and progression will be customized based on patient's performance and goals.                Exercise Goals and Review:   Exercise Goals     Row Name 02/22/22 1634             Exercise Goals   Increase Physical Activity Yes       Intervention Provide advice, education, support and counseling about physical activity/exercise needs.;Develop an individualized exercise prescription for aerobic and resistive training based on initial evaluation findings, risk stratification, comorbidities and participant's personal goals.       Expected Outcomes Short Term: Attend rehab on a regular basis to increase amount of physical activity.;Long Term: Add in home exercise to make exercise part of routine and to increase amount of physical activity.;Long Term: Exercising regularly at least 3-5 days a week.       Increase Strength and  Stamina Yes       Intervention Provide advice, education, support and counseling about physical activity/exercise needs.;Develop an individualized exercise prescription for aerobic and resistive training based on initial evaluation findings, risk stratification, comorbidities and participant's personal goals.       Expected Outcomes Short Term: Increase workloads from initial exercise prescription for resistance, speed, and METs.;Short Term: Perform resistance training exercises routinely during rehab and add in resistance training at home;Long Term: Improve cardiorespiratory fitness, muscular endurance and strength as measured by increased METs and functional capacity (6MWT)       Able to understand and use rate of perceived exertion (RPE) scale Yes       Intervention Provide education and explanation on how to use RPE scale       Expected Outcomes Short Term: Able to use RPE daily in rehab to express subjective intensity level;Long Term:  Able to use RPE to guide intensity level when exercising independently       Able to understand and use Dyspnea scale Yes       Intervention Provide education and explanation on how to use Dyspnea scale       Expected Outcomes Short Term: Able to use Dyspnea scale daily in rehab to express subjective sense of shortness of breath during exertion;Long Term: Able to use Dyspnea scale to guide intensity level when exercising independently       Knowledge and understanding of Target Heart Rate Range (THRR) Yes       Intervention Provide education and explanation of THRR including how the numbers were predicted and where they are located for reference       Expected Outcomes Short Term: Able to state/look up THRR;Long Term: Able to use THRR to govern intensity when exercising independently;Short Term: Able to use daily as guideline for intensity in rehab       Able to check pulse independently Yes       Intervention Provide education and demonstration on how to check pulse  in carotid and radial arteries.;Review the importance of being able to check your own pulse for safety during independent exercise       Expected Outcomes Short Term: Able to explain why pulse checking is important during independent exercise;Long Term: Able to check pulse independently and accurately       Understanding of Exercise Prescription Yes       Intervention Provide education, explanation, and written materials on patient's individual exercise prescription  Expected Outcomes Short Term: Able to explain program exercise prescription;Long Term: Able to explain home exercise prescription to exercise independently                Exercise Goals Re-Evaluation :  Exercise Goals Re-Evaluation     Row Name 02/26/22 1404 04/09/22 1430 04/11/22 1204         Exercise Goal Re-Evaluation   Exercise Goals Review Increase Physical Activity;Able to understand and use rate of perceived exertion (RPE) scale;Understanding of Exercise Prescription;Knowledge and understanding of Target Heart Rate Range (THRR);Increase Strength and Stamina;Able to understand and use Dyspnea scale;Able to check pulse independently Increase Physical Activity;Understanding of Exercise Prescription;Increase Strength and Stamina Increase Physical Activity;Understanding of Exercise Prescription;Increase Strength and Stamina     Comments Reviewed RPE and dyspnea scales, THR and program prescription with pt today.  Pt voiced understanding and was given a copy of goals to take home. Sylvestre is really enjoying the program. He wants to return to a more active lifestyle and increase his strength and stamina. He states that he is currently doing some bodyweight exercises at home given to hime by [employer]. He wants to return to being able to do the yard work he likes to do. He states that he is walking at least a mile everyday as well for endurance training. Hermenegildo is off to a good start in rehab. He had an average overall MET  level of 1.82 METs. He also got up to level 3 on the T4. He tolerated using 4 lb hand weights for resistance training as well. We will continue to monitor his progress in the program.     Expected Outcomes Short: Use RPE daily to regulate intensity. Long: Follow program prescription in THR. Short: Continue to walk at home. Long: Continue to improve strength and stamina. Short: Continue to attend rehab and follow exercise prescription. Long: Continue to improve overall MET levels.              Discharge Exercise Prescription (Final Exercise Prescription Changes):  Exercise Prescription Changes - 04/11/22 1200       Response to Exercise   Blood Pressure (Admit) 122/62    Blood Pressure (Exercise) 128/66    Blood Pressure (Exit) 118/60    Heart Rate (Admit) 78 bpm    Heart Rate (Exercise) 116 bpm    Heart Rate (Exit) 69 bpm    Rating of Perceived Exertion (Exercise) 14    Symptoms none    Comments 1st full day of exercise    Duration Progress to 30 minutes of  aerobic without signs/symptoms of physical distress    Intensity THRR unchanged      Progression   Progression Continue to progress workloads to maintain intensity without signs/symptoms of physical distress.    Average METs 1.82      Resistance Training   Training Prescription Yes    Weight 4 lb    Reps 10-15      NuStep   Level 3    Minutes 15    METs 2.5      Biostep-RELP   Level 1    Minutes 15    METs 2      Oxygen   Maintain Oxygen Saturation 88% or higher             Nutrition:  Target Goals: Understanding of nutrition guidelines, daily intake of sodium '1500mg'$ , cholesterol '200mg'$ , calories 30% from fat and 7% or less from saturated fats, daily to have 5 or more  servings of fruits and vegetables.  Education: All About Nutrition: -Group instruction provided by verbal, written material, interactive activities, discussions, models, and posters to present general guidelines for heart healthy nutrition  including fat, fiber, MyPlate, the role of sodium in heart healthy nutrition, utilization of the nutrition label, and utilization of this knowledge for meal planning. Follow up email sent as well. Written material given at graduation. Flowsheet Row Cardiac Rehab from 02/22/2022 in [facility] Cardiac and Pulmonary Rehab  Education need identified 02/22/22       Biometrics:  Pre Biometrics - 02/22/22 1554       Pre Biometrics   Height 5' 5.75" (1.67 m)    Weight 164 lb 11.2 oz (74.7 kg)    BMI (Calculated) 26.79    Single Leg Stand 0 seconds              Nutrition Therapy Plan and Nutrition Goals:  Nutrition Therapy & Goals - 02/22/22 1557       Personal Nutrition Goals   Comments Wife would like to be present during RD appointment      Intervention Plan   Intervention Prescribe, educate and counsel regarding individualized specific dietary modifications aiming towards targeted core components such as weight, hypertension, lipid management, diabetes, heart failure and other comorbidities.    Expected Outcomes Short Term Goal: Understand basic principles of dietary content, such as calories, fat, sodium, cholesterol and nutrients.;Short Term Goal: A plan has been developed with personal nutrition goals set during dietitian appointment.;Long Term Goal: Adherence to prescribed nutrition plan.             Nutrition Assessments:  MEDIFICTS Score Key: ?70 Need to make dietary changes  40-70 Heart Healthy Diet ? 40 Therapeutic Level Cholesterol Diet  Flowsheet Row Cardiac Rehab from 02/22/2022 in [facility] Cardiac and Pulmonary Rehab  Picture Your Plate Total Score on Admission 62      Picture Your Plate Scores: <33 Unhealthy dietary pattern with much room for improvement. 41-50 Dietary pattern unlikely to meet recommendations for good health and room for improvement. 51-60 More healthful dietary pattern, with some room for improvement.  >60 Healthy dietary pattern, although there  may be some specific behaviors that could be improved.    Nutrition Goals Re-Evaluation:  Nutrition Goals Re-Evaluation     [place] Name 04/09/22 1443             Goals   Comment Graylin does not want to meet with the RD at this time, but would be open to speaking with her in the future.       Expected Outcome Short: Plan to meet with RD Long: Schedule meeting with RD                Nutrition Goals Discharge (Final Nutrition Goals Re-Evaluation):  Nutrition Goals Re-Evaluation - 04/09/22 1443       Goals   Comment Rawley does not want to meet with the RD at this time, but would be open to speaking with her in the future.    Expected Outcome Short: Plan to meet with RD Long: Schedule meeting with RD             Psychosocial: Target Goals: Acknowledge presence or absence of significant depression and/or stress, maximize coping skills, provide positive support system. Participant is able to verbalize types and ability to use techniques and skills needed for reducing stress and depression.   Education: Stress, Anxiety, and Depression - Group verbal and visual presentation to define  topics covered.  Reviews how body is impacted by stress, anxiety, and depression.  Also discusses healthy ways to reduce stress and to treat/manage anxiety and depression.  Written material given at graduation.   Education: Sleep Hygiene -Provides group verbal and written instruction about how sleep can affect your health.  Define sleep hygiene, discuss sleep cycles and impact of sleep habits. Review good sleep hygiene tips.    Initial Review & Psychosocial Screening:  Initial Psych Review & Screening - 02/12/22 1533       Initial Review   Current issues with None Identified      Family Dynamics   Good Support System? Yes   wife,     Barriers   Psychosocial barriers to participate in program There are no identifiable barriers or psychosocial needs.      Screening Interventions    Interventions Encouraged to exercise    Expected Outcomes Short Term goal: Utilizing psychosocial counselor, staff and physician to assist with identification of specific Stressors or current issues interfering with healing process. Setting desired goal for each stressor or current issue identified.;Long Term Goal: Stressors or current issues are controlled or eliminated.;Short Term goal: Identification and review with participant of any Quality of Life or Depression concerns found by scoring the questionnaire.;Long Term goal: The participant improves quality of Life and PHQ9 Scores as seen by post scores and/or verbalization of changes             Quality of Life Scores:   Quality of Life - 02/22/22 1556       Quality of Life   Select Quality of Life      Quality of Life Scores   Health/Function Pre 8.3 %    Socioeconomic Pre 25.19 %    Psych/Spiritual Pre 22.71 %    Family Pre 30 %    GLOBAL Pre 18.14 %            Scores of 19 and below usually indicate a poorer quality of life in these areas.  A difference of  2-3 points is a clinically meaningful difference.  A difference of 2-3 points in the total score of the Quality of Life Index has been associated with significant improvement in overall quality of life, self-image, physical symptoms, and general health in studies assessing change in quality of life.  PHQ-9: Review Flowsheet       04/09/2022 02/22/2022  Depression screen PHQ 2/9  Decreased Interest 0 0  Down, Depressed, Hopeless 0 1  PHQ - 2 Score 0 1  Altered sleeping 0 2  Tired, decreased energy 3 2  Change in appetite 0 0  Feeling bad or failure about yourself  0 0  Trouble concentrating 0 0  Moving slowly or fidgety/restless 0 1  Suicidal thoughts 0 0  PHQ-9 Score 3 6  Difficult doing work/chores Not difficult at all Not difficult at all   Interpretation of Total Score  Total Score Depression Severity:  1-4 = Minimal depression, 5-9 = Mild depression,  10-14 = Moderate depression, 15-19 = Moderately severe depression, 20-27 = Severe depression   Psychosocial Evaluation and Intervention:  Psychosocial Evaluation - 02/12/22 1554       Psychosocial Evaluation & Interventions   Comments Dayten has no barriers to attending the program. He lives with his wife and she is his support. He wants to get healthy again and return to his ability to [place] yard s and weed the garden. He has an ICD and it has shocked  him a few times. The threshold has been decreased to about 150 beats as of last month when he had several episodes of VTach. 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 He has had several falls, from the neuropathy and from dizzy spells.  He understands that we will not have him on the treadmill to start. He is ready to get started    Expected Outcomes STG Noble attends all scheduled sessions, he is able to progress with his exercise to return to his lawn chores without too much effort. LTG Cadarius continues to progress with his day to day activity.    Continue Psychosocial Services  Follow up required by staff             Psychosocial Re-Evaluation:  Psychosocial Re-Evaluation     [place] Name 04/09/22 1437             Psychosocial Re-Evaluation   Current issues with Current Stress Concerns;Current Sleep Concerns       Comments Natalio states that he has been frustrated because he feels he is not as healthy as used to be. He is motivated to work hard to get back to feeling healthy and fit. He also states that exercise is a good stress reliever for him. He states that his wife, son, and friends are a good support system for him and keep him motivated. He states that he is having some issues with his sleep due to some medication changes.       Expected Outcomes Short: Continue to attend rehab for stress relief. Long: Maintain positive outlook.       Continue Psychosocial Services  Follow up required by staff                 Psychosocial Discharge (Final Psychosocial Re-Evaluation):  Psychosocial Re-Evaluation - 04/09/22 1437       Psychosocial Re-Evaluation   Current issues with Current Stress Concerns;Current Sleep Concerns    Comments Griffin states that he has been frustrated because he feels he is not as healthy as used to be. He is motivated to work hard to get back to feeling healthy and fit. He also states that exercise is a good stress reliever for him. He states that his wife, son, and friends are a good support system for him and keep him motivated. He states that he is having some issues with his sleep due to some medication changes.    Expected Outcomes Short: Continue to attend rehab for stress relief. Long: Maintain positive outlook.    Continue Psychosocial Services  Follow up required by staff             Vocational Rehabilitation: Provide vocational rehab assistance to qualifying candidates.   Vocational Rehab Evaluation & Intervention:   Education: Education Goals: Education classes will be provided on a variety of topics geared toward better understanding of heart health and risk factor modification. Participant will state understanding/return demonstration of topics presented as noted by education test scores.  Learning Barriers/Preferences:   General Cardiac Education Topics:  AED/CPR: - Group verbal and written instruction with the use of models to demonstrate the basic use of the AED with the basic ABC's of resuscitation.   Anatomy and Cardiac Procedures: - Group verbal and visual presentation and models provide information about basic cardiac anatomy and function. Reviews the testing methods done to diagnose heart disease and the outcomes of the test results. Describes the treatment choices: Medical Management, Angioplasty, or Coronary  Bypass Surgery for treating various heart conditions including Myocardial Infarction, Angina, Valve Disease, and Cardiac Arrhythmias.   Written material given at graduation. Flowsheet Row Cardiac Rehab from 02/22/2022 in [facility] Cardiac and Pulmonary Rehab  Education need identified 02/22/22       Medication Safety: - Group verbal and visual instruction to review commonly prescribed medications for heart and lung disease. Reviews the medication, class of the drug, and side effects. Includes the steps to properly store meds and maintain the prescription regimen.  Written material given at graduation.   Intimacy: - Group verbal instruction through game format to discuss how heart and lung disease can affect sexual intimacy. Written material given at graduation..   Know Your Numbers and Heart Failure: - Group verbal and visual instruction to discuss disease risk factors for cardiac and pulmonary disease and treatment options.  Reviews associated critical values for Overweight/Obesity, Hypertension, Cholesterol, and Diabetes.  Discusses basics of heart failure: signs/symptoms and treatments.  Introduces Heart Failure Zone chart for action plan for heart failure.  Written material given at graduation. Flowsheet Row Cardiac Rehab from 02/22/2022 in [facility] Cardiac and Pulmonary Rehab  Education need identified 02/22/22       Infection Prevention: - Provides verbal and written material to individual with discussion of infection control including proper hand washing and proper equipment cleaning during exercise session. Flowsheet Row Cardiac Rehab from 02/22/2022 in [facility] Cardiac and Pulmonary Rehab  Education need identified 02/22/22  Date 02/22/22  Educator Bell Arthur  Instruction Review Code 1- Verbalizes Understanding       Falls Prevention: - Provides verbal and written material to individual with discussion of falls prevention and safety. Flowsheet Row Cardiac Rehab from 02/12/2022 in [facility] Cardiac and Pulmonary Rehab  Date 02/12/22  Educator SB  Instruction Review Code 1- Verbalizes Understanding       Other: -Provides group  and verbal instruction on various topics (see comments)   Knowledge Questionnaire Score:  Knowledge Questionnaire Score - 02/22/22 1556       Knowledge Questionnaire Score   Pre Score 20/26             Core Components/Risk Factors/Patient Goals at Admission:  Personal Goals and Risk Factors at Admission - 02/22/22 1635       Core Components/Risk Factors/Patient Goals on Admission    Weight Management Yes;Weight Maintenance    Intervention Weight Management: Develop a combined nutrition and exercise program designed to reach desired caloric intake, while maintaining appropriate intake of nutrient and fiber, sodium and fats, and appropriate energy expenditure required for the weight goal.;Weight Management: Provide education and appropriate resources to help participant work on and attain dietary goals.;Weight Management/Obesity: Establish reasonable short term and long term weight goals.    Admit Weight 164 lb (74.4 kg)    Goal Weight: Short Term 164 lb (74.4 kg)    Goal Weight: Long Term 164 lb (74.4 kg)    Expected Outcomes Short Term: Continue to assess and modify interventions until short term weight is achieved;Long Term: Adherence to nutrition and physical activity/exercise program aimed toward attainment of established weight goal;Weight Maintenance: Understanding of the daily nutrition guidelines, which includes 25-35% calories from fat, 7% or less cal from saturated fats, less than 227m cholesterol, less than 1.5gm of sodium, & 5 or more servings of fruits and vegetables daily;Understanding recommendations for meals to include 15-35% energy as protein, 25-35% energy from fat, 35-60% energy from carbohydrates, less than 2012mof dietary cholesterol, 20-35 gm of total fiber daily;Understanding of  distribution of calorie intake throughout the day with the consumption of 4-5 meals/snacks    Heart Failure Yes    Intervention Provide a combined exercise and nutrition program that is  supplemented with education, support and counseling about heart failure. Directed toward relieving symptoms such as shortness of breath, decreased exercise tolerance, and extremity edema.    Expected Outcomes Improve functional capacity of life;Short term: Attendance in program 2-3 days a week with increased exercise capacity. Reported lower sodium intake. Reported increased fruit and vegetable intake. Reports medication compliance.;Short term: Daily weights obtained and reported for increase. Utilizing diuretic protocols set by physician.;Long term: Adoption of self-care skills and reduction of barriers for early signs and symptoms recognition and intervention leading to self-care maintenance.    Hypertension Yes    Intervention Provide education on lifestyle modifcations including regular physical activity/exercise, weight management, moderate sodium restriction and increased consumption of fresh fruit, vegetables, and low fat dairy, alcohol moderation, and smoking cessation.;Monitor prescription use compliance.    Expected Outcomes Short Term: Continued assessment and intervention until BP is < 140/64mm HG in hypertensive participants. < 130/78mm HG in hypertensive participants with diabetes, heart failure or chronic kidney disease.;Long Term: Maintenance of blood pressure at goal levels.    Lipids Yes    Intervention Provide education and support for participant on nutrition & aerobic/resistive exercise along with prescribed medications to achieve LDL '70mg'$ , HDL >$Remo'40mg'bpLGT$ .    Expected Outcomes Short Term: Participant states understanding of desired cholesterol values and is compliant with medications prescribed. Participant is following exercise prescription and nutrition guidelines.;Long Term: Cholesterol controlled with medications as prescribed, with individualized exercise RX and with personalized nutrition plan. Value goals: LDL < $Rem'70mg'yacM$ , HDL > 40 mg.             Education:Diabetes - Individual  verbal and written instruction to review signs/symptoms of diabetes, desired ranges of glucose level fasting, after meals and with exercise. Acknowledge that pre and post exercise glucose checks will be done for 3 sessions at entry of program.   Core Components/Risk Factors/Patient Goals Review:   Goals and Risk Factor Review     Row Name 04/09/22 1445             Core Components/Risk Factors/Patient Goals Review   Personal Goals Review Weight Management/Obesity;Hypertension       Review Collyn would like to maintain a weight of 165-170 lbs. He has been weighing himself everyday and states that he has been staying between 160-170 lbs. He also has been checking his BP every day and states that it has been staying within normal ranges. He was encouraged to continue to check both his weight and BP every day.       Expected Outcomes Short: Continue to check BP at home. Long: Continue to work towards weight goal.                Core Components/Risk Factors/Patient Goals at Discharge (Final Review):   Goals and Risk Factor Review - 04/09/22 1445       Core Components/Risk Factors/Patient Goals Review   Personal Goals Review Weight Management/Obesity;Hypertension    Review Buryl would like to maintain a weight of 165-170 lbs. He has been weighing himself everyday and states that he has been staying between 160-170 lbs. He also has been checking his BP every day and states that it has been staying within normal ranges. He was encouraged to continue to check both his weight and BP every day.    Expected  Outcomes Short: Continue to check BP at home. Long: Continue to work towards weight goal.             ITP Comments:  ITP Comments     Row Name 02/12/22 1600 02/22/22 1554 02/26/22 1404 03/20/22 1134 03/21/22 1035   ITP Comments Virtual orientation call completed today. he has an appointment on Date: 02/22/2022  for EP eval and gym Orientation.  Documentation of diagnosis can be  found in [facility] records Date: 02/22/2022 . Completed 6MWT and gym orientation. Initial ITP created and sent for review to Dr. Emily Filbert, Medical Director. First full day of exercise!  Patient was oriented to gym and equipment including functions, settings, policies, and procedures.  Patient's individual exercise prescription and treatment plan were reviewed.  All starting workloads were established based on the results of the 6 minute walk test done at initial orientation visit.  The plan for exercise progression was also introduced and progression will be customized based on patient's performance and goals. Estelle has not been to rehab since 02/26/22 which was his 1st day of exercise. 30 Day review completed. Medical Director ITP review done, changes made as directed, and signed approval by Medical Director.   New out on medical leave    [place] Name 04/04/22 1452 04/18/22 1000         ITP Comments Pt informed of 4.1 lb weight gain since class 2 days ago.. Pt states that he is traying to gain weight; is on diuretic. He is monitoring. 30 Day review completed. Medical Director ITP review done, changes made as directed, and signed approval by Medical Director.               Comments:

## 2022-04-18 NOTE — Progress Notes (Signed)
Daily Session Note  Patient Details  Name: Governor Matos MRN: [id number] Date of Birth: June 18, 1946 Referring Provider:   Flowsheet Row Cardiac Rehab from 02/22/2022 in [facility] Cardiac and Pulmonary Rehab  Referring Provider Garth Schlatter MD       Encounter Date: 04/18/2022  Check In:  Session Check In - 04/18/22 1435       Check-In   Supervising physician immediately available to respond to emergencies See telemetry face sheet for immediately available ER MD    Location [facility]-Cardiac & Pulmonary Rehab    Staff Present Antionette Fairy, BS, Exercise Physiologist;Laureen Owens Shark, BS, RRT, CPFT;Kara Eliezer Bottom, MS, ASCM CEP, Exercise Physiologist;Susanne Bice, RN, BSN, CCRP    Virtual Visit No    Medication changes reported     No    Fall or balance concerns reported    No    Warm-up and Cool-down Performed on first and last piece of equipment    Resistance Training Performed Yes    VAD Patient? No    PAD/SET Patient? No      Pain Assessment   Currently in Pain? No/denies    Multiple Pain Sites No            [place] has a ventricular ablation scheduled for 05/24/2022.    Social History   Tobacco Use  Smoking Status Former   Years: 15.00   Types: Cigarettes  Smokeless Tobacco Never    Goals Met:  Independence with exercise equipment Exercise tolerated well No report of concerns or symptoms today  Goals Unmet:  Not Applicable  Comments: Pt able to follow exercise prescription today without complaint.  Will continue to monitor for progression.    Dr. Emily Filbert is Medical Director for [place].  Dr. Ottie Glazier is Medical Director for [facility] Pulmonary Rehabilitation.

## 2022-04-23 ENCOUNTER — Encounter: Payer: No Typology Code available for payment source | Admitting: *Deleted

## 2022-04-23 DIAGNOSIS — I5022 Chronic systolic (congestive) heart failure: Secondary | ICD-10-CM | POA: Diagnosis not present

## 2022-04-23 NOTE — Progress Notes (Signed)
Daily Session Note  Patient Details  Name: Danny Marquez MRN: [id number] Date of Birth: 04-26-1946 Referring Provider:   Flowsheet Row Cardiac Rehab from 02/22/2022 in [facility] Cardiac and Pulmonary Rehab  Referring Provider Garth Schlatter MD       Encounter Date: 04/23/2022  Check In:  Session Check In - 04/23/22 1416       Check-In   Supervising physician immediately available to respond to emergencies See telemetry face sheet for immediately available ER MD    Location [facility]-Cardiac & Pulmonary Rehab    Staff Present Renita Papa, RN BSN;Melissa [place], RDN, Tawanna Solo, MS, ASCM CEP, Exercise Physiologist    Virtual Visit No    Medication changes reported     No    Fall or balance concerns reported    No    Warm-up and Cool-down Performed on first and last piece of equipment    Resistance Training Performed Yes    VAD Patient? No    PAD/SET Patient? No      Pain Assessment   Currently in Pain? No/denies                Social History   Tobacco Use  Smoking Status Former   Years: 15.00   Types: Cigarettes  Smokeless Tobacco Never    Goals Met:  Independence with exercise equipment Exercise tolerated well No report of concerns or symptoms today Strength training completed today  Goals Unmet:  Not Applicable  Comments: Pt able to follow exercise prescription today without complaint.  Will continue to monitor for progression.    Dr. Emily Filbert is Medical Director for [place].  Dr. Ottie Glazier is Medical Director for [facility] Pulmonary Rehabilitation.

## 2022-04-25 ENCOUNTER — Encounter: Payer: No Typology Code available for payment source | Admitting: *Deleted

## 2022-04-25 DIAGNOSIS — I5022 Chronic systolic (congestive) heart failure: Secondary | ICD-10-CM

## 2022-04-25 NOTE — Progress Notes (Signed)
Daily Session Note  Patient Details  Name: Danny Marquez MRN: [id number] Date of Birth: [birth year] Referring Provider:   Flowsheet Row Cardiac Rehab from 02/22/2022 in [facility] Cardiac and Pulmonary Rehab  Referring Provider Garth Schlatter MD       Encounter Date: 04/25/2022  Check In:  Session Check In - 04/25/22 1417       Check-In   Supervising physician immediately available to respond to emergencies See telemetry face sheet for immediately available ER MD    Location [facility]-Cardiac & Pulmonary Rehab    Staff Present Hope Budds, RDN, Tawanna Solo, MS, ASCM CEP, Exercise Physiologist;Kalee Mcclenathan Sherryll Burger, RN BSN    Virtual Visit No    Medication changes reported     No    Fall or balance concerns reported    No    Warm-up and Cool-down Performed on first and last piece of equipment    Resistance Training Performed Yes    VAD Patient? No    PAD/SET Patient? No      Pain Assessment   Currently in Pain? No/denies                Social History   Tobacco Use  Smoking Status Former   Years: 15.00   Types: Cigarettes  Smokeless Tobacco Never    Goals Met:  Independence with exercise equipment Exercise tolerated well No report of concerns or symptoms today Strength training completed today  Goals Unmet:  Not Applicable  Comments: Pt able to follow exercise prescription today without complaint.  Will continue to monitor for progression.    Dr. Emily Filbert is Medical Director for [place].  Dr. Ottie Glazier is Medical Director for [facility] Pulmonary Rehabilitation.

## 2022-04-30 DIAGNOSIS — Q211 Atrial septal defect, unspecified: Secondary | ICD-10-CM

## 2022-04-30 HISTORY — DX: Atrial septal defect, unspecified: Q21.10

## 2022-05-02 ENCOUNTER — Encounter: Payer: No Typology Code available for payment source | Admitting: *Deleted

## 2022-05-02 DIAGNOSIS — I5022 Chronic systolic (congestive) heart failure: Secondary | ICD-10-CM | POA: Diagnosis not present

## 2022-05-02 NOTE — Progress Notes (Signed)
Daily Session Note  Patient Details  Name: Aidynn Krenn MRN: [id number] Date of Birth: 07-04-1946 Referring Provider:   Flowsheet Row Cardiac Rehab from 02/22/2022 in [facility] Cardiac and Pulmonary Rehab  Referring Provider Garth Schlatter MD       Encounter Date: 05/02/2022  Check In:  Session Check In - 05/02/22 1420       Check-In   Supervising physician immediately available to respond to emergencies See telemetry face sheet for immediately available ER MD    Location [facility]-Cardiac & Pulmonary Rehab    Staff Present Hope Budds, RDN, LDN;Joseph Tessie Fass, Cristopher Estimable, RN BSN    Virtual Visit No    Medication changes reported     No    Fall or balance concerns reported    No    Warm-up and Cool-down Performed on first and last piece of equipment    Resistance Training Performed Yes    VAD Patient? No    PAD/SET Patient? No      Pain Assessment   Currently in Pain? No/denies                Social History   Tobacco Use  Smoking Status Former   Years: 15.00   Types: Cigarettes  Smokeless Tobacco Never    Goals Met:  Independence with exercise equipment Exercise tolerated well No report of concerns or symptoms today Strength training completed today  Goals Unmet:  Not Applicable  Comments: Pt able to follow exercise prescription today without complaint.  Will continue to monitor for progression.    Dr. Emily Filbert is Medical Director for [place].  Dr. Ottie Glazier is Medical Director for [facility] Pulmonary Rehabilitation.

## 2022-05-03 ENCOUNTER — Encounter: Payer: No Typology Code available for payment source | Admitting: *Deleted

## 2022-05-03 DIAGNOSIS — I5022 Chronic systolic (congestive) heart failure: Secondary | ICD-10-CM

## 2022-05-03 NOTE — Progress Notes (Signed)
Daily Session Note  Patient Details  Name: Danny Marquez MRN: [id number] Date of Birth: 1946/09/03 Referring Provider:   Flowsheet Row Cardiac Rehab from 02/22/2022 in [facility] Cardiac and Pulmonary Rehab  Referring Provider Garth Schlatter MD       Encounter Date: 05/03/2022  Check In:  Session Check In - 05/03/22 1445       Check-In   Supervising physician immediately available to respond to emergencies See telemetry face sheet for immediately available ER MD    Location [facility]-Cardiac & Pulmonary Rehab    Staff Present Renita Papa, RN BSN;Noah Tickle, BS, Exercise Physiologist;Joseph Vernon, RCP,RRT,BSRT;Laureen Grover, BS, RRT, CPFT    Virtual Visit No    Medication changes reported     No    Fall or balance concerns reported    No    Warm-up and Cool-down Performed on first and last piece of equipment    Resistance Training Performed Yes    VAD Patient? No    PAD/SET Patient? No      Pain Assessment   Currently in Pain? No/denies                Social History   Tobacco Use  Smoking Status Former   Years: 15.00   Types: Cigarettes  Smokeless Tobacco Never    Goals Met:  Independence with exercise equipment Exercise tolerated well No report of concerns or symptoms today Strength training completed today  Goals Unmet:  Not Applicable  Comments: Pt able to follow exercise prescription today without complaint.  Will continue to monitor for progression.    Dr. Emily Filbert is Medical Director for [place].  Dr. Ottie Glazier is Medical Director for [facility] Pulmonary Rehabilitation.

## 2022-05-07 ENCOUNTER — Encounter: Payer: No Typology Code available for payment source | Admitting: *Deleted

## 2022-05-07 DIAGNOSIS — I5022 Chronic systolic (congestive) heart failure: Secondary | ICD-10-CM

## 2022-05-07 NOTE — Progress Notes (Signed)
Daily Session Note  Patient Details  Name: Lachlan Mckim MRN: [id number] Date of Birth: 05-08-1946 Referring Provider:   Flowsheet Row Cardiac Rehab from 02/22/2022 in [facility] Cardiac and Pulmonary Rehab  Referring Provider Garth Schlatter MD       Encounter Date: 05/07/2022  Check In:  Session Check In - 05/07/22 1407       Check-In   Supervising physician immediately available to respond to emergencies See telemetry face sheet for immediately available ER MD    Location [facility]-Cardiac & Pulmonary Rehab    Staff Present Renita Papa, RN BSN;Melissa [place], RDN, LDN;Joseph [place], [place]    Virtual Visit No    Medication changes reported     No    Fall or balance concerns reported    No    Warm-up and Cool-down Performed on first and last piece of equipment    Resistance Training Performed Yes    VAD Patient? No    PAD/SET Patient? No      Pain Assessment   Currently in Pain? No/denies                Social History   Tobacco Use  Smoking Status Former   Years: 15.00   Types: Cigarettes  Smokeless Tobacco Never    Goals Met:  Independence with exercise equipment Exercise tolerated well No report of concerns or symptoms today Strength training completed today  Goals Unmet:  Not Applicable  Comments: Pt able to follow exercise prescription today without complaint.  Will continue to monitor for progression.    Dr. Emily Filbert is Medical Director for [place].  Dr. Ottie Glazier is Medical Director for [facility] Pulmonary Rehabilitation.

## 2022-05-09 ENCOUNTER — Encounter: Payer: No Typology Code available for payment source | Admitting: *Deleted

## 2022-05-09 DIAGNOSIS — I5022 Chronic systolic (congestive) heart failure: Secondary | ICD-10-CM | POA: Diagnosis not present

## 2022-05-09 NOTE — Progress Notes (Signed)
Daily Session Note  Patient Details  Name: Danny Marquez MRN: [id number] Date of Birth: 11/07/1945 Referring Provider:   Flowsheet Row Cardiac Rehab from 02/22/2022 in [facility] Cardiac and Pulmonary Rehab  Referring Provider Garth Schlatter MD       Encounter Date: 05/09/2022  Check In:  Session Check In - 05/09/22 1406       Check-In   Supervising physician immediately available to respond to emergencies See telemetry face sheet for immediately available ER MD    Location [facility]-Cardiac & Pulmonary Rehab    Staff Present Alberteen Sam, MA, RCEP, CCRP, Marylynn Pearson, MS, ASCM CEP, Exercise Physiologist;Ouida Abeyta Sherryll Burger, RN BSN    Virtual Visit No    Medication changes reported     No    Fall or balance concerns reported    No    Warm-up and Cool-down Performed on first and last piece of equipment    Resistance Training Performed Yes    VAD Patient? No    PAD/SET Patient? No      Pain Assessment   Currently in Pain? No/denies                Social History   Tobacco Use  Smoking Status Former   Years: 15.00   Types: Cigarettes  Smokeless Tobacco Never    Goals Met:  Independence with exercise equipment Exercise tolerated well No report of concerns or symptoms today Strength training completed today  Goals Unmet:  Not Applicable  Comments: Pt able to follow exercise prescription today without complaint.  Will continue to monitor for progression.    Dr. Emily Filbert is Medical Director for [place].  Dr. Ottie Glazier is Medical Director for [facility] Pulmonary Rehabilitation.

## 2022-05-16 ENCOUNTER — Encounter: Payer: Self-pay | Admitting: *Deleted

## 2022-05-16 DIAGNOSIS — I5022 Chronic systolic (congestive) heart failure: Secondary | ICD-10-CM

## 2022-05-16 NOTE — Progress Notes (Signed)
Cardiac Individual Treatment Plan  Patient Details  Name: Danny Marquez MRN: [id number] Date of Birth: May 29, 1946 Referring Provider:   Flowsheet Row Cardiac Rehab from 02/22/2022 in [facility]  Referring Provider Garth Schlatter MD       Initial Encounter Date:  Flowsheet Row Cardiac Rehab from 02/22/2022 in [facility] Cardiac and Pulmonary Rehab  Date 02/22/22       Visit Diagnosis: Heart failure, chronic systolic ([place])  Patient's Home Medications on Admission:  Current Outpatient Medications:    allopurinol (ZYLOPRIM) 100 MG tablet, Take 200 mg by mouth daily., Disp: , Rfl:    amiodarone (PACERONE) 200 MG tablet, Take 100 mg by mouth 2 (two) times daily., Disp: , Rfl:    apixaban (ELIQUIS) 5 MG TABS tablet, Take 5 mg by mouth 2 (two) times daily., Disp: , Rfl:    apixaban (ELIQUIS) 5 MG TABS tablet, Take 5 mg by mouth 2 (two) times daily., Disp: , Rfl:    cefdinir (OMNICEF) 300 MG capsule, Take 1 capsule (300 mg total) by mouth 2 (two) times daily., Disp: 20 capsule, Rfl: 0   DONEPEZIL HCL PO, Take 10 mg by mouth., Disp: , Rfl:    empagliflozin (JARDIANCE) 25 MG TABS tablet, Take 12.5 mg by mouth daily., Disp: , Rfl:    HYDROcodone-acetaminophen (NORCO/VICODIN) 5-325 MG tablet, Take 1 tablet by mouth every 6 (six) hours as needed., Disp: 10 tablet, Rfl: 0   pregabalin (LYRICA) 50 MG capsule, Take 50 mg in the morning, 50 mg in the afternoon, and 100 mg at night, Disp: , Rfl:    ranolazine (RANEXA) 500 MG 12 hr tablet, Take 500 mg by mouth 2 (two) times daily., Disp: , Rfl:    rosuvastatin (CRESTOR) 5 MG tablet, , Disp: , Rfl:    sertraline (ZOLOFT) 100 MG tablet, Take 100 mg by mouth daily. , Disp: , Rfl:    spironolactone (ALDACTONE) 25 MG tablet, Take 25 mg by mouth daily., Disp: , Rfl:    torsemide (DEMADEX) 20 MG tablet, Take 20 mg by mouth daily., Disp: , Rfl:    TRAZODONE HCL PO, Take 75 mg by mouth., Disp: , Rfl:   Past Medical History: Past  Medical History:  Diagnosis Date   A-fib ([place])    CHF (congestive heart failure) (HCC)    EF 30%   Chronic kidney disease    congenital one kidney   Hypertension    Myocardial infarction ([place])    Solitary kidney     Tobacco Use: Social History   Tobacco Use  Smoking Status Former   Years: 15.00   Types: Cigarettes  Smokeless Tobacco Never    Labs: Review Flowsheet        No data to display           Exercise Target Goals: Exercise Program Goal: Individual exercise prescription set using results from initial 6 min walk test and THRR while considering  patient's activity barriers and safety.   Exercise Prescription Goal: Initial exercise prescription builds to 30-45 minutes a day of aerobic activity, 2-3 days per week.  Home exercise guidelines will be given to patient during program as part of exercise prescription that the participant will acknowledge.   Education: Aerobic Exercise: - Group verbal and visual presentation on the components of exercise prescription. Introduces F.I.T.T principle from ACSM for exercise prescriptions.  Reviews F.I.T.T. principles of aerobic exercise including progression. Written material given at graduation. Flowsheet Row Cardiac Rehab from 04/18/2022 in [facility] Cardiac  and Pulmonary Rehab  Education need identified 02/22/22       Education: Resistance Exercise: - Group verbal and visual presentation on the components of exercise prescription. Introduces F.I.T.T principle from ACSM for exercise prescriptions  Reviews F.I.T.T. principles of resistance exercise including progression. Written material given at graduation.    Education: Exercise & Equipment Safety: - Individual verbal instruction and demonstration of equipment use and safety with use of the equipment. Flowsheet Row Cardiac Rehab from 04/18/2022 in [facility] Cardiac and Pulmonary Rehab  Education need identified 02/22/22  Date 02/22/22  Educator [place]  Instruction Review Code 1-  Verbalizes Understanding       Education: Exercise Physiology & General Exercise Guidelines: - Group verbal and written instruction with models to review the exercise physiology of the cardiovascular system and associated critical values. Provides general exercise guidelines with specific guidelines to those with heart or lung disease.    Education: Flexibility, Balance, Mind/Body Relaxation: - Group verbal and visual presentation with interactive activity on the components of exercise prescription. Introduces F.I.T.T principle from ACSM for exercise prescriptions. Reviews F.I.T.T. principles of flexibility and balance exercise training including progression. Also discusses the mind body connection.  Reviews various relaxation techniques to help reduce and manage stress (i.e. Deep breathing, progressive muscle relaxation, and visualization). Balance handout provided to take home. Written material given at graduation.   Activity Barriers & Risk Stratification:  Activity Barriers & Cardiac Risk Stratification - 02/22/22 1555       Activity Barriers & Cardiac Risk Stratification   Activity Barriers Balance Concerns;Other (comment);Back Problems;Deconditioning;Right Knee Replacement;Left Knee Replacement;Muscular Weakness;History of Falls;Joint Problems    Comments neuropathy, multiple back surgeries, dizziness    Cardiac Risk Stratification High             6 Minute Walk:  6 Minute Walk     Row Name 02/22/22 1609         6 Minute Walk   Phase Initial     Distance 1020 feet     Walk Time 6 minutes     # of Rest Breaks 0     MPH 1.93     METS 2.07     RPE 10     Perceived Dyspnea  0     VO2 Peak 7.27     Symptoms Yes (comment)     Comments Right knee pain 3/10     Resting HR 62 bpm     Resting BP 98/56     Resting Oxygen Saturation  98 %     Exercise Oxygen Saturation  during 6 min walk 96 %     Max Ex. HR 105 bpm     Max Ex. BP 122/58     2 Minute Post BP 108/60               Oxygen Initial Assessment:   Oxygen Re-Evaluation:   Oxygen Discharge (Final Oxygen Re-Evaluation):   Initial Exercise Prescription:  Initial Exercise Prescription - 02/22/22 1600       Date of Initial Exercise RX and Referring Provider   Date 02/22/22    Referring Provider Garth Schlatter MD      Oxygen   Maintain Oxygen Saturation 88% or higher      Recumbant Bike   Level 1    RPM 60    Watts 10    Minutes 15    METs 2      NuStep   Level 1    SPM 80  Minutes 15    METs 2      Biostep-RELP   Level 1    SPM 50    Minutes 15    METs 2      Track   Laps 14    Minutes 15    METs 1.7      Prescription Details   Frequency (times per week) 3    Duration Progress to 30 minutes of continuous aerobic without signs/symptoms of physical distress      Intensity   THRR 40-80% of Max Heartrate 94- 127    Ratings of Perceived Exertion 11-13    Perceived Dyspnea 0-4      Progression   Progression Continue to progress workloads to maintain intensity without signs/symptoms of physical distress.      Resistance Training   Training Prescription Yes    Weight 4 lb    Reps 10-15             Perform Capillary Blood Glucose checks as needed.  Exercise Prescription Changes:   Exercise Prescription Changes     Row Name 02/22/22 1600 04/11/22 1200 04/24/22 1500 05/07/22 1500       Response to Exercise   Blood Pressure (Admit) 98/56 122/62 100/60 102/56    Blood Pressure (Exercise) 122/58 128/66 110/62 122/60    Blood Pressure (Exit) 108/60 118/60 102/62 108/58    Heart Rate (Admit) 62 bpm 78 bpm 84 bpm 97 bpm    Heart Rate (Exercise) 105 bpm 116 bpm 100 bpm 94 bpm    Heart Rate (Exit) 64 bpm 69 bpm 88 bpm 93 bpm    Oxygen Saturation (Admit) 98 % -- -- --    Oxygen Saturation (Exercise) 96 % -- -- --    Oxygen Saturation (Exit) 96 % -- -- --    Rating of Perceived Exertion (Exercise) _0 Perceived Dyspnea (Exercise) 0 -- --  --    Symptoms Right knee pain 3/10 none none none    Comments walk test results 1st full day of exercise run of vtach- asymptomatic none    Duration -- Progress to 30 minutes of  aerobic without signs/symptoms of physical distress Continue with 30 min of aerobic exercise without signs/symptoms of physical distress. Continue with 30 min of aerobic exercise without signs/symptoms of physical distress.    Intensity -- THRR unchanged THRR unchanged THRR unchanged      Progression   Progression -- Continue to progress workloads to maintain intensity without signs/symptoms of physical distress. Continue to progress workloads to maintain intensity without signs/symptoms of physical distress. Continue to progress workloads to maintain intensity without signs/symptoms of physical distress.    Average METs -- 1.82 1.92 2.81      Resistance Training   Training Prescription -- Yes Yes Yes    Weight -- 4 lb 4 lb 4 lb    Reps -- 10-15 10-15 10-15      Interval Training   Interval Training -- -- No No      Recumbant Bike   Level -- -- 2 2    Watts -- -- -- 16    Minutes -- -- 15 15    METs -- -- 2.42 2.63      NuStep   Level -- 3 -- --    Minutes -- 15 -- --    METs -- 2.5 -- --      T5 Nustep   Level -- -- 1 --  Minutes -- -- 15 --      Biostep-RELP   Level -- _0 Minutes -- _1 METs -- _2 Track   Laps -- -- 14 --    Minutes -- -- 15 --    METs -- -- 1.76 --      Home Exercise Plan   Plans to continue exercise at -- -- -- Home (comment)  walking    Frequency -- -- -- Add 2 additional days to program exercise sessions.    Initial Home Exercises Provided -- -- -- 05/07/22      Oxygen   Maintain Oxygen Saturation -- 88% or higher 88% or higher 88% or higher             Exercise Comments:   Exercise Comments     Row Name 02/26/22 1404           Exercise Comments First full day of exercise!  Patient was oriented to gym and equipment including  functions, settings, policies, and procedures.  Patient's individual exercise prescription and treatment plan were reviewed.  All starting workloads were established based on the results of the 6 minute walk test done at initial orientation visit.  The plan for exercise progression was also introduced and progression will be customized based on patient's performance and goals.                Exercise Goals and Review:   Exercise Goals     Row Name 02/22/22 1634             Exercise Goals   Increase Physical Activity Yes       Intervention Provide advice, education, support and counseling about physical activity/exercise needs.;Develop an individualized exercise prescription for aerobic and resistive training based on initial evaluation findings, risk stratification, comorbidities and participant's personal goals.       Expected Outcomes Short Term: Attend rehab on a regular basis to increase amount of physical activity.;Long Term: Add in home exercise to make exercise part of routine and to increase amount of physical activity.;Long Term: Exercising regularly at least 3-5 days a week.       Increase Strength and Stamina Yes       Intervention Provide advice, education, support and counseling about physical activity/exercise needs.;Develop an individualized exercise prescription for aerobic and resistive training based on initial evaluation findings, risk stratification, comorbidities and participant's personal goals.       Expected Outcomes Short Term: Increase workloads from initial exercise prescription for resistance, speed, and METs.;Short Term: Perform resistance training exercises routinely during rehab and add in resistance training at home;Long Term: Improve cardiorespiratory fitness, muscular endurance and strength as measured by increased METs and functional capacity (6MWT)       Able to understand and use rate of perceived exertion (RPE) scale Yes       Intervention Provide  education and explanation on how to use RPE scale       Expected Outcomes Short Term: Able to use RPE daily in rehab to express subjective intensity level;Long Term:  Able to use RPE to guide intensity level when exercising independently       Able to understand and use Dyspnea scale Yes       Intervention Provide education and explanation on how to use Dyspnea scale       Expected Outcomes Short Term: Able to use Dyspnea scale daily in  rehab to express subjective sense of shortness of breath during exertion;Long Term: Able to use Dyspnea scale to guide intensity level when exercising independently       Knowledge and understanding of Target Heart Rate Range (THRR) Yes       Intervention Provide education and explanation of THRR including how the numbers were predicted and where they are located for reference       Expected Outcomes Short Term: Able to state/look up THRR;Long Term: Able to use THRR to govern intensity when exercising independently;Short Term: Able to use daily as guideline for intensity in rehab       Able to check pulse independently Yes       Intervention Provide education and demonstration on how to check pulse in carotid and radial arteries.;Review the importance of being able to check your own pulse for safety during independent exercise       Expected Outcomes Short Term: Able to explain why pulse checking is important during independent exercise;Long Term: Able to check pulse independently and accurately       Understanding of Exercise Prescription Yes       Intervention Provide education, explanation, and written materials on patient's individual exercise prescription       Expected Outcomes Short Term: Able to explain program exercise prescription;Long Term: Able to explain home exercise prescription to exercise independently                Exercise Goals Re-Evaluation :  Exercise Goals Re-Evaluation     Row Name 02/26/22 1404 04/09/22 1430 04/11/22 1204 04/24/22  1509 05/07/22 1430     Exercise Goal Re-Evaluation   Exercise Goals Review Increase Physical Activity;Able to understand and use rate of perceived exertion (RPE) scale;Understanding of Exercise Prescription;Knowledge and understanding of Target Heart Rate Range (THRR);Increase Strength and Stamina;Able to understand and use Dyspnea scale;Able to check pulse independently Increase Physical Activity;Understanding of Exercise Prescription;Increase Strength and Stamina Increase Physical Activity;Understanding of Exercise Prescription;Increase Strength and Stamina Increase Physical Activity;Understanding of Exercise Prescription;Increase Strength and Stamina Increase Physical Activity;Understanding of Exercise Prescription;Increase Strength and Stamina   Comments Reviewed RPE and dyspnea scales, THR and program prescription with pt today.  Pt voiced understanding and was given a copy of goals to take home. Melo is really enjoying the program. He wants to return to a more active lifestyle and increase his strength and stamina. He states that he is currently doing some bodyweight exercises at home given to hime by [employer]. He wants to return to being able to do the yard work he likes to do. He states that he is walking at least a mile everyday as well for endurance training. Markus is off to a good start in rehab. He had an average overall MET level of 1.82 METs. He also got up to level 3 on the T4. He tolerated using 4 lb hand weights for resistance training as well. We will continue to monitor his progress in the program. [place] has been coming to rehab pretty consistently. He did have several runs of non-sustained v tach episodes dated on 8/2 where he was completely asymptomatic. He saw his EP doctor and they did an interrogation and lowered his rate. Other than that, he has been walking the track averaging around 14 laps. Biostep level has increased to 3. We will slowly progress based on how his rhythm  looks and how he feels. Per his wife, he is supposed to be getting a ventricular ablation in September, where we  will need to place him on medical hold with return with clearance. We will continue to monitor. Reviewed home exercise with pt today.  Pt plans to walk around his neighborhood for exercise. He will also use his personal hand weights for resistance training at home. Emphasized exercising with supervision. Reviewed THR, pulse, RPE, sign and symptoms, pulse oximetery and when to call 911 or MD.  Also discussed weather considerations and indoor options.  Pt voiced understanding.   Expected Outcomes Short: Use RPE daily to regulate intensity. Long: Follow program prescription in THR. Short: Continue to walk at home. Long: Continue to improve strength and stamina. Short: Continue to attend rehab and follow exercise prescription. Long: Continue to improve overall MET levels. Short: Watch rhythm and report any symptoms, continue walking on the track Long: Continue to increase overall strength and stamina Short: Begin walking at home on days away from rehab. Long: Continue to increase overall strength and stamina.            Discharge Exercise Prescription (Final Exercise Prescription Changes):  Exercise Prescription Changes - 05/07/22 1500       Response to Exercise   Blood Pressure (Admit) 102/56    Blood Pressure (Exercise) 122/60    Blood Pressure (Exit) 108/58    Heart Rate (Admit) 97 bpm    Heart Rate (Exercise) 94 bpm    Heart Rate (Exit) 93 bpm    Rating of Perceived Exertion (Exercise) 12    Symptoms none    Comments none    Duration Continue with 30 min of aerobic exercise without signs/symptoms of physical distress.    Intensity THRR unchanged      Progression   Progression Continue to progress workloads to maintain intensity without signs/symptoms of physical distress.    Average METs 2.81      Resistance Training   Training Prescription Yes    Weight 4 lb    Reps 10-15       Interval Training   Interval Training No      Recumbant Bike   Level 2    Watts 16    Minutes 15    METs 2.63      Biostep-RELP   Level 2    Minutes 15    METs 3      Home Exercise Plan   Plans to continue exercise at Home (comment)   walking   Frequency Add 2 additional days to program exercise sessions.    Initial Home Exercises Provided 05/07/22      Oxygen   Maintain Oxygen Saturation 88% or higher             Nutrition:  Target Goals: Understanding of nutrition guidelines, daily intake of sodium <1524m, cholesterol <202m calories 30% from fat and 7% or less from saturated fats, daily to have 5 or more servings of fruits and vegetables.  Education: All About Nutrition: -Group instruction provided by verbal, written material, interactive activities, discussions, models, and posters to present general guidelines for heart healthy nutrition including fat, fiber, MyPlate, the role of sodium in heart healthy nutrition, utilization of the nutrition label, and utilization of this knowledge for meal planning. Follow up email sent as well. Written material given at graduation. [facility]  Education need identified 02/22/22       Biometrics:  Pre Biometrics - 02/22/22 1554       Pre Biometrics   Height 5' 5.75" (1.67 m)  Weight 164 lb 11.2 oz (74.7 kg)    BMI (Calculated) 26.79    Single Leg Stand 0 seconds              Nutrition Therapy Plan and Nutrition Goals:  Nutrition Therapy & Goals - 04/23/22 1507       Nutrition Therapy   RD appointment deferred Yes   Pt would like to defer speaking about nutrition, but may want to in the future. Will continue to follow up.     Personal Nutrition Goals   Nutrition Goal Pt would like to defer speaking about nutrition, but may want to in the future. Will continue to follow up.             Nutrition Assessments:  MEDIFICTS Score  Key: ?70 Need to make dietary changes  40-70 Heart Healthy Diet ? 40 Therapeutic Level Cholesterol Diet  Flowsheet Row Cardiac Rehab from 02/22/2022 in [facility] Cardiac and Pulmonary Rehab  Picture Your Plate Total Score on Admission 62      Picture Your Plate Scores: <03 Unhealthy dietary pattern with much room for improvement. 41-50 Dietary pattern unlikely to meet recommendations for good health and room for improvement. 51-60 More healthful dietary pattern, with some room for improvement.  >60 Healthy dietary pattern, although there may be some specific behaviors that could be improved.    Nutrition Goals Re-Evaluation:  Nutrition Goals Re-Evaluation     [place] Name 04/09/22 1443             Goals   Comment Julies does not want to meet with the RD at this time, but would be open to speaking with her in the future.       Expected Outcome Short: Plan to meet with RD Long: Schedule meeting with RD                Nutrition Goals Discharge (Final Nutrition Goals Re-Evaluation):  Nutrition Goals Re-Evaluation - 04/09/22 1443       Goals   Comment Javarius does not want to meet with the RD at this time, but would be open to speaking with her in the future.    Expected Outcome Short: Plan to meet with RD Long: Schedule meeting with RD             Psychosocial: Target Goals: Acknowledge presence or absence of significant depression and/or stress, maximize coping skills, provide positive support system. Participant is able to verbalize types and ability to use techniques and skills needed for reducing stress and depression.   Education: Stress, Anxiety, and Depression - Group verbal and visual presentation to define topics covered.  Reviews how body is impacted by stress, anxiety, and depression.  Also discusses healthy ways to reduce stress and to treat/manage anxiety and depression.  Written material given at graduation.   Education: Sleep Hygiene -Provides group verbal  and written instruction about how sleep can affect your health.  Define sleep hygiene, discuss sleep cycles and impact of sleep habits. Review good sleep hygiene tips.    Initial Review & Psychosocial Screening:  Initial Psych Review & Screening - 02/12/22 1533       Initial Review   Current issues with None Identified      Family Dynamics   Good Support System? Yes   wife,     Barriers   Psychosocial barriers to participate in program There are no identifiable barriers or psychosocial needs.      Screening Interventions   Interventions Encouraged to  exercise    Expected Outcomes Short Term goal: Utilizing psychosocial counselor, staff and physician to assist with identification of specific Stressors or current issues interfering with healing process. Setting desired goal for each stressor or current issue identified.;Long Term Goal: Stressors or current issues are controlled or eliminated.;Short Term goal: Identification and review with participant of any Quality of Life or Depression concerns found by scoring the questionnaire.;Long Term goal: The participant improves quality of Life and PHQ9 Scores as seen by post scores and/or verbalization of changes             Quality of Life Scores:   Quality of Life - 02/22/22 1556       Quality of Life   Select Quality of Life      Quality of Life Scores   Health/Function Pre 8.3 %    Socioeconomic Pre 25.19 %    Psych/Spiritual Pre 22.71 %    Family Pre 30 %    GLOBAL Pre 18.14 %            Scores of 19 and below usually indicate a poorer quality of life in these areas.  A difference of  2-3 points is a clinically meaningful difference.  A difference of 2-3 points in the total score of the Quality of Life Index has been associated with significant improvement in overall quality of life, self-image, physical symptoms, and general health in studies assessing change in quality of life.  PHQ-9: Review Flowsheet        04/09/2022 02/22/2022  Depression screen PHQ 2/9  Decreased Interest 0 0  Down, Depressed, Hopeless 0 1  PHQ - 2 Score 0 1  Altered sleeping 0 2  Tired, decreased energy 3 2  Change in appetite 0 0  Feeling bad or failure about yourself  0 0  Trouble concentrating 0 0  Moving slowly or fidgety/restless 0 1  Suicidal thoughts 0 0  PHQ-9 Score 3 6  Difficult doing work/chores Not difficult at all Not difficult at all   Interpretation of Total Score  Total Score Depression Severity:  1-4 = Minimal depression, 5-9 = Mild depression, 10-14 = Moderate depression, 15-19 = Moderately severe depression, 20-27 = Severe depression   Psychosocial Evaluation and Intervention:  Psychosocial Evaluation - 02/12/22 1554       Psychosocial Evaluation & Interventions   Comments Gloria has no barriers to attending the program. He lives with his wife and she is his support. He wants to get healthy again and return to his ability to [place] yard s and weed the garden. 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 The threshold has been decreased to about 150 beats as of last month when he had several episodes of VTach. 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 He has had several falls, from the neuropathy and from dizzy spells.  He understands that we will not have him on the treadmill to start. He is ready to get started    Expected Outcomes STG Fin attends all scheduled sessions, he is able to progress with his exercise to return to his lawn chores without too much effort. LTG Kareem continues to progress with his day to day activity.    Continue Psychosocial Services  Follow up required by staff             Psychosocial Re-Evaluation:  Psychosocial Re-Evaluation     [place] Name 04/09/22 1437 05/07/22 1409  Psychosocial Re-Evaluation   Current issues with Current Stress Concerns;Current Sleep Concerns None Identified      Comments Gadge states that he has  been frustrated because he feels he is not as healthy as used to be. He is motivated to work hard to get back to feeling healthy and fit. He also states that exercise is a good stress reliever for him. He states that his wife, son, and friends are a good support system for him and keep him motivated. He states that he is having some issues with his sleep due to some medication changes. Shakim denies any current stressors at this time. He states that exercise has been a good stress reliever for him, but he does get frustrated when he is not performing as well as he would like during exercise. He states that he has a great support system around him made up mainly by his wife, but also his children and grandchildren. He also reports that after speaking with his doctor and changing some of his medications he is sleeping much better.      Expected Outcomes Short: Continue to attend rehab for stress relief. Long: Maintain positive outlook. Short: Continue to attend rehab for stress relief. Long: Maintain positive outlook.      Interventions -- Encouraged to attend Cardiac Rehabilitation for the exercise      Continue Psychosocial Services  Follow up required by staff Follow up required by staff        Initial Review   Source of Stress Concerns -- None Identified               Psychosocial Discharge (Final Psychosocial Re-Evaluation):  Psychosocial Re-Evaluation - 05/07/22 1409       Psychosocial Re-Evaluation   Current issues with None Identified    Comments Wren denies any current stressors at this time. He states that exercise has been a good stress reliever for him, but he does get frustrated when he is not performing as well as he would like during exercise. He states that he has a great support system around him made up mainly by his wife, but also his children and grandchildren. He also reports that after speaking with his doctor and changing some of his medications he is sleeping much  better.    Expected Outcomes Short: Continue to attend rehab for stress relief. Long: Maintain positive outlook.    Interventions Encouraged to attend Cardiac Rehabilitation for the exercise    Continue Psychosocial Services  Follow up required by staff      Initial Review   Source of Stress Concerns None Identified             Vocational Rehabilitation: Provide vocational rehab assistance to qualifying candidates.   Vocational Rehab Evaluation & Intervention:   Education: Education Goals: Education classes will be provided on a variety of topics geared toward better understanding of heart health and risk factor modification. Participant will state understanding/return demonstration of topics presented as noted by education test scores.  Learning Barriers/Preferences:   General Cardiac Education Topics:  AED/CPR: - Group verbal and written instruction with the use of models to demonstrate the basic use of the AED with the basic ABC's of resuscitation.   Anatomy and Cardiac Procedures: - Group verbal and visual presentation and models provide information about basic cardiac anatomy and function. Reviews the testing methods done to diagnose heart disease and the outcomes of the test results. Describes the treatment choices: Medical Management, Angioplasty, or Coronary Bypass Surgery  for treating various heart conditions including Myocardial Infarction, Angina, Valve Disease, and Cardiac Arrhythmias.  Written material given at graduation. Flowsheet Row Cardiac Rehab from 04/18/2022 in [facility] Cardiac and Pulmonary Rehab  Education need identified 02/22/22       Medication Safety: - Group verbal and visual instruction to review commonly prescribed medications for heart and lung disease. Reviews the medication, class of the drug, and side effects. Includes the steps to properly store meds and maintain the prescription regimen.  Written material given at graduation.   Intimacy: -  Group verbal instruction through game format to discuss how heart and lung disease can affect sexual intimacy. Written material given at graduation..   Know Your Numbers and Heart Failure: - Group verbal and visual instruction to discuss disease risk factors for cardiac and pulmonary disease and treatment options.  Reviews associated critical values for Overweight/Obesity, Hypertension, Cholesterol, and Diabetes.  Discusses basics of heart failure: signs/symptoms and treatments.  Introduces Heart Failure Zone chart for action plan for heart failure.  Written material given at graduation. Flowsheet Row Cardiac Rehab from 04/18/2022 in [facility] Cardiac and Pulmonary Rehab  Education need identified 02/22/22  Date 04/18/22  Educator SB  Instruction Review Code 1- Verbalizes Understanding       Infection Prevention: - Provides verbal and written material to individual with discussion of infection control including proper hand washing and proper equipment cleaning during exercise session. Flowsheet Row Cardiac Rehab from 04/18/2022 in [facility] Cardiac and Pulmonary Rehab  Education need identified 02/22/22  Date 02/22/22  Educator [place]  Instruction Review Code 1- Verbalizes Understanding       Falls Prevention: - Provides verbal and written material to individual with discussion of falls prevention and safety. Flowsheet Row Cardiac Rehab from 02/12/2022 in [facility] Cardiac and Pulmonary Rehab  Date 02/12/22  Educator SB  Instruction Review Code 1- Verbalizes Understanding       Other: -Provides group and verbal instruction on various topics (see comments)   Knowledge Questionnaire Score:  Knowledge Questionnaire Score - 02/22/22 1556       Knowledge Questionnaire Score   Pre Score 20/26             Core Components/Risk Factors/Patient Goals at Admission:  Personal Goals and Risk Factors at Admission - 02/22/22 1635       Core Components/Risk Factors/Patient Goals on Admission     Weight Management Yes;Weight Maintenance    Intervention Weight Management: Develop a combined nutrition and exercise program designed to reach desired caloric intake, while maintaining appropriate intake of nutrient and fiber, sodium and fats, and appropriate energy expenditure required for the weight goal.;Weight Management: Provide education and appropriate resources to help participant work on and attain dietary goals.;Weight Management/Obesity: Establish reasonable short term and long term weight goals.    Admit Weight 164 lb (74.4 kg)    Goal Weight: Short Term 164 lb (74.4 kg)    Goal Weight: Long Term 164 lb (74.4 kg)    Expected Outcomes Short Term: Continue to assess and modify interventions until short term weight is achieved;Long Term: Adherence to nutrition and physical activity/exercise program aimed toward attainment of established weight goal;Weight Maintenance: Understanding of the daily nutrition guidelines, which includes 25-35% calories from fat, 7% or less cal from saturated fats, less than 269m cholesterol, less than 1.5gm of sodium, & 5 or more servings of fruits and vegetables daily;Understanding recommendations for meals to include 15-35% energy as protein, 25-35% energy from fat, 35-60% energy from carbohydrates, less than  226m of dietary cholesterol, 20-35 gm of total fiber daily;Understanding of distribution of calorie intake throughout the day with the consumption of 4-5 meals/snacks    Heart Failure Yes    Intervention Provide a combined exercise and nutrition program that is supplemented with education, support and counseling about heart failure. Directed toward relieving symptoms such as shortness of breath, decreased exercise tolerance, and extremity edema.    Expected Outcomes Improve functional capacity of life;Short term: Attendance in program 2-3 days a week with increased exercise capacity. Reported lower sodium intake. Reported increased fruit and vegetable intake.  Reports medication compliance.;Short term: Daily weights obtained and reported for increase. Utilizing diuretic protocols set by physician.;Long term: Adoption of self-care skills and reduction of barriers for early signs and symptoms recognition and intervention leading to self-care maintenance.    Hypertension Yes    Intervention Provide education on lifestyle modifcations including regular physical activity/exercise, weight management, moderate sodium restriction and increased consumption of fresh fruit, vegetables, and low fat dairy, alcohol moderation, and smoking cessation.;Monitor prescription use compliance.    Expected Outcomes Short Term: Continued assessment and intervention until BP is < 140/973mHG in hypertensive participants. < 130/8033mG in hypertensive participants with diabetes, heart failure or chronic kidney disease.;Long Term: Maintenance of blood pressure at goal levels.    Lipids Yes    Intervention Provide education and support for participant on nutrition & aerobic/resistive exercise along with prescribed medications to achieve LDL <43m41mDL >40mg1m Expected Outcomes Short Term: Participant states understanding of desired cholesterol values and is compliant with medications prescribed. Participant is following exercise prescription and nutrition guidelines.;Long Term: Cholesterol controlled with medications as prescribed, with individualized exercise RX and with personalized nutrition plan. Value goals: LDL < 43mg,78m > 40 mg.             Education:Diabetes - Individual verbal and written instruction to review signs/symptoms of diabetes, desired ranges of glucose level fasting, after meals and with exercise. Acknowledge that pre and post exercise glucose checks will be done for 3 sessions at entry of program.   Core Components/Risk Factors/Patient Goals Review:   Goals and Risk Factor Review     Row Name 04/09/22 1445 05/07/22 1413           Core  Components/Risk Factors/Patient Goals Review   Personal Goals Review Weight Management/Obesity;Hypertension Weight Management/Obesity;Hypertension      Review Glendal would like to maintain a weight of 165-170 lbs. He has been weighing himself everyday and states that he has been staying between 160-170 lbs. He also has been checking his BP every day and states that it has been staying within normal ranges. He was encouraged to continue to check both his weight and BP every day. Sajjad is content with his weight being at 165-170 lbs. He would like to maintain this weight through healthy eating habits and exercise. He has been checking his BP at home everyday, and states that his BP is staying within normal ranges. 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      Expected Outcomes Short: Continue to check BP at home. Long: Continue to work towards weight goal. Short: Continue to check BP at home. Long: Continue to monitor lifestyle risk factors.               Core Components/Risk Factors/Patient Goals at Discharge (Final Review):   Goals and Risk Factor Review -  05/07/22 1413       Core Components/Risk Factors/Patient Goals Review   Personal Goals Review Weight Management/Obesity;Hypertension    Review Aarit is content with his weight being at 165-170 lbs. He would like to maintain this weight through healthy eating habits and exercise. He has been checking his BP at home everyday, and states that his BP is staying within normal ranges. 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    Expected Outcomes Short: Continue to check BP at home. Long: Continue to monitor lifestyle risk factors.             ITP Comments:  ITP Comments     Row Name 02/12/22 1600 02/22/22 1554 02/26/22 1404 03/20/22 1134 03/21/22 1035   ITP Comments  Virtual orientation call completed today. he has an appointment on Date: 02/22/2022  for EP eval and gym Orientation.  Documentation of diagnosis can be found in [facility] records Date: 02/22/2022 . Completed 6MWT and gym orientation. Initial ITP created and sent for review to Dr. Emily Filbert, Medical Director. First full day of exercise!  Patient was oriented to gym and equipment including functions, settings, policies, and procedures.  Patient's individual exercise prescription and treatment plan were reviewed.  All starting workloads were established based on the results of the 6 minute walk test done at initial orientation visit.  The plan for exercise progression was also introduced and progression will be customized based on patient's performance and goals. Shrihan has not been to rehab since 02/26/22 which was his 1st day of exercise. 30 Day review completed. Medical Director ITP review done, changes made as directed, and signed approval by Medical Director.   New out on medical leave    [place] Name 04/04/22 1452 04/18/22 1000 05/16/22 1351       ITP Comments Pt informed of 4.1 lb weight gain since class 2 days ago.. Pt states that he is traying to gain weight; is on diuretic. He is monitoring. 30 Day review completed. Medical Director ITP review done, changes made as directed, and signed approval by Medical Director. 30 Day review completed. Medical Director ITP review done, changes made as directed, and signed approval by Medical Director.              Comments:

## 2022-05-23 ENCOUNTER — Encounter: Payer: No Typology Code available for payment source | Attending: Internal Medicine | Admitting: *Deleted

## 2022-05-23 DIAGNOSIS — I5022 Chronic systolic (congestive) heart failure: Secondary | ICD-10-CM | POA: Insufficient documentation

## 2022-05-23 NOTE — Progress Notes (Signed)
Daily Session Note  Patient Details  Name: Danny Marquez MRN: [id number] Date of Birth: [birth year] Referring Provider:   Flowsheet Row Cardiac Rehab from 02/22/2022 in [facility] Cardiac and Pulmonary Rehab  Referring Provider Garth Schlatter MD       Encounter Date: 05/23/2022  Check In:  Session Check In - 05/23/22 1438       Check-In   Supervising physician immediately available to respond to emergencies See telemetry face sheet for immediately available ER MD    Location [facility]-Cardiac & Pulmonary Rehab    Staff Present Renita Papa, RN BSN;Joseph Tessie Fass, RCP,RRT,BSRT;Noah Chauvin, [place], Exercise Physiologist    Virtual Visit No    Medication changes reported     No    Fall or balance concerns reported    No    Warm-up and Cool-down Performed on first and last piece of equipment    Resistance Training Performed Yes    VAD Patient? No    PAD/SET Patient? No      Pain Assessment   Currently in Pain? No/denies                Social History   Tobacco Use  Smoking Status Former   Years: 15.00   Types: Cigarettes  Smokeless Tobacco Never    Goals Met:  Independence with exercise equipment Exercise tolerated well No report of concerns or symptoms today Strength training completed today  Goals Unmet:  Not Applicable  Comments: Pt able to follow exercise prescription today without complaint.  Will continue to monitor for progression.    Dr. Emily Filbert is Medical Director for [facility].  Dr. Ottie Glazier is Medical Director for [facility] Pulmonary Rehabilitation.

## 2022-05-24 HISTORY — PX: V TACH ABLATION: EP1227

## 2022-05-31 HISTORY — PX: OTHER SURGICAL HISTORY: SHX169

## 2022-06-01 ENCOUNTER — Telehealth: Payer: Self-pay

## 2022-06-01 NOTE — Telephone Encounter (Signed)
Francis wife called to inform staff that he has had an ablation and will not return to rehab for 2 weeks.

## 2022-06-07 DIAGNOSIS — I5022 Chronic systolic (congestive) heart failure: Secondary | ICD-10-CM

## 2022-06-11 ENCOUNTER — Encounter: Payer: No Typology Code available for payment source | Attending: Internal Medicine | Admitting: *Deleted

## 2022-06-11 DIAGNOSIS — I5022 Chronic systolic (congestive) heart failure: Secondary | ICD-10-CM | POA: Diagnosis present

## 2022-06-11 NOTE — Progress Notes (Signed)
Daily Session Note  Patient Details  Name: Danny Marquez MRN: [id number] Date of Birth: 09-30-1945 Referring Provider:   Flowsheet Row Cardiac Rehab from 02/22/2022 in [facility] Cardiac and Pulmonary Rehab  Referring Provider Garth Schlatter MD       Encounter Date: 06/11/2022  Check In:  Session Check In - 06/11/22 1410       Check-In   Supervising physician immediately available to respond to emergencies See telemetry face sheet for immediately available ER MD    Location [facility]-Cardiac & Pulmonary Rehab    Staff Present Justin Mend, Sharren Bridge, MS, ASCM CEP, Exercise Physiologist;Reeves Musick Sherryll Burger, RN BSN    Virtual Visit No    Medication changes reported     No    Fall or balance concerns reported    No    Tobacco Cessation No Change    Warm-up and Cool-down Performed on first and last piece of equipment    Resistance Training Performed Yes    VAD Patient? No    PAD/SET Patient? No      Pain Assessment   Currently in Pain? No/denies                Social History   Tobacco Use  Smoking Status Former   Years: 15.00   Types: Cigarettes  Smokeless Tobacco Never    Goals Met:  Independence with exercise equipment Exercise tolerated well No report of concerns or symptoms today Strength training completed today  Goals Unmet:  Not Applicable  Comments: Pt able to follow exercise prescription today without complaint.  Will continue to monitor for progression.    Dr. Emily Filbert is Medical Director for [facility].  Dr. Ottie Glazier is Medical Director for [facility] Pulmonary Rehabilitation.

## 2022-06-13 ENCOUNTER — Encounter: Payer: No Typology Code available for payment source | Admitting: *Deleted

## 2022-06-13 ENCOUNTER — Encounter: Payer: Self-pay | Admitting: *Deleted

## 2022-06-13 DIAGNOSIS — I5022 Chronic systolic (congestive) heart failure: Secondary | ICD-10-CM | POA: Diagnosis not present

## 2022-06-13 NOTE — Progress Notes (Signed)
Cardiac Individual Treatment Plan  Patient Details  Name: Danny Marquez MRN: [id number] Date of Birth: May 29, 1946 Referring Provider:   Flowsheet Row Cardiac Rehab from 02/22/2022 in [facility] Re[facility]  Referring Provider Garth Schlatter MD       Initial Encounter Date:  Flowsheet Row Cardiac Rehab from 02/22/2022 in [facility] Cardiac and Pulmonary Rehab  Date 02/22/22       Visit Diagnosis: Heart failure, chronic systolic ([place])  Patient's Home Medications on Admission:  Current Outpatient Medications:    allopurinol (ZYLOPRIM) 100 MG tablet, Take 200 mg by mouth daily., Disp: , Rfl:    amiodarone (PACERONE) 200 MG tablet, Take 100 mg by mouth 2 (two) times daily., Disp: , Rfl:    apixaban (ELIQUIS) 5 MG TABS tablet, Take 5 mg by mouth 2 (two) times daily., Disp: , Rfl:    apixaban (ELIQUIS) 5 MG TABS tablet, Take 5 mg by mouth 2 (two) times daily., Disp: , Rfl:    cefdinir (OMNICEF) 300 MG capsule, Take 1 capsule (300 mg total) by mouth 2 (two) times daily., Disp: 20 capsule, Rfl: 0   DONEPEZIL HCL PO, Take 10 mg by mouth., Disp: , Rfl:    empagliflozin (JARDIANCE) 25 MG TABS tablet, Take 12.5 mg by mouth daily., Disp: , Rfl:    HYDROcodone-acetaminophen (NORCO/VICODIN) 5-325 MG tablet, Take 1 tablet by mouth every 6 (six) hours as needed., Disp: 10 tablet, Rfl: 0   pregabalin (LYRICA) 50 MG capsule, Take 50 mg in the morning, 50 mg in the afternoon, and 100 mg at night, Disp: , Rfl:    ranolazine (RANEXA) 500 MG 12 hr tablet, Take 500 mg by mouth 2 (two) times daily., Disp: , Rfl:    rosuvastatin (CRESTOR) 5 MG tablet, , Disp: , Rfl:    sertraline (ZOLOFT) 100 MG tablet, Take 100 mg by mouth daily. , Disp: , Rfl:    spironolactone (ALDACTONE) 25 MG tablet, Take 25 mg by mouth daily., Disp: , Rfl:    torsemide (DEMADEX) 20 MG tablet, Take 20 mg by mouth daily., Disp: , Rfl:    TRAZODONE HCL PO, Take 75 mg by mouth., Disp: , Rfl:   Past Medical History: Past  Medical History:  Diagnosis Date   A-fib ([place])    CHF (congestive heart failure) (HCC)    EF 30%   Chronic kidney disease    congenital one kidney   Hypertension    Myocardial infarction ([place])    Solitary kidney     Tobacco Use: Social History   Tobacco Use  Smoking Status Former   Years: 15.00   Types: Cigarettes  Smokeless Tobacco Never    Labs: Review Flowsheet        No data to display           Exercise Target Goals: Exercise Program Goal: Individual exercise prescription set using results from initial 6 min walk test and THRR while considering  patient's activity barriers and safety.   Exercise Prescription Goal: Initial exercise prescription builds to 30-45 minutes a day of aerobic activity, 2-3 days per week.  Home exercise guidelines will be given to patient during program as part of exercise prescription that the participant will acknowledge.   Education: Aerobic Exercise: - Group verbal and visual presentation on the components of exercise prescription. Introduces F.I.T.T principle from ACSM for exercise prescriptions.  Reviews F.I.T.T. principles of aerobic exercise including progression. Written material given at graduation. Flowsheet Row Cardiac Rehab from 04/18/2022 in [facility] Cardiac  and Pulmonary Rehab  Education need identified 02/22/22       Education: Resistance Exercise: - Group verbal and visual presentation on the components of exercise prescription. Introduces F.I.T.T principle from ACSM for exercise prescriptions  Reviews F.I.T.T. principles of resistance exercise including progression. Written material given at graduation.    Education: Exercise & Equipment Safety: - Individual verbal instruction and demonstration of equipment use and safety with use of the equipment. Flowsheet Row Cardiac Rehab from 04/18/2022 in [facility] Cardiac and Pulmonary Rehab  Education need identified 02/22/22  Date 02/22/22  Educator [place]  Instruction Review Code 1-  Verbalizes Understanding       Education: Exercise Physiology & General Exercise Guidelines: - Group verbal and written instruction with models to review the exercise physiology of the cardiovascular system and associated critical values. Provides general exercise guidelines with specific guidelines to those with heart or lung disease.    Education: Flexibility, Balance, Mind/Body Relaxation: - Group verbal and visual presentation with interactive activity on the components of exercise prescription. Introduces F.I.T.T principle from ACSM for exercise prescriptions. Reviews F.I.T.T. principles of flexibility and balance exercise training including progression. Also discusses the mind body connection.  Reviews various relaxation techniques to help reduce and manage stress (i.e. Deep breathing, progressive muscle relaxation, and visualization). Balance handout provided to take home. Written material given at graduation.   Activity Barriers & Risk Stratification:  Activity Barriers & Cardiac Risk Stratification - 02/22/22 1555       Activity Barriers & Cardiac Risk Stratification   Activity Barriers Balance Concerns;Other (comment);Back Problems;Deconditioning;Right Knee Replacement;Left Knee Replacement;Muscular Weakness;History of Falls;Joint Problems    Comments neuropathy, multiple back surgeries, dizziness    Cardiac Risk Stratification High             6 Minute Walk:  6 Minute Walk     Row Name 02/22/22 1609         6 Minute Walk   Phase Initial     Distance 1020 feet     Walk Time 6 minutes     # of Rest Breaks 0     MPH 1.93     METS 2.07     RPE 10     Perceived Dyspnea  0     VO2 Peak 7.27     Symptoms Yes (comment)     Comments Right knee pain 3/10     Resting HR 62 bpm     Resting BP 98/56     Resting Oxygen Saturation  98 %     Exercise Oxygen Saturation  during 6 min walk 96 %     Max Ex. HR 105 bpm     Max Ex. BP 122/58     2 Minute Post BP 108/60               Oxygen Initial Assessment:   Oxygen Re-Evaluation:   Oxygen Discharge (Final Oxygen Re-Evaluation):   Initial Exercise Prescription:  Initial Exercise Prescription - 02/22/22 1600       Date of Initial Exercise RX and Referring Provider   Date 02/22/22    Referring Provider Garth Schlatter MD      Oxygen   Maintain Oxygen Saturation 88% or higher      Recumbant Bike   Level 1    RPM 60    Watts 10    Minutes 15    METs 2      NuStep   Level 1    SPM 80  Minutes 15    METs 2      Biostep-RELP   Level 1    SPM 50    Minutes 15    METs 2      Track   Laps 14    Minutes 15    METs 1.7      Prescription Details   Frequency (times per week) 3    Duration Progress to 30 minutes of continuous aerobic without signs/symptoms of physical distress      Intensity   THRR 40-80% of Max Heartrate 94- 127    Ratings of Perceived Exertion 11-13    Perceived Dyspnea 0-4      Progression   Progression Continue to progress workloads to maintain intensity without signs/symptoms of physical distress.      Resistance Training   Training Prescription Yes    Weight 4 lb    Reps 10-15             Perform Capillary Blood Glucose checks as needed.  Exercise Prescription Changes:   Exercise Prescription Changes     Row Name 02/22/22 1600 04/11/22 1200 04/24/22 1500 05/07/22 1500       Response to Exercise   Blood Pressure (Admit) 98/56 122/62 100/60 102/56    Blood Pressure (Exercise) 122/58 128/66 110/62 122/60    Blood Pressure (Exit) 108/60 118/60 102/62 108/58    Heart Rate (Admit) 62 bpm 78 bpm 84 bpm 97 bpm    Heart Rate (Exercise) 105 bpm 116 bpm 100 bpm 94 bpm    Heart Rate (Exit) 64 bpm 69 bpm 88 bpm 93 bpm    Oxygen Saturation (Admit) 98 % -- -- --    Oxygen Saturation (Exercise) 96 % -- -- --    Oxygen Saturation (Exit) 96 % -- -- --    Rating of Perceived Exertion (Exercise) _0 Perceived Dyspnea (Exercise) 0 -- --  --    Symptoms Right knee pain 3/10 none none none    Comments walk test results 1st full day of exercise run of vtach- asymptomatic none    Duration -- Progress to 30 minutes of  aerobic without signs/symptoms of physical distress Continue with 30 min of aerobic exercise without signs/symptoms of physical distress. Continue with 30 min of aerobic exercise without signs/symptoms of physical distress.    Intensity -- THRR unchanged THRR unchanged THRR unchanged      Progression   Progression -- Continue to progress workloads to maintain intensity without signs/symptoms of physical distress. Continue to progress workloads to maintain intensity without signs/symptoms of physical distress. Continue to progress workloads to maintain intensity without signs/symptoms of physical distress.    Average METs -- 1.82 1.92 2.81      Resistance Training   Training Prescription -- Yes Yes Yes    Weight -- 4 lb 4 lb 4 lb    Reps -- 10-15 10-15 10-15      Interval Training   Interval Training -- -- No No      Recumbant Bike   Level -- -- 2 2    Watts -- -- -- 16    Minutes -- -- 15 15    METs -- -- 2.42 2.63      NuStep   Level -- 3 -- --    Minutes -- 15 -- --    METs -- 2.5 -- --      T5 Nustep   Level -- -- 1 --  Minutes -- -- 15 --      Biostep-RELP   Level -- _0 Minutes -- _1 METs -- _2 Track   Laps -- -- 14 --    Minutes -- -- 15 --    METs -- -- 1.76 --      Home Exercise Plan   Plans to continue exercise at -- -- -- Home (comment)  walking    Frequency -- -- -- Add 2 additional days to program exercise sessions.    Initial Home Exercises Provided -- -- -- 05/07/22      Oxygen   Maintain Oxygen Saturation -- 88% or higher 88% or higher 88% or higher             Exercise Comments:   Exercise Comments     Row Name 02/26/22 1404           Exercise Comments First full day of exercise!  Patient was oriented to gym and equipment including  functions, settings, policies, and procedures.  Patient's individual exercise prescription and treatment plan were reviewed.  All starting workloads were established based on the results of the 6 minute walk test done at initial orientation visit.  The plan for exercise progression was also introduced and progression will be customized based on patient's performance and goals.                Exercise Goals and Review:   Exercise Goals     Row Name 02/22/22 1634             Exercise Goals   Increase Physical Activity Yes       Intervention Provide advice, education, support and counseling about physical activity/exercise needs.;Develop an individualized exercise prescription for aerobic and resistive training based on initial evaluation findings, risk stratification, comorbidities and participant's personal goals.       Expected Outcomes Short Term: Attend rehab on a regular basis to increase amount of physical activity.;Long Term: Add in home exercise to make exercise part of routine and to increase amount of physical activity.;Long Term: Exercising regularly at least 3-5 days a week.       Increase Strength and Stamina Yes       Intervention Provide advice, education, support and counseling about physical activity/exercise needs.;Develop an individualized exercise prescription for aerobic and resistive training based on initial evaluation findings, risk stratification, comorbidities and participant's personal goals.       Expected Outcomes Short Term: Increase workloads from initial exercise prescription for resistance, speed, and METs.;Short Term: Perform resistance training exercises routinely during rehab and add in resistance training at home;Long Term: Improve cardiorespiratory fitness, muscular endurance and strength as measured by increased METs and functional capacity (6MWT)       Able to understand and use rate of perceived exertion (RPE) scale Yes       Intervention Provide  education and explanation on how to use RPE scale       Expected Outcomes Short Term: Able to use RPE daily in rehab to express subjective intensity level;Long Term:  Able to use RPE to guide intensity level when exercising independently       Able to understand and use Dyspnea scale Yes       Intervention Provide education and explanation on how to use Dyspnea scale       Expected Outcomes Short Term: Able to use Dyspnea scale daily in  rehab to express subjective sense of shortness of breath during exertion;Long Term: Able to use Dyspnea scale to guide intensity level when exercising independently       Knowledge and understanding of Target Heart Rate Range (THRR) Yes       Intervention Provide education and explanation of THRR including how the numbers were predicted and where they are located for reference       Expected Outcomes Short Term: Able to state/look up THRR;Long Term: Able to use THRR to govern intensity when exercising independently;Short Term: Able to use daily as guideline for intensity in rehab       Able to check pulse independently Yes       Intervention Provide education and demonstration on how to check pulse in carotid and radial arteries.;Review the importance of being able to check your own pulse for safety during independent exercise       Expected Outcomes Short Term: Able to explain why pulse checking is important during independent exercise;Long Term: Able to check pulse independently and accurately       Understanding of Exercise Prescription Yes       Intervention Provide education, explanation, and written materials on patient's individual exercise prescription       Expected Outcomes Short Term: Able to explain program exercise prescription;Long Term: Able to explain home exercise prescription to exercise independently                Exercise Goals Re-Evaluation :  Exercise Goals Re-Evaluation     Row Name 02/26/22 1404 04/09/22 1430 04/11/22 1204 04/24/22  1509 05/07/22 1430     Exercise Goal Re-Evaluation   Exercise Goals Review Increase Physical Activity;Able to understand and use rate of perceived exertion (RPE) scale;Understanding of Exercise Prescription;Knowledge and understanding of Target Heart Rate Range (THRR);Increase Strength and Stamina;Able to understand and use Dyspnea scale;Able to check pulse independently Increase Physical Activity;Understanding of Exercise Prescription;Increase Strength and Stamina Increase Physical Activity;Understanding of Exercise Prescription;Increase Strength and Stamina Increase Physical Activity;Understanding of Exercise Prescription;Increase Strength and Stamina Increase Physical Activity;Understanding of Exercise Prescription;Increase Strength and Stamina   Comments Reviewed RPE and dyspnea scales, THR and program prescription with pt today.  Pt voiced understanding and was given a copy of goals to take home. Melo is really enjoying the program. He wants to return to a more active lifestyle and increase his strength and stamina. He states that he is currently doing some bodyweight exercises at home given to hime by [employer]. He wants to return to being able to do the yard work he likes to do. He states that he is walking at least a mile everyday as well for endurance training. Markus is off to a good start in rehab. He had an average overall MET level of 1.82 METs. He also got up to level 3 on the T4. He tolerated using 4 lb hand weights for resistance training as well. We will continue to monitor his progress in the program. [place] has been coming to rehab pretty consistently. He did have several runs of non-sustained v tach episodes dated on 8/2 where he was completely asymptomatic. He saw his EP doctor and they did an interrogation and lowered his rate. Other than that, he has been walking the track averaging around 14 laps. Biostep level has increased to 3. We will slowly progress based on how his rhythm  looks and how he feels. Per his wife, he is supposed to be getting a ventricular ablation in September, where we  will need to place him on medical hold with return with clearance. We will continue to monitor. Reviewed home exercise with pt today.  Pt plans to walk around his neighborhood for exercise. He will also use his personal hand weights for resistance training at home. Emphasized exercising with supervision. Reviewed THR, pulse, RPE, sign and symptoms, pulse oximetery and when to call 911 or MD.  Also discussed weather considerations and indoor options.  Pt voiced understanding.   Expected Outcomes Short: Use RPE daily to regulate intensity. Long: Follow program prescription in THR. Short: Continue to walk at home. Long: Continue to improve strength and stamina. Short: Continue to attend rehab and follow exercise prescription. Long: Continue to improve overall MET levels. Short: Watch rhythm and report any symptoms, continue walking on the track Long: Continue to increase overall strength and stamina Short: Begin walking at home on days away from rehab. Long: Continue to increase overall strength and stamina.    [place] Name 06/07/22 0813             Exercise Goal Re-Evaluation   Exercise Goals Review Increase Physical Activity;Understanding of Exercise Prescription;Increase Strength and Stamina       Comments Patient has not been here since last review as he had a VT ablation complete. Patient should be cleared and will return to rehab in a couple of weeks.       Expected Outcomes Short:Clear and return to rehab Long: Complete [facility] Program                Discharge Exercise Prescription (Final Exercise Prescription Changes):  Exercise Prescription Changes - 05/07/22 1500       Response to Exercise   Blood Pressure (Admit) 102/56    Blood Pressure (Exercise) 122/60    Blood Pressure (Exit) 108/58    Heart Rate (Admit) 97 bpm    Heart Rate (Exercise) 94 bpm    Heart Rate (Exit)  93 bpm    Rating of Perceived Exertion (Exercise) 12    Symptoms none    Comments none    Duration Continue with 30 min of aerobic exercise without signs/symptoms of physical distress.    Intensity THRR unchanged      Progression   Progression Continue to progress workloads to maintain intensity without signs/symptoms of physical distress.    Average METs 2.81      Resistance Training   Training Prescription Yes    Weight 4 lb    Reps 10-15      Interval Training   Interval Training No      Recumbant Bike   Level 2    Watts 16    Minutes 15    METs 2.63      Biostep-RELP   Level 2    Minutes 15    METs 3      Home Exercise Plan   Plans to continue exercise at Home (comment)   walking   Frequency Add 2 additional days to program exercise sessions.    Initial Home Exercises Provided 05/07/22      Oxygen   Maintain Oxygen Saturation 88% or higher             Nutrition:  Target Goals: Understanding of nutrition guidelines, daily intake of sodium '1500mg'$ , cholesterol '200mg'$ , calories 30% from fat and 7% or less from saturated fats, daily to have 5 or more servings of fruits and vegetables.  Education: All About Nutrition: -Group instruction provided by verbal, written material, interactive activities, discussions,  models, and posters to present general guidelines for heart healthy nutrition including fat, fiber, MyPlate, the role of sodium in heart healthy nutrition, utilization of the nutrition label, and utilization of this knowledge for meal planning. Follow up email sent as well. Written material given at graduation. Flowsheet Row Cardiac Rehab from 04/18/2022 in [facility] Cardiac and Pulmonary Rehab  Education need identified 02/22/22       Biometrics:  Pre Biometrics - 02/22/22 1554       Pre Biometrics   Height 5' 5.75" (1.67 m)    Weight 164 lb 11.2 oz (74.7 kg)    BMI (Calculated) 26.79    Single Leg Stand 0 seconds              Nutrition Therapy  Plan and Nutrition Goals:  Nutrition Therapy & Goals - 04/23/22 1507       Nutrition Therapy   RD appointment deferred Yes   Pt would like to defer speaking about nutrition, but may want to in the future. Will continue to follow up.     Personal Nutrition Goals   Nutrition Goal Pt would like to defer speaking about nutrition, but may want to in the future. Will continue to follow up.             Nutrition Assessments:  MEDIFICTS Score Key: ?70 Need to make dietary changes  40-70 Heart Healthy Diet ? 40 Therapeutic Level Cholesterol Diet  Flowsheet Row Cardiac Rehab from 02/22/2022 in [facility] Cardiac and Pulmonary Rehab  Picture Your Plate Total Score on Admission 62      Picture Your Plate Scores: <23 Unhealthy dietary pattern with much room for improvement. 41-50 Dietary pattern unlikely to meet recommendations for good health and room for improvement. 51-60 More healthful dietary pattern, with some room for improvement.  >60 Healthy dietary pattern, although there may be some specific behaviors that could be improved.    Nutrition Goals Re-Evaluation:  Nutrition Goals Re-Evaluation     [place] Name 04/09/22 1443             Goals   Comment Aldyn does not want to meet with the RD at this time, but would be open to speaking with her in the future.       Expected Outcome Short: Plan to meet with RD Long: Schedule meeting with RD                Nutrition Goals Discharge (Final Nutrition Goals Re-Evaluation):  Nutrition Goals Re-Evaluation - 04/09/22 1443       Goals   Comment Ediberto does not want to meet with the RD at this time, but would be open to speaking with her in the future.    Expected Outcome Short: Plan to meet with RD Long: Schedule meeting with RD             Psychosocial: Target Goals: Acknowledge presence or absence of significant depression and/or stress, maximize coping skills, provide positive support system. Participant is able to  verbalize types and ability to use techniques and skills needed for reducing stress and depression.   Education: Stress, Anxiety, and Depression - Group verbal and visual presentation to define topics covered.  Reviews how body is impacted by stress, anxiety, and depression.  Also discusses healthy ways to reduce stress and to treat/manage anxiety and depression.  Written material given at graduation.   Education: Sleep Hygiene -Provides group verbal and written instruction about how sleep can affect your health.  Define sleep  hygiene, discuss sleep cycles and impact of sleep habits. Review good sleep hygiene tips.    Initial Review & Psychosocial Screening:  Initial Psych Review & Screening - 02/12/22 1533       Initial Review   Current issues with None Identified      Family Dynamics   Good Support System? Yes   wife,     Barriers   Psychosocial barriers to participate in program There are no identifiable barriers or psychosocial needs.      Screening Interventions   Interventions Encouraged to exercise    Expected Outcomes Short Term goal: Utilizing psychosocial counselor, staff and physician to assist with identification of specific Stressors or current issues interfering with healing process. Setting desired goal for each stressor or current issue identified.;Long Term Goal: Stressors or current issues are controlled or eliminated.;Short Term goal: Identification and review with participant of any Quality of Life or Depression concerns found by scoring the questionnaire.;Long Term goal: The participant improves quality of Life and PHQ9 Scores as seen by post scores and/or verbalization of changes             Quality of Life Scores:   Quality of Life - 02/22/22 1556       Quality of Life   Select Quality of Life      Quality of Life Scores   Health/Function Pre 8.3 %    Socioeconomic Pre 25.19 %    Psych/Spiritual Pre 22.71 %    Family Pre 30 %    GLOBAL Pre 18.14 %             Scores of 19 and below usually indicate a poorer quality of life in these areas.  A difference of  2-3 points is a clinically meaningful difference.  A difference of 2-3 points in the total score of the Quality of Life Index has been associated with significant improvement in overall quality of life, self-image, physical symptoms, and general health in studies assessing change in quality of life.  PHQ-9: Review Flowsheet       04/09/2022 02/22/2022  Depression screen PHQ 2/9  Decreased Interest 0 0  Down, Depressed, Hopeless 0 1  PHQ - 2 Score 0 1  Altered sleeping 0 2  Tired, decreased energy 3 2  Change in appetite 0 0  Feeling bad or failure about yourself  0 0  Trouble concentrating 0 0  Moving slowly or fidgety/restless 0 1  Suicidal thoughts 0 0  PHQ-9 Score 3 6  Difficult doing work/chores Not difficult at all Not difficult at all   Interpretation of Total Score  Total Score Depression Severity:  1-4 = Minimal depression, 5-9 = Mild depression, 10-14 = Moderate depression, 15-19 = Moderately severe depression, 20-27 = Severe depression   Psychosocial Evaluation and Intervention:  Psychosocial Evaluation - 02/12/22 1554       Psychosocial Evaluation & Interventions   Comments Zeno has no barriers to attending the program. He lives with his wife and she is his support. He wants to get healthy again and return to his ability to [place] yard s and weed the garden. 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 The threshold has been decreased to about 150 beats as of last month when he had several episodes of VTach. 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 He has had several falls, from the neuropathy and from dizzy spells.  He understands that we will not  have him on the treadmill to start. He is ready to get started    Expected Outcomes STG Chidiebere attends all scheduled sessions, he is able to progress with his exercise to return to his  lawn chores without too much effort. LTG Almus continues to progress with his day to day activity.    Continue Psychosocial Services  Follow up required by staff             Psychosocial Re-Evaluation:  Psychosocial Re-Evaluation     [place] Name 04/09/22 1437 05/07/22 1409           Psychosocial Re-Evaluation   Current issues with Current Stress Concerns;Current Sleep Concerns None Identified      Comments Ledon states that he has been frustrated because he feels he is not as healthy as used to be. He is motivated to work hard to get back to feeling healthy and fit. He also states that exercise is a good stress reliever for him. He states that his wife, son, and friends are a good support system for him and keep him motivated. He states that he is having some issues with his sleep due to some medication changes. Jayme denies any current stressors at this time. He states that exercise has been a good stress reliever for him, but he does get frustrated when he is not performing as well as he would like during exercise. He states that he has a great support system around him made up mainly by his wife, but also his children and grandchildren. He also reports that after speaking with his doctor and changing some of his medications he is sleeping much better.      Expected Outcomes Short: Continue to attend rehab for stress relief. Long: Maintain positive outlook. Short: Continue to attend rehab for stress relief. Long: Maintain positive outlook.      Interventions -- Encouraged to attend Cardiac Rehabilitation for the exercise      Continue Psychosocial Services  Follow up required by staff Follow up required by staff        Initial Review   Source of Stress Concerns -- None Identified               Psychosocial Discharge (Final Psychosocial Re-Evaluation):  Psychosocial Re-Evaluation - 05/07/22 1409       Psychosocial Re-Evaluation   Current issues with None Identified     Comments Kongmeng denies any current stressors at this time. He states that exercise has been a good stress reliever for him, but he does get frustrated when he is not performing as well as he would like during exercise. He states that he has a great support system around him made up mainly by his wife, but also his children and grandchildren. He also reports that after speaking with his doctor and changing some of his medications he is sleeping much better.    Expected Outcomes Short: Continue to attend rehab for stress relief. Long: Maintain positive outlook.    Interventions Encouraged to attend Cardiac Rehabilitation for the exercise    Continue Psychosocial Services  Follow up required by staff      Initial Review   Source of Stress Concerns None Identified             Vocational Rehabilitation: Provide vocational rehab assistance to qualifying candidates.   Vocational Rehab Evaluation & Intervention:   Education: Education Goals: Education classes will be provided on a variety of topics geared toward better understanding of heart health and  risk factor modification. Participant will state understanding/return demonstration of topics presented as noted by education test scores.  Learning Barriers/Preferences:   General Cardiac Education Topics:  AED/CPR: - Group verbal and written instruction with the use of models to demonstrate the basic use of the AED with the basic ABC's of resuscitation.   Anatomy and Cardiac Procedures: - Group verbal and visual presentation and models provide information about basic cardiac anatomy and function. Reviews the testing methods done to diagnose heart disease and the outcomes of the test results. Describes the treatment choices: Medical Management, Angioplasty, or Coronary Bypass Surgery for treating various heart conditions including Myocardial Infarction, Angina, Valve Disease, and Cardiac Arrhythmias.  Written material given at  graduation. Flowsheet Row Cardiac Rehab from 04/18/2022 in [facility] Cardiac and Pulmonary Rehab  Education need identified 02/22/22       Medication Safety: - Group verbal and visual instruction to review commonly prescribed medications for heart and lung disease. Reviews the medication, class of the drug, and side effects. Includes the steps to properly store meds and maintain the prescription regimen.  Written material given at graduation.   Intimacy: - Group verbal instruction through game format to discuss how heart and lung disease can affect sexual intimacy. Written material given at graduation..   Know Your Numbers and Heart Failure: - Group verbal and visual instruction to discuss disease risk factors for cardiac and pulmonary disease and treatment options.  Reviews associated critical values for Overweight/Obesity, Hypertension, Cholesterol, and Diabetes.  Discusses basics of heart failure: signs/symptoms and treatments.  Introduces Heart Failure Zone chart for action plan for heart failure.  Written material given at graduation. Flowsheet Row Cardiac Rehab from 04/18/2022 in [facility] Cardiac and Pulmonary Rehab  Education need identified 02/22/22  Date 04/18/22  Educator SB  Instruction Review Code 1- Verbalizes Understanding       Infection Prevention: - Provides verbal and written material to individual with discussion of infection control including proper hand washing and proper equipment cleaning during exercise session. Flowsheet Row Cardiac Rehab from 04/18/2022 in [facility] Cardiac and Pulmonary Rehab  Education need identified 02/22/22  Date 02/22/22  Educator KL  Instruction Review Code 1- Verbalizes Understanding       Falls Prevention: - Provides verbal and written material to individual with discussion of falls prevention and safety. Flowsheet Row Cardiac Rehab from 02/12/2022 in [facility] Cardiac and Pulmonary Rehab  Date 02/12/22  Educator SB  Instruction Review Code 1-  Verbalizes Understanding       Other: -Provides group and verbal instruction on various topics (see comments)   Knowledge Questionnaire Score:  Knowledge Questionnaire Score - 02/22/22 1556       Knowledge Questionnaire Score   Pre Score 20/26             Core Components/Risk Factors/Patient Goals at Admission:  Personal Goals and Risk Factors at Admission - 02/22/22 1635       Core Components/Risk Factors/Patient Goals on Admission    Weight Management Yes;Weight Maintenance    Intervention Weight Management: Develop a combined nutrition and exercise program designed to reach desired caloric intake, while maintaining appropriate intake of nutrient and fiber, sodium and fats, and appropriate energy expenditure required for the weight goal.;Weight Management: Provide education and appropriate resources to help participant work on and attain dietary goals.;Weight Management/Obesity: Establish reasonable short term and long term weight goals.    Admit Weight 164 lb (74.4 kg)    Goal Weight: Short Term 164 lb (74.4 kg)  Goal Weight: Long Term 164 lb (74.4 kg)    Expected Outcomes Short Term: Continue to assess and modify interventions until short term weight is achieved;Long Term: Adherence to nutrition and physical activity/exercise program aimed toward attainment of established weight goal;Weight Maintenance: Understanding of the daily nutrition guidelines, which includes 25-35% calories from fat, 7% or less cal from saturated fats, less than $RemoveB'200mg'fruHjwrM$  cholesterol, less than 1.5gm of sodium, & 5 or more servings of fruits and vegetables daily;Understanding recommendations for meals to include 15-35% energy as protein, 25-35% energy from fat, 35-60% energy from carbohydrates, less than $RemoveB'200mg'aynLApXE$  of dietary cholesterol, 20-35 gm of total fiber daily;Understanding of distribution of calorie intake throughout the day with the consumption of 4-5 meals/snacks    Heart Failure Yes    Intervention  Provide a combined exercise and nutrition program that is supplemented with education, support and counseling about heart failure. Directed toward relieving symptoms such as shortness of breath, decreased exercise tolerance, and extremity edema.    Expected Outcomes Improve functional capacity of life;Short term: Attendance in program 2-3 days a week with increased exercise capacity. Reported lower sodium intake. Reported increased fruit and vegetable intake. Reports medication compliance.;Short term: Daily weights obtained and reported for increase. Utilizing diuretic protocols set by physician.;Long term: Adoption of self-care skills and reduction of barriers for early signs and symptoms recognition and intervention leading to self-care maintenance.    Hypertension Yes    Intervention Provide education on lifestyle modifcations including regular physical activity/exercise, weight management, moderate sodium restriction and increased consumption of fresh fruit, vegetables, and low fat dairy, alcohol moderation, and smoking cessation.;Monitor prescription use compliance.    Expected Outcomes Short Term: Continued assessment and intervention until BP is < 140/53mm HG in hypertensive participants. < 130/49mm HG in hypertensive participants with diabetes, heart failure or chronic kidney disease.;Long Term: Maintenance of blood pressure at goal levels.    Lipids Yes    Intervention Provide education and support for participant on nutrition & aerobic/resistive exercise along with prescribed medications to achieve LDL '70mg'$ , HDL >$Remo'40mg'UzZDu$ .    Expected Outcomes Short Term: Participant states understanding of desired cholesterol values and is compliant with medications prescribed. Participant is following exercise prescription and nutrition guidelines.;Long Term: Cholesterol controlled with medications as prescribed, with individualized exercise RX and with personalized nutrition plan. Value goals: LDL < $Rem'70mg'ieIo$ , HDL > 40  mg.             Education:Diabetes - Individual verbal and written instruction to review signs/symptoms of diabetes, desired ranges of glucose level fasting, after meals and with exercise. Acknowledge that pre and post exercise glucose checks will be done for 3 sessions at entry of program.   Core Components/Risk Factors/Patient Goals Review:   Goals and Risk Factor Review     Row Name 04/09/22 1445 05/07/22 1413           Core Components/Risk Factors/Patient Goals Review   Personal Goals Review Weight Management/Obesity;Hypertension Weight Management/Obesity;Hypertension      Review Heraclio would like to maintain a weight of 165-170 lbs. He has been weighing himself everyday and states that he has been staying between 160-170 lbs. He also has been checking his BP every day and states that it has been staying within normal ranges. He was encouraged to continue to check both his weight and BP every day. Adal is content with his weight being at 165-170 lbs. He would like to maintain this weight through healthy eating habits and exercise. He has been checking his  BP at home everyday, and states that his BP is staying within normal ranges. 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      Expected Outcomes Short: Continue to check BP at home. Long: Continue to work towards weight goal. Short: Continue to check BP at home. Long: Continue to monitor lifestyle risk factors.               Core Components/Risk Factors/Patient Goals at Discharge (Final Review):   Goals and Risk Factor Review - 05/07/22 1413       Core Components/Risk Factors/Patient Goals Review   Personal Goals Review Weight Management/Obesity;Hypertension    Review Avantae is content with his weight being at 165-170 lbs. He would like to maintain this weight through healthy eating habits and exercise. He has been checking his BP at home  everyday, and states that his BP is staying within normal ranges. 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    Expected Outcomes Short: Continue to check BP at home. Long: Continue to monitor lifestyle risk factors.             ITP Comments:  ITP Comments     Row Name 02/12/22 1600 02/22/22 1554 02/26/22 1404 03/20/22 1134 03/21/22 1035   ITP Comments Virtual orientation call completed today. he has an appointment on Date: 02/22/2022  for EP eval and gym Orientation.  Documentation of diagnosis can be found in [facility] records Date: 02/22/2022 . Completed 6MWT and gym orientation. Initial ITP created and sent for review to Dr. Emily Filbert, Medical Director. First full day of exercise!  Patient was oriented to gym and equipment including functions, settings, policies, and procedures.  Patient's individual exercise prescription and treatment plan were reviewed.  All starting workloads were established based on the results of the 6 minute walk test done at initial orientation visit.  The plan for exercise progression was also introduced and progression will be customized based on patient's performance and goals. Almond has not been to rehab since 02/26/22 which was his 1st day of exercise. 30 Day review completed. Medical Director ITP review done, changes made as directed, and signed approval by Medical Director.   New out on medical leave    [place] Name 04/04/22 1452 04/18/22 1000 05/16/22 1351 06/07/22 1038 06/11/22 1411   ITP Comments Pt informed of 4.1 lb weight gain since class 2 days ago.. Pt states that he is traying to gain weight; is on diuretic. He is monitoring. 30 Day review completed. Medical Director ITP review done, changes made as directed, and signed approval by Medical Director. 30 Day review completed. Medical Director ITP review done, changes made as directed, and signed approval by Medical  Director. 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 Right now he is tentative to come back on 10/11, but will need clearance to return to rehab. Patient aware. Scout is returning to rehab today with clearance from his MD. Due to his absence and need to get back into the routine of rehab, goals were unable to be addressed at this time.    [place] Name 06/13/22 0803           ITP Comments 30 Day review completed. Medical Director ITP review done, changes made as directed, and signed approval by Medical Director.  OUt for Medical reason                Comments:

## 2022-06-13 NOTE — Progress Notes (Signed)
Daily Session Note  Patient Details  Name: Danny Marquez MRN: [id number] Date of Birth: 1946/08/29 Referring Provider:   Flowsheet Row Cardiac Rehab from 02/22/2022 in [facility] Cardiac and Pulmonary Rehab  Referring Provider Garth Schlatter MD       Encounter Date: 06/13/2022  Check In:  Session Check In - 06/13/22 1508       Check-In   Supervising physician immediately available to respond to emergencies See telemetry face sheet for immediately available ER MD    Location [facility]-Cardiac & Pulmonary Rehab    Staff Present Renita Papa, RN BSN;Jessica Luan Pulling, MA, RCEP, CCRP, Mindi Curling, RN, [place]    Virtual Visit No    Medication changes reported     No    Fall or balance concerns reported    No    Warm-up and Cool-down Performed on first and last piece of equipment    Resistance Training Performed Yes    VAD Patient? No    PAD/SET Patient? No      Pain Assessment   Currently in Pain? No/denies                Social History   Tobacco Use  Smoking Status Former   Years: 15.00   Types: Cigarettes  Smokeless Tobacco Never    Goals Met:  Independence with exercise equipment Exercise tolerated well No report of concerns or symptoms today Strength training completed today  Goals Unmet:  Not Applicable  Comments: Pt able to follow exercise prescription today without complaint.  Will continue to monitor for progression.    Dr. Emily Filbert is Medical Director for [facility].  Dr. Ottie Glazier is Medical Director for [facility] Pulmonary Rehabilitation.

## 2022-06-18 ENCOUNTER — Encounter: Payer: No Typology Code available for payment source | Admitting: *Deleted

## 2022-06-18 DIAGNOSIS — I5022 Chronic systolic (congestive) heart failure: Secondary | ICD-10-CM | POA: Diagnosis not present

## 2022-06-18 NOTE — Progress Notes (Signed)
Daily Session Note  Patient Details  Name: Danny Marquez MRN: [id number] Date of Birth: 1946-08-14 Referring Provider:   Flowsheet Row Cardiac Rehab from 02/22/2022 in [facility] Cardiac and Pulmonary Rehab  Referring Provider Garth Schlatter MD       Encounter Date: 06/18/2022  Check In:  Session Check In - 06/18/22 1359       Check-In   Supervising physician immediately available to respond to emergencies See telemetry face sheet for immediately available ER MD    Location [facility]-Cardiac & Pulmonary Rehab    Staff Present Justin Mend, RCP,RRT,BSRT;Nuri Larmer Sherryll Burger, RN Odelia Gage, RN, ADN    Virtual Visit No    Medication changes reported     No    Fall or balance concerns reported    No    Warm-up and Cool-down Performed on first and last piece of equipment    Resistance Training Performed Yes    VAD Patient? No    PAD/SET Patient? No      Pain Assessment   Currently in Pain? No/denies                Social History   Tobacco Use  Smoking Status Former   Years: 15.00   Types: Cigarettes  Smokeless Tobacco Never    Goals Met:  Independence with exercise equipment Exercise tolerated well No report of concerns or symptoms today Strength training completed today  Goals Unmet:  Not Applicable  Comments: Pt able to follow exercise prescription today without complaint.  Will continue to monitor for progression.    Dr. Emily Filbert is Medical Director for [facility] [facility].  Dr. Ottie Glazier is Medical Director for [facility] Pulmonary Rehabilitation.

## 2022-06-20 ENCOUNTER — Encounter: Payer: No Typology Code available for payment source | Admitting: *Deleted

## 2022-06-20 DIAGNOSIS — I5022 Chronic systolic (congestive) heart failure: Secondary | ICD-10-CM | POA: Diagnosis not present

## 2022-06-20 NOTE — Progress Notes (Signed)
Daily Session Note  Patient Details  Name: Danny Marquez MRN: [id number] Date of Birth: 02/17/1946 Referring Provider:   Flowsheet Row Cardiac Rehab from 02/22/2022 in [facility] Cardiac and Pulmonary Rehab  Referring Provider [place], Paul MD       Encounter Date: 06/20/2022  Check In:  Session Check In - 06/20/22 1418       Check-In   Supervising physician immediately available to respond to emergencies See telemetry face sheet for immediately available ER MD    Location [facility]-Cardiac & Pulmonary Rehab    Staff Present Jessica Hawkins, MA, RCEP, CCRP, CCET; , RN BSN;Kelly Hayes, BS, ACSM CEP, Exercise Physiologist    Virtual Visit No    Medication changes reported     No    Fall or balance concerns reported    No    Warm-up and Cool-down Performed on first and last piece of equipment    Resistance Training Performed Yes    VAD Patient? No    PAD/SET Patient? No      Pain Assessment   Currently in Pain? No/denies                Social History   Tobacco Use  Smoking Status Former   Years: 15.00   Types: Cigarettes  Smokeless Tobacco Never    Goals Met:  Independence with exercise equipment Exercise tolerated well No report of concerns or symptoms today Strength training completed today  Goals Unmet:  Not Applicable  Comments: Pt able to follow exercise prescription today without complaint.  Will continue to monitor for progression.    Dr. Mark Miller is Medical Director for [facility]iac [facility].  Dr. Fuad Aleskerov is Medical Director for [facility] Pulmonary Rehabilitation. 

## 2022-06-25 ENCOUNTER — Encounter: Payer: No Typology Code available for payment source | Admitting: *Deleted

## 2022-06-25 DIAGNOSIS — I5022 Chronic systolic (congestive) heart failure: Secondary | ICD-10-CM

## 2022-06-25 NOTE — Progress Notes (Signed)
Daily Session Note  Patient Details  Name: Danny Marquez MRN: [id number] Date of Birth: 1945-11-13 Referring Provider:   Flowsheet Row Cardiac Rehab from 02/22/2022 in [facility] Cardiac and Pulmonary Rehab  Referring Provider Garth Schlatter MD       Encounter Date: 06/25/2022  Check In:  Session Check In - 06/25/22 1420       Check-In   Supervising physician immediately available to respond to emergencies See telemetry face sheet for immediately available ER MD    Location [facility]-Cardiac & Pulmonary Rehab    Staff Present Justin Mend, RCP,RRT,BSRT;Atziry Baranski Sherryll Burger, RN Odelia Gage, RN, ADN    Virtual Visit No    Medication changes reported     No    Fall or balance concerns reported    No    Warm-up and Cool-down Performed on first and last piece of equipment    Resistance Training Performed Yes    VAD Patient? No    PAD/SET Patient? No      Pain Assessment   Currently in Pain? No/denies                Social History   Tobacco Use  Smoking Status Former   Years: 15.00   Types: Cigarettes  Smokeless Tobacco Never    Goals Met:  Independence with exercise equipment Exercise tolerated well No report of concerns or symptoms today Strength training completed today  Goals Unmet:  Not Applicable  Comments: Pt able to follow exercise prescription today without complaint.  Will continue to monitor for progression.    Dr. Emily Filbert is Medical Director for [facility].  Dr. Ottie Glazier is Medical Director for [facility] Pulmonary Rehabilitation.

## 2022-06-27 ENCOUNTER — Encounter: Payer: No Typology Code available for payment source | Admitting: *Deleted

## 2022-06-27 DIAGNOSIS — I5022 Chronic systolic (congestive) heart failure: Secondary | ICD-10-CM

## 2022-06-27 NOTE — Progress Notes (Signed)
Daily Session Note  Patient Details  Name: Danny Marquez MRN: [id number] Date of Birth: Apr 13, 1946 Referring Provider:   Flowsheet Row Cardiac Rehab from 02/22/2022 in [facility] Cardiac and Pulmonary Rehab  Referring Provider Garth Schlatter MD       Encounter Date: 06/27/2022  Check In:  Session Check In - 06/27/22 1414       Check-In   Supervising physician immediately available to respond to emergencies See telemetry face sheet for immediately available ER MD    Location [facility]-Cardiac & Pulmonary Rehab    Staff Present Earlean Shawl, BS, ACSM CEP, Exercise Physiologist;Kara Eliezer Bottom, MS, ASCM CEP, Exercise Physiologist;Meredith Sherryll Burger, RN Odelia Gage, RN, ADN    Virtual Visit No    Medication changes reported     No    Fall or balance concerns reported    No    Warm-up and Cool-down Performed on first and last piece of equipment    Resistance Training Performed Yes    VAD Patient? No    PAD/SET Patient? No      Pain Assessment   Currently in Pain? No/denies                Social History   Tobacco Use  Smoking Status Former   Years: 15.00   Types: Cigarettes  Smokeless Tobacco Never    Goals Met:  Independence with exercise equipment Exercise tolerated well No report of concerns or symptoms today Strength training completed today  Goals Unmet:  Not Applicable  Comments: Pt able to follow exercise prescription today without complaint.  Will continue to monitor for progression.    Dr. Emily Filbert is Medical Director for [place].  Dr. Ottie Glazier is Medical Director for [facility] Pulmonary Rehabilitation.

## 2022-07-02 ENCOUNTER — Encounter: Payer: No Typology Code available for payment source | Admitting: *Deleted

## 2022-07-02 DIAGNOSIS — I5022 Chronic systolic (congestive) heart failure: Secondary | ICD-10-CM | POA: Diagnosis not present

## 2022-07-02 NOTE — Progress Notes (Signed)
Daily Session Note  Patient Details  Name: Danny Marquez MRN: [id number] Date of Birth: May 07, 1946 Referring Provider:   Flowsheet Row Cardiac Rehab from 02/22/2022 in [facility] Cardiac and Pulmonary Rehab  Referring Provider Garth Schlatter MD       Encounter Date: 07/02/2022  Check In:  Session Check In - 07/02/22 1410       Check-In   Supervising physician immediately available to respond to emergencies See telemetry face sheet for immediately available ER MD    Location [facility]-Cardiac & Pulmonary Rehab    Staff Present Darlyne Russian, RN, ADN;Lilienne Weins Luan Pulling, MA, RCEP, CCRP, CCET;Joseph [place], [place]    Virtual Visit No    Medication changes reported     No    Fall or balance concerns reported    No    Warm-up and Cool-down Performed on first and last piece of equipment    Resistance Training Performed Yes    VAD Patient? No    PAD/SET Patient? No      Pain Assessment   Currently in Pain? No/denies                Social History   Tobacco Use  Smoking Status Former   Years: 15.00   Types: Cigarettes  Smokeless Tobacco Never    Goals Met:  Independence with exercise equipment Exercise tolerated well No report of concerns or symptoms today Strength training completed today  Goals Unmet:  Not Applicable  Comments: Pt able to follow exercise prescription today without complaint.  Will continue to monitor for progression.    Dr. Emily Filbert is Medical Director for [place].  Dr. Ottie Glazier is Medical Director for [facility] Pulmonary Rehabilitation.

## 2022-07-05 ENCOUNTER — Encounter: Payer: No Typology Code available for payment source | Admitting: *Deleted

## 2022-07-05 DIAGNOSIS — I5022 Chronic systolic (congestive) heart failure: Secondary | ICD-10-CM | POA: Diagnosis not present

## 2022-07-05 NOTE — Progress Notes (Signed)
Daily Session Note  Patient Details  Name: Danny Marquez MRN: [id number] Date of Birth: 09/23/1945 Referring Provider:   Flowsheet Row Cardiac Rehab from 02/22/2022 in [facility] Cardiac and Pulmonary Rehab  Referring Provider [place], Paul MD       Encounter Date: 07/05/2022  Check In:  Session Check In - 07/05/22 1444       Check-In   Supervising physician immediately available to respond to emergencies See telemetry face sheet for immediately available ER MD    Location [facility]-Cardiac & Pulmonary Rehab    Staff Present Joseph [place], RCP,RRT,BSRT; , RN, ADN;Jessica Hawkins, MA, RCEP, CCRP, CCET    Virtual Visit No    Medication changes reported     No    Fall or balance concerns reported    No    Warm-up and Cool-down Performed on first and last piece of equipment    Resistance Training Performed Yes    VAD Patient? No    PAD/SET Patient? No      Pain Assessment   Currently in Pain? No/denies                Social History   Tobacco Use  Smoking Status Former   Years: 15.00   Types: Cigarettes  Smokeless Tobacco Never    Goals Met:  Independence with exercise equipment Exercise tolerated well No report of concerns or symptoms today Strength training completed today  Goals Unmet:  Not Applicable  Comments: Pt able to follow exercise prescription today without complaint.  Will continue to monitor for progression.    Dr. Mark Miller is Medical Director for [facility].  Dr. Fuad Aleskerov is Medical Director for [facility] Pulmonary [facility]. 

## 2022-07-09 ENCOUNTER — Encounter: Payer: No Typology Code available for payment source | Admitting: *Deleted

## 2022-07-09 DIAGNOSIS — I5022 Chronic systolic (congestive) heart failure: Secondary | ICD-10-CM

## 2022-07-09 NOTE — Progress Notes (Signed)
Daily Session Note  Patient Details  Name: Danny Marquez MRN: [id number] Date of Birth: Nov 14, 1945 Referring Provider:   Flowsheet Row Cardiac Rehab from 02/22/2022 in [facility] Cardiac and Pulmonary Rehab  Referring Provider Garth Schlatter MD       Encounter Date: 07/09/2022  Check In:  Session Check In - 07/09/22 1422       Check-In   Supervising physician immediately available to respond to emergencies See telemetry face sheet for immediately available ER MD    Location [facility]-Cardiac & Pulmonary Rehab    Staff Present Renita Papa, RN BSN;Megan Tamala Julian, RN, Terie Purser, RCP,RRT,BSRT    Virtual Visit No    Medication changes reported     No    Fall or balance concerns reported    No    Warm-up and Cool-down Performed on first and last piece of equipment    Resistance Training Performed Yes    VAD Patient? No    PAD/SET Patient? No      Pain Assessment   Currently in Pain? No/denies                Social History   Tobacco Use  Smoking Status Former   Years: 15.00   Types: Cigarettes  Smokeless Tobacco Never    Goals Met:  Independence with exercise equipment Exercise tolerated well No report of concerns or symptoms today Strength training completed today  Goals Unmet:  Not Applicable  Comments: Pt able to follow exercise prescription today without complaint.  Will continue to monitor for progression.    Dr. Emily Filbert is Medical Director for [place].  Dr. Ottie Glazier is Medical Director for [facility] Pulmonary Rehabilitation.

## 2022-07-11 ENCOUNTER — Encounter: Payer: No Typology Code available for payment source | Attending: Internal Medicine | Admitting: *Deleted

## 2022-07-11 ENCOUNTER — Encounter: Payer: Self-pay | Admitting: *Deleted

## 2022-07-11 DIAGNOSIS — I5022 Chronic systolic (congestive) heart failure: Secondary | ICD-10-CM | POA: Insufficient documentation

## 2022-07-11 NOTE — Progress Notes (Signed)
Daily Session Note  Patient Details  Name: Danny Marquez MRN: [id number] Date of Birth: March 22, 1946 Referring Provider:   Flowsheet Row Cardiac Rehab from 02/22/2022 in [facility] Cardiac and Pulmonary Rehab  Referring Provider Garth Schlatter MD       Encounter Date: 07/11/2022  Check In:  Session Check In - 07/11/22 1405       Check-In   Supervising physician immediately available to respond to emergencies See telemetry face sheet for immediately available ER MD    Location [facility]-Cardiac & Pulmonary Rehab    Staff Present Renita Papa, RN Odelia Gage, RN, ADN;Jessica Luan Pulling, MA, RCEP, CCRP, CCET    Virtual Visit No    Medication changes reported     No    Fall or balance concerns reported    No    Warm-up and Cool-down Performed on first and last piece of equipment    Resistance Training Performed Yes    VAD Patient? No    PAD/SET Patient? No      Pain Assessment   Currently in Pain? No/denies                Social History   Tobacco Use  Smoking Status Former   Years: 15.00   Types: Cigarettes  Smokeless Tobacco Never    Goals Met:  Independence with exercise equipment Exercise tolerated well No report of concerns or symptoms today Strength training completed today  Goals Unmet:  Not Applicable  Comments: Pt able to follow exercise prescription today without complaint.  Will continue to monitor for progression.    Dr. Emily Filbert is Medical Director for [place].  Dr. Ottie Glazier is Medical Director for [facility] Pulmonary Rehabilitation.

## 2022-07-11 NOTE — Progress Notes (Signed)
Cardiac Individual Treatment Plan  Patient Details  Name: Danny Marquez MRN: [id number] Date of Birth: 1946-01-18 Referring Provider:   Flowsheet Row Cardiac Rehab from 02/22/2022 in [facility] Cardiac and Pulmonary Rehab  Referring Provider Garth Schlatter MD       Initial Encounter Date:  Flowsheet Row Cardiac Rehab from 02/22/2022 in [facility] Cardiac and Pulmonary Rehab  Date 02/22/22       Visit Diagnosis: Heart failure, chronic systolic ([place])  Patient's Home Medications on Admission:  Current Outpatient Medications:    allopurinol (ZYLOPRIM) 100 MG tablet, Take 200 mg by mouth daily., Disp: , Rfl:    amiodarone (PACERONE) 200 MG tablet, Take 100 mg by mouth 2 (two) times daily., Disp: , Rfl:    apixaban (ELIQUIS) 5 MG TABS tablet, Take 5 mg by mouth 2 (two) times daily., Disp: , Rfl:    apixaban (ELIQUIS) 5 MG TABS tablet, Take 5 mg by mouth 2 (two) times daily., Disp: , Rfl:    cefdinir (OMNICEF) 300 MG capsule, Take 1 capsule (300 mg total) by mouth 2 (two) times daily., Disp: 20 capsule, Rfl: 0   DONEPEZIL HCL PO, Take 10 mg by mouth., Disp: , Rfl:    empagliflozin (JARDIANCE) 25 MG TABS tablet, Take 12.5 mg by mouth daily., Disp: , Rfl:    HYDROcodone-acetaminophen (NORCO/VICODIN) 5-325 MG tablet, Take 1 tablet by mouth every 6 (six) hours as needed., Disp: 10 tablet, Rfl: 0   pregabalin (LYRICA) 50 MG capsule, Take 50 mg in the morning, 50 mg in the afternoon, and 100 mg at night, Disp: , Rfl:    ranolazine (RANEXA) 500 MG 12 hr tablet, Take 500 mg by mouth 2 (two) times daily., Disp: , Rfl:    rosuvastatin (CRESTOR) 5 MG tablet, , Disp: , Rfl:    sertraline (ZOLOFT) 100 MG tablet, Take 100 mg by mouth daily. , Disp: , Rfl:    spironolactone (ALDACTONE) 25 MG tablet, Take 25 mg by mouth daily., Disp: , Rfl:    torsemide (DEMADEX) 20 MG tablet, Take 20 mg by mouth daily., Disp: , Rfl:    TRAZODONE HCL PO, Take 75 mg by mouth., Disp: , Rfl:   Past Medical History: Past  Medical History:  Diagnosis Date   A-fib ([place])    CHF (congestive heart failure) (HCC)    EF 30%   Chronic kidney disease    congenital one kidney   Hypertension    Myocardial infarction ([place])    Solitary kidney     Tobacco Use: Social History   Tobacco Use  Smoking Status Former   Years: 15.00   Types: Cigarettes  Smokeless Tobacco Never    Labs: Review Flowsheet        No data to display           Exercise Target Goals: Exercise Program Goal: Individual exercise prescription set using results from initial 6 min walk test and THRR while considering  patient's activity barriers and safety.   Exercise Prescription Goal: Initial exercise prescription builds to 30-45 minutes a day of aerobic activity, 2-3 days per week.  Home exercise guidelines will be given to patient during program as part of exercise prescription that the participant will acknowledge.   Education: Aerobic Exercise: - Group verbal and visual presentation on the components of exercise prescription. Introduces F.I.T.T principle from ACSM for exercise prescriptions.  Reviews F.I.T.T. principles of aerobic exercise including progression. Written material given at graduation. Flowsheet Row Cardiac Rehab from 04/18/2022 in [facility] Cardiac  and Pulmonary Rehab  Education need identified 02/22/22       Education: Resistance Exercise: - Group verbal and visual presentation on the components of exercise prescription. Introduces F.I.T.T principle from ACSM for exercise prescriptions  Reviews F.I.T.T. principles of resistance exercise including progression. Written material given at graduation.    Education: Exercise & Equipment Safety: - Individual verbal instruction and demonstration of equipment use and safety with use of the equipment. Flowsheet Row Cardiac Rehab from 04/18/2022 in [facility] Cardiac and Pulmonary Rehab  Education need identified 02/22/22  Date 02/22/22  Educator [place]  Instruction Review Code 1-  Verbalizes Understanding       Education: Exercise Physiology & General Exercise Guidelines: - Group verbal and written instruction with models to review the exercise physiology of the cardiovascular system and associated critical values. Provides general exercise guidelines with specific guidelines to those with heart or lung disease.    Education: Flexibility, Balance, Mind/Body Relaxation: - Group verbal and visual presentation with interactive activity on the components of exercise prescription. Introduces F.I.T.T principle from ACSM for exercise prescriptions. Reviews F.I.T.T. principles of flexibility and balance exercise training including progression. Also discusses the mind body connection.  Reviews various relaxation techniques to help reduce and manage stress (i.e. Deep breathing, progressive muscle relaxation, and visualization). Balance handout provided to take home. Written material given at graduation.   Activity Barriers & Risk Stratification:  Activity Barriers & Cardiac Risk Stratification - 02/22/22 1555       Activity Barriers & Cardiac Risk Stratification   Activity Barriers Balance Concerns;Other (comment);Back Problems;Deconditioning;Right Knee Replacement;Left Knee Replacement;Muscular Weakness;History of Falls;Joint Problems    Comments neuropathy, multiple back surgeries, dizziness    Cardiac Risk Stratification High             6 Minute Walk:  6 Minute Walk     Row Name 02/22/22 1609         6 Minute Walk   Phase Initial     Distance 1020 feet     Walk Time 6 minutes     # of Rest Breaks 0     MPH 1.93     METS 2.07     RPE 10     Perceived Dyspnea  0     VO2 Peak 7.27     Symptoms Yes (comment)     Comments Right knee pain 3/10     Resting HR 62 bpm     Resting BP 98/56     Resting Oxygen Saturation  98 %     Exercise Oxygen Saturation  during 6 min walk 96 %     Max Ex. HR 105 bpm     Max Ex. BP 122/58     2 Minute Post BP 108/60               Oxygen Initial Assessment:   Oxygen Re-Evaluation:   Oxygen Discharge (Final Oxygen Re-Evaluation):   Initial Exercise Prescription:  Initial Exercise Prescription - 02/22/22 1600       Date of Initial Exercise RX and Referring Provider   Date 02/22/22    Referring Provider Garth Schlatter MD      Oxygen   Maintain Oxygen Saturation 88% or higher      Recumbant Bike   Level 1    RPM 60    Watts 10    Minutes 15    METs 2      NuStep   Level 1    SPM 80  Minutes 15    METs 2      Biostep-RELP   Level 1    SPM 50    Minutes 15    METs 2      Track   Laps 14    Minutes 15    METs 1.7      Prescription Details   Frequency (times per week) 3    Duration Progress to 30 minutes of continuous aerobic without signs/symptoms of physical distress      Intensity   THRR 40-80% of Max Heartrate 94- 127    Ratings of Perceived Exertion 11-13    Perceived Dyspnea 0-4      Progression   Progression Continue to progress workloads to maintain intensity without signs/symptoms of physical distress.      Resistance Training   Training Prescription Yes    Weight 4 lb    Reps 10-15             Perform Capillary Blood Glucose checks as needed.  Exercise Prescription Changes:   Exercise Prescription Changes     Row Name 02/22/22 1600 04/11/22 1200 04/24/22 1500 05/07/22 1500 07/03/22 1500     Response to Exercise   Blood Pressure (Admit) 98/56 122/62 100/60 102/56 116/62   Blood Pressure (Exercise) 122/58 128/66 110/62 122/60 125/72   Blood Pressure (Exit) 108/60 118/60 102/62 108/58 114/64   Heart Rate (Admit) 62 bpm 78 bpm 84 bpm 97 bpm 94 bpm   Heart Rate (Exercise) 105 bpm 116 bpm 100 bpm 94 bpm 111 bpm   Heart Rate (Exit) 64 bpm 69 bpm 88 bpm 93 bpm 91 bpm   Oxygen Saturation (Admit) 98 % -- -- -- --   Oxygen Saturation (Exercise) 96 % -- -- -- --   Oxygen Saturation (Exit) 96 % -- -- -- --   Rating of Perceived Exertion (Exercise)  _0 Perceived Dyspnea (Exercise) 0 -- -- -- --   Symptoms Right knee pain 3/10 none none none none   Comments walk test results 1st full day of exercise run of vtach- asymptomatic none return back post ventricular abalation   Duration -- Progress to 30 minutes of  aerobic without signs/symptoms of physical distress Continue with 30 min of aerobic exercise without signs/symptoms of physical distress. Continue with 30 min of aerobic exercise without signs/symptoms of physical distress. Continue with 30 min of aerobic exercise without signs/symptoms of physical distress.   Intensity -- THRR unchanged THRR unchanged THRR unchanged THRR unchanged     Progression   Progression -- Continue to progress workloads to maintain intensity without signs/symptoms of physical distress. Continue to progress workloads to maintain intensity without signs/symptoms of physical distress. Continue to progress workloads to maintain intensity without signs/symptoms of physical distress. Continue to progress workloads to maintain intensity without signs/symptoms of physical distress.   Average METs -- 1.82 1.92 2.81 2.43     Resistance Training   Training Prescription -- Yes Yes Yes Yes   Weight -- 4 lb 4 lb 4 lb 4 lb   Reps -- 10-15 10-15 10-15 10-15     Interval Training   Interval Training -- -- No No No     Recumbant Bike   Level -- -- _1 Watts -- -- -- 16 22   Minutes -- -- _2 METs -- -- 2.42 2.63 2.92     NuStep   Level -- 3 -- -- --  Minutes -- 15 -- -- --   METs -- 2.5 -- -- --     T5 Nustep   Level -- -- 1 -- --   Minutes -- -- 15 -- --     Biostep-RELP   Level -- _0 Minutes -- _1 METs -- _2 Track   Laps -- -- 14 -- 15   Minutes -- -- 15 -- 15   METs -- -- 1.76 -- 1.82     Home Exercise Plan   Plans to continue exercise at -- -- -- Home (comment)  walking Home (comment)  walking   Frequency -- -- -- Add 2 additional days to program  exercise sessions. Add 2 additional days to program exercise sessions.   Initial Home Exercises Provided -- -- -- 05/07/22 05/07/22     Oxygen   Maintain Oxygen Saturation -- 88% or higher 88% or higher 88% or higher 88% or higher            Exercise Comments:   Exercise Comments     Row Name 02/26/22 1404           Exercise Comments First full day of exercise!  Patient was oriented to gym and equipment including functions, settings, policies, and procedures.  Patient's individual exercise prescription and treatment plan were reviewed.  All starting workloads were established based on the results of the 6 minute walk test done at initial orientation visit.  The plan for exercise progression was also introduced and progression will be customized based on patient's performance and goals.                Exercise Goals and Review:   Exercise Goals     Row Name 02/22/22 1634             Exercise Goals   Increase Physical Activity Yes       Intervention Provide advice, education, support and counseling about physical activity/exercise needs.;Develop an individualized exercise prescription for aerobic and resistive training based on initial evaluation findings, risk stratification, comorbidities and participant's personal goals.       Expected Outcomes Short Term: Attend rehab on a regular basis to increase amount of physical activity.;Long Term: Add in home exercise to make exercise part of routine and to increase amount of physical activity.;Long Term: Exercising regularly at least 3-5 days a week.       Increase Strength and Stamina Yes       Intervention Provide advice, education, support and counseling about physical activity/exercise needs.;Develop an individualized exercise prescription for aerobic and resistive training based on initial evaluation findings, risk stratification, comorbidities and participant's personal goals.       Expected Outcomes Short Term: Increase  workloads from initial exercise prescription for resistance, speed, and METs.;Short Term: Perform resistance training exercises routinely during rehab and add in resistance training at home;Long Term: Improve cardiorespiratory fitness, muscular endurance and strength as measured by increased METs and functional capacity (6MWT)       Able to understand and use rate of perceived exertion (RPE) scale Yes       Intervention Provide education and explanation on how to use RPE scale       Expected Outcomes Short Term: Able to use RPE daily in rehab to express subjective intensity level;Long Term:  Able to use RPE to guide intensity level when exercising independently  Able to understand and use Dyspnea scale Yes       Intervention Provide education and explanation on how to use Dyspnea scale       Expected Outcomes Short Term: Able to use Dyspnea scale daily in rehab to express subjective sense of shortness of breath during exertion;Long Term: Able to use Dyspnea scale to guide intensity level when exercising independently       Knowledge and understanding of Target Heart Rate Range (THRR) Yes       Intervention Provide education and explanation of THRR including how the numbers were predicted and where they are located for reference       Expected Outcomes Short Term: Able to state/look up THRR;Long Term: Able to use THRR to govern intensity when exercising independently;Short Term: Able to use daily as guideline for intensity in rehab       Able to check pulse independently Yes       Intervention Provide education and demonstration on how to check pulse in carotid and radial arteries.;Review the importance of being able to check your own pulse for safety during independent exercise       Expected Outcomes Short Term: Able to explain why pulse checking is important during independent exercise;Long Term: Able to check pulse independently and accurately       Understanding of Exercise Prescription Yes        Intervention Provide education, explanation, and written materials on patient's individual exercise prescription       Expected Outcomes Short Term: Able to explain program exercise prescription;Long Term: Able to explain home exercise prescription to exercise independently                Exercise Goals Re-Evaluation :  Exercise Goals Re-Evaluation     Row Name 02/26/22 1404 04/09/22 1430 04/11/22 1204 04/24/22 1509 05/07/22 1430     Exercise Goal Re-Evaluation   Exercise Goals Review Increase Physical Activity;Able to understand and use rate of perceived exertion (RPE) scale;Understanding of Exercise Prescription;Knowledge and understanding of Target Heart Rate Range (THRR);Increase Strength and Stamina;Able to understand and use Dyspnea scale;Able to check pulse independently Increase Physical Activity;Understanding of Exercise Prescription;Increase Strength and Stamina Increase Physical Activity;Understanding of Exercise Prescription;Increase Strength and Stamina Increase Physical Activity;Understanding of Exercise Prescription;Increase Strength and Stamina Increase Physical Activity;Understanding of Exercise Prescription;Increase Strength and Stamina   Comments Reviewed RPE and dyspnea scales, THR and program prescription with pt today.  Pt voiced understanding and was given a copy of goals to take home. Bently is really enjoying the program. He wants to return to a more active lifestyle and increase his strength and stamina. He states that he is currently doing some bodyweight exercises at home given to hime by [employer]. He wants to return to being able to do the yard work he likes to do. He states that he is walking at least a mile everyday as well for endurance training. Tyrone is off to a good start in rehab. He had an average overall MET level of 1.82 METs. He also got up to level 3 on the T4. He tolerated using 4 lb hand weights for resistance training as well. We will continue to  monitor his progress in the program. Konstantine has been coming to rehab pretty consistently. He did have several runs of non-sustained v tach episodes dated on 8/2 where he was completely asymptomatic. He saw his EP doctor and they did an interrogation and lowered his rate. Other than that, he has been walking  the track averaging around 14 laps. Biostep level has increased to 3. We will slowly progress based on how his rhythm looks and how he feels. Per his wife, he is supposed to be getting a ventricular ablation in September, where we will need to place him on medical hold with return with clearance. We will continue to monitor. Reviewed home exercise with pt today.  Pt plans to walk around his neighborhood for exercise. He will also use his personal hand weights for resistance training at home. Emphasized exercising with supervision. Reviewed THR, pulse, RPE, sign and symptoms, pulse oximetery and when to call 911 or MD.  Also discussed weather considerations and indoor options.  Pt voiced understanding.   Expected Outcomes Short: Use RPE daily to regulate intensity. Long: Follow program prescription in THR. Short: Continue to walk at home. Long: Continue to improve strength and stamina. Short: Continue to attend rehab and follow exercise prescription. Long: Continue to improve overall MET levels. Short: Watch rhythm and report any symptoms, continue walking on the track Long: Continue to increase overall strength and stamina Short: Begin walking at home on days away from rehab. Long: Continue to increase overall strength and stamina.    [place] Name 06/07/22 0813 07/03/22 1514           Exercise Goal Re-Evaluation   Exercise Goals Review Increase Physical Activity;Understanding of Exercise Prescription;Increase Strength and Stamina Increase Physical Activity;Understanding of Exercise Prescription;Increase Strength and Stamina      Comments Patient has not been here since last review as he had a VT ablation  complete. Patient should be cleared and will return to rehab in a couple of weeks. Kadar has returned since his ablation and doing well in rehab. We eased him back into exercise and he is slowly starting to build it back up. He did try out level 3 on the recumbent bike and worked up to 22 watts which is the highest he has had yet! He was also able to walk a full 15 laps on the track. We will continue to monitor.      Expected Outcomes Short:Clear and return to rehab Long: Complete [facility] Program Short: Continue to work up laps on track Long: Continue to build up strength and stamina               Discharge Exercise Prescription (Final Exercise Prescription Changes):  Exercise Prescription Changes - 07/03/22 1500       Response to Exercise   Blood Pressure (Admit) 116/62    Blood Pressure (Exercise) 125/72    Blood Pressure (Exit) 114/64    Heart Rate (Admit) 94 bpm    Heart Rate (Exercise) 111 bpm    Heart Rate (Exit) 91 bpm    Rating of Perceived Exertion (Exercise) 13    Symptoms none    Comments return back post ventricular abalation    Duration Continue with 30 min of aerobic exercise without signs/symptoms of physical distress.    Intensity THRR unchanged      Progression   Progression Continue to progress workloads to maintain intensity without signs/symptoms of physical distress.    Average METs 2.43      Resistance Training   Training Prescription Yes    Weight 4 lb    Reps 10-15      Interval Training   Interval Training No      Recumbant Bike   Level 3    Watts 22    Minutes 15    METs 2.92  Biostep-RELP   Level 1    Minutes 15    METs 2      Track   Laps 15    Minutes 15    METs 1.82      Home Exercise Plan   Plans to continue exercise at Home (comment)   walking   Frequency Add 2 additional days to program exercise sessions.    Initial Home Exercises Provided 05/07/22      Oxygen   Maintain Oxygen Saturation 88% or higher              Nutrition:  Target Goals: Understanding of nutrition guidelines, daily intake of sodium <1532m, cholesterol <2024m calories 30% from fat and 7% or less from saturated fats, daily to have 5 or more servings of fruits and vegetables.  Education: All About Nutrition: -Group instruction provided by verbal, written material, interactive activities, discussions, models, and posters to present general guidelines for heart healthy nutrition including fat, fiber, MyPlate, the role of sodium in heart healthy nutrition, utilization of the nutrition label, and utilization of this knowledge for meal planning. Follow up email sent as well. Written material given at graduation. [facility]  Education need identified 02/22/22       Biometrics:  Pre Biometrics - 02/22/22 1554       Pre Biometrics   Height 5' 5.75" (1.67 m)    Weight 164 lb 11.2 oz (74.7 kg)    BMI (Calculated) 26.79    Single Leg Stand 0 seconds              Nutrition Therapy Plan and Nutrition Goals:  Nutrition Therapy & Goals - 04/23/22 1507       Nutrition Therapy   RD appointment deferred Yes   Pt would like to defer speaking about nutrition, but may want to in the future. Will continue to follow up.     Personal Nutrition Goals   Nutrition Goal Pt would like to defer speaking about nutrition, but may want to in the future. Will continue to follow up.             Nutrition Assessments:  MEDIFICTS Score Key: ?70 Need to make dietary changes  40-70 Heart Healthy Diet ? 40 Therapeutic Level Cholesterol Diet  [facility]  Picture Your Plate Total Score on Admission 62      Picture Your Plate Scores: <4<21nhealthy dietary pattern with much room for improvement. 41-50 Dietary pattern unlikely to meet recommendations for good health and room for improvement. 51-60 More  healthful dietary pattern, with some room for improvement.  >60 Healthy dietary pattern, although there may be some specific behaviors that could be improved.    Nutrition Goals Re-Evaluation:  Nutrition Goals Re-Evaluation     RoSleepy Hollowame 04/09/22 1443 07/09/22 1409           Goals   Comment Anakin does not want to meet with the RD at this time, but would be open to speaking with her in the future. Ho does not want to meet with the dietitian at this time.      Expected Outcome Short: Plan to meet with RD Long: Schedule meeting with RD --               Nutrition Goals Discharge (Final Nutrition Goals Re-Evaluation):  Nutrition Goals Re-Evaluation - 07/09/22 1409  Goals   Comment Ladarryl does not want to meet with the dietitian at this time.             Psychosocial: Target Goals: Acknowledge presence or absence of significant depression and/or stress, maximize coping skills, provide positive support system. Participant is able to verbalize types and ability to use techniques and skills needed for reducing stress and depression.   Education: Stress, Anxiety, and Depression - Group verbal and visual presentation to define topics covered.  Reviews how body is impacted by stress, anxiety, and depression.  Also discusses healthy ways to reduce stress and to treat/manage anxiety and depression.  Written material given at graduation.   Education: Sleep Hygiene -Provides group verbal and written instruction about how sleep can affect your health.  Define sleep hygiene, discuss sleep cycles and impact of sleep habits. Review good sleep hygiene tips.    Initial Review & Psychosocial Screening:  Initial Psych Review & Screening - 02/12/22 1533       Initial Review   Current issues with None Identified      Family Dynamics   Good Support System? Yes   wife,     Barriers   Psychosocial barriers to participate in program There are no identifiable barriers or  psychosocial needs.      Screening Interventions   Interventions Encouraged to exercise    Expected Outcomes Short Term goal: Utilizing psychosocial counselor, staff and physician to assist with identification of specific Stressors or current issues interfering with healing process. Setting desired goal for each stressor or current issue identified.;Long Term Goal: Stressors or current issues are controlled or eliminated.;Short Term goal: Identification and review with participant of any Quality of Life or Depression concerns found by scoring the questionnaire.;Long Term goal: The participant improves quality of Life and PHQ9 Scores as seen by post scores and/or verbalization of changes             Quality of Life Scores:   Quality of Life - 02/22/22 1556       Quality of Life   Select Quality of Life      Quality of Life Scores   Health/Function Pre 8.3 %    Socioeconomic Pre 25.19 %    Psych/Spiritual Pre 22.71 %    Family Pre 30 %    GLOBAL Pre 18.14 %            Scores of 19 and below usually indicate a poorer quality of life in these areas.  A difference of  2-3 points is a clinically meaningful difference.  A difference of 2-3 points in the total score of the Quality of Life Index has been associated with significant improvement in overall quality of life, self-image, physical symptoms, and general health in studies assessing change in quality of life.  PHQ-9: Review Flowsheet       04/09/2022 02/22/2022  Depression screen PHQ 2/9  Decreased Interest 0 0  Down, Depressed, Hopeless 0 1  PHQ - 2 Score 0 1  Altered sleeping 0 2  Tired, decreased energy 3 2  Change in appetite 0 0  Feeling bad or failure about yourself  0 0  Trouble concentrating 0 0  Moving slowly or fidgety/restless 0 1  Suicidal thoughts 0 0  PHQ-9 Score 3 6  Difficult doing work/chores Not difficult at all Not difficult at all   Interpretation of Total Score  Total Score Depression Severity:   1-4 = Minimal depression, 5-9 = Mild depression, 10-14 =  Moderate depression, 15-19 = Moderately severe depression, 20-27 = Severe depression   Psychosocial Evaluation and Intervention:  Psychosocial Evaluation - 02/12/22 1554       Psychosocial Evaluation & Interventions   Comments Kavan has no barriers to attending the program. He lives with his wife and she is his support. He wants to get healthy again and return to his ability to [employer] yard s and weed the garden. 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 The threshold has been decreased to about 150 beats as of last month when he had several episodes of VTach. 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 He has had several falls, from the neuropathy and from dizzy spells.  He understands that we will not have him on the treadmill to start. He is ready to get started    Expected Outcomes STG Asar attends all scheduled sessions, he is able to progress with his exercise to return to his lawn chores without too much effort. LTG Liron continues to progress with his day to day activity.    Continue Psychosocial Services  Follow up required by staff             Psychosocial Re-Evaluation:  Psychosocial Re-Evaluation     [place] Name 04/09/22 1437 05/07/22 1409 07/09/22 1420         Psychosocial Re-Evaluation   Current issues with Current Stress Concerns;Current Sleep Concerns None Identified None Identified;Current Stress Concerns     Comments Leelynd states that he has been frustrated because he feels he is not as healthy as used to be. He is motivated to work hard to get back to feeling healthy and fit. He also states that exercise is a good stress reliever for him. He states that his wife, son, and friends are a good support system for him and keep him motivated. He states that he is having some issues with his sleep due to some medication changes. Asencion denies any current stressors at this time. He states  that exercise has been a good stress reliever for him, but he does get frustrated when he is not performing as well as he would like during exercise. He states that he has a great support system around him made up mainly by his wife, but also his children and grandchildren. He also reports that after speaking with his doctor and changing some of his medications he is sleeping much better. Tracie states that he does not have any mental issues at the moment. He is in a lot of back pain daily which causes him to have some stress. He does not take anything for anxiety or depression.     Expected Outcomes Short: Continue to attend rehab for stress relief. Long: Maintain positive outlook. Short: Continue to attend rehab for stress relief. Long: Maintain positive outlook. Short: Attend [facility] stress management education to decrease stress. Long: Maintain exercise Post [facility] to keep stress at a minimum.     Interventions -- Encouraged to attend Cardiac Rehabilitation for the exercise Encouraged to attend Cardiac Rehabilitation for the exercise     Continue Psychosocial Services  Follow up required by staff Follow up required by staff Follow up required by staff       Initial Review   Source of Stress Concerns -- None Identified --              Psychosocial Discharge (Final Psychosocial Re-Evaluation):  Psychosocial Re-Evaluation - 07/09/22 1420  Psychosocial Re-Evaluation   Current issues with None Identified;Current Stress Concerns    Comments Hadyn states that he does not have any mental issues at the moment. He is in a lot of back pain daily which causes him to have some stress. He does not take anything for anxiety or depression.    Expected Outcomes Short: Attend [facility] stress management education to decrease stress. Long: Maintain exercise Post [facility] to keep stress at a minimum.    Interventions Encouraged to attend Cardiac Rehabilitation for the exercise    Continue  Psychosocial Services  Follow up required by staff             Vocational Rehabilitation: Provide vocational rehab assistance to qualifying candidates.   Vocational Rehab Evaluation & Intervention:   Education: Education Goals: Education classes will be provided on a variety of topics geared toward better understanding of heart health and risk factor modification. Participant will state understanding/return demonstration of topics presented as noted by education test scores.  Learning Barriers/Preferences:   General Cardiac Education Topics:  AED/CPR: - Group verbal and written instruction with the use of models to demonstrate the basic use of the AED with the basic ABC's of resuscitation.   Anatomy and Cardiac Procedures: - Group verbal and visual presentation and models provide information about basic cardiac anatomy and function. Reviews the testing methods done to diagnose heart disease and the outcomes of the test results. Describes the treatment choices: Medical Management, Angioplasty, or Coronary Bypass Surgery for treating various heart conditions including Myocardial Infarction, Angina, Valve Disease, and Cardiac Arrhythmias.  Written material given at graduation. Flowsheet Row Cardiac Rehab from [facility]/18/2022 in [facility] Cardiac and Pulmonary Rehab  Education need identified 02/22/22       Medication Safety: - Group verbal and visual instruction to review commonly prescribed medications for heart and lung disease. Reviews the medication, class of the drug, and side effects. Includes the steps to properly store meds and maintain the prescription regimen.  Written material given at graduation.   Intimacy: - Group verbal instruction through game format to discuss how heart and lung disease can affect sexual intimacy. Written material given at graduation..   Know Your Numbers and Heart Failure: - Group verbal and visual instruction to discuss disease risk factors for cardiac  and pulmonary disease and treatment options.  Reviews associated critical values for Overweight/Obesity, Hypertension, Cholesterol, and Diabetes.  Discusses basics of heart failure: signs/symptoms and treatments.  Introduces Heart Failure Zone chart for action plan for heart failure.  Written material given at graduation. Flowsheet Row Cardiac Rehab from 04/18/2022 in [facility] Card[facility]monary Rehab  Education need identified 02/22/22  Date 04/18/22  Educator SB  Instruction Review Code 1- Verbalizes Understanding       Infection Prevention: - Provides verbal and written material to individual with discussion of infection control including proper hand washing and proper equipment cleaning during exercise session. Flowsheet Row Cardiac Rehab from 04/18/2022 in [facility] Cardiac and Pulmonary Rehab  Education need identified 02/22/22  Date 02/22/22  Educator [place]  Instruction Review Code 1- Verbalizes Understanding       Falls Prevention: - Provides verbal and written material to individual with discussion of falls prevention and safety. Flowsheet Row Cardiac Rehab from 02/12/2022 in [facility] Cardiac and Pulmonary Rehab  Date 02/12/22  Educator SB  Instruction Review Code 1- Verbalizes Understanding       Other: -Provides group and verbal instruction on various topics (see comments)   Knowledge Questionnaire Score:  Knowledge Questionnaire Score - 02/22/22 1556       Knowledge Questionnaire Score   Pre Score 20/26             Core Components/Risk Factors/Patient Goals at Admission:  Personal Goals and Risk Factors at Admission - 02/22/22 1635       Core Components/Risk Factors/Patient Goals on Admission    Weight Management Yes;Weight Maintenance    Intervention Weight Management: Develop a combined nutrition and exercise program designed to reach desired caloric intake, while maintaining appropriate intake of nutrient and fiber, sodium and fats, and appropriate energy expenditure  required for the weight goal.;Weight Management: Provide education and appropriate resources to help participant work on and attain dietary goals.;Weight Management/Obesity: Establish reasonable short term and long term weight goals.    Admit Weight 164 lb (74.4 kg)    Goal Weight: Short Term 164 lb (74.4 kg)    Goal Weight: Long Term 164 lb (74.4 kg)    Expected Outcomes Short Term: Continue to assess and modify interventions until short term weight is achieved;Long Term: Adherence to nutrition and physical activity/exercise program aimed toward attainment of established weight goal;Weight Maintenance: Understanding of the daily nutrition guidelines, which includes 25-35% calories from fat, 7% or less cal from saturated fats, less than 219m cholesterol, less than 1.5gm of sodium, & 5 or more servings of fruits and vegetables daily;Understanding recommendations for meals to include 15-35% energy as protein, 25-35% energy from fat, 35-60% energy from carbohydrates, less than 2017mof dietary cholesterol, 20-35 gm of total fiber daily;Understanding of distribution of calorie intake throughout the day with the consumption of 4-5 meals/snacks    Heart Failure Yes    Intervention Provide a combined exercise and nutrition program that is supplemented with education, support and counseling about heart failure. Directed toward relieving symptoms such as shortness of breath, decreased exercise tolerance, and extremity edema.    Expected Outcomes Improve functional capacity of life;Short term: Attendance in program 2-3 days a week with increased exercise capacity. Reported lower sodium intake. Reported increased fruit and vegetable intake. Reports medication compliance.;Short term: Daily weights obtained and reported for increase. Utilizing diuretic protocols set by physician.;Long term: Adoption of self-care skills and reduction of barriers for early signs and symptoms recognition and intervention leading to  self-care maintenance.    Hypertension Yes    Intervention Provide education on lifestyle modifcations including regular physical activity/exercise, weight management, moderate sodium restriction and increased consumption of fresh fruit, vegetables, and low fat dairy, alcohol moderation, and smoking cessation.;Monitor prescription use compliance.    Expected Outcomes Short Term: Continued assessment and intervention until BP is < 140/903mG in hypertensive participants. < 130/37m51m in hypertensive participants with diabetes, heart failure or chronic kidney disease.;Long Term: Maintenance of blood pressure at goal levels.    Lipids Yes    Intervention Provide education and support for participant on nutrition & aerobic/resistive exercise along with prescribed medications to achieve LDL <70mg56mL >40mg.55mExpected Outcomes Short Term: Participant states understanding of desired cholesterol values and is compliant with medications prescribed. Participant is following exercise prescription and nutrition guidelines.;Long Term: Cholesterol controlled with medications as prescribed, with individualized exercise RX and with personalized nutrition plan. Value goals: LDL < 70mg, 24m> 40 mg.             Education:Diabetes - Individual verbal and written instruction to review signs/symptoms of diabetes, desired ranges of glucose level fasting, after meals and with exercise. Acknowledge that pre and  post exercise glucose checks will be done for 3 sessions at entry of program.   Core Components/Risk Factors/Patient Goals Review:   Goals and Risk Factor Review     Row Name 04/09/22 1445 05/07/22 1413 07/09/22 1414         Core Components/Risk Factors/Patient Goals Review   Personal Goals Review Weight Management/Obesity;Hypertension Weight Management/Obesity;Hypertension Weight Management/Obesity;Hypertension;Other     Review Alf would like to maintain a weight of 165-170 lbs. He has been  weighing himself everyday and states that he has been staying between 160-170 lbs. He also has been checking his BP every day and states that it has been staying within normal ranges. He was encouraged to continue to check both his weight and BP every day. Payden is content with his weight being at 165-170 lbs. He would like to maintain this weight through healthy eating habits and exercise. He has been checking his BP at home everyday, and states that his BP is staying within normal ranges. 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 Hillary is going to go to pain therapy for his back. He has had to operations on his back in the past. It is hard for him to get around they way he wants to. He wants to ask his doctor of some therapy options for his back pain even if it means surgery.     Expected Outcomes Short: Continue to check BP at home. Long: Continue to work towards weight goal. Short: Continue to check BP at home. Long: Continue to monitor lifestyle risk factors. Short: speek with doctor about back pain. Long: maintain and follow up with doctor to keep back pain at a minimum.              Core Components/Risk Factors/Patient Goals at Discharge (Final Review):   Goals and Risk Factor Review - 07/09/22 1414       Core Components/Risk Factors/Patient Goals Review   Personal Goals Review Weight Management/Obesity;Hypertension;Other    Review Brenner is going to go to pain therapy for his back. He has had to operations on his back in the past. It is hard for him to get around they way he wants to. He wants to ask his doctor of some therapy options for his back pain even if it means surgery.    Expected Outcomes Short: speek with doctor about back pain. Long: maintain and follow up with doctor to keep back pain at a minimum.             ITP Comments:  ITP Comments     Row Name 02/12/22 1600 02/22/22 1554  02/26/22 1404 03/20/22 1134 03/21/22 1035   ITP Comments Virtual orientation call completed today. he has an appointment on Date: 02/22/2022  for EP eval and gym Orientation.  Documentation of diagnosis can be found in [facility] records Date: 02/22/2022 . Completed 6MWT and gym orientation. Initial ITP created and sent for review to Dr. Emily Filbert, Medical Director. First full day of exercise!  Patient was oriented to gym and equipment including functions, settings, policies, and procedures.  Patient's individual exercise prescription and treatment plan were reviewed.  All starting workloads were established based on the results of the 6 minute walk test done at initial orientation visit.  The plan for exercise progression was also introduced and progression will be customized based on patient's performance and goals. Duwan has not been to rehab  since 02/26/22 which was his 1st day of exercise. 30 Day review completed. Medical Director ITP review done, changes made as directed, and signed approval by Medical Director.   New out on medical leave    [place] Name 04/04/22 1452 04/18/22 1000 05/16/22 1351 06/07/22 1038 06/11/22 1411   ITP Comments Pt informed of 4.1 lb weight gain since class 2 days ago.. Pt states that he is traying to gain weight; is on diuretic. He is monitoring. 30 Day review completed. Medical Director ITP review done, changes made as directed, and signed approval by Medical Director. 30 Day review completed. Medical Director ITP review done, changes made as directed, and signed approval by Medical Director. 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 Right now he is tentative to come back on 10/11, but will need clearance to return to rehab. Patient aware. Huxley is returning to rehab today with clearance from his MD. Due to his absence and need to get back into the routine of rehab, goals were unable to be addressed at this time.    [place] Name 06/13/22 0803  07/11/22 1034         ITP Comments 30 Day review completed. Medical Director ITP review done, changes made as directed, and signed approval by Medical Director.    OUt for Medical reason 30 Day review completed. Medical Director ITP review done, changes made as directed, and signed approval by Medical Director.               Comments:

## 2022-07-16 ENCOUNTER — Encounter: Payer: No Typology Code available for payment source | Admitting: *Deleted

## 2022-07-16 DIAGNOSIS — I5022 Chronic systolic (congestive) heart failure: Secondary | ICD-10-CM | POA: Diagnosis not present

## 2022-07-16 NOTE — Progress Notes (Signed)
Daily Session Note  Patient Details  Name: Danny Marquez MRN: [id number] Date of Birth: 31-Dec-1945 Referring Provider:   Flowsheet Row Cardiac Rehab from 02/22/2022 in [facility] Cardiac and Pulmonary Rehab  Referring Provider Garth Schlatter MD       Encounter Date: 07/16/2022  Check In:  Session Check In - 07/16/22 1405       Check-In   Supervising physician immediately available to respond to emergencies See telemetry face sheet for immediately available ER MD    Location [facility]-Cardiac & Pulmonary Rehab    Staff Present Justin Mend, RCP,RRT,BSRT;Grayson Pfefferle Sherryll Burger, RN Odelia Gage, RN, ADN    Virtual Visit No    Medication changes reported     No    Fall or balance concerns reported    No    Warm-up and Cool-down Performed on first and last piece of equipment    Resistance Training Performed Yes    VAD Patient? No    PAD/SET Patient? No      Pain Assessment   Currently in Pain? No/denies                Social History   Tobacco Use  Smoking Status Former   Years: 15.00   Types: Cigarettes  Smokeless Tobacco Never    Goals Met:  Independence with exercise equipment Exercise tolerated well No report of concerns or symptoms today Strength training completed today  Goals Unmet:  Not Applicable  Comments: Pt able to follow exercise prescription today without complaint.  Will continue to monitor for progression.    Dr. Emily Filbert is Medical Director for [place].  Dr. Ottie Glazier is Medical Director for [facility] Pulmonary Rehabilitation.

## 2022-07-18 ENCOUNTER — Encounter: Payer: No Typology Code available for payment source | Admitting: *Deleted

## 2022-07-18 DIAGNOSIS — I5022 Chronic systolic (congestive) heart failure: Secondary | ICD-10-CM | POA: Diagnosis not present

## 2022-07-18 NOTE — Progress Notes (Signed)
Daily Session Note  Patient Details  Name: Danny Marquez MRN: [id number] Date of Birth: 02/08/1946 Referring Provider:   Flowsheet Row Cardiac Rehab from 02/22/2022 in [facility] Cardiac and Pulmonary Rehab  Referring Provider Garth Schlatter MD       Encounter Date: 07/18/2022  Check In:  Session Check In - 07/18/22 1408       Check-In   Supervising physician immediately available to respond to emergencies See telemetry face sheet for immediately available ER MD    Location [facility]-Cardiac & Pulmonary Rehab    Staff Present Alberteen Sam, MA, RCEP, CCRP, Mindi Curling, RN, ADN;Trameka Dorough Sherryll Burger, RN BSN    Virtual Visit No    Medication changes reported     No    Fall or balance concerns reported    No    Warm-up and Cool-down Performed on first and last piece of equipment    Resistance Training Performed Yes    VAD Patient? No    PAD/SET Patient? No      Pain Assessment   Currently in Pain? No/denies                Social History   Tobacco Use  Smoking Status Former   Years: 15.00   Types: Cigarettes  Smokeless Tobacco Never    Goals Met:  Independence with exercise equipment Exercise tolerated well No report of concerns or symptoms today Strength training completed today  Goals Unmet:  Not Applicable  Comments: Pt able to follow exercise prescription today without complaint.  Will continue to monitor for progression.    Dr. Emily Filbert is Medical Director for [place].  Dr. Ottie Glazier is Medical Director for [facility] Pulmonary Rehabilitation.

## 2022-07-23 ENCOUNTER — Encounter: Payer: No Typology Code available for payment source | Admitting: *Deleted

## 2022-07-23 DIAGNOSIS — I5022 Chronic systolic (congestive) heart failure: Secondary | ICD-10-CM

## 2022-07-23 NOTE — Progress Notes (Signed)
Daily Session Note  Patient Details  Name: Danny Marquez MRN: [id number] Date of Birth: [birth year] Referring Provider:   Flowsheet Row Cardiac Rehab from 02/22/2022 in [facility] Cardiac and Pulmonary Rehab  Referring Provider Garth Schlatter MD       Encounter Date: 07/23/2022  Check In:  Session Check In - 07/23/22 1452       Check-In   Supervising physician immediately available to respond to emergencies See telemetry face sheet for immediately available ER MD    Location [facility]-Cardiac & Pulmonary Rehab    Staff Present Heath Lark, RN, BSN, CCRP;Joseph [place], Ernestina Patches, RN, [place]    Virtual Visit No    Medication changes reported     No    Fall or balance concerns reported    No    Warm-up and Cool-down Performed on first and last piece of equipment    Resistance Training Performed Yes    VAD Patient? No    PAD/SET Patient? No      Pain Assessment   Currently in Pain? No/denies                Social History   Tobacco Use  Smoking Status Former   Years: 15.00   Types: Cigarettes  Smokeless Tobacco Never    Goals Met:  Independence with exercise equipment Exercise tolerated well No report of concerns or symptoms today  Goals Unmet:  Not Applicable  Comments: Pt able to follow exercise prescription today without complaint.  Will continue to monitor for progression.    Dr. Emily Filbert is Medical Director for [place].  Dr. Ottie Glazier is Medical Director for [facility] Pulmonary Rehabilitation.

## 2022-07-25 ENCOUNTER — Encounter: Payer: No Typology Code available for payment source | Admitting: *Deleted

## 2022-07-25 DIAGNOSIS — I5022 Chronic systolic (congestive) heart failure: Secondary | ICD-10-CM

## 2022-07-25 NOTE — Progress Notes (Signed)
Daily Session Note  Patient Details  Name: Danny Marquez MRN: [id number] Date of Birth: Jan 17, 1946 Referring Provider:   Flowsheet Row Cardiac Rehab from 02/22/2022 in [facility] Cardiac and Pulmonary Rehab  Referring Provider Garth Schlatter MD       Encounter Date: 07/25/2022  Check In:  Session Check In - 07/25/22 1428       Check-In   Supervising physician immediately available to respond to emergencies See telemetry face sheet for immediately available ER MD    Location [facility]-Cardiac & Pulmonary Rehab    Staff Present Renita Papa, RN Odelia Gage, RN, Doyce Para, BS, ACSM CEP, Exercise Physiologist    Virtual Visit No    Medication changes reported     No    Fall or balance concerns reported    No    Warm-up and Cool-down Performed on first and last piece of equipment    Resistance Training Performed Yes    VAD Patient? No    PAD/SET Patient? No      Pain Assessment   Currently in Pain? No/denies                Social History   Tobacco Use  Smoking Status Former   Years: 15.00   Types: Cigarettes  Smokeless Tobacco Never    Goals Met:  Independence with exercise equipment Exercise tolerated well No report of concerns or symptoms today Strength training completed today  Goals Unmet:  Not Applicable  Comments: Pt able to follow exercise prescription today without complaint.  Will continue to monitor for progression.    Dr. Emily Filbert is Medical Director for [place].  Dr. Ottie Glazier is Medical Director for [facility] Pulmonary Rehabilitation.

## 2022-07-30 ENCOUNTER — Encounter: Payer: No Typology Code available for payment source | Admitting: *Deleted

## 2022-07-30 VITALS — Ht 65.75 in | Wt 170.9 lb

## 2022-07-30 DIAGNOSIS — I5022 Chronic systolic (congestive) heart failure: Secondary | ICD-10-CM

## 2022-07-30 NOTE — Patient Instructions (Signed)
Discharge Patient Instructions  Patient Details  Name: Danny Marquez MRN: [id number] Date of Birth: 1946-05-30 Referring Provider:  [place]   Number of Visits: 104  Reason for Discharge:  Patient reached a stable level of exercise. Patient independent in their exercise. Patient has met program and personal goals.  Smoking History:  Social History   Tobacco Use  Smoking Status Former   Years: 15.00   Types: Cigarettes  Smokeless Tobacco Never    Diagnosis:  Heart failure, chronic systolic (HCC)  Initial Exercise Prescription:  Initial Exercise Prescription - 02/22/22 1600       Date of Initial Exercise RX and Referring Provider   Date 02/22/22    Referring Provider Garth Schlatter MD      Oxygen   Maintain Oxygen Saturation 88% or higher      Recumbant Bike   Level 1    RPM 60    Watts 10    Minutes 15    METs 2      NuStep   Level 1    SPM 80    Minutes 15    METs 2      Biostep-RELP   Level 1    SPM 50    Minutes 15    METs 2      Track   Laps 14    Minutes 15    METs 1.7      Prescription Details   Frequency (times per week) 3    Duration Progress to 30 minutes of continuous aerobic without signs/symptoms of physical distress      Intensity   THRR 40-80% of Max Heartrate 94- 127    Ratings of Perceived Exertion 11-13    Perceived Dyspnea 0-4      Progression   Progression Continue to progress workloads to maintain intensity without signs/symptoms of physical distress.      Resistance Training   Training Prescription Yes    Weight 4 lb    Reps 10-15             Discharge Exercise Prescription (Final Exercise Prescription Changes):  Exercise Prescription Changes - 07/17/22 1500       Response to Exercise   Blood Pressure (Admit) 132/70    Blood Pressure (Exit) 96/56    Heart Rate (Admit) 82 bpm    Heart Rate (Exercise) 100 bpm    Heart Rate (Exit) 86 bpm    Rating of Perceived Exertion (Exercise) 15     Symptoms none    Duration Continue with 30 min of aerobic exercise without signs/symptoms of physical distress.    Intensity THRR unchanged      Progression   Progression Continue to progress workloads to maintain intensity without signs/symptoms of physical distress.    Average METs 2.16      Resistance Training   Training Prescription Yes    Weight 4 lb    Reps 10-15      Interval Training   Interval Training No      Recumbant Bike   Level 2    Watts 13    Minutes 15    METs 2.83      NuStep   Level 1    Minutes 15    METs 2      Biostep-RELP   Level 3    Minutes 15    METs 2      Track   Laps 15    Minutes 15    METs  1.82      Home Exercise Plan   Plans to continue exercise at Home (comment)   walking   Frequency Add 2 additional days to program exercise sessions.    Initial Home Exercises Provided 05/07/22      Oxygen   Maintain Oxygen Saturation 88% or higher             Functional Capacity:  6 Minute Walk     Row Name 02/22/22 1609 07/30/22 1446       6 Minute Walk   Phase Initial Discharge    Distance 1020 feet 1110 feet    Distance % Change -- 8.8 %    Distance Feet Change -- 90 ft    Walk Time 6 minutes 6 minutes    # of Rest Breaks 0 0    MPH 1.93 2.1    METS 2.07 2.25    RPE 10 12    Perceived Dyspnea  0 0    VO2 Peak 7.27 7.88    Symptoms Yes (comment) No    Comments Right knee pain 3/10 --    Resting HR 62 bpm 81 bpm    Resting BP 98/56 124/64    Resting Oxygen Saturation  98 % 90 %    Exercise Oxygen Saturation  during 6 min walk 96 % --    Max Ex. HR 105 bpm 108 bpm    Max Ex. BP 122/58 128/64    2 Minute Post BP 108/60 --            Nutrition & Weight - Outcomes:  Pre Biometrics - 02/22/22 1554       Pre Biometrics   Height 5' 5.75" (1.67 m)    Weight 164 lb 11.2 oz (74.7 kg)    BMI (Calculated) 26.79    Single Leg Stand 0 seconds             Post Biometrics - 07/30/22 1446        Post  Biometrics    Height 5' 5.75" (1.67 m)    Weight 170 lb 14.4 oz (77.5 kg)    BMI (Calculated) 27.8    Single Leg Stand 0 seconds            Goals reviewed with patient; copy given to patient.

## 2022-07-30 NOTE — Progress Notes (Signed)
Daily Session Note  Patient Details  Name: Danny Marquez MRN: [id number] Date of Birth: [birth year] Referring Provider:   Flowsheet Row Cardiac Rehab from 02/22/2022 in [facility] Cardiac and Pulmonary Rehab  Referring Provider Garth Schlatter MD       Encounter Date: 07/30/2022  Check In:  Session Check In - 07/30/22 1400       Check-In   Supervising physician immediately available to respond to emergencies See telemetry face sheet for immediately available ER MD    Location [facility]-Cardiac & Pulmonary Rehab    Staff Present Renita Papa, RN BSN;Laureen Owens Shark, BS, RRT, CPFT;Megan Tamala Julian, RN, ADN    Virtual Visit No    Medication changes reported     No    Fall or balance concerns reported    No    Warm-up and Cool-down Performed on first and last piece of equipment    Resistance Training Performed Yes    VAD Patient? No    PAD/SET Patient? No      Pain Assessment   Currently in Pain? No/denies                Social History   Tobacco Use  Smoking Status Former   Years: 15.00   Types: Cigarettes  Smokeless Tobacco Never    Goals Met:  Independence with exercise equipment Exercise tolerated well No report of concerns or symptoms today Strength training completed today  Goals Unmet:  Not Applicable  Comments: Pt able to follow exercise prescription today without complaint.  Will continue to monitor for progression.   [place] Name 02/22/22 1609 07/30/22 1446       6 Minute Walk   Phase Initial Discharge    Distance 1020 feet 1110 feet    Distance % Change -- 8.8 %    Distance Feet Change -- 90 ft    Walk Time 6 minutes 6 minutes    # of Rest Breaks 0 0    MPH 1.93 2.1    METS 2.07 2.25    RPE 10 12    Perceived Dyspnea  0 0    VO2 Peak 7.27 7.88    Symptoms Yes (comment) No    Comments Right knee pain 3/10 --    Resting HR 62 bpm 81 bpm    Resting BP 98/56 124/64    Resting Oxygen Saturation  98 % 90 %    Exercise Oxygen  Saturation  during 6 min walk 96 % --    Max Ex. HR 105 bpm 108 bpm    Max Ex. BP 122/58 128/64    2 Minute Post BP 108/60 --              Dr. Emily Filbert is Medical Director for [place].  Dr. Ottie Glazier is Medical Director for [facility] Pulmonary Rehabilitation.

## 2022-08-01 ENCOUNTER — Encounter: Payer: No Typology Code available for payment source | Admitting: *Deleted

## 2022-08-01 DIAGNOSIS — I5022 Chronic systolic (congestive) heart failure: Secondary | ICD-10-CM

## 2022-08-01 NOTE — Progress Notes (Signed)
Daily Session Note  Patient Details  Name: Danny Marquez MRN: [id number] Date of Birth: 06-28-1946 Referring Provider:   Flowsheet Row Cardiac Rehab from 02/22/2022 in [facility] Cardiac and Pulmonary Rehab  Referring Provider Garth Schlatter MD       Encounter Date: 08/01/2022  Check In:  Session Check In - 08/01/22 1447       Check-In   Supervising physician immediately available to respond to emergencies See telemetry face sheet for immediately available ER MD    Location [facility]-Cardiac & Pulmonary Rehab    Staff Present Renita Papa, RN BSN;Laureen Owens Shark, BS, RRT, CPFT;Megan Tamala Julian, RN, ADN    Virtual Visit No    Medication changes reported     No    Fall or balance concerns reported    No    Warm-up and Cool-down Performed on first and last piece of equipment    Resistance Training Performed Yes    VAD Patient? No    PAD/SET Patient? No      Pain Assessment   Currently in Pain? No/denies                Social History   Tobacco Use  Smoking Status Former   Years: 15.00   Types: Cigarettes  Smokeless Tobacco Never    Goals Met:  Independence with exercise equipment Exercise tolerated well No report of concerns or symptoms today Strength training completed today  Goals Unmet:  Not Applicable  Comments: Pt able to follow exercise prescription today without complaint.  Will continue to monitor for progression.    Dr. Emily Filbert is Medical Director for [place].  Dr. Ottie Glazier is Medical Director for [facility] Pulmonary Rehabilitation.

## 2022-08-06 ENCOUNTER — Encounter: Payer: No Typology Code available for payment source | Admitting: *Deleted

## 2022-08-06 DIAGNOSIS — I5022 Chronic systolic (congestive) heart failure: Secondary | ICD-10-CM

## 2022-08-06 NOTE — Progress Notes (Signed)
Daily Session Note  Patient Details  Name: Danny Marquez MRN: [id number] Date of Birth: 1945/12/02 Referring Provider:   Flowsheet Row Cardiac Rehab from 02/22/2022 in [facility] Cardiac and Pulmonary Rehab  Referring Provider Garth Schlatter MD       Encounter Date: 08/06/2022  Check In:  Session Check In - 08/06/22 1425       Check-In   Supervising physician immediately available to respond to emergencies See telemetry face sheet for immediately available ER MD    Location [facility]-Cardiac & Pulmonary Rehab    Staff Present Carson Myrtle, BS, RRT, CPFT;Chaneka Trefz Sherryll Burger, RN Odelia Gage, RN, ADN    Virtual Visit No    Medication changes reported     No    Fall or balance concerns reported    No    Warm-up and Cool-down Performed on first and last piece of equipment    Resistance Training Performed Yes    VAD Patient? No    PAD/SET Patient? No      Pain Assessment   Currently in Pain? No/denies                Social History   Tobacco Use  Smoking Status Former   Years: 15.00   Types: Cigarettes  Smokeless Tobacco Never    Goals Met:  Independence with exercise equipment Exercise tolerated well No report of concerns or symptoms today Strength training completed today  Goals Unmet:  Not Applicable  Comments: Pt able to follow exercise prescription today without complaint.  Will continue to monitor for progression.    Dr. Emily Filbert is Medical Director for [place].  Dr. Ottie Glazier is Medical Director for [facility] Pulmonary Rehabilitation.

## 2022-08-08 ENCOUNTER — Encounter: Payer: Self-pay | Admitting: *Deleted

## 2022-08-08 ENCOUNTER — Encounter: Payer: No Typology Code available for payment source | Admitting: *Deleted

## 2022-08-08 DIAGNOSIS — I5022 Chronic systolic (congestive) heart failure: Secondary | ICD-10-CM

## 2022-08-08 NOTE — Progress Notes (Signed)
Cardiac Individual Treatment Plan  Patient Details  Name: Danny Marquez MRN: [id number] Date of Birth: 08/20/1946 Referring Provider:   Flowsheet Row Cardiac Rehab from 02/22/2022 in [facility] Cardiac and Pulmonary Rehab  Referring Provider Garth Schlatter MD       Initial Encounter Date:  Flowsheet Row Cardiac Rehab from 02/22/2022 in [facility] Cardiac and Pulmonary Rehab  Date 02/22/22       Visit Diagnosis: Heart failure, chronic systolic ([place])  Patient's Home Medications on Admission:  Current Outpatient Medications:    allopurinol (ZYLOPRIM) 100 MG tablet, Take 200 mg by mouth daily., Disp: , Rfl:    amiodarone (PACERONE) 200 MG tablet, Take 100 mg by mouth 2 (two) times daily., Disp: , Rfl:    apixaban (ELIQUIS) 5 MG TABS tablet, Take 5 mg by mouth 2 (two) times daily., Disp: , Rfl:    apixaban (ELIQUIS) 5 MG TABS tablet, Take 5 mg by mouth 2 (two) times daily., Disp: , Rfl:    cefdinir (OMNICEF) 300 MG capsule, Take 1 capsule (300 mg total) by mouth 2 (two) times daily., Disp: 20 capsule, Rfl: 0   DONEPEZIL HCL PO, Take 10 mg by mouth., Disp: , Rfl:    empagliflozin (JARDIANCE) 25 MG TABS tablet, Take 12.5 mg by mouth daily., Disp: , Rfl:    HYDROcodone-acetaminophen (NORCO/VICODIN) 5-325 MG tablet, Take 1 tablet by mouth every 6 (six) hours as needed., Disp: 10 tablet, Rfl: 0   pregabalin (LYRICA) 50 MG capsule, Take 50 mg in the morning, 50 mg in the afternoon, and 100 mg at night, Disp: , Rfl:    ranolazine (RANEXA) 500 MG 12 hr tablet, Take 500 mg by mouth 2 (two) times daily., Disp: , Rfl:    rosuvastatin (CRESTOR) 5 MG tablet, , Disp: , Rfl:    sertraline (ZOLOFT) 100 MG tablet, Take 100 mg by mouth daily. , Disp: , Rfl:    spironolactone (ALDACTONE) 25 MG tablet, Take 25 mg by mouth daily., Disp: , Rfl:    torsemide (DEMADEX) 20 MG tablet, Take 20 mg by mouth daily., Disp: , Rfl:    TRAZODONE HCL PO, Take 75 mg by mouth., Disp: , Rfl:   Past Medical History: Past  Medical History:  Diagnosis Date   A-fib ([place])    CHF (congestive heart failure) (HCC)    EF 30%   Chronic kidney disease    congenital one kidney   Hypertension    Myocardial infarction ([place])    Solitary kidney     Tobacco Use: Social History   Tobacco Use  Smoking Status Former   Years: 15.00   Types: Cigarettes  Smokeless Tobacco Never    Labs: Review Flowsheet        No data to display           Exercise Target Goals: Exercise Program Goal: Individual exercise prescription set using results from initial 6 min walk test and THRR while considering  patient's activity barriers and safety.   Exercise Prescription Goal: Initial exercise prescription builds to 30-45 minutes a day of aerobic activity, 2-3 days per week.  Home exercise guidelines will be given to patient during program as part of exercise prescription that the participant will acknowledge.   Education: Aerobic Exercise: - Group verbal and visual presentation on the components of exercise prescription. Introduces F.I.T.T principle from ACSM for exercise prescriptions.  Reviews F.I.T.T. principles of aerobic exercise including progression. Written material given at graduation. Flowsheet Row Cardiac Rehab from 04/18/2022 in [facility] Cardiac  and Pulmonary Rehab  Education need identified 02/22/22       Education: Resistance Exercise: - Group verbal and visual presentation on the components of exercise prescription. Introduces F.I.T.T principle from ACSM for exercise prescriptions  Reviews F.I.T.T. principles of resistance exercise including progression. Written material given at graduation.    Education: Exercise & Equipment Safety: - Individual verbal instruction and demonstration of equipment use and safety with use of the equipment. Flowsheet Row Cardiac Rehab from 04/18/2022 in [facility] Cardiac and Pulmonary Rehab  Education need identified 02/22/22  Date 02/22/22  Educator St[place]  Instruction Review Code 1-  Verbalizes Understanding       Education: Exercise Physiology & General Exercise Guidelines: - Group verbal and written instruction with models to review the exercise physiology of the cardiovascular system and associated critical values. Provides general exercise guidelines with specific guidelines to those with heart or lung disease.    Education: Flexibility, Balance, Mind/Body Relaxation: - Group verbal and visual presentation with interactive activity on the components of exercise prescription. Introduces F.I.T.T principle from ACSM for exercise prescriptions. Reviews F.I.T.T. principles of flexibility and balance exercise training including progression. Also discusses the mind body connection.  Reviews various relaxation techniques to help reduce and manage stress (i.e. Deep breathing, progressive muscle relaxation, and visualization). Balance handout provided to take home. Written material given at graduation.   Activity Barriers & Risk Stratification:  Activity Barriers & Cardiac Risk Stratification - 02/22/22 1555       Activity Barriers & Cardiac Risk Stratification   Activity Barriers Balance Concerns;Other (comment);Back Problems;Deconditioning;Right Knee Replacement;Left Knee Replacement;Muscular Weakness;History of Falls;Joint Problems    Comments neuropathy, multiple back surgeries, dizziness    Cardiac Risk Stratification High             6 Minute Walk:  6 Minute Walk     Row Name 02/22/22 1609 07/30/22 1446       6 Minute Walk   Phase Initial Discharge    Distance 1020 feet 1110 feet    Distance % Change -- 8.8 %    Distance Feet Change -- 90 ft    Walk Time 6 minutes 6 minutes    # of Rest Breaks 0 0    MPH 1.93 2.1    METS 2.07 2.25    RPE 10 12    Perceived Dyspnea  0 0    VO2 Peak 7.27 7.88    Symptoms Yes (comment) No    Comments Right knee pain 3/10 --    Resting HR 62 bpm 81 bpm    Resting BP 98/56 124/64    Resting Oxygen Saturation  98 % 90  %    Exercise Oxygen Saturation  during 6 min walk 96 % --    Max Ex. HR 105 bpm 108 bpm    Max Ex. BP 122/58 128/64    2 Minute Post BP 108/60 --             Oxygen Initial Assessment:   Oxygen Re-Evaluation:   Oxygen Discharge (Final Oxygen Re-Evaluation):   Initial Exercise Prescription:  Initial Exercise Prescription - 02/22/22 1600       Date of Initial Exercise RX and Referring Provider   Date 02/22/22    Referring Provider Garth Schlatter MD      Oxygen   Maintain Oxygen Saturation 88% or higher      Recumbant Bike   Level 1    RPM 60    Watts 10  Minutes 15    METs 2      NuStep   Level 1    SPM 80    Minutes 15    METs 2      Biostep-RELP   Level 1    SPM 50    Minutes 15    METs 2      Track   Laps 14    Minutes 15    METs 1.7      Prescription Details   Frequency (times per week) 3    Duration Progress to 30 minutes of continuous aerobic without signs/symptoms of physical distress      Intensity   THRR 40-80% of Max Heartrate 94- 127    Ratings of Perceived Exertion 11-13    Perceived Dyspnea 0-4      Progression   Progression Continue to progress workloads to maintain intensity without signs/symptoms of physical distress.      Resistance Training   Training Prescription Yes    Weight 4 lb    Reps 10-15             Perform Capillary Blood Glucose checks as needed.  Exercise Prescription Changes:   Exercise Prescription Changes     Row Name 02/22/22 1600 04/11/22 1200 04/24/22 1500 05/07/22 1500 07/03/22 1500     Response to Exercise   Blood Pressure (Admit) 98/56 122/62 100/60 102/56 116/62   Blood Pressure (Exercise) 122/58 128/66 110/62 122/60 125/72   Blood Pressure (Exit) 108/60 118/60 102/62 108/58 114/64   Heart Rate (Admit) 62 bpm 78 bpm 84 bpm 97 bpm 94 bpm   Heart Rate (Exercise) 105 bpm 116 bpm 100 bpm 94 bpm 111 bpm   Heart Rate (Exit) 64 bpm 69 bpm 88 bpm 93 bpm 91 bpm   Oxygen Saturation (Admit) 98  % -- -- -- --   Oxygen Saturation (Exercise) 96 % -- -- -- --   Oxygen Saturation (Exit) 96 % -- -- -- --   Rating of Perceived Exertion (Exercise) _0 Perceived Dyspnea (Exercise) 0 -- -- -- --   Symptoms Right knee pain 3/10 none none none none   Comments walk test results 1st full day of exercise run of vtach- asymptomatic none return back post ventricular abalation   Duration -- Progress to 30 minutes of  aerobic without signs/symptoms of physical distress Continue with 30 min of aerobic exercise without signs/symptoms of physical distress. Continue with 30 min of aerobic exercise without signs/symptoms of physical distress. Continue with 30 min of aerobic exercise without signs/symptoms of physical distress.   Intensity -- THRR unchanged THRR unchanged THRR unchanged THRR unchanged     Progression   Progression -- Continue to progress workloads to maintain intensity without signs/symptoms of physical distress. Continue to progress workloads to maintain intensity without signs/symptoms of physical distress. Continue to progress workloads to maintain intensity without signs/symptoms of physical distress. Continue to progress workloads to maintain intensity without signs/symptoms of physical distress.   Average METs -- 1.82 1.92 2.81 2.43     Resistance Training   Training Prescription -- Yes Yes Yes Yes   Weight -- 4 lb 4 lb 4 lb 4 lb   Reps -- 10-15 10-15 10-15 10-15     Interval Training   Interval Training -- -- No No No     Recumbant Bike   Level -- -- _1 Watts -- -- -- 16 22   Minutes -- --  _0 METs -- -- 2.42 2.63 2.92     NuStep   Level -- 3 -- -- --   Minutes -- 15 -- -- --   METs -- 2.5 -- -- --     T5 Nustep   Level -- -- 1 -- --   Minutes -- -- 15 -- --     Biostep-RELP   Level -- _1 Minutes -- _2 METs -- _3 Track   Laps -- -- 14 -- 15   Minutes -- -- 15 -- 15   METs -- -- 1.76 -- 1.82     Home Exercise  Plan   Plans to continue exercise at -- -- -- Home (comment)  walking Home (comment)  walking   Frequency -- -- -- Add 2 additional days to program exercise sessions. Add 2 additional days to program exercise sessions.   Initial Home Exercises Provided -- -- -- 05/07/22 05/07/22     Oxygen   Maintain Oxygen Saturation -- 88% or higher 88% or higher 88% or higher 88% or higher    Row Name 07/17/22 1500 07/31/22 1400           Response to Exercise   Blood Pressure (Admit) 132/70 122/72      Blood Pressure (Exit) 96/56 96/52      Heart Rate (Admit) 82 bpm 71 bpm      Heart Rate (Exercise) 100 bpm 82 bpm      Heart Rate (Exit) 86 bpm 76 bpm      Rating of Perceived Exertion (Exercise) 15 14      Symptoms none none      Comments -- back & hip pain      Duration Continue with 30 min of aerobic exercise without signs/symptoms of physical distress. Continue with 30 min of aerobic exercise without signs/symptoms of physical distress.      Intensity THRR unchanged THRR unchanged        Progression   Progression Continue to progress workloads to maintain intensity without signs/symptoms of physical distress. Continue to progress workloads to maintain intensity without signs/symptoms of physical distress.      Average METs 2.16 2.14        Resistance Training   Training Prescription Yes Yes      Weight 4 lb 5 lb      Reps 10-15 10-15        Interval Training   Interval Training No No        Recumbant Bike   Level 2 5      Watts 13 20      Minutes 15 15      METs 2.83 2.2        NuStep   Level 1 --      Minutes 15 --      METs 2 --        Biostep-RELP   Level 3 2      Minutes 15 15      METs 2 2        Track   Laps 15 20  = 16 track      Minutes 15 15      METs 1.82 1.87        Home Exercise Plan   Plans to continue exercise at Home (comment)  walking Home (comment)  walking      Frequency  Add 2 additional days to program exercise sessions. Add 2 additional days to  program exercise sessions.      Initial Home Exercises Provided 05/07/22 05/07/22        Oxygen   Maintain Oxygen Saturation 88% or higher 88% or higher               Exercise Comments:   Exercise Comments     Row Name 02/26/22 1404           Exercise Comments First full day of exercise!  Patient was oriented to gym and equipment including functions, settings, policies, and procedures.  Patient's individual exercise prescription and treatment plan were reviewed.  All starting workloads were established based on the results of the 6 minute walk test done at initial orientation visit.  The plan for exercise progression was also introduced and progression will be customized based on patient's performance and goals.                Exercise Goals and Review:   Exercise Goals     Row Name 02/22/22 1634             Exercise Goals   Increase Physical Activity Yes       Intervention Provide advice, education, support and counseling about physical activity/exercise needs.;Develop an individualized exercise prescription for aerobic and resistive training based on initial evaluation findings, risk stratification, comorbidities and participant's personal goals.       Expected Outcomes Short Term: Attend rehab on a regular basis to increase amount of physical activity.;Long Term: Add in home exercise to make exercise part of routine and to increase amount of physical activity.;Long Term: Exercising regularly at least 3-5 days a week.       Increase Strength and Stamina Yes       Intervention Provide advice, education, support and counseling about physical activity/exercise needs.;Develop an individualized exercise prescription for aerobic and resistive training based on initial evaluation findings, risk stratification, comorbidities and participant's personal goals.       Expected Outcomes Short Term: Increase workloads from initial exercise prescription for resistance, speed, and  METs.;Short Term: Perform resistance training exercises routinely during rehab and add in resistance training at home;Long Term: Improve cardiorespiratory fitness, muscular endurance and strength as measured by increased METs and functional capacity (6MWT)       Able to understand and use rate of perceived exertion (RPE) scale Yes       Intervention Provide education and explanation on how to use RPE scale       Expected Outcomes Short Term: Able to use RPE daily in rehab to express subjective intensity level;Long Term:  Able to use RPE to guide intensity level when exercising independently       Able to understand and use Dyspnea scale Yes       Intervention Provide education and explanation on how to use Dyspnea scale       Expected Outcomes Short Term: Able to use Dyspnea scale daily in rehab to express subjective sense of shortness of breath during exertion;Long Term: Able to use Dyspnea scale to guide intensity level when exercising independently       Knowledge and understanding of Target Heart Rate Range (THRR) Yes       Intervention Provide education and explanation of THRR including how the numbers were predicted and where they are located for reference       Expected Outcomes Short Term: Able to state/look up THRR;Long Term: Able to  use THRR to govern intensity when exercising independently;Short Term: Able to use daily as guideline for intensity in rehab       Able to check pulse independently Yes       Intervention Provide education and demonstration on how to check pulse in carotid and radial arteries.;Review the importance of being able to check your own pulse for safety during independent exercise       Expected Outcomes Short Term: Able to explain why pulse checking is important during independent exercise;Long Term: Able to check pulse independently and accurately       Understanding of Exercise Prescription Yes       Intervention Provide education, explanation, and written materials  on patient's individual exercise prescription       Expected Outcomes Short Term: Able to explain program exercise prescription;Long Term: Able to explain home exercise prescription to exercise independently                Exercise Goals Re-Evaluation :  Exercise Goals Re-Evaluation     Row Name 02/26/22 1404 04/09/22 1430 04/11/22 1204 04/24/22 1509 05/07/22 1430     Exercise Goal Re-Evaluation   Exercise Goals Review Increase Physical Activity;Able to understand and use rate of perceived exertion (RPE) scale;Understanding of Exercise Prescription;Knowledge and understanding of Target Heart Rate Range (THRR);Increase Strength and Stamina;Able to understand and use Dyspnea scale;Able to check pulse independently Increase Physical Activity;Understanding of Exercise Prescription;Increase Strength and Stamina Increase Physical Activity;Understanding of Exercise Prescription;Increase Strength and Stamina Increase Physical Activity;Understanding of Exercise Prescription;Increase Strength and Stamina Increase Physical Activity;Understanding of Exercise Prescription;Increase Strength and Stamina   Comments Reviewed RPE and dyspnea scales, THR and program prescription with pt today.  Pt voiced understanding and was given a copy of goals to take home. Danny Marquez is really enjoying the program. He wants to return to a more active lifestyle and increase his strength and stamina. He states that he is currently doing some bodyweight exercises at home given to hime by [employer]. He wants to return to being able to do the yard work he likes to do. He states that he is walking at least a mile everyday as well for endurance training. Danny Marquez is off to a good start in rehab. He had an average overall MET level of 1.82 METs. He also got up to level 3 on the T4. He tolerated using 4 lb hand weights for resistance training as well. We will continue to monitor his progress in the program. Danny Marquez has been coming to rehab  pretty consistently. He did have several runs of non-sustained v tach episodes dated on 8/2 where he was completely asymptomatic. He saw his EP doctor and they did an interrogation and lowered his rate. Other than that, he has been walking the track averaging around 14 laps. Biostep level has increased to 3. We will slowly progress based on how his rhythm looks and how he feels. Per his wife, he is supposed to be getting a ventricular ablation in September, where we will need to place him on medical hold with return with clearance. We will continue to monitor. Reviewed home exercise with pt today.  Pt plans to walk around his neighborhood for exercise. He will also use his personal hand weights for resistance training at home. Emphasized exercising with supervision. Reviewed THR, pulse, RPE, sign and symptoms, pulse oximetery and when to call 911 or MD.  Also discussed weather considerations and indoor options.  Pt voiced understanding.   Expected Outcomes Short: Use  RPE daily to regulate intensity. Long: Follow program prescription in THR. Short: Continue to walk at home. Long: Continue to improve strength and stamina. Short: Continue to attend rehab and follow exercise prescription. Long: Continue to improve overall MET levels. Short: Watch rhythm and report any symptoms, continue walking on the track Long: Continue to increase overall strength and stamina Short: Begin walking at home on days away from rehab. Long: Continue to increase overall strength and stamina.    [place] Name 06/07/22 0813 07/03/22 1514 07/17/22 1510 07/30/22 1441 07/31/22 1414     Exercise Goal Re-Evaluation   Exercise Goals Review Increase Physical Activity;Understanding of Exercise Prescription;Increase Strength and Stamina Increase Physical Activity;Understanding of Exercise Prescription;Increase Strength and Stamina Increase Physical Activity;Understanding of Exercise Prescription;Increase Strength and Stamina Increase Physical  Activity;Understanding of Exercise Prescription;Increase Strength and Stamina Increase Physical Activity;Understanding of Exercise Prescription;Increase Strength and Stamina   Comments Patient has not been here since last review as he had a VT ablation complete. Patient should be cleared and will return to rehab in a couple of weeks. Danny Marquez has returned since his ablation and doing well in rehab. We eased him back into exercise and he is slowly starting to build it back up. He did try out level 3 on the recumbent bike and worked up to 22 watts which is the highest he has had yet! He was also able to walk a full 15 laps on the track. We will continue to monitor. Danny Marquez is doing well in rehab. He is still easing back into exercise and he is slowly starting to build it back up. He did get back up to level 3 on the biostep. He has also continued to walk a full 15 laps on the track. We will continue to monitor his progress in the program. Danny Marquez is doing well in rehab. He is doing his PT exercises and walking at home 2x a day to build up strenght. He does not how much better he is feeling overall since starting rehab.  He improved his post walk by 90 ft!! Danny Marquez continues to do well in rehab.  He's got a couple weeks left until he graduates. He increased to level 5 on the recumbent bike and improved on his post 6MWT! He also is now using 5 lb for handweights. We will continue to monitor until he graduates.   Expected Outcomes Short:Clear and return to rehab Long: Complete [facility] Program Short: Continue to work up laps on track Long: Continue to build up strength and stamina Short: Continue to work up laps on track Long: Continue to increase strength and stamina Short:Graduate Long: continue to exercise independently Short: Graduate Long: Continue to build up overall strength and stamina            Discharge Exercise Prescription (Final Exercise Prescription Changes):  Exercise Prescription Changes -  07/31/22 1400       Response to Exercise   Blood Pressure (Admit) 122/72    Blood Pressure (Exit) 96/52    Heart Rate (Admit) 71 bpm    Heart Rate (Exercise) 82 bpm    Heart Rate (Exit) 76 bpm    Rating of Perceived Exertion (Exercise) 14    Symptoms none    Comments back & hip pain    Duration Continue with 30 min of aerobic exercise without signs/symptoms of physical distress.    Intensity THRR unchanged      Progression   Progression Continue to progress workloads to maintain intensity without signs/symptoms of physical  distress.    Average METs 2.14      Resistance Training   Training Prescription Yes    Weight 5 lb    Reps 10-15      Interval Training   Interval Training No      Recumbant Bike   Level 5    Watts 20    Minutes 15    METs 2.2      Biostep-RELP   Level 2    Minutes 15    METs 2      Track   Laps 20   = 16 track   Minutes 15    METs 1.87      Home Exercise Plan   Plans to continue exercise at Home (comment)   walking   Frequency Add 2 additional days to program exercise sessions.    Initial Home Exercises Provided 05/07/22      Oxygen   Maintain Oxygen Saturation 88% or higher             Nutrition:  Target Goals: Understanding of nutrition guidelines, daily intake of sodium <1540m, cholesterol <2042m calories 30% from fat and 7% or less from saturated fats, daily to have 5 or more servings of fruits and vegetables.  Education: All About Nutrition: -Group instruction provided by verbal, written material, interactive activities, discussions, models, and posters to present general guidelines for heart healthy nutrition including fat, fiber, MyPlate, the role of sodium in heart healthy nutrition, utilization of the nutrition label, and utilization of this knowledge for meal planning. Follow up email sent as well. Written material given at graduation. Flowsheet [facility]  Education  need identified 02/22/22       Biometrics:  Pre Biometrics - 02/22/22 1554       Pre Biometrics   Height 5' 5.75" (1.67 m)    Weight 164 lb 11.2 oz (74.7 kg)    BMI (Calculated) 26.79    Single Leg Stand 0 seconds             Post Biometrics - 07/30/22 1446        Post  Biometrics   Height 5' 5.75" (1.67 m)    Weight 170 lb 14.4 oz (77.5 kg)    BMI (Calculated) 27.8    Single Leg Stand 0 seconds             Nutrition Therapy Plan and Nutrition Goals:  Nutrition Therapy & Goals - 04/23/22 1507       Nutrition Therapy   RD appointment deferred Yes   Pt would like to defer speaking about nutrition, but may want to in the future. Will continue to follow up.     Personal Nutrition Goals   Nutrition Goal Pt would like to defer speaking about nutrition, but may want to in the future. Will continue to follow up.             Nutrition Assessments:  MEDIFICTS Score Key: ?70 Need to make dietary changes  40-70 Heart Healthy Diet ? 40 Therapeutic Level Cholesterol Diet  [facility]  Picture Your Plate Total Score on Admission 62      Picture Your Plate Scores: <4<82nhealthy dietary pattern with much room for improvement. 41-50 Dietary pattern unlikely to meet recommendations for good health and room for improvement. 51-60 More healthful dietary pattern, with some room for improvement.  >60 Healthy dietary  pattern, although there may be some specific behaviors that could be improved.    Nutrition Goals Re-Evaluation:  Nutrition Goals Re-Evaluation     [place] Name 04/09/22 1443 07/09/22 1409 07/30/22 1438         Goals   Nutrition Goal -- -- Continues to defer appt     Comment Osby does not want to meet with the RD at this time, but would be open to speaking with her in the future. Neco does not want to meet with the dietitian at this time. Flor continues to defer appt.  He is  doing well with his diet as his wife stays on top of his diet.  His weakness is candy and enjoys it.     Expected Outcome Short: Plan to meet with RD Long: Schedule meeting with RD -- Continue to follow heart healthy diet              Nutrition Goals Discharge (Final Nutrition Goals Re-Evaluation):  Nutrition Goals Re-Evaluation - 07/30/22 1438       Goals   Nutrition Goal Continues to defer appt    Comment Verlie continues to defer appt.  He is doing well with his diet as his wife stays on top of his diet.  His weakness is candy and enjoys it.    Expected Outcome Continue to follow heart healthy diet             Psychosocial: Target Goals: Acknowledge presence or absence of significant depression and/or stress, maximize coping skills, provide positive support system. Participant is able to verbalize types and ability to use techniques and skills needed for reducing stress and depression.   Education: Stress, Anxiety, and Depression - Group verbal and visual presentation to define topics covered.  Reviews how body is impacted by stress, anxiety, and depression.  Also discusses healthy ways to reduce stress and to treat/manage anxiety and depression.  Written material given at graduation.   Education: Sleep Hygiene -Provides group verbal and written instruction about how sleep can affect your health.  Define sleep hygiene, discuss sleep cycles and impact of sleep habits. Review good sleep hygiene tips.    Initial Review & Psychosocial Screening:  Initial Psych Review & Screening - 02/12/22 1533       Initial Review   Current issues with None Identified      Family Dynamics   Good Support System? Yes   wife,     Barriers   Psychosocial barriers to participate in program There are no identifiable barriers or psychosocial needs.      Screening Interventions   Interventions Encouraged to exercise    Expected Outcomes Short Term goal: Utilizing psychosocial counselor,  staff and physician to assist with identification of specific Stressors or current issues interfering with healing process. Setting desired goal for each stressor or current issue identified.;Long Term Goal: Stressors or current issues are controlled or eliminated.;Short Term goal: Identification and review with participant of any Quality of Life or Depression concerns found by scoring the questionnaire.;Long Term goal: The participant improves quality of Life and PHQ9 Scores as seen by post scores and/or verbalization of changes             Quality of Life Scores:   Quality of Life - 02/22/22 1556       Quality of Life   Select Quality of Life      Quality of Life Scores   Health/Function Pre 8.3 %    Socioeconomic Pre 25.19 %  Psych/Spiritual Pre 22.71 %    Family Pre 30 %    GLOBAL Pre 18.14 %            Scores of 19 and below usually indicate a poorer quality of life in these areas.  A difference of  2-3 points is a clinically meaningful difference.  A difference of 2-3 points in the total score of the Quality of Life Index has been associated with significant improvement in overall quality of life, self-image, physical symptoms, and general health in studies assessing change in quality of life.  PHQ-9: Review Flowsheet       04/09/2022 02/22/2022  Depression screen PHQ 2/9  Decreased Interest 0 0  Down, Depressed, Hopeless 0 1  PHQ - 2 Score 0 1  Altered sleeping 0 2  Tired, decreased energy 3 2  Change in appetite 0 0  Feeling bad or failure about yourself  0 0  Trouble concentrating 0 0  Moving slowly or fidgety/restless 0 1  Suicidal thoughts 0 0  PHQ-9 Score 3 6  Difficult doing work/chores Not difficult at all Not difficult at all   Interpretation of Total Score  Total Score Depression Severity:  1-4 = Minimal depression, 5-9 = Mild depression, 10-14 = Moderate depression, 15-19 = Moderately severe depression, 20-27 = Severe depression   Psychosocial  Evaluation and Intervention:  Psychosocial Evaluation - 02/12/22 1554       Psychosocial Evaluation & Interventions   Comments Danny Marquez has no barriers to attending the program. He lives with his wife and she is his support. He wants to get healthy again and return to his ability to [place] yard s and weed the garden. 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 The threshold has been decreased to about 150 beats as of last month when he had several episodes of VTach. 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 He has had several falls, from the neuropathy and from dizzy spells.  He understands that we will not have him on the treadmill to start. He is ready to get started    Expected Outcomes STG Danny Marquez attends all scheduled sessions, he is able to progress with his exercise to return to his lawn chores without too much effort. LTG Danny Marquez continues to progress with his day to day activity.    Continue Psychosocial Services  Follow up required by staff             Psychosocial Re-Evaluation:  Psychosocial Re-Evaluation     [place] Name 04/09/22 1437 05/07/22 1409 07/09/22 1420 07/30/22 1439       Psychosocial Re-Evaluation   Current issues with Current Stress Concerns;Current Sleep Concerns None Identified None Identified;Current Stress Concerns Current Stress Concerns    Comments Danny Marquez states that he has been frustrated because he feels he is not as healthy as used to be. He is motivated to work hard to get back to feeling healthy and fit. He also states that exercise is a good stress reliever for him. He states that his wife, son, and friends are a good support system for him and keep him motivated. He states that he is having some issues with his sleep due to some medication changes. Danny Marquez denies any current stressors at this time. He states that exercise has been a good stress reliever for him, but he does get frustrated when he is not performing as well as he would  like during exercise. He states  that he has a great support system around him made up mainly by his wife, but also his children and grandchildren. He also reports that after speaking with his doctor and changing some of his medications he is sleeping much better. Danny Marquez states that he does not have any mental issues at the moment. He is in a lot of back pain daily which causes him to have some stress. He does not take anything for anxiety or depression. Danny Marquez is doing well in rehab.  He is feeling good mentally and stays positive.  His health is his biggest stressor but he tries not to let it slow him down too much.  He continues to sleep well.    Expected Outcomes Short: Continue to attend rehab for stress relief. Long: Maintain positive outlook. Short: Continue to attend rehab for stress relief. Long: Maintain positive outlook. Short: Attend [facility] stress management education to decrease stress. Long: Maintain exercise [facility] to keep stress at a minimum. Continue to exercise for mental boost and stay positive    Interventions -- Encouraged to attend Cardiac Rehabilitation for the exercise Encouraged to attend Cardiac Rehabilitation for the exercise Encouraged to attend Cardiac Rehabilitation for the exercise    Continue Psychosocial Services  Follow up required by staff Follow up required by staff Follow up required by staff --      Initial Review   Source of Stress Concerns -- None Identified -- --             Psychosocial Discharge (Final Psychosocial Re-Evaluation):  Psychosocial Re-Evaluation - 07/30/22 1439       Psychosocial Re-Evaluation   Current issues with Current Stress Concerns    Comments Danny Marquez is doing well in rehab.  He is feeling good mentally and stays positive.  His health is his biggest stressor but he tries not to let it slow him down too much.  He continues to sleep well.    Expected Outcomes Continue to exercise for mental boost and stay positive     Interventions Encouraged to attend Cardiac Rehabilitation for the exercise             Vocational Rehabilitation: Provide vocational rehab assistance to qualifying candidates.   Vocational Rehab Evaluation & Intervention:   Education: Education Goals: Education classes will be provided on a variety of topics geared toward better understanding of heart health and risk factor modification. Participant will state understanding/return demonstration of topics presented as noted by education test scores.  Learning Barriers/Preferences:   General Cardiac Education Topics:  AED/CPR: - Group verbal and written instruction with the use of models to demonstrate the basic use of the AED with the basic ABC's of resuscitation.   Anatomy and Cardiac Procedures: - Group verbal and visual presentation and models provide information about basic cardiac anatomy and function. Reviews the testing methods done to diagnose heart disease and the outcomes of the test results. Describes the treatment choices: Medical Management, Angioplasty, or Coronary Bypass Surgery for treating various heart conditions including Myocardial Infarction, Angina, Valve Disease, and Cardiac Arrhythmias.  Written material given at graduation. Flowsheet Row Cardiac Rehab from 04/18/2022 in [facility] Cardiac and Pulmonary Rehab  Education need identified 02/22/22       Medication Safety: - Group verbal and visual instruction to review commonly prescribed medications for heart and lung disease. Reviews the medication, class of the drug, and side effects. Includes the steps to properly store meds and maintain the prescription regimen.  Written material given at graduation.  Intimacy: - Group verbal instruction through game format to discuss how heart and lung disease can affect sexual intimacy. Written material given at graduation..   Know Your Numbers and Heart Failure: - Group verbal and visual instruction to discuss disease  risk factors for cardiac and pulmonary disease and treatment options.  Reviews associated critical values for Overweight/Obesity, Hypertension, Cholesterol, and Diabetes.  Discusses basics of heart failure: signs/symptoms and treatments.  Introduces Heart Failure Zone chart for action plan for heart failure.  Written material given at graduation. Flowsheet Row Cardiac Rehab from 04/18/2022 in [facility] Cardiac and Pulmonary Rehab  Education need identified 02/22/22  Date 04/18/22  Educator SB  Instruction Review Code 1- Verbalizes Understanding       Infection Prevention: - Provides verbal and written material to individual with discussion of infection control including proper hand washing and proper equipment cleaning during exercise session. Flowsheet Row Cardiac Rehab from 04/18/2022 in [facility] Cardiac and Pulmonary Rehab  Education need identified 02/22/22  Date 02/22/22  Educator [place]  Instruction Review Code 1- Verbalizes Understanding       Falls Prevention: - Provides verbal and written material to individual with discussion of falls prevention and safety. Flowsheet Row Cardiac Rehab from 02/12/2022 in [facility] Cardiac and Pulmonary Rehab  Date 02/12/22  Educator SB  Instruction Review Code 1- Verbalizes Understanding       Other: -Provides group and verbal instruction on various topics (see comments)   Knowledge Questionnaire Score:  Knowledge Questionnaire Score - 02/22/22 1556       Knowledge Questionnaire Score   Pre Score 20/26             Core Components/Risk Factors/Patient Goals at Admission:  Personal Goals and Risk Factors at Admission - 02/22/22 1635       Core Components/Risk Factors/Patient Goals on Admission    Weight Management Yes;Weight Maintenance    Intervention Weight Management: Develop a combined nutrition and exercise program designed to reach desired caloric intake, while maintaining appropriate intake of nutrient and fiber, sodium and fats, and  appropriate energy expenditure required for the weight goal.;Weight Management: Provide education and appropriate resources to help participant work on and attain dietary goals.;Weight Management/Obesity: Establish reasonable short term and long term weight goals.    Admit Weight 164 lb (74.4 kg)    Goal Weight: Short Term 164 lb (74.4 kg)    Goal Weight: Long Term 164 lb (74.4 kg)    Expected Outcomes Short Term: Continue to assess and modify interventions until short term weight is achieved;Long Term: Adherence to nutrition and physical activity/exercise program aimed toward attainment of established weight goal;Weight Maintenance: Understanding of the daily nutrition guidelines, which includes 25-35% calories from fat, 7% or less cal from saturated fats, less than 270m cholesterol, less than 1.5gm of sodium, & 5 or more servings of fruits and vegetables daily;Understanding recommendations for meals to include 15-35% energy as protein, 25-35% energy from fat, 35-60% energy from carbohydrates, less than 2048mof dietary cholesterol, 20-35 gm of total fiber daily;Understanding of distribution of calorie intake throughout the day with the consumption of 4-5 meals/snacks    Heart Failure Yes    Intervention Provide a combined exercise and nutrition program that is supplemented with education, support and counseling about heart failure. Directed toward relieving symptoms such as shortness of breath, decreased exercise tolerance, and extremity edema.    Expected Outcomes Improve functional capacity of life;Short term: Attendance in program 2-3 days a week with increased exercise capacity. Reported lower  sodium intake. Reported increased fruit and vegetable intake. Reports medication compliance.;Short term: Daily weights obtained and reported for increase. Utilizing diuretic protocols set by physician.;Long term: Adoption of self-care skills and reduction of barriers for early signs and symptoms recognition and  intervention leading to self-care maintenance.    Hypertension Yes    Intervention Provide education on lifestyle modifcations including regular physical activity/exercise, weight management, moderate sodium restriction and increased consumption of fresh fruit, vegetables, and low fat dairy, alcohol moderation, and smoking cessation.;Monitor prescription use compliance.    Expected Outcomes Short Term: Continued assessment and intervention until BP is < 140/36m HG in hypertensive participants. < 130/84mHG in hypertensive participants with diabetes, heart failure or chronic kidney disease.;Long Term: Maintenance of blood pressure at goal levels.    Lipids Yes    Intervention Provide education and support for participant on nutrition & aerobic/resistive exercise along with prescribed medications to achieve LDL <7055mHDL >6m38m  Expected Outcomes Short Term: Participant states understanding of desired cholesterol values and is compliant with medications prescribed. Participant is following exercise prescription and nutrition guidelines.;Long Term: Cholesterol controlled with medications as prescribed, with individualized exercise RX and with personalized nutrition plan. Value goals: LDL < 70mg51mL > 40 mg.             Education:Diabetes - Individual verbal and written instruction to review signs/symptoms of diabetes, desired ranges of glucose level fasting, after meals and with exercise. Acknowledge that pre and post exercise glucose checks will be done for 3 sessions at entry of program.   Core Components/Risk Factors/Patient Goals Review:   Goals and Risk Factor Review     Row Name 04/09/22 1445 05/07/22 1413 07/09/22 1414 07/30/22 1415       Core Components/Risk Factors/Patient Goals Review   Personal Goals Review Weight Management/Obesity;Hypertension Weight Management/Obesity;Hypertension Weight Management/Obesity;Hypertension;Other Weight Management/Obesity;Hypertension    Review  Danny Marquez would like to maintain a weight of 165-170 lbs. He has been weighing himself everyday and states that he has been staying between 160-170 lbs. He also has been checking his BP every day and states that it has been staying within normal ranges. He was encouraged to continue to check both his weight and BP every day. Danny Marquez is content with his weight being at 165-170 lbs. He would like to maintain this weight through healthy eating habits and exercise. He has been checking his BP at home everyday, and states that his BP is staying within normal ranges. 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 Danny Marquez is going to go to pain therapy for his back. He has had to operations on his back in the past. It is hard for him to get around they way he wants to. He wants to ask his doctor of some therapy options for his back pain even if it means surgery. Danny Marquez well in rehab.  He has done well with keeping weight steady and blood pressures good.  He is feeling good overall and can tell the difference the program has made for him.    Expected Outcomes Short: Continue to check BP at home. Long: Continue to work towards weight goal. Short: Continue to check BP at home. Long: Continue to monitor lifestyle risk factors. Short: speek with doctor about back pain. Long: maintain and follow up with doctor to keep back pain at a minimum. Short: Conitnue to work weight Long: Conitnue to [employer]  risk factors.             Core Components/Risk Factors/Patient Goals at Discharge (Final Review):   Goals and Risk Factor Review - 07/30/22 1415       Core Components/Risk Factors/Patient Goals Review   Personal Goals Review Weight Management/Obesity;Hypertension    Review Danny Marquez is doing well in rehab.  He has done well with keeping weight steady and blood pressures good.  He is feeling good overall and can tell the difference the  program has made for him.    Expected Outcomes Short: Conitnue to work weight Long: Conitnue to [employer] risk factors.             ITP Comments:  ITP Comments     Row Name 02/12/22 1600 02/22/22 1554 02/26/22 1404 03/20/22 1134 03/21/22 1035   ITP Comments Virtual orientation call completed today. he has an appointment on Date: 02/22/2022  for EP eval and gym Orientation.  Documentation of diagnosis can be found in [facility] records Date: 02/22/2022 . Completed 6MWT and gym orientation. Initial ITP created and sent for review to Dr. Emily Filbert, Medical Director. First full day of exercise!  Patient was oriented to gym and equipment including functions, settings, policies, and procedures.  Patient's individual exercise prescription and treatment plan were reviewed.  All starting workloads were established based on the results of the 6 minute walk test done at initial orientation visit.  The plan for exercise progression was also introduced and progression will be customized based on patient's performance and goals. Danny Marquez has not been to rehab since 02/26/22 which was his 1st day of exercise. 30 Day review completed. Medical Director ITP review done, changes made as directed, and signed approval by Medical Director.   New out on medical leave    [place] Name 04/04/22 1452 04/18/22 1000 05/16/22 1351 06/07/22 1038 06/11/22 1411   ITP Comments Pt informed of 4.1 lb weight gain since class 2 days ago.. Pt states that he is traying to gain weight; is on diuretic. He is monitoring. 30 Day review completed. Medical Director ITP review done, changes made as directed, and signed approval by Medical Director. 30 Day review completed. Medical Director ITP review done, changes made as directed, and signed approval by Medical Director. 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 Right now he is tentative to come back on 10/11, but will need clearance to return to rehab. Patient  aware. Danny Marquez is returning to rehab today with clearance from his MD. Due to his absence and need to get back into the routine of rehab, goals were unable to be addressed at this time.    [place] Name 06/13/22 0803 07/11/22 1034 08/08/22 1056       ITP Comments 30 Day review completed. Medical Director ITP review done, changes made as directed, and signed approval by Medical Director.    OUt for Medical reason 30 Day review completed. Medical Director ITP review done, changes made as directed, and signed approval by Medical Director. 30 Day review completed. Medical Director ITP review done, changes made as directed, and signed approval by Medical Director.              Comments:

## 2022-08-08 NOTE — Progress Notes (Addendum)
Cardiac Individual Treatment Plan  Patient Details  Name: Danny Marquez MRN: [id number] Date of Birth: May 29, 1946 Referring Provider:   Flowsheet Row Cardiac Rehab from 02/22/2022 in [facility]  Referring Provider Garth Schlatter MD       Initial Encounter Date:  Flowsheet Row Cardiac Rehab from 02/22/2022 in [facility] Cardiac and Pulmonary Rehab  Date 02/22/22       Visit Diagnosis: Heart failure, chronic systolic ([place])  Patient's Home Medications on Admission:  Current Outpatient Medications:    allopurinol (ZYLOPRIM) 100 MG tablet, Take 200 mg by mouth daily., Disp: , Rfl:    amiodarone (PACERONE) 200 MG tablet, Take 100 mg by mouth 2 (two) times daily., Disp: , Rfl:    apixaban (ELIQUIS) 5 MG TABS tablet, Take 5 mg by mouth 2 (two) times daily., Disp: , Rfl:    apixaban (ELIQUIS) 5 MG TABS tablet, Take 5 mg by mouth 2 (two) times daily., Disp: , Rfl:    cefdinir (OMNICEF) 300 MG capsule, Take 1 capsule (300 mg total) by mouth 2 (two) times daily., Disp: 20 capsule, Rfl: 0   DONEPEZIL HCL PO, Take 10 mg by mouth., Disp: , Rfl:    empagliflozin (JARDIANCE) 25 MG TABS tablet, Take 12.5 mg by mouth daily., Disp: , Rfl:    HYDROcodone-acetaminophen (NORCO/VICODIN) 5-325 MG tablet, Take 1 tablet by mouth every 6 (six) hours as needed., Disp: 10 tablet, Rfl: 0   pregabalin (LYRICA) 50 MG capsule, Take 50 mg in the morning, 50 mg in the afternoon, and 100 mg at night, Disp: , Rfl:    ranolazine (RANEXA) 500 MG 12 hr tablet, Take 500 mg by mouth 2 (two) times daily., Disp: , Rfl:    rosuvastatin (CRESTOR) 5 MG tablet, , Disp: , Rfl:    sertraline (ZOLOFT) 100 MG tablet, Take 100 mg by mouth daily. , Disp: , Rfl:    spironolactone (ALDACTONE) 25 MG tablet, Take 25 mg by mouth daily., Disp: , Rfl:    torsemide (DEMADEX) 20 MG tablet, Take 20 mg by mouth daily., Disp: , Rfl:    TRAZODONE HCL PO, Take 75 mg by mouth., Disp: , Rfl:   Past Medical History: Past  Medical History:  Diagnosis Date   A-fib ([place])    CHF (congestive heart failure) (H[facility])    EF 30%   Chronic kidney disease    congenital one kidney   Hypertension    Myocardial infarction ([place])    Solitary kidney     Tobacco Use: Social History   Tobacco Use  Smoking Status Former   Years: 15.00   Types: Cigarettes  Smokeless Tobacco Never    Labs: Review Flowsheet        No data to display           Exercise Target Goals: Exercise Program Goal: Individual exercise prescription set using results from initial 6 min walk test and THRR while considering  patient's activity barriers and safety.   Exercise Prescription Goal: Initial exercise prescription builds to 30-45 minutes a day of aerobic activity, 2-3 days per week.  Home exercise guidelines will be given to patient during program as part of exercise prescription that the participant will acknowledge.   Education: Aerobic Exercise: - Group verbal and visual presentation on the components of exercise prescription. Introduces F.I.T.T principle from ACSM for exercise prescriptions.  Reviews F.I.T.T. principles of aerobic exercise including progression. Written material given at graduation. Flowsheet Row Cardiac Rehab from 04/18/2022 in [facility] Cardiac  and Pul[facility] Rehab  Education need identified 02/22/22       Education: Resistance Exercise: - Group verbal and visual presentation on the components of exercise prescription. Introduces F.I.T.T principle from ACSM for exercise prescriptions  Reviews F.I.T.T. principles of resistance exercise including progression. Written material given at graduation.    Education: Exercise & Equipment Safety: - Individual verbal instruction and demonstration of equipment use and safety with use of the equipment. Flowsheet Row Cardiac Rehab from 04/18/2022 in [facility]  Education need identified 02/22/22  Date 02/22/22  Educator [place]  Instruction Review Code 1-  Verbalizes Understanding       Education: Exercise Physiology & General Exercise Guidelines: - Group verbal and written instruction with models to review the exercise physiology of the cardiovascular system and associated critical values. Provides general exercise guidelines with specific guidelines to those with heart or lung disease.    Education: Flexibility, Balance, Mind/Body Relaxation: - Group verbal and visual presentation with interactive activity on the components of exercise prescription. Introduces F.I.T.T principle from ACSM for exercise prescriptions. Reviews F.I.T.T. principles of flexibility and balance exercise training including progression. Also discusses the mind body connection.  Reviews various relaxation techniques to help reduce and manage stress (i.e. Deep breathing, progressive muscle relaxation, and visualization). Balance handout provided to take home. Written material given at graduation.   Activity Barriers & Risk Stratification:  Activity Barriers & Cardiac Risk Stratification - 02/22/22 1555       Activity Barriers & Cardiac Risk Stratification   Activity Barriers Balance Concerns;Other (comment);Back Problems;Deconditioning;Right Knee Replacement;Left Knee Replacement;Muscular Weakness;History of Falls;Joint Problems    Comments neuropathy, multiple back surgeries, dizziness    Cardiac Risk Stratification High             6 Minute Walk:  6 Minute Walk     Row Name 02/22/22 1609 07/30/22 1446       6 Minute Walk   Phase Initial Discharge    Distance 1020 feet 1110 feet    Distance % Change -- 8.8 %    Distance Feet Change -- 90 ft    Walk Time 6 minutes 6 minutes    # of Rest Breaks 0 0    MPH 1.93 2.1    METS 2.07 2.25    RPE 10 12    Perceived Dyspnea  0 0    VO2 Peak 7.27 7.88    Symptoms Yes (comment) No    Comments Right knee pain 3/10 --    Resting HR 62 bpm 81 bpm    Resting BP 98/56 124/64    Resting Oxygen Saturation  98 % 90  %    Exercise Oxygen Saturation  during 6 min walk 96 % --    Max Ex. HR 105 bpm 108 bpm    Max Ex. BP 122/58 128/64    2 Minute Post BP 108/60 --             Oxygen Initial Assessment:   Oxygen Re-Evaluation:   Oxygen Discharge (Final Oxygen Re-Evaluation):   Initial Exercise Prescription:  Initial Exercise Prescription - 02/22/22 1600       Date of Initial Exercise RX and Referring Provider   Date 02/22/22    Referring Provider Garth Schlatter MD      Oxygen   Maintain Oxygen Saturation 88% or higher      Recumbant Bike   Level 1    RPM 60    Watts 10  Minutes 15    METs 2      NuStep   Level 1    SPM 80    Minutes 15    METs 2      Biostep-RELP   Level 1    SPM 50    Minutes 15    METs 2      Track   Laps 14    Minutes 15    METs 1.7      Prescription Details   Frequency (times per week) 3    Duration Progress to 30 minutes of continuous aerobic without signs/symptoms of physical distress      Intensity   THRR 40-80% of Max Heartrate 94- 127    Ratings of Perceived Exertion 11-13    Perceived Dyspnea 0-4      Progression   Progression Continue to progress workloads to maintain intensity without signs/symptoms of physical distress.      Resistance Training   Training Prescription Yes    Weight 4 lb    Reps 10-15             Perform Capillary Blood Glucose checks as needed.  Exercise Prescription Changes:   Exercise Prescription Changes     Row Name 02/22/22 1600 04/11/22 1200 04/24/22 1500 05/07/22 1500 07/03/22 1500     Response to Exercise   Blood Pressure (Admit) 98/56 122/62 100/60 102/56 116/62   Blood Pressure (Exercise) 122/58 128/66 110/62 122/60 125/72   Blood Pressure (Exit) 108/60 118/60 102/62 108/58 114/64   Heart Rate (Admit) 62 bpm 78 bpm 84 bpm 97 bpm 94 bpm   Heart Rate (Exercise) 105 bpm 116 bpm 100 bpm 94 bpm 111 bpm   Heart Rate (Exit) 64 bpm 69 bpm 88 bpm 93 bpm 91 bpm   Oxygen Saturation (Admit) 98  % -- -- -- --   Oxygen Saturation (Exercise) 96 % -- -- -- --   Oxygen Saturation (Exit) 96 % -- -- -- --   Rating of Perceived Exertion (Exercise) _0 Perceived Dyspnea (Exercise) 0 -- -- -- --   Symptoms Right knee pain 3/10 none none none none   Comments walk test results 1st full day of exercise run of vtach- asymptomatic none return back post ventricular abalation   Duration -- Progress to 30 minutes of  aerobic without signs/symptoms of physical distress Continue with 30 min of aerobic exercise without signs/symptoms of physical distress. Continue with 30 min of aerobic exercise without signs/symptoms of physical distress. Continue with 30 min of aerobic exercise without signs/symptoms of physical distress.   Intensity -- THRR unchanged THRR unchanged THRR unchanged THRR unchanged     Progression   Progression -- Continue to progress workloads to maintain intensity without signs/symptoms of physical distress. Continue to progress workloads to maintain intensity without signs/symptoms of physical distress. Continue to progress workloads to maintain intensity without signs/symptoms of physical distress. Continue to progress workloads to maintain intensity without signs/symptoms of physical distress.   Average METs -- 1.82 1.92 2.81 2.43     Resistance Training   Training Prescription -- Yes Yes Yes Yes   Weight -- 4 lb 4 lb 4 lb 4 lb   Reps -- 10-15 10-15 10-15 10-15     Interval Training   Interval Training -- -- No No No     Recumbant Bike   Level -- -- _1 Watts -- -- -- 16 22   Minutes -- --  _0 METs -- -- 2.42 2.63 2.92     NuStep   Level -- 3 -- -- --   Minutes -- 15 -- -- --   METs -- 2.5 -- -- --     T5 Nustep   Level -- -- 1 -- --   Minutes -- -- 15 -- --     Biostep-RELP   Level -- _1 Minutes -- _2 METs -- _3 Track   Laps -- -- 14 -- 15   Minutes -- -- 15 -- 15   METs -- -- 1.76 -- 1.82     Home Exercise  Plan   Plans to continue exercise at -- -- -- Home (comment)  walking Home (comment)  walking   Frequency -- -- -- Add 2 additional days to program exercise sessions. Add 2 additional days to program exercise sessions.   Initial Home Exercises Provided -- -- -- 05/07/22 05/07/22     Oxygen   Maintain Oxygen Saturation -- 88% or higher 88% or higher 88% or higher 88% or higher    Row Name 07/17/22 1500 07/31/22 1400           Response to Exercise   Blood Pressure (Admit) 132/70 122/72      Blood Pressure (Exit) 96/56 96/52      Heart Rate (Admit) 82 bpm 71 bpm      Heart Rate (Exercise) 100 bpm 82 bpm      Heart Rate (Exit) 86 bpm 76 bpm      Rating of Perceived Exertion (Exercise) 15 14      Symptoms none none      Comments -- back & hip pain      Duration Continue with 30 min of aerobic exercise without signs/symptoms of physical distress. Continue with 30 min of aerobic exercise without signs/symptoms of physical distress.      Intensity THRR unchanged THRR unchanged        Progression   Progression Continue to progress workloads to maintain intensity without signs/symptoms of physical distress. Continue to progress workloads to maintain intensity without signs/symptoms of physical distress.      Average METs 2.16 2.14        Resistance Training   Training Prescription Yes Yes      Weight 4 lb 5 lb      Reps 10-15 10-15        Interval Training   Interval Training No No        Recumbant Bike   Level 2 5      Watts 13 20      Minutes 15 15      METs 2.83 2.2        NuStep   Level 1 --      Minutes 15 --      METs 2 --        Biostep-RELP   Level 3 2      Minutes 15 15      METs 2 2        Track   Laps 15 20  = 16 track      Minutes 15 15      METs 1.82 1.87        Home Exercise Plan   Plans to continue exercise at Home (comment)  walking Home (comment)  walking      Frequency  Add 2 additional days to program exercise sessions. Add 2 additional days to  program exercise sessions.      Initial Home Exercises Provided 05/07/22 05/07/22        Oxygen   Maintain Oxygen Saturation 88% or higher 88% or higher               Exercise Comments:   Exercise Comments     Row Name 02/26/22 1404 08/08/22 1417         Exercise Comments First full day of exercise!  Patient was oriented to gym and equipment including functions, settings, policies, and procedures.  Patient's individual exercise prescription and treatment plan were reviewed.  All starting workloads were established based on the results of the 6 minute walk test done at initial orientation visit.  The plan for exercise progression was also introduced and progression will be customized based on patient's performance and goals. Seth graduated today from  rehab with 35 sessions completed.  Details of the patient's exercise prescription and what He needs to do in order to continue the prescription and progress were discussed with patient.  Patient was given a copy of prescription and goals.  Patient verbalized understanding.  Attilio plans to continue to exercise by walking.               Exercise Goals and Review:   Exercise Goals     Row Name 02/22/22 1634             Exercise Goals   Increase Physical Activity Yes       Intervention Provide advice, education, support and counseling about physical activity/exercise needs.;Develop an individualized exercise prescription for aerobic and resistive training based on initial evaluation findings, risk stratification, comorbidities and participant's personal goals.       Expected Outcomes Short Term: Attend rehab on a regular basis to increase amount of physical activity.;Long Term: Add in home exercise to make exercise part of routine and to increase amount of physical activity.;Long Term: Exercising regularly at least 3-5 days a week.       Increase Strength and Stamina Yes       Intervention Provide advice, education, support  and counseling about physical activity/exercise needs.;Develop an individualized exercise prescription for aerobic and resistive training based on initial evaluation findings, risk stratification, comorbidities and participant's personal goals.       Expected Outcomes Short Term: Increase workloads from initial exercise prescription for resistance, speed, and METs.;Short Term: Perform resistance training exercises routinely during rehab and add in resistance training at home;Long Term: Improve cardiorespiratory fitness, muscular endurance and strength as measured by increased METs and functional capacity (6MWT)       Able to understand and use rate of perceived exertion (RPE) scale Yes       Intervention Provide education and explanation on how to use RPE scale       Expected Outcomes Short Term: Able to use RPE daily in rehab to express subjective intensity level;Long Term:  Able to use RPE to guide intensity level when exercising independently       Able to understand and use Dyspnea scale Yes       Intervention Provide education and explanation on how to use Dyspnea scale       Expected Outcomes Short Term: Able to use Dyspnea scale daily in rehab to express subjective sense of shortness of breath during exertion;Long Term: Able to use Dyspnea scale to guide intensity level when exercising independently  Knowledge and understanding of Target Heart Rate Range (THRR) Yes       Intervention Provide education and explanation of THRR including how the numbers were predicted and where they are located for reference       Expected Outcomes Short Term: Able to state/look up THRR;Long Term: Able to use THRR to govern intensity when exercising independently;Short Term: Able to use daily as guideline for intensity in rehab       Able to check pulse independently Yes       Intervention Provide education and demonstration on how to check pulse in carotid and radial arteries.;Review the importance of being  able to check your own pulse for safety during independent exercise       Expected Outcomes Short Term: Able to explain why pulse checking is important during independent exercise;Long Term: Able to check pulse independently and accurately       Understanding of Exercise Prescription Yes       Intervention Provide education, explanation, and written materials on patient's individual exercise prescription       Expected Outcomes Short Term: Able to explain program exercise prescription;Long Term: Able to explain home exercise prescription to exercise independently                Exercise Goals Re-Evaluation :  Exercise Goals Re-Evaluation     Row Name 02/26/22 1404 04/09/22 1430 04/11/22 1204 04/24/22 1509 05/07/22 1430     Exercise Goal Re-Evaluation   Exercise Goals Review Increase Physical Activity;Able to understand and use rate of perceived exertion (RPE) scale;Understanding of Exercise Prescription;Knowledge and understanding of Target Heart Rate Range (THRR);Increase Strength and Stamina;Able to understand and use Dyspnea scale;Able to check pulse independently Increase Physical Activity;Understanding of Exercise Prescription;Increase Strength and Stamina Increase Physical Activity;Understanding of Exercise Prescription;Increase Strength and Stamina Increase Physical Activity;Understanding of Exercise Prescription;Increase Strength and Stamina Increase Physical Activity;Understanding of Exercise Prescription;Increase Strength and Stamina   Comments Reviewed RPE and dyspnea scales, THR and program prescription with pt today.  Pt voiced understanding and was given a copy of goals to take home. Erik is really enjoying the program. He wants to return to a more active lifestyle and increase his strength and stamina. He states that he is currently doing some bodyweight exercises at home given to hime by [employer]. He wants to return to being able to do the yard work he likes to do. He states  that he is walking at least a mile everyday as well for endurance training. Joanthan is off to a good start in rehab. He had an average overall MET level of 1.82 METs. He also got up to level 3 on the T4. He tolerated using 4 lb hand weights for resistance training as well. We will continue to monitor his progress in the program. Atul has been coming to rehab pretty consistently. He did have several runs of non-sustained v tach episodes dated on 8/2 where he was completely asymptomatic. He saw his EP doctor and they did an interrogation and lowered his rate. Other than that, he has been walking the track averaging around 14 laps. Biostep level has increased to 3. We will slowly progress based on how his rhythm looks and how he feels. Per his wife, he is supposed to be getting a ventricular ablation in September, where we will need to place him on medical hold with return with clearance. We will continue to monitor. Reviewed home exercise with pt today.  Pt plans to walk around his  neighborhood for exercise. He will also use his personal hand weights for resistance training at home. Emphasized exercising with supervision. Reviewed THR, pulse, RPE, sign and symptoms, pulse oximetery and when to call 911 or MD.  Also discussed weather considerations and indoor options.  Pt voiced understanding.   Expected Outcomes Short: Use RPE daily to regulate intensity. Long: Follow program prescription in THR. Short: Continue to walk at home. Long: Continue to improve strength and stamina. Short: Continue to attend rehab and follow exercise prescription. Long: Continue to improve overall MET levels. Short: Watch rhythm and report any symptoms, continue walking on the track Long: Continue to increase overall strength and stamina Short: Begin walking at home on days away from rehab. Long: Continue to increase overall strength and stamina.    [place] Name 06/07/22 0813 07/03/22 1514 07/17/22 1510 07/30/22 1441 07/31/22 1414      Exercise Goal Re-Evaluation   Exercise Goals Review Increase Physical Activity;Understanding of Exercise Prescription;Increase Strength and Stamina Increase Physical Activity;Understanding of Exercise Prescription;Increase Strength and Stamina Increase Physical Activity;Understanding of Exercise Prescription;Increase Strength and Stamina Increase Physical Activity;Understanding of Exercise Prescription;Increase Strength and Stamina Increase Physical Activity;Understanding of Exercise Prescription;Increase Strength and Stamina   Comments Patient has not been here since last review as he had a VT ablation complete. Patient should be cleared and will return to rehab in a couple of weeks. Cylus has returned since his ablation and doing well in rehab. We eased him back into exercise and he is slowly starting to build it back up. He did try out level 3 on the recumbent bike and worked up to 22 watts which is the highest he has had yet! He was also able to walk a full 15 laps on the track. We will continue to monitor. Tashan is doing well in rehab. He is still easing back into exercise and he is slowly starting to build it back up. He did get back up to level 3 on the biostep. He has also continued to walk a full 15 laps on the track. We will continue to monitor his progress in the program. Roddrick is doing well in rehab. He is doing his PT exercises and walking at home 2x a day to build up strenght. He does not how much better he is feeling overall since starting rehab.  He improved his post walk by 90 ft!! Nymir continues to do well in rehab.  He's got a couple weeks left until he graduates. He increased to level 5 on the recumbent bike and improved on his post 6MWT! He also is now using 5 lb for handweights. We will continue to monitor until he graduates.   Expected Outcomes Short:Clear and return to rehab Long: Complete [facility] Program Short: Continue to work up laps on track Long: Continue to build up  strength and stamina Short: Continue to work up laps on track Long: Continue to increase strength and stamina Short:Graduate Long: continue to exercise independently Short: Graduate Long: Continue to build up overall strength and stamina            Discharge Exercise Prescription (Final Exercise Prescription Changes):  Exercise Prescription Changes - 07/31/22 1400       Response to Exercise   Blood Pressure (Admit) 122/72    Blood Pressure (Exit) 96/52    Heart Rate (Admit) 71 bpm    Heart Rate (Exercise) 82 bpm    Heart Rate (Exit) 76 bpm    Rating of Perceived Exertion (Exercise) 14  Symptoms none    Comments back & hip pain    Duration Continue with 30 min of aerobic exercise without signs/symptoms of physical distress.    Intensity THRR unchanged      Progression   Progression Continue to progress workloads to maintain intensity without signs/symptoms of physical distress.    Average METs 2.14      Resistance Training   Training Prescription Yes    Weight 5 lb    Reps 10-15      Interval Training   Interval Training No      Recumbant Bike   Level 5    Watts 20    Minutes 15    METs 2.2      Biostep-RELP   Level 2    Minutes 15    METs 2      Track   Laps 20   = 16 track   Minutes 15    METs 1.87      Home Exercise Plan   Plans to continue exercise at Home (comment)   walking   Frequency Add 2 additional days to program exercise sessions.    Initial Home Exercises Provided 05/07/22      Oxygen   Maintain Oxygen Saturation 88% or higher             Nutrition:  Target Goals: Understanding of nutrition guidelines, daily intake of sodium <1525m, cholesterol <204m calories 30% from fat and 7% or less from saturated fats, daily to have 5 or more servings of fruits and vegetables.  Education: All About Nutrition: -Group instruction provided by verbal, written material, interactive activities, discussions, models, and posters to present general  guidelines for heart healthy nutrition including fat, fiber, MyPlate, the role of sodium in heart healthy nutrition, utilization of the nutrition label, and utilization of this knowledge for meal planning. Follow up email sent as well. Written material given at graduation. [facility]  Education need identified 02/22/22       Biometrics:  Pre Biometrics - 02/22/22 1554       Pre Biometrics   Height 5' 5.75" (1.67 m)    Weight 164 lb 11.2 oz (74.7 kg)    BMI (Calculated) 26.79    Single Leg Stand 0 seconds             Post Biometrics - 07/30/22 1446        Post  Biometrics   Height 5' 5.75" (1.67 m)    Weight 170 lb 14.4 oz (77.5 kg)    BMI (Calculated) 27.8    Single Leg Stand 0 seconds             Nutrition Therapy Plan and Nutrition Goals:  Nutrition Therapy & Goals - 04/23/22 1507       Nutrition Therapy   RD appointment deferred Yes   Pt would like to defer speaking about nutrition, but may want to in the future. Will continue to follow up.     Personal Nutrition Goals   Nutrition Goal Pt would like to defer speaking about nutrition, but may want to in the future. Will continue to follow up.             Nutrition Assessments:  MEDIFICTS Score Key: ?70 Need to make dietary changes  40-70 Heart Healthy Diet ? 40 Therapeutic Level Cholesterol Diet  [facility]  Picture Your  Plate Total Score on Discharge 52      Picture Your Plate Scores: <85 Unhealthy dietary pattern with much room for improvement. 41-50 Dietary pattern unlikely to meet recommendations for good health and room for improvement. 51-60 More healthful dietary pattern, with some room for improvement.  >60 Healthy dietary pattern, although there may be some specific behaviors that could be improved.    Nutrition Goals Re-Evaluation:  Nutrition Goals  Re-Evaluation     Lexa Name 04/09/22 1443 07/09/22 1409 07/30/22 1438         Goals   Nutrition Goal -- -- Continues to defer appt     Comment Hermann does not want to meet with the RD at this time, but would be open to speaking with her in the future. Nyair does not want to meet with the dietitian at this time. Laronn continues to defer appt.  He is doing well with his diet as his wife stays on top of his diet.  His weakness is candy and enjoys it.     Expected Outcome Short: Plan to meet with RD Long: Schedule meeting with RD -- Continue to follow heart healthy diet              Nutrition Goals Discharge (Final Nutrition Goals Re-Evaluation):  Nutrition Goals Re-Evaluation - 07/30/22 1438       Goals   Nutrition Goal Continues to defer appt    Comment Captain continues to defer appt.  He is doing well with his diet as his wife stays on top of his diet.  His weakness is candy and enjoys it.    Expected Outcome Continue to follow heart healthy diet             Psychosocial: Target Goals: Acknowledge presence or absence of significant depression and/or stress, maximize coping skills, provide positive support system. Participant is able to verbalize types and ability to use techniques and skills needed for reducing stress and depression.   Education: Stress, Anxiety, and Depression - Group verbal and visual presentation to define topics covered.  Reviews how body is impacted by stress, anxiety, and depression.  Also discusses healthy ways to reduce stress and to treat/manage anxiety and depression.  Written material given at graduation.   Education: Sleep Hygiene -Provides group verbal and written instruction about how sleep can affect your health.  Define sleep hygiene, discuss sleep cycles and impact of sleep habits. Review good sleep hygiene tips.    Initial Review & Psychosocial Screening:  Initial Psych Review & Screening - 02/12/22 1533       Initial Review    Current issues with None Identified      Family Dynamics   Good Support System? Yes   wife,     Barriers   Psychosocial barriers to participate in program There are no identifiable barriers or psychosocial needs.      Screening Interventions   Interventions Encouraged to exercise    Expected Outcomes Short Term goal: Utilizing psychosocial counselor, staff and physician to assist with identification of specific Stressors or current issues interfering with healing process. Setting desired goal for each stressor or current issue identified.;Long Term Goal: Stressors or current issues are controlled or eliminated.;Short Term goal: Identification and review with participant of any Quality of Life or Depression concerns found by scoring the questionnaire.;Long Term goal: The participant improves quality of Life and PHQ9 Scores as seen by post scores and/or verbalization of changes  Quality of Life Scores:   Quality of Life - 02/22/22 1556       Quality of Life   Select Quality of Life      Quality of Life Scores   Health/Function Pre 8.3 %    Socioeconomic Pre 25.19 %    Psych/Spiritual Pre 22.71 %    Family Pre 30 %    GLOBAL Pre 18.14 %            Scores of 19 and below usually indicate a poorer quality of life in these areas.  A difference of  2-3 points is a clinically meaningful difference.  A difference of 2-3 points in the total score of the Quality of Life Index has been associated with significant improvement in overall quality of life, self-image, physical symptoms, and general health in studies assessing change in quality of life.  PHQ-9: Review Flowsheet       08/08/2022 04/09/2022 02/22/2022  Depression screen PHQ 2/9  Decreased Interest 0 0 0  Down, Depressed, Hopeless 1 0 1  PHQ - 2 Score 1 0 1  Altered sleeping 0 0 2  Tired, decreased energy _0 Change in appetite 0 0 0  Feeling bad or failure about yourself  0 0 0  Trouble concentrating 0 0  0  Moving slowly or fidgety/restless 0 0 1  Suicidal thoughts 0 0 0  PHQ-9 Score _1 Difficult doing work/chores Not difficult at all Not difficult at all Not difficult at all   Interpretation of Total Score  Total Score Depression Severity:  1-4 = Minimal depression, 5-9 = Mild depression, 10-14 = Moderate depression, 15-19 = Moderately severe depression, 20-27 = Severe depression   Psychosocial Evaluation and Intervention:  Psychosocial Evaluation - 02/12/22 1554       Psychosocial Evaluation & Interventions   Comments Sai has no barriers to attending the program. He lives with his wife and she is his support. He wants to get healthy again and return to his ability to [place] yard s and weed the garden. 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 The threshold has been decreased to about 150 beats as of last month when he had several episodes of VTach. 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 He has had several falls, from the neuropathy and from dizzy spells.  He understands that we will not have him on the treadmill to start. He is ready to get started    Expected Outcomes STG Sayge attends all scheduled sessions, he is able to progress with his exercise to return to his lawn chores without too much effort. LTG Vuk continues to progress with his day to day activity.    Continue Psychosocial Services  Follow up required by staff             Psychosocial Re-Evaluation:  Psychosocial Re-Evaluation     [place] Name 04/09/22 1437 05/07/22 1409 07/09/22 1420 07/30/22 1439       Psychosocial Re-Evaluation   Current issues with Current Stress Concerns;Current Sleep Concerns None Identified None Identified;Current Stress Concerns Current Stress Concerns    Comments Itay states that he has been frustrated because he feels he is not as healthy as used to be. He is motivated to work hard to get back to feeling healthy and fit. He also states that  exercise is a good stress reliever for him. He states that his wife, son, and  friends are a good support system for him and keep him motivated. He states that he is having some issues with his sleep due to some medication changes. Xayden denies any current stressors at this time. He states that exercise has been a good stress reliever for him, but he does get frustrated when he is not performing as well as he would like during exercise. He states that he has a great support system around him made up mainly by his wife, but also his children and grandchildren. He also reports that after speaking with his doctor and changing some of his medications he is sleeping much better. Lonny states that he does not have any mental issues at the moment. He is in a lot of back pain daily which causes him to have some stress. He does not take anything for anxiety or depression. Diem is doing well in rehab.  He is feeling good mentally and stays positive.  His health is his biggest stressor but he tries not to let it slow him down too much.  He continues to sleep well.    Expected Outcomes Short: Continue to attend rehab for stress relief. Long: Maintain positive outlook. Short: Continue to attend rehab for stress relief. Long: Maintain positive outlook. Short: Attend [facility] stress management education to decrease stress. Long: Maintain exercise Post [facility] to keep stress at a minimum. Continue to exercise for mental boost and stay positive    Interventions -- Encouraged to attend Cardiac Rehabilitation for the exercise Encouraged to attend Cardiac Rehabilitation for the exercise Encouraged to attend Cardiac Rehabilitation for the exercise    Continue Psychosocial Services  Follow up required by staff Follow up required by staff Follow up required by staff --      Initial Review   Source of Stress Concerns -- None Identified -- --             Psychosocial Discharge (Final Psychosocial Re-Evaluation):   Psychosocial Re-Evaluation - 07/30/22 1439       Psychosocial Re-Evaluation   Current issues with Current Stress Concerns    Comments English is doing well in rehab.  He is feeling good mentally and stays positive.  His health is his biggest stressor but he tries not to let it slow him down too much.  He continues to sleep well.    Expected Outcomes Continue to exercise for mental boost and stay positive    Interventions Encouraged to attend Cardiac Rehabilitation for the exercise             Vocational Rehabilitation: Provide vocational rehab assistance to qualifying candidates.   Vocational Rehab Evaluation & Intervention:   Education: Education Goals: Education classes will be provided on a variety of topics geared toward better understanding of heart health and risk factor modification. Participant will state understanding/return demonstration of topics presented as noted by education test scores.  Learning Barriers/Preferences:   General Cardiac Education Topics:  AED/CPR: - Group verbal and written instruction with the use of models to demonstrate the basic use of the AED with the basic ABC's of resuscitation.   Anatomy and Cardiac Procedures: - Group verbal and visual presentation and models provide information about basic cardiac anatomy and function. Reviews the testing methods done to diagnose heart disease and the outcomes of the test results. Describes the treatment choices: Medical Management, Angioplasty, or Coronary Bypass Surgery for treating various heart conditions including Myocardial Infarction, Angina, Valve Disease, and Cardiac Arrhythmias.  Written material given at graduation. Flowsheet Row  Cardiac Rehab from 04/18/2022 in [facility] Cardiac and Pulmonary Rehab  Education need identified 02/22/22       Medication Safety: - Group verbal and visual instruction to review commonly prescribed medications for heart and lung disease. Reviews the medication, class of  the drug, and side effects. Includes the steps to properly store meds and maintain the prescription regimen.  Written material given at graduation.   Intimacy: - Group verbal instruction through game format to discuss how heart and lung disease can affect sexual intimacy. Written material given at graduation..   Know Your Numbers and Heart Failure: - Group verbal and visual instruction to discuss disease risk factors for cardiac and pulmonary disease and treatment options.  Reviews associated critical values for Overweight/Obesity, Hypertension, Cholesterol, and Diabetes.  Discusses basics of heart failure: signs/symptoms and treatments.  Introduces Heart Failure Zone chart for action plan for heart failure.  Written material given at graduation. Flowsheet Row Cardiac Rehab from 04/18/2022 in [facility] Cardiac and Pulmonary Rehab  Education need identified 02/22/22  Date 04/18/22  Educator SB  Instruction Review Code 1- Verbalizes Understanding       Infection Prevention: - Provides verbal and written material to individual with discussion of infection control including proper hand washing and proper equipment cleaning during exercise session. Flowsheet Row Cardiac Rehab from 04/18/2022 in [facility] Cardiac and Pulmonary Rehab  Education need identified 02/22/22  Date 02/22/22  Educator [place]  Instruction Review Code 1- Verbalizes Understanding       Falls Prevention: - Provides verbal and written material to individual with discussion of falls prevention and safety. Flowsheet Row Cardiac Rehab from 02/12/2022 in [facility] Cardiac and Pulmonary Rehab  Date 02/12/22  Educator SB  Instruction Review Code 1- Verbalizes Understanding       Other: -Provides group and verbal instruction on various topics (see comments)   Knowledge Questionnaire Score:  Knowledge Questionnaire Score - 08/08/22 1610       Knowledge Questionnaire Score   Post Score 21/26             Core Components/Risk  Factors/Patient Goals at Admission:  Personal Goals and Risk Factors at Admission - 02/22/22 1635       Core Components/Risk Factors/Patient Goals on Admission    Weight Management Yes;Weight Maintenance    Intervention Weight Management: Develop a combined nutrition and exercise program designed to reach desired caloric intake, while maintaining appropriate intake of nutrient and fiber, sodium and fats, and appropriate energy expenditure required for the weight goal.;Weight Management: Provide education and appropriate resources to help participant work on and attain dietary goals.;Weight Management/Obesity: Establish reasonable short term and long term weight goals.    Admit Weight 164 lb (74.4 kg)    Goal Weight: Short Term 164 lb (74.4 kg)    Goal Weight: Long Term 164 lb (74.4 kg)    Expected Outcomes Short Term: Continue to assess and modify interventions until short term weight is achieved;Long Term: Adherence to nutrition and physical activity/exercise program aimed toward attainment of established weight goal;Weight Maintenance: Understanding of the daily nutrition guidelines, which includes 25-35% calories from fat, 7% or less cal from saturated fats, less than 279m cholesterol, less than 1.5gm of sodium, & 5 or more servings of fruits and vegetables daily;Understanding recommendations for meals to include 15-35% energy as protein, 25-35% energy from fat, 35-60% energy from carbohydrates, less than 2027mof dietary cholesterol, 20-35 gm of total fiber daily;Understanding of distribution of calorie intake throughout the day with the consumption of  4-5 meals/snacks    Heart Failure Yes    Intervention Provide a combined exercise and nutrition program that is supplemented with education, support and counseling about heart failure. Directed toward relieving symptoms such as shortness of breath, decreased exercise tolerance, and extremity edema.    Expected Outcomes Improve functional capacity of  life;Short term: Attendance in program 2-3 days a week with increased exercise capacity. Reported lower sodium intake. Reported increased fruit and vegetable intake. Reports medication compliance.;Short term: Daily weights obtained and reported for increase. Utilizing diuretic protocols set by physician.;Long term: Adoption of self-care skills and reduction of barriers for early signs and symptoms recognition and intervention leading to self-care maintenance.    Hypertension Yes    Intervention Provide education on lifestyle modifcations including regular physical activity/exercise, weight management, moderate sodium restriction and increased consumption of fresh fruit, vegetables, and low fat dairy, alcohol moderation, and smoking cessation.;Monitor prescription use compliance.    Expected Outcomes Short Term: Continued assessment and intervention until BP is < 140/85m HG in hypertensive participants. < 130/82mHG in hypertensive participants with diabetes, heart failure or chronic kidney disease.;Long Term: Maintenance of blood pressure at goal levels.    Lipids Yes    Intervention Provide education and support for participant on nutrition & aerobic/resistive exercise along with prescribed medications to achieve LDL <7039mHDL >34m107m  Expected Outcomes Short Term: Participant states understanding of desired cholesterol values and is compliant with medications prescribed. Participant is following exercise prescription and nutrition guidelines.;Long Term: Cholesterol controlled with medications as prescribed, with individualized exercise RX and with personalized nutrition plan. Value goals: LDL < 70mg84mL > 40 mg.             Education:Diabetes - Individual verbal and written instruction to review signs/symptoms of diabetes, desired ranges of glucose level fasting, after meals and with exercise. Acknowledge that pre and post exercise glucose checks will be done for 3 sessions at entry of  program.   Core Components/Risk Factors/Patient Goals Review:   Goals and Risk Factor Review     Row Name 04/09/22 1445 05/07/22 1413 07/09/22 1414 07/30/22 1415       Core Components/Risk Factors/Patient Goals Review   Personal Goals Review Weight Management/Obesity;Hypertension Weight Management/Obesity;Hypertension Weight Management/Obesity;Hypertension;Other Weight Management/Obesity;Hypertension    Review Elia would like to maintain a weight of 165-170 lbs. He has been weighing himself everyday and states that he has been staying between 160-170 lbs. He also has been checking his BP every day and states that it has been staying within normal ranges. He was encouraged to continue to check both his weight and BP every day. Ronold is content with his weight being at 165-170 lbs. He would like to maintain this weight through healthy eating habits and exercise. He has been checking his BP at home everyday, and states that his BP is staying within normal ranges. 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 Kemet is going to go to pain therapy for his back. He has had to operations on his back in the past. It is hard for him to get around they way he wants to. He wants to ask his doctor of some therapy options for his back pain even if it means surgery. RichaEdmundoing well in rehab.  He has done well with keeping weight steady and blood pressures good.  He is feeling good overall and can tell the difference  the program has made for him.    Expected Outcomes Short: Continue to check BP at home. Long: Continue to work towards weight goal. Short: Continue to check BP at home. Long: Continue to monitor lifestyle risk factors. Short: speek with doctor about back pain. Long: maintain and follow up with doctor to keep back pain at a minimum. Short: Conitnue to work weight Long: Conitnue to montior risk factors.              Core Components/Risk Factors/Patient Goals at Discharge (Final Review):   Goals and Risk Factor Review - 07/30/22 1415       Core Components/Risk Factors/Patient Goals Review   Personal Goals Review Weight Management/Obesity;Hypertension    Review Fallon is doing well in rehab.  He has done well with keeping weight steady and blood pressures good.  He is feeling good overall and can tell the difference the program has made for him.    Expected Outcomes Short: Conitnue to work weight Long: Conitnue to montior risk factors.             ITP Comments:  ITP Comments     Row Name 02/12/22 1600 02/22/22 1554 02/26/22 1404 03/20/22 1134 03/21/22 1035   ITP Comments Virtual orientation call completed today. he has an appointment on Date: 02/22/2022  for EP eval and gym Orientation.  Documentation of diagnosis can be found in [facility] records Date: 02/22/2022 . Completed 6MWT and gym orientation. Initial ITP created and sent for review to Dr. Emily Filbert, Medical Director. First full day of exercise!  Patient was oriented to gym and equipment including functions, settings, policies, and procedures.  Patient's individual exercise prescription and treatment plan were reviewed.  All starting workloads were established based on the results of the 6 minute walk test done at initial orientation visit.  The plan for exercise progression was also introduced and progression will be customized based on patient's performance and goals. Spurgeon has not been to rehab since 02/26/22 which was his 1st day of exercise. 30 Day review completed. Medical Director ITP review done, changes made as directed, and signed approval by Medical Director.   New out on medical leave    [place] Name 04/04/22 1452 04/18/22 1000 05/16/22 1351 06/07/22 1038 06/11/22 1411   ITP Comments Pt informed of 4.1 lb weight gain since class 2 days ago.. Pt states that he is traying to gain weight; is on diuretic. He is monitoring. 30 Day  review completed. Medical Director ITP review done, changes made as directed, and signed approval by Medical Director. 30 Day review completed. Medical Director ITP review done, changes made as directed, and signed approval by Medical Director. 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 Right now he is tentative to come back on 10/11, but will need clearance to return to rehab. Patient aware. Ladamien is returning to rehab today with clearance from his MD. Due to his absence and need to get back into the routine of rehab, goals were unable to be addressed at this time.    [place] Name 06/13/22 0803 07/11/22 1034 08/08/22 1056 08/08/22 1417     ITP Comments 30 Day review completed. Medical Director ITP review done, changes made as directed, and signed approval by Medical Director.    OUt for Medical reason 30 Day review completed. Medical Director ITP review done, changes made as directed, and signed approval by Medical Director. 30 Day review completed. Medical Director ITP  review done, changes made as directed, and signed approval by Medical Director. Taimur graduated today from  rehab with 35 sessions completed.  Details of the patient's exercise prescription and what He needs to do in order to continue the prescription and progress were discussed with patient.  Patient was given a copy of prescription and goals.  Patient verbalized understanding.  Jeffrey plans to continue to exercise by walking.             Comments: Discharge ITP

## 2022-08-08 NOTE — Progress Notes (Signed)
Daily Session Note  Patient Details  Name: Danny Marquez MRN: [id number] Date of Birth: Jan 18, 1946 Referring Provider:   Flowsheet Row Cardiac Rehab from 02/22/2022 in [facility] Cardiac and Pulmonary Rehab  Referring Provider Garth Schlatter MD       Encounter Date: 08/08/2022  Check In:  Session Check In - 08/08/22 1413       Check-In   Supervising physician immediately available to respond to emergencies See telemetry face sheet for immediately available ER MD    Location [facility]-Cardiac & Pulmonary Rehab    Staff Present Renita Papa, RN Moises Blood, BS, ACSM CEP, Exercise Physiologist;Megan Tamala Julian, RN, ADN    Virtual Visit No    Medication changes reported     No    Fall or balance concerns reported    No    Warm-up and Cool-down Performed on first and last piece of equipment    Resistance Training Performed Yes    VAD Patient? No    PAD/SET Patient? No      Pain Assessment   Currently in Pain? No/denies                Social History   Tobacco Use  Smoking Status Former   Years: 15.00   Types: Cigarettes  Smokeless Tobacco Never    Goals Met:  Independence with exercise equipment Exercise tolerated well No report of concerns or symptoms today Strength training completed today  Goals Unmet:  Not Applicable  Comments:  Miken graduated today from  rehab with 35 sessions completed.  Details of the patient's exercise prescription and what He needs to do in order to continue the prescription and progress were discussed with patient.  Patient was given a copy of prescription and goals.  Patient verbalized understanding.  [place] plans to continue to exercise by walking.     Dr. Emily Filbert is Medical Director for [place].  Dr. Ottie Glazier is Medical Director for [facility] Pulmonary Rehabilitation.

## 2022-08-08 NOTE — Progress Notes (Signed)
Discharge Summary  [name] (DOB: [birth year])  Danny Marquez graduated today from  rehab with 35 sessions completed.  Details of the patient's exercise prescription and what He needs to do in order to continue the prescription and progress were discussed with patient.  Patient was given a copy of prescription and goals.  Patient verbalized understanding.  Rickey plans to continue to exercise by walking.   6 Minute Walk     Row Name 02/22/22 1609 07/30/22 1446       6 Minute Walk   Phase Initial Discharge    Distance 1020 feet 1110 feet    Distance % Change -- 8.8 %    Distance Feet Change -- 90 ft    Walk Time 6 minutes 6 minutes    # of Rest Breaks 0 0    MPH 1.93 2.1    METS 2.07 2.25    RPE 10 12    Perceived Dyspnea  0 0    VO2 Peak 7.27 7.88    Symptoms Yes (comment) No    Comments Right knee pain 3/10 --    Resting HR 62 bpm 81 bpm    Resting BP 98/56 124/64    Resting Oxygen Saturation  98 % 90 %    Exercise Oxygen Saturation  during 6 min walk 96 % --    Max Ex. HR 105 bpm 108 bpm    Max Ex. BP 122/58 128/64    2 Minute Post BP 108/60 --

## 2022-09-27 ENCOUNTER — Ambulatory Visit
Admission: RE | Admit: 2022-09-27 | Discharge: 2022-09-27 | Disposition: A | Discharge status: Home / Self Care | Payer: No Typology Code available for payment source | Source: Ambulatory Visit | Attending: Student in an Organized Health Care Education/Training Program | Admitting: Student in an Organized Health Care Education/Training Program

## 2022-09-27 ENCOUNTER — Ambulatory Visit
Payer: No Typology Code available for payment source | Attending: Student in an Organized Health Care Education/Training Program | Admitting: Student in an Organized Health Care Education/Training Program

## 2022-09-27 VITALS — BP 122/62 | HR 72 | Temp 97.1°F | Resp 18 | Ht 69.0 in | Wt 168.0 lb

## 2022-09-27 DIAGNOSIS — G8929 Other chronic pain: Secondary | ICD-10-CM | POA: Insufficient documentation

## 2022-09-27 DIAGNOSIS — M5416 Radiculopathy, lumbar region: Secondary | ICD-10-CM | POA: Insufficient documentation

## 2022-09-27 DIAGNOSIS — M48062 Spinal stenosis, lumbar region with neurogenic claudication: Secondary | ICD-10-CM | POA: Insufficient documentation

## 2022-09-27 DIAGNOSIS — Z981 Arthrodesis status: Secondary | ICD-10-CM | POA: Insufficient documentation

## 2022-09-27 DIAGNOSIS — M533 Sacrococcygeal disorders, not elsewhere classified: Secondary | ICD-10-CM | POA: Insufficient documentation

## 2022-09-27 DIAGNOSIS — G894 Chronic pain syndrome: Secondary | ICD-10-CM | POA: Insufficient documentation

## 2022-09-27 NOTE — Progress Notes (Signed)
Safety precautions to be maintained throughout the outpatient stay will include: orient to surroundings, keep bed in low position, maintain call bell within reach at all times, provide assistance with transfer out of bed and ambulation.

## 2022-09-27 NOTE — Progress Notes (Signed)
Patient: Danny Marquez  Service Category: E/M  Provider: Edward Jolly, MD  DOB: [birth year]  DOS: 09/27/2022  Referring Provider: [facility]  MRN: [id number]  Setting: Ambulatory outpatient  PCP: [facility]  Type: New Patient  Specialty: Interventional Pain Management    Location: Office  Delivery: Face-to-face     Primary Reason(s) for Visit: Encounter for initial evaluation of one or more chronic problems (new to examiner) potentially causing chronic pain, and posing a threat to normal musculoskeletal function. (Level of risk: High) CC: Back Pain (Low right)  HPI  Danny Marquez is a 77 y.o. year old, male patient, who comes for the first time to our practice referred by [facility] for our initial evaluation of his chronic pain. He has Atrial fibrillation with RVR (HCC); Atrial fibrillation (HCC); Anemia; Burning sensation of feet; Chronic back pain; Chronic systolic congestive heart failure (HCC); Coronary arteriosclerosis; Gastroesophageal reflux disease; Generalized ischemic myocardial dysfunction; History of heart disorder; History of myocardial infarction; History of pancreatitis; Hyperlipidemia; Hypertension; Numbness and tingling of foot; Spinal stenosis, lumbar region, with neurogenic claudication; Weakness of foot; History of coronary artery bypass surgery; History of lumbar fusion; Lumbar radiculopathy; S/P fusion of sacroiliac joint (right); Chronic pain syndrome; and Chronic right SI joint pain on their problem list. Today he comes in for evaluation of his Back Pain (Low right)  Pain Assessment: Location: Right, Lower Back Radiating: denies Onset: More than a month ago Duration: Chronic pain Quality: Constant, Sharp Severity: 8 /10 (subjective, self-reported pain score)  Effect on ADL: Limits activities Timing: Constant Modifying factors: rest BP: 122/62  HR: 72  Onset and Duration: Gradual and Present longer than 3 months Cause of pain:  Unknown Severity: Getting worse, NAS-11 at its worse: 10/10, NAS-11 at its best: 6/10, NAS-11 now: 8/10, and NAS-11 on the average: 6/10 Timing: Not influenced by the time of the day Aggravating Factors: Bending, Climbing, Kneeling, Lifiting, Motion, Squatting, Stooping , Twisting, Walking, Walking uphill, and Walking downhill Alleviating Factors: Medications and Resting Associated Problems: Pain that wakes patient up and Pain that does not allow patient to sleep Quality of Pain: Constant and Sharp Previous Examinations or Tests: MRI scan, X-rays, Neurosurgical evaluation, and Orthopedic evaluation Previous Treatments: Epidural steroid injections, Narcotic medications, and Physical Therapy  Danny Marquez is a very pleasant 77 year old male who presents with a chief complaint of low back and right buttock pain.  He has a history of lumbar spinal fusion from L4-S1 that was done in 2017.  He subsequently had a right SI joint fusion in 2022.  His pain is most pronounced over his right L5 region and right SI joint.  He states that performing activities is very painful.  He is unable to stand up straight.  He is managed with pain medications including oxycodone and Lyrica 150 mg twice daily by the [place].  He has had a history of cardiac bypass as well as previous myocardial infarctions.  He is status post the Watchman procedure for atrial fibrillation at [facility].  He is currently not on Eliquis or any other blood thinners other than aspirin 81 mg.  He is status post bilateral knee replacement in 2000 and 2015.  He is also had right shoulder surgery.  His primary pain complaint is his right lower back as well as his right buttock pain.  He has done physical therapy in the past.  He has had spinal and SI joint injections that provided some pain relief.  He denies having  had any recent imaging studies by way of x-rays or even MRI.  He does have a history of PTSD.  He has just finished cardiac rehab  Meds   Current  Outpatient Medications:    allopurinol (ZYLOPRIM) 100 MG tablet, Take 200 mg by mouth daily., Disp: , Rfl:    AMBULATORY NON FORMULARY MEDICATION, Take 65 mg by mouth 3 (three) times a week. Medication Name: iron    Three times per week, Disp: , Rfl:    amiodarone (PACERONE) 100 MG tablet, Take 100 mg by mouth daily., Disp: , Rfl:    aspirin EC 81 MG tablet, Take 81 mg by mouth daily. Swallow whole., Disp: , Rfl:    DONEPEZIL HCL PO, Take 10 mg by mouth., Disp: , Rfl:    empagliflozin (JARDIANCE) 25 MG TABS tablet, Take 12.5 mg by mouth daily., Disp: , Rfl:    losartan (COZAAR) 25 MG tablet, Take 12.5 mg by mouth daily., Disp: , Rfl:    metoprolol tartrate (LOPRESSOR) 25 MG tablet, Take 12.5 mg by mouth daily., Disp: , Rfl:    pantoprazole (PROTONIX) 20 MG tablet, Take 20 mg by mouth daily., Disp: , Rfl:    pregabalin (LYRICA) 150 MG capsule, Take 150 mg by mouth 2 (two) times daily., Disp: , Rfl:    ranolazine (RANEXA) 500 MG 12 hr tablet, Take 500 mg by mouth 2 (two) times daily., Disp: , Rfl:    rosuvastatin (CRESTOR) 5 MG tablet, , Disp: , Rfl:    sertraline (ZOLOFT) 100 MG tablet, Take 100 mg by mouth daily. , Disp: , Rfl:    torsemide (DEMADEX) 20 MG tablet, Take 20 mg by mouth daily., Disp: , Rfl:    TRAZODONE HCL PO, Take 75 mg by mouth., Disp: , Rfl:   Imaging Review   Narrative CLINICAL DATA:  77 year old male with persistent dizziness. Multiple falls.  EXAM: CT CERVICAL SPINE WITHOUT CONTRAST  TECHNIQUE: Multidetector CT imaging of the cervical spine was performed without intravenous contrast. Multiplanar CT image reconstructions were also generated.  COMPARISON:  Head CT today reported separately.  FINDINGS: Alignment: Mildly exaggerated cervical lordosis and probable exaggerated thoracic kyphosis. Mild levoconvex cervical scoliosis. Cervicothoracic junction alignment is within normal limits. Bilateral posterior element alignment is within normal limits.  Skull base  and vertebrae: Visualized skull base is intact. No atlanto-occipital dissociation. No acute osseous abnormality identified. Mild motion artifact at the C5 and C6 level.  Soft tissues and spinal canal: No prevertebral fluid or swelling. No visible canal hematoma. Bulky calcified carotid atherosclerosis in the neck. But otherwise negative visible noncontrast neck soft tissues.  Disc levels: The dominant cervical degenerative finding is right side facet arthropathy, which is severe about the C5 level. No significant cervical spinal stenosis suspected.  Upper chest: Visible upper thoracic levels appear grossly intact. Emphysema versus mild septal thickening in both lung apices. Partially visible subclavian approach left side cardiac pacemaker type leads. Calcified aortic atherosclerosis. Tortuous proximal great vessels.  IMPRESSION: 1. No acute traumatic injury identified in the cervical spine. 2. 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 3. Aortic Atherosclerosis (ICD10-I70.0).  Possible emphysema.   Electronically Signed By: Odessa Fleming M.D. On: 05/20/2021 05:49  Lumbosacral Imaging: Lumbar MR wo contrast: Results for orders placed during the hospital encounter of 03/17/15  MR Lumbar Spine Wo Contrast  Narrative CLINICAL DATA:  77 year old male with chronic lumbar back pain radiating to the right lower extremity with intermittent numbness. Previous steroid injection without relief. Subsequent  encounter.  EXAM: MRI LUMBAR SPINE WITHOUT CONTRAST  TECHNIQUE: Multiplanar, multisequence MR imaging of the lumbar spine was performed. No intravenous contrast was administered.  COMPARISON:  Report of the 10/09/2002 CT Abdomen and Pelvis (no images available).  FINDINGS: Lumbar segmentation appears to be normal and will be designated as such for this report. Mild dextro convex lumbar scoliosis. No spondylolisthesis. Chronic endplate degeneration at  L4-L5 and to a lesser extent L5-S1. No marrow edema or evidence of acute osseous abnormality. Occasional benign vertebral body hemangiomas including posteriorly in the T12 level.  Visualized lower thoracic spinal cord is normal with conus medularis at L1.  Nonvisualization of the right kidney, also reported in 2004. Several small T2 hyperintense areas in the left kidney probably are benign cysts. Other visualized abdominal viscera are within normal limits. Negative visualized posterior paraspinal soft tissues.  T11-T12: Mild disc bulge. Mild facet hypertrophy. No significant stenosis.  T12-L1:  Mild disc bulge.  No stenosis.  L1-L2:  Negative.  L2-L3: Mild left far lateral disc bulge and left greater than right facet hypertrophy. No stenosis.  L3-L4: Left eccentric circumferential disc osteophyte complex with superimposed small central cephalad disc extrusion (series 2, image 11 and series 5, image 22). Mild facet and ligament flavum hypertrophy greater on the left. Mild to moderate left lateral recess stenosis with borderline to mild spinal stenosis. Borderline to mild left L3 foraminal stenosis.  L4-L5: Disc space loss. Bulky right eccentric circumferential disc osteophyte complex. Possible vacuum disc. Moderate to severe facet and ligament flavum hypertrophy greater on the right. Epidural lipomatosis. Severe right lateral recess stenosis (series 5, image 30) with moderate to severe spinal stenosis. Severe right and moderate left L4 foraminal stenosis.  L5-S1: Disc space loss. Circumferential disc bulge with broad-based posterior component, but superimposed right paracentral cephalad disc extrusion best seen on series 2, image 11 and series 5, image 33. This most affects the proximal course of the exiting right L5 nerve. Superimposed epidural lipomatosis which is primarily responsible for a tray full well appearance of the thecal sac. Moderate to severe facet hypertrophy  greater on the right. No lateral recess stenosis at the level of the disc space. No significant left foraminal stenosis.  IMPRESSION: 1. Symptomatic abnormality favored to be advanced L4-L5 and L5-S1 disc and endplate degeneration which severely affects the course of the right L5 nerve at both the L4-L5 (right lateral recess stenosis), and L5-S1 (disc herniation and right foraminal stenosis) levels. 2. Superimposed moderate to severe multifactorial spinal stenosis at both the L4-L5 and L5-S1 levels. 3. Moderate multifactorial left lateral recess stenosis at L3-L4 with up to mild spinal and left foraminal stenosis.   Electronically Signed By: Genevie Ann M.D. On: 03/17/2015 13:59   Complexity Note: Imaging results reviewed.                         ROS  Cardiovascular: Heart trouble, Abnormal heart rhythm, Daily Aspirin intake, High blood pressure, Chest pain, Heart attack ( Date: 2000), Heart surgery, Pacemaker or defibrillator, and Weak heart (CHF) Pulmonary or Respiratory: No reported pulmonary signs or symptoms such as wheezing and difficulty taking a deep full breath (Asthma), difficulty blowing air out (Emphysema), coughing up mucus (Bronchitis), persistent dry cough, or temporary stoppage of breathing during sleep Neurological: No reported neurological signs or symptoms such as seizures, abnormal skin sensations, urinary and/or fecal incontinence, being born with an abnormal open spine and/or a tethered spinal cord Psychological-Psychiatric: Psychiatric disorderPTSD Gastrointestinal: Reflux  or heatburn Genitourinary  Born with only 1 kidney Hematological: No reported hematological signs or symptoms such as prolonged bleeding, low or poor functioning platelets, bruising or bleeding easily, hereditary bleeding problems, low energy levels due to low hemoglobin or being anemic Endocrine: No reported endocrine signs or symptoms such as high or low blood sugar, rapid heart rate due to high  thyroid levels, obesity or weight gain due to slow thyroid or thyroid disease Rheumatologic: No reported rheumatological signs and symptoms such as fatigue, joint pain, tenderness, swelling, redness, heat, stiffness, decreased range of motion, with or without associated rash Musculoskeletal: Negative for myasthenia gravis, muscular dystrophy, multiple sclerosis or malignant hyperthermia Work History: Retired  Allergies  Danny Marquez is allergic to codeine.  Laboratory Chemistry Profile   Renal Lab Results  Component Value Date   BUN 24 (H) 03/03/2022   CREATININE 1.45 (H) 03/03/2022   GFRAA >60 08/06/2019   GFRNONAA 50 (L) 03/03/2022   PROTEINUR NEGATIVE 03/03/2022     Electrolytes Lab Results  Component Value Date   NA 139 03/03/2022   K 4.3 03/03/2022   CL 110 03/03/2022   CALCIUM 9.8 03/03/2022   MG 2.1 05/20/2021     Hepatic Lab Results  Component Value Date   AST 45 (H) 03/03/2022   ALT 17 03/03/2022   ALBUMIN 3.1 (L) 03/03/2022   ALKPHOS 85 03/03/2022   LIPASE 356 (H) 08/06/2019   AMMONIA 29 08/06/2019     ID Lab Results  Component Value Date   SARSCOV2NAA NOT DETECTED 03/02/2019     Bone No results found for: "VD25OH", "VD125OH2TOT", "[id number]", "NW2956OZ3VD2125OH2", "25OHVITD1", "25OHVITD2", "25OHVITD3", "TESTOFREE", "TESTOSTERONE"   Endocrine Lab Results  Component Value Date   GLUCOSE 113 (H) 03/03/2022   GLUCOSEU >=500 (A) 03/03/2022   TSH 2.305 12/08/2016     Neuropathy No results found for: "VITAMINB12", "FOLATE", "HGBA1C", "HIV"   CNS No results found for: "COLORCSF", "APPEARCSF", "RBCCOUNTCSF", "WBCCSF", "POLYSCSF", "LYMPHSCSF", "EOSCSF", "PROTEINCSF", "GLUCCSF", "JCVIRUS", "CSFOLI", "IGGCSF", "LABACHR", "ACETBL"   Inflammation (CRP: Acute  ESR: Chronic) Lab Results  Component Value Date   LATICACIDVEN 1.1 08/06/2019     Rheumatology No results found for: "RF", "ANA", "LABURIC", "URICUR", "LYMEIGGIGMAB", "LYMEABIGMQN", "HLAB27"    Coagulation Lab Results  Component Value Date   INR 1.05 12/08/2016   LABPROT 13.7 12/08/2016   APTT 32 12/08/2016   PLT 205 03/03/2022     Cardiovascular Lab Results  Component Value Date   BNP 279.7 (H) 03/03/2022   TROPONINI <0.03 03/02/2017   HGB 12.4 (L) 03/03/2022   HCT 38.9 (L) 03/03/2022     Screening Lab Results  Component Value Date   SARSCOV2NAA NOT DETECTED 03/02/2019   COVIDSOURCE NASOPHARYNGEAL 03/02/2019     Cancer No results found for: "CEA", "CA125", "LABCA2"   Allergens No results found for: "ALMOND", "APPLE", "ASPARAGUS", "AVOCADO", "BANANA", "BARLEY", "BASIL", "BAYLEAF", "GREENBEAN", "LIMABEAN", "WHITEBEAN", "BEEFIGE", "REDBEET", "BLUEBERRY", "BROCCOLI", "CABBAGE", "MELON", "CARROT", "CASEIN", "CASHEWNUT", "CAULIFLOWER", "CELERY"     Note: Lab results reviewed.  PFSH  Drug: Danny Marquez  reports that he does not currently use drugs. Alcohol:  reports that he does not currently use alcohol after a past usage of about 24.0 standard drinks of alcohol per week. Tobacco:  reports that he has quit smoking. His smoking use included cigarettes. He has never used smokeless tobacco. Medical:  has a past medical history of A-fib (HCC), CHF (congestive heart failure) (HCC), Chronic kidney disease, Hypertension, Myocardial infarction (HCC), and Solitary kidney. Family: family history includes Heart attack  in his father.  Past Surgical History:  Procedure Laterality Date   BACK SURGERY     CARDIOVERSION N/A 01/23/2017   Procedure: CARDIOVERSION;  Surgeon: Dalia Heading, MD;  Location: [facility] ORS;  Service: Cardiovascular;  Laterality: N/A;   CARDIOVERSION N/A 02/01/2017   Procedure: CARDIOVERSION;  Surgeon: Dalia Heading, MD;  Location: [facility] ORS;  Service: Cardiovascular;  Laterality: N/A;   COLONOSCOPY WITH PROPOFOL N/A 03/05/2019   Procedure: COLONOSCOPY WITH PROPOFOL;  Surgeon: Christena Deem, MD;  Location: [facility] ENDOSCOPY;  Service: Endoscopy;  Laterality:  N/A;   CORONARY ARTERY BYPASS GRAFT     ESOPHAGOGASTRODUODENOSCOPY (EGD) WITH PROPOFOL N/A 03/05/2019   Procedure: ESOPHAGOGASTRODUODENOSCOPY (EGD) WITH PROPOFOL;  Surgeon: Christena Deem, MD;  Location: [facility] ENDOSCOPY;  Service: Endoscopy;  Laterality: N/A;   JOINT REPLACEMENT     both knees 13 years ago, partial shoulder right   Active Ambulatory Problems    Diagnosis Date Noted   Atrial fibrillation with RVR (HCC) 12/08/2016   Atrial fibrillation (HCC) 12/08/2016   Anemia 03/01/2017   Burning sensation of feet 01/08/2018   Chronic back pain 03/23/2019   Chronic systolic congestive heart failure (HCC) 08/09/2014   Coronary arteriosclerosis 03/23/2019   Gastroesophageal reflux disease 03/23/2019   Generalized ischemic myocardial dysfunction 03/23/2019   History of heart disorder 03/23/2019   History of myocardial infarction 03/23/2019   History of pancreatitis 03/23/2019   Hyperlipidemia 08/09/2014   Hypertension 08/09/2014   Numbness and tingling of foot 01/15/2018   Spinal stenosis, lumbar region, with neurogenic claudication 03/23/2019   Weakness of foot 01/15/2018   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   History of lumbar fusion 03/23/2019   Lumbar radiculopathy 09/27/2022   S/P fusion of sacroiliac joint (right) 09/27/2022   Chronic pain syndrome 09/27/2022   Chronic right SI joint pain 09/27/2022   Resolved Ambulatory Problems    Diagnosis Date Noted   No Resolved Ambulatory Problems   Past Medical History:  Diagnosis Date   A-fib (HCC)    CHF (congestive heart failure) (HCC)    Chronic kidney disease    Myocardial infarction (HCC)    Solitary kidney    Constitutional Exam  General appearance: Well nourished, well developed, and well hydrated. In no apparent acute distress Vitals:   09/27/22 1314  BP: 122/62  Pulse: 72  Resp: 18  Temp: (!) 97.1 F (36.2 C)  SpO2: 100%  Weight: 168 lb (76.2 kg)  Height: 5\' 9"  (1.753 m)   BMI  Assessment: Estimated body mass index is 24.81 kg/m as calculated from the following:   Height as of this encounter: 5\' 9"  (1.753 m).   Weight as of this encounter: 168 lb (76.2 kg).  BMI interpretation table: BMI level Category Range association with higher incidence of chronic pain  <18 kg/m2 Underweight   18.5-24.9 kg/m2 Ideal body weight   25-29.9 kg/m2 Overweight Increased incidence by 20%  30-34.9 kg/m2 Obese (Class I) Increased incidence by 68%  35-39.9 kg/m2 Severe obesity (Class II) Increased incidence by 136%  >40 kg/m2 Extreme obesity (Class III) Increased incidence by 254%   Patient's current BMI Ideal Body weight  Body mass index is 24.81 kg/m. Ideal body weight: 70.7 kg (155 lb 13.8 oz) Adjusted ideal body weight: 72.9 kg (160 lb 11.5 oz)   BMI Readings from Last 4 Encounters:  09/27/22 24.81 kg/m  07/30/22 27.79 kg/m  02/22/22 26.79 kg/m  05/20/21 26.30 kg/m   Wt Readings from Last 4 Encounters:  09/27/22 168 lb (76.2 kg)  07/30/22 170 lb 14.4 oz (77.5 kg)  02/22/22 164 lb 11.2 oz (74.7 kg)  05/20/21 173 lb (78.5 kg)    Psych/Mental status: Alert, oriented x 3 (person, place, & time)       Eyes: PERLA Respiratory: No evidence of acute respiratory distress  Lumbar Spine Area Exam  Skin & Axial Inspection: Well healed scar from previous spine surgery detected Alignment: Symmetrical Functional ROM: Pain restricted ROM affecting primarily the right Stability: No instability detected Muscle Tone/Strength: Functionally intact. No obvious neuro-muscular anomalies detected. Sensory (Neurological): Musculoskeletal pain pattern possibly dermatomal and neurogenic Palpation: No palpable anomalies       Provocative Tests:  Lumbar quadrant test (Kemp's test): (+) on the right for foraminal stenosis as well as right facet joint pain Lateral bending test:+) on the right for foraminal stenosis as well as right facet joint pain Patrick's Maneuver: (+) for right-sided  S-I arthralgia             FABER* test: (+) for right-sided S-I arthralgia              Gait & Posture Assessment  Ambulation: Limited Gait: Antalgic gait (limping) Posture: Difficulty standing up straight, due to pain  Lower Extremity Exam    Side: Right lower extremity  Side: Left lower extremity  Stability: No instability observed          Stability: No instability observed          Skin & Extremity Inspection: Skin color, temperature, and hair growth are WNL. No peripheral edema or cyanosis. No masses, redness, swelling, asymmetry, or associated skin lesions. No contractures.  Skin & Extremity Inspection: Skin color, temperature, and hair growth are WNL. No peripheral edema or cyanosis. No masses, redness, swelling, asymmetry, or associated skin lesions. No contractures.  Functional ROM: Unrestricted ROM                  Functional ROM: Unrestricted ROM                  Muscle Tone/Strength: Functionally intact. No obvious neuro-muscular anomalies detected.  Muscle Tone/Strength: Functionally intact. No obvious neuro-muscular anomalies detected.  Sensory (Neurological): Unimpaired        Sensory (Neurological): Unimpaired        DTR: Patellar: deferred today Achilles: deferred today Plantar: deferred today  DTR: Patellar: deferred today Achilles: deferred today Plantar: deferred today  Palpation: No palpable anomalies  Palpation: No palpable anomalies    Assessment  Primary Diagnosis & Pertinent Problem List: The primary encounter diagnosis was History of lumbar fusion (L4-S1). Diagnoses of Chronic right SI joint pain, S/P fusion of sacroiliac joint (right), Spinal stenosis, lumbar region, with neurogenic claudication, Lumbar radiculopathy (right), and Chronic pain syndrome were also pertinent to this visit.  Visit Diagnosis (New problems to examiner): 1. History of lumbar fusion (L4-S1)   2. Chronic right SI joint pain   3. S/P fusion of sacroiliac joint (right)   4. Spinal  stenosis, lumbar region, with neurogenic claudication   5. Lumbar radiculopathy (right)   6. Chronic pain syndrome    Plan of Care (Initial workup plan)    1. History of lumbar fusion (L4-S1) - DG Lumbar Spine Complete W/Bend; Future - MR LUMBAR SPINE WO CONTRAST; Future  2. Chronic right SI joint pain - DG Si Joints; Future - DG HIP UNILAT W OR W/O PELVIS 2-3 VIEWS RIGHT; Future  3. S/P fusion of sacroiliac joint (right) - DG  Si Joints; Future  4. Spinal stenosis, lumbar region, with neurogenic claudication - DG Lumbar Spine Complete W/Bend; Future - MR LUMBAR SPINE WO CONTRAST; Future  5. Lumbar radiculopathy (right) - DG Lumbar Spine Complete W/Bend; Future - MR LUMBAR SPINE WO CONTRAST; Future  6. Chronic pain syndrome - DG Lumbar Spine Complete W/Bend; Future - DG Si Joints; Future - DG HIP UNILAT W OR W/O PELVIS 2-3 VIEWS RIGHT; Future - MR LUMBAR SPINE WO CONTRAST; Future    Imaging Orders         DG Lumbar Spine Complete W/Bend         DG Si Joints         DG HIP UNILAT W OR W/O PELVIS 2-3 VIEWS RIGHT         MR LUMBAR SPINE WO CONTRAST       Interventional management options: Danny Marquez was informed that there is no guarantee that he would be a candidate for interventional therapies. The decision will be based on the results of diagnostic studies, as well as Danny Marquez's risk profile.  Procedure(s) under consideration:  Right piriformis injection Right facet medial branch nerve block Right lumbar epidural steroid injection versus caudal Right peripheral nerve stimulation Spinal cord stimulator trial   Provider-requested follow-up: Return in about 3 weeks (around 10/18/2022) for to review xrays and L-MRI and discuss treatment plan.  No future appointments.  Duration of encounter: 60minutes.  Total time on encounter, as per AMA guidelines included both the face-to-face and non-face-to-face time personally spent by the physician and/or other qualified  health care professional(s) on the day of the encounter (includes time in activities that require the physician or other qualified health care professional and does not include time in activities normally performed by clinical staff). Physician's time may include the following activities when performed: Preparing to see the patient (e.g., pre-charting review of records, searching for previously ordered imaging, lab work, and nerve conduction tests) Review of prior analgesic pharmacotherapies. Reviewing PMP Interpreting ordered tests (e.g., lab work, imaging, nerve conduction tests) Performing post-procedure evaluations, including interpretation of diagnostic procedures Obtaining and/or reviewing separately obtained history Performing a medically appropriate examination and/or evaluation Counseling and educating the patient/family/caregiver Ordering medications, tests, or procedures Referring and communicating with other health care professionals (when not separately reported) Documenting clinical information in the electronic or other health record Independently interpreting results (not separately reported) and communicating results to the patient/ family/caregiver Care coordination (not separately reported)  Note by: Edward JollyBilal Jadier Rockers, MD Date: 09/27/2022; Time: 3:22 PM

## 2022-10-03 ENCOUNTER — Other Ambulatory Visit: Payer: Self-pay | Admitting: Student in an Organized Health Care Education/Training Program

## 2022-10-03 DIAGNOSIS — G8929 Other chronic pain: Secondary | ICD-10-CM

## 2022-10-03 DIAGNOSIS — M5416 Radiculopathy, lumbar region: Secondary | ICD-10-CM

## 2022-11-13 ENCOUNTER — Ambulatory Visit (HOSPITAL_COMMUNITY)
Admission: RE | Admit: 2022-11-13 | Discharge: 2022-11-13 | Disposition: A | Discharge status: Home / Self Care | Payer: No Typology Code available for payment source | Source: Ambulatory Visit | Attending: Student in an Organized Health Care Education/Training Program | Admitting: Student in an Organized Health Care Education/Training Program

## 2022-11-13 DIAGNOSIS — M5416 Radiculopathy, lumbar region: Secondary | ICD-10-CM

## 2022-11-13 DIAGNOSIS — G8929 Other chronic pain: Secondary | ICD-10-CM | POA: Diagnosis present

## 2022-11-13 NOTE — Progress Notes (Signed)
Per order, Changed device settings for MRI to  DOO at 80 bpm  MRI mode/Tachy-therapies to off  Will program device back to pre-MRI settings after completion of exam, and send transmission

## 2022-11-15 ENCOUNTER — Telehealth: Payer: Self-pay

## 2022-11-15 ENCOUNTER — Ambulatory Visit
Payer: No Typology Code available for payment source | Attending: Student in an Organized Health Care Education/Training Program | Admitting: Student in an Organized Health Care Education/Training Program

## 2022-11-15 ENCOUNTER — Encounter: Payer: Self-pay | Admitting: Student in an Organized Health Care Education/Training Program

## 2022-11-15 VITALS — BP 121/65 | HR 96 | Temp 97.1°F | Resp 17 | Ht 69.0 in | Wt 175.0 lb

## 2022-11-15 DIAGNOSIS — G894 Chronic pain syndrome: Secondary | ICD-10-CM | POA: Diagnosis present

## 2022-11-15 DIAGNOSIS — M48062 Spinal stenosis, lumbar region with neurogenic claudication: Secondary | ICD-10-CM | POA: Diagnosis present

## 2022-11-15 DIAGNOSIS — Z981 Arthrodesis status: Secondary | ICD-10-CM | POA: Diagnosis present

## 2022-11-15 DIAGNOSIS — M47816 Spondylosis without myelopathy or radiculopathy, lumbar region: Secondary | ICD-10-CM | POA: Insufficient documentation

## 2022-11-15 MED ORDER — BUPRENORPHINE 7.5 MCG/HR TD PTWK: 1.0000 | MEDICATED_PATCH | TRANSDERMAL | 0 refills | Status: DC

## 2022-11-15 MED ORDER — BUPRENORPHINE 5 MCG/HR TD PTWK: 1.0000 | MEDICATED_PATCH | TRANSDERMAL | 0 refills | Status: AC

## 2022-11-15 NOTE — Progress Notes (Signed)
PROVIDER NOTE: Information contained herein reflects review and annotations entered in association with encounter. Interpretation of such information and data should be left to medically-trained personnel. Information provided to patient can be located elsewhere in the medical record under "Patient Instructions". Document created using STT-dictation technology, any transcriptional errors that may result from process are unintentional.    Patient: Danny Marquez  Service Category: E/M  Provider: Gillis Santa, MD  DOB: 04/24/1946  DOS: 11/15/2022  Referring Provider: [facility]  MRN: [id number]  Specialty: Interventional Pain Management  [facility]  Type: Established Patient  Setting: Ambulatory outpatient    Location: Office  Delivery: Face-to-face     HPI  [name], a 77 y.o. year old male, is here today because of his Lumbar facet arthropathy [M47.816]. [name] primary complain today is Back Pain (Lower right)  Pertinent problems: [name] has History of myocardial infarction; Spinal stenosis, lumbar region, with neurogenic claudication; History of lumbar fusion; Lumbar radiculopathy; S/P fusion of sacroiliac joint (right); Chronic pain syndrome; Chronic right SI joint pain; and Lumbar spondylosis on their pertinent problem list. Pain Assessment: Severity of Chronic pain is reported as a 7 /10. Location: Back Right, Lower/"when pain gets really bad it travels up to mid back. Onset: More than a month ago. Quality: Constant, Sharp. Timing: Constant. Modifying factor(s): rest, meds. Vitals:  height is '5\' 9"'$  (1.753 m) and weight is 175 lb (79.4 kg). His temporal temperature is 97.1 F (36.2 C) (abnormal). His blood pressure is 121/65 and his pulse is 96. His respiration is 17 and oxygen saturation is 98%.  BMI: Estimated body mass index is 25.84 kg/m as calculated from the following:   Height as of this encounter: '5\' 9"'$  (1.753 m).   Weight as of this  encounter: 175 lb (79.4 kg). Last encounter: 09/27/2022. Last procedure: Visit date not found.  Reason for encounter: follow-up evaluation to review L-MRI  [name] endorses right low back pain as well as right pain overlying his SI joint.  He has a history of lumbar spinal fusion as well as SI joint fusion, both of which were not helpful for his condition.  He is accompanied today by his wife.  We reviewed his MRI which is below.   HPI for initial clinic visit: [name] is a very pleasant 77 year old male who presents with a chief complaint of low back and right buttock pain. He has a history of lumbar spinal fusion from L4-S1 that was done in 11-23-15. He subsequently had a right SI joint fusion in 2020/11/22. His pain is most pronounced over his right L5 region and right SI joint. He states that performing activities is very painful. He is unable to stand up straight. He is managed with pain medications including oxycodone and Lyrica 150 mg twice daily by the [place]. He has had a history of cardiac bypass as well as previous myocardial infarctions. He is status post the Watchman procedure for atrial fibrillation at [facility]. He is currently not on Eliquis or any other blood thinners other than aspirin 81 mg. He is status post bilateral knee replacement in 11-23-98 and 11-22-2013. He is also had right shoulder surgery. His primary pain complaint is his right lower back as well as his right buttock pain. He has done physical therapy in the past. He has had spinal and SI joint injections that provided some pain relief. He denies having had any recent imaging studies by way of x-rays or even  MRI. He does have a history of PTSD. He has just finished cardiac rehab    Constitutional: Denies any fever or chills Gastrointestinal: No reported hemesis, hematochezia, vomiting, or acute GI distress Musculoskeletal:  Right lumbar spine and right SI joint pain Neurological: No reported episodes of acute onset apraxia, aphasia,  dysarthria, agnosia, amnesia, paralysis, loss of coordination, or loss of consciousness  Medication Review  AMBULATORY NON FORMULARY MEDICATION, Donepezil HCl, allopurinol, amiodarone, aspirin EC, buprenorphine, empagliflozin, losartan, metoprolol tartrate, oxycodone, pantoprazole, pregabalin, ranolazine, rosuvastatin, sertraline, torsemide, and traZODone HCl  History Review  Allergy: Mr. Marquez is allergic to codeine. Drug: [name]  reports that he does not currently use drugs. Alcohol:  reports that he does not currently use alcohol after a past usage of about 24.0 standard drinks of alcohol per week. Tobacco:  reports that he has quit smoking. His smoking use included cigarettes. He has never used smokeless tobacco. Social: [name]  reports that he has quit smoking. His smoking use included cigarettes. He has never used smokeless tobacco. He reports that he does not currently use alcohol after a past usage of about 24.0 standard drinks of alcohol per week. He reports that he does not currently use drugs. Medical:  has a past medical history of A-fib ([place]), CHF (congestive heart failure) ([place]), Chronic kidney disease, Hypertension, Myocardial infarction ([place]), and Solitary kidney. Surgical: [name]  has a past surgical history that includes Coronary artery bypass graft; Back surgery; CARDIOVERSION (N/A, 01/23/2017); CARDIOVERSION (N/A, 02/01/2017); Joint replacement; Colonoscopy with propofol (N/A, 03/05/2019); and Esophagogastroduodenoscopy (egd) with propofol (N/A, 03/05/2019). Family: family history includes Heart attack in his father.  Laboratory Chemistry Profile   Renal Lab Results  Component Value Date   BUN 24 (H) 03/03/2022   CREATININE 1.45 (H) 03/03/2022   GFRAA >60 08/06/2019   GFRNONAA 50 (L) 03/03/2022    Hepatic Lab Results  Component Value Date   AST 45 (H) 03/03/2022   ALT 17 03/03/2022   ALBUMIN 3.1 (L) 03/03/2022   ALKPHOS 85 03/03/2022   LIPASE 356 (H)  08/06/2019   AMMONIA 29 08/06/2019    Electrolytes Lab Results  Component Value Date   NA 139 03/03/2022   K 4.3 03/03/2022   CL 110 03/03/2022   CALCIUM 9.8 03/03/2022   MG 2.1 05/20/2021    Bone No results found for: "VD25OH", "VD125OH2TOT", "[id number]", "[id number]", "25OHVITD1", "25OHVITD2", "25OHVITD3", "TESTOFREE", "TESTOSTERONE"  Inflammation (CRP: Acute Phase) (ESR: Chronic Phase) Lab Results  Component Value Date   LATICACIDVEN 1.1 08/06/2019         Note: Above Lab results reviewed.  Recent Imaging Review  MR LUMBAR SPINE WO CONTRAST CLINICAL DATA:  Low back pain  EXAM: MRI LUMBAR SPINE WITHOUT CONTRAST  TECHNIQUE: Multiplanar, multisequence MR imaging of the lumbar spine was performed. No intravenous contrast was administered.  COMPARISON:  03/17/2015 MRI lumbar spine  FINDINGS: Segmentation: 5 lumbar type vertebral bodies. The last fully formed disc space is labeled L5-S1.  Alignment:  No significant listhesis.  Lumbar dextrocurvature.  Vertebrae: No acute fracture or suspicious osseous lesion. Status post interval posterior fusion and decompression L4-S1, with interbody disc spacers at L4-L5 and L5-S1. Right sacroiliac screws also noted.  Conus medullaris and cauda equina: Conus extends to the L1-L2 level. Conus and cauda equina appear normal.  Paraspinal and other soft tissues: Atrophy of the inferior paraspinous musculature. No lymphadenopathy.  Disc levels:  T11-T12: Seen only on the sagittal images. Minimal disc bulge. No spinal canal stenosis or neural  foraminal narrowing.  T12-L1: Minimal disc bulge. Mild facet arthropathy. No spinal canal stenosis or neural foraminal narrowing.  L1-L2: No significant disc bulge. Mild left facet arthropathy. No spinal canal stenosis or neural foraminal narrowing.  L2-L3: Mild disc bulge, with mild-to-moderate left and mild right facet arthropathy, which have progressed from the prior exam. Ligamentum  flavum hypertrophy. Narrowing of the lateral recesses. No spinal canal stenosis. Mild left neural foraminal narrowing, which is new from prior exam.  L3-L4: Moderate disc bulge and moderate to severe facet arthropathy, which have progressed from the prior exam. Ligamentum flavum hypertrophy. Narrowing of the lateral recesses. Mild spinal canal stenosis, with mild right and moderate left neural foraminal narrowing, which have progressed from prior exam.  L4-L5: Status post fusion and decompression. No spinal canal stenosis or neural foraminal narrowing.  L5-S1: Status post fusion and decompression. No spinal canal stenosis. Mild right neural foraminal narrowing, which appears improved compared to 2016  IMPRESSION: 1. Status post interval fusion and decompression L4-S1, with resolution of the previously noted spinal canal stenosis at L4-L5 and L5-S1. Mild right neural foraminal narrowing at L5-S1 appears improved compared to 2016 MRI. 2. L3-L4 mild spinal canal stenosis with mild right and moderate left neural foraminal narrowing, which have progressed from the prior exam. Narrowing of the lateral recesses at this level could affect the descending L4 nerve roots. 3. L2-L3 mild left neural foraminal narrowing, which is new from the prior exam. Narrowing of the lateral recesses at this level could affect the descending L3 nerve roots.  Electronically Signed   By: Merilyn Baba M.D.   On: 11/14/2022 04:01 Note: Reviewed        Physical Exam  General appearance: Well nourished, well developed, and well hydrated. In no apparent acute distress Mental status: Alert, oriented x 3 (person, place, & time)       Respiratory: No evidence of acute respiratory distress Eyes: PERLA Vitals: BP 121/65   Pulse 96   Temp (!) 97.1 F (36.2 C) (Temporal)   Resp 17   Ht '5\' 9"'$  (1.753 m)   Wt 175 lb (79.4 kg)   SpO2 98%   BMI 25.84 kg/m  BMI: Estimated body mass index is 25.84 kg/m as  calculated from the following:   Height as of this encounter: '5\' 9"'$  (1.753 m).   Weight as of this encounter: 175 lb (79.4 kg). Ideal: Ideal body weight: 70.7 kg (155 lb 13.8 oz) Adjusted ideal body weight: 74.2 kg (163 lb 8.3 oz)  Lumbar Spine Area Exam  Skin & Axial Inspection: Well healed scar from previous spine surgery detected Alignment: Symmetrical Functional ROM: Pain restricted ROM affecting primarily the right Stability: No instability detected Muscle Tone/Strength: Functionally intact. No obvious neuro-muscular anomalies detected. Sensory (Neurological): Musculoskeletal pain pattern possibly dermatomal and neurogenic Palpation: No palpable anomalies       Provocative Tests:   Lumbar quadrant test (Kemp's test): (+) on the right for foraminal stenosis as well as right facet joint pain Lateral bending test:+) on the right for foraminal stenosis as well as right facet joint pain Patrick's Maneuver: (+) for right-sided S-I arthralgia             FABER* test: (+) for right-sided S-I arthralgia               Gait & Posture Assessment  Ambulation: Limited Gait: Antalgic gait (limping) Posture: Difficulty standing up straight, due to pain  Lower Extremity Exam      Side: Right  lower extremity   Side: Left lower extremity  Stability: No instability observed           Stability: No instability observed          Skin & Extremity Inspection: Skin color, temperature, and hair growth are WNL. No peripheral edema or cyanosis. No masses, redness, swelling, asymmetry, or associated skin lesions. No contractures.   Skin & Extremity Inspection: Skin color, temperature, and hair growth are WNL. No peripheral edema or cyanosis. No masses, redness, swelling, asymmetry, or associated skin lesions. No contractures.  Functional ROM: Unrestricted ROM                   Functional ROM: Unrestricted ROM                  Muscle Tone/Strength: Functionally intact. No obvious neuro-muscular anomalies  detected.   Muscle Tone/Strength: Functionally intact. No obvious neuro-muscular anomalies detected.  Sensory (Neurological): Unimpaired         Sensory (Neurological): Unimpaired        DTR: Patellar: deferred today Achilles: deferred today Plantar: deferred today   DTR: Patellar: deferred today Achilles: deferred today Plantar: deferred today  Palpation: No palpable anomalies   Palpation: No palpable anomalies     Assessment   Diagnosis  1. Lumbar facet arthropathy   2. Lumbar spondylosis   3. Spinal stenosis, lumbar region, with neurogenic claudication   4. History of lumbar fusion (L4-S1)   5. Chronic pain syndrome      Updated Problems: Problem  Lumbar Spondylosis    Plan of Care  I discussed treatment plan at length with patient.  Patient has limited options from an interventional standpoint.  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  He does have facet arthropathy at L2-L3 and L3-L4 which is severe.  He also has lateral recess stenosis which could be affecting the L4 and L3 nerve roots.  Given that his pain is predominantly axial and overlying his SI joint, we discussed right L2, L3, L4 facet medial branch nerve blocks and right trigger point injection in his right buttock area.  I also discussed spinal cord stimulator trial with him.  If his lumbar facet medial branch nerve blocks are not helpful, this is a treatment that we can further pursue.  Patient endorsed understanding.   Pharmacotherapy (Medications Ordered): Meds ordered this encounter  Medications   buprenorphine (BUTRANS) 5 MCG/HR PTWK    Sig: Place 1 patch onto the skin once a week for 28 days.    Dispense:  4 patch    Refill:  0    Chronic Pain: STOP Act (Not applicable) Fill 1 day early if closed on refill date. Avoid benzodiazepines within 8 hours of opioids   buprenorphine (BUTRANS) 7.5 MCG/HR    Sig: Place 1 patch onto the skin once a week for 28 days.    Dispense:  4  patch    Refill:  0    Chronic Pain: STOP Act (Not applicable) Fill 1 day early if closed on refill date. Avoid benzodiazepines within 8 hours of opioids   Orders:  Orders Placed This Encounter  Procedures   LUMBAR FACET(MEDIAL BRANCH NERVE BLOCK) MBNB    Standing Status:   Future    Standing Expiration Date:   02/15/2023    Scheduling Instructions:     Procedure: Lumbar facet block (AKA.: Lumbosacral medial branch nerve block)     Side: Right     Level:  L2-3, L3-4, Facets ( L2, L3, L4, Medial Branch)     Sedation: Patient's choice.     Timeframe: ASAA    Order Specific Question:   Where will this procedure be performed?    Answer:   [facility]   TRIGGER POINT INJECTION    Standing Status:   Future    Standing Expiration Date:   02/15/2023    Scheduling Instructions:     Right lower back    Order Specific Question:   Where will this procedure be performed?    Answer:   [facility]   Follow-up plan:   Return in about 1 week (around 11/22/2022) for Right L2,3,4 MBNB + Right SI-J TPI.     Recent Visits Date Type Provider Dept  09/27/22 Office Visit Gillis Santa, MD [facility]  Showing recent visits within past 90 days and meeting all other requirements Today's Visits Date Type Provider Dept  11/15/22 Office Visit Gillis Santa, MD [facility]  Showing today's visits and meeting all other requirements Future Appointments No visits were found meeting these conditions. Showing future appointments within next 90 days and meeting all other requirements  I discussed the assessment and treatment plan with the patient. The patient was provided an opportunity to ask questions and all were answered. The patient agreed with the plan and demonstrated an understanding of the instructions.  Patient advised to call back or seek an in-person evaluation if the symptoms or condition worsens.  Duration of encounter: 53mnutes.  Total time on encounter, as  per AMA guidelines included both the face-to-face and non-face-to-face time personally spent by the physician and/or other qualified health care professional(s) on the day of the encounter (includes time in activities that require the physician or other qualified health care professional and does not include time in activities normally performed by clinical staff). Physician's time may include the following activities when performed: Preparing to see the patient (e.g., pre-charting review of records, searching for previously ordered imaging, lab work, and nerve conduction tests) Review of prior analgesic pharmacotherapies. Reviewing PMP Interpreting ordered tests (e.g., lab work, imaging, nerve conduction tests) Performing post-procedure evaluations, including interpretation of diagnostic procedures Obtaining and/or reviewing separately obtained history Performing a medically appropriate examination and/or evaluation Counseling and educating the patient/family/caregiver Ordering medications, tests, or procedures Referring and communicating with other health care professionals (when not separately reported) Documenting clinical information in the electronic or other health record Independently interpreting results (not separately reported) and communicating results to the patient/ family/caregiver Care coordination (not separately reported)  Note by: BGillis Santa MD Date: 11/15/2022; Time: 9:38 AM

## 2022-11-15 NOTE — Patient Instructions (Signed)
GENERAL RISKS AND COMPLICATIONS ° °What are the risk, side effects and possible complications? °Generally speaking, most procedures are safe.  However, with any procedure there are risks, side effects, and the possibility of complications.  The risks and complications are dependent upon the sites that are lesioned, or the type of nerve block to be performed.  The closer the procedure is to the spine, the more serious the risks are.  Great care is taken when placing the radio frequency needles, block needles or lesioning probes, but sometimes complications can occur. °Infection: Any time there is an injection through the skin, there is a risk of infection.  This is why sterile conditions are used for these blocks.  There are four possible types of infection. °Localized skin infection. °Central Nervous System Infection-This can be in the form of Meningitis, which can be deadly. °Epidural Infections-This can be in the form of an epidural abscess, which can cause pressure inside of the spine, causing compression of the spinal cord with subsequent paralysis. This would require an emergency surgery to decompress, and there are no guarantees that the patient would recover from the paralysis. °Discitis-This is an infection of the intervertebral discs.  It occurs in about 1% of discography procedures.  It is difficult to treat and it may lead to surgery. ° °      2. Pain: the needles have to go through skin and soft tissues, will cause soreness. °      3. Damage to internal structures:  The nerves to be lesioned may be near blood vessels or   ° other nerves which can be potentially damaged. °      4. Bleeding: Bleeding is more common if the patient is taking blood thinners such as  aspirin, Coumadin, Ticiid, Plavix, etc., or if he/she have some genetic predisposition  such as hemophilia. Bleeding into the spinal canal can cause compression of the spinal  cord with subsequent paralysis.  This would require an emergency  surgery to  decompress and there are no guarantees that the patient would recover from the  paralysis. °      5. Pneumothorax:  Puncturing of a lung is a possibility, every time a needle is introduced in  the area of the chest or upper back.  Pneumothorax refers to free air around the  collapsed lung(s), inside of the thoracic cavity (chest cavity).  Another two possible  complications related to a similar event would include: Hemothorax and Chylothorax.   These are variations of the Pneumothorax, where instead of air around the collapsed  lung(s), you may have blood or chyle, respectively. °      6. Spinal headaches: They may occur with any procedures in the area of the spine. °      7. Persistent CSF (Cerebro-Spinal Fluid) leakage: This is a rare problem, but may occur  with prolonged intrathecal or epidural catheters either due to the formation of a fistulous  track or a dural tear. °      8. Nerve damage: By working so close to the spinal cord, there is always a possibility of  nerve damage, which could be as serious as a permanent spinal cord injury with  paralysis. °      9. Death:  Although rare, severe deadly allergic reactions known as "Anaphylactic  reaction" can occur to any of the medications used. °     10. Worsening of the symptoms:  We can always make thing worse. ° °What are the chances   of something like this happening? °Chances of any of this occuring are extremely low.  By statistics, you have more of a chance of getting killed in a motor vehicle accident: while driving to the hospital than any of the above occurring .  Nevertheless, you should be aware that they are possibilities.  In general, it is similar to taking a shower.  Everybody knows that you can slip, hit your head and get killed.  Does that mean that you should not shower again?  Nevertheless always keep in mind that statistics do not mean anything if you happen to be on the wrong side of them.  Even if a procedure has a 1 (one) in a  1,000,000 (million) chance of going wrong, it you happen to be that one..Also, keep in mind that by statistics, you have more of a chance of having something go wrong when taking medications. ° °Who should not have this procedure? °If you are on a blood thinning medication (e.g. Coumadin, Plavix, see list of "Blood Thinners"), or if you have an active infection going on, you should not have the procedure.  If you are taking any blood thinners, please inform your physician. ° °How should I prepare for this procedure? °Do not eat or drink anything at least six hours prior to the procedure. °Bring a driver with you .  It cannot be a taxi. °Come accompanied by an adult that can drive you back, and that is strong enough to help you if your legs get weak or numb from the local anesthetic. °Take all of your medicines the morning of the procedure with just enough water to swallow them. °If you have diabetes, make sure that you are scheduled to have your procedure done first thing in the morning, whenever possible. °If you have diabetes, take only half of your insulin dose and notify our nurse that you have done so as soon as you arrive at the clinic. °If you are diabetic, but only take blood sugar pills (oral hypoglycemic), then do not take them on the morning of your procedure.  You may take them after you have had the procedure. °Do not take aspirin or any aspirin-containing medications, at least eleven (11) days prior to the procedure.  They may prolong bleeding. °Wear loose fitting clothing that may be easy to take off and that you would not mind if it got stained with Betadine or blood. °Do not wear any jewelry or perfume °Remove any nail coloring.  It will interfere with some of our monitoring equipment. ° °NOTE: Remember that this is not meant to be interpreted as a complete list of all possible complications.  Unforeseen problems may occur. ° °BLOOD THINNERS °The following drugs contain aspirin or other products,  which can cause increased bleeding during surgery and should not be taken for 2 weeks prior to and 1 week after surgery.  If you should need take something for relief of minor pain, you may take acetaminophen which is found in Tylenol,m Datril, Anacin-3 and Panadol. It is not blood thinner. The products listed below are.  Do not take any of the products listed below in addition to any listed on your instruction sheet. ° °A.P.C or A.P.C with Codeine Codeine Phosphate Capsules #3 Ibuprofen Ridaura  °ABC compound Congesprin Imuran rimadil  °Advil Cope Indocin Robaxisal  °Alka-Seltzer Effervescent Pain Reliever and Antacid Coricidin or Coricidin-D ° Indomethacin Rufen  °Alka-Seltzer plus Cold Medicine Cosprin Ketoprofen S-A-C Tablets  °Anacin Analgesic Tablets or Capsules Coumadin   Korlgesic Salflex  Anacin Extra Strength Analgesic tablets or capsules CP-2 Tablets Lanoril Salicylate  Anaprox Cuprimine Capsules Levenox Salocol  Anexsia-D Dalteparin Magan Salsalate  Anodynos Darvon compound Magnesium Salicylate Sine-off  Ansaid Dasin Capsules Magsal Sodium Salicylate  Anturane Depen Capsules Marnal Soma  APF Arthritis pain formula Dewitt's Pills Measurin Stanback  Argesic Dia-Gesic Meclofenamic Sulfinpyrazone  Arthritis Bayer Timed Release Aspirin Diclofenac Meclomen Sulindac  Arthritis pain formula Anacin Dicumarol Medipren Supac  Analgesic (Safety coated) Arthralgen Diffunasal Mefanamic Suprofen  Arthritis Strength Bufferin Dihydrocodeine Mepro Compound Suprol  Arthropan liquid Dopirydamole Methcarbomol with Aspirin Synalgos  ASA tablets/Enseals Disalcid Micrainin Tagament  Ascriptin Doan's Midol Talwin  Ascriptin A/D Dolene Mobidin Tanderil  Ascriptin Extra Strength Dolobid Moblgesic Ticlid  Ascriptin with Codeine Doloprin or Doloprin with Codeine Momentum Tolectin  Asperbuf Duoprin Mono-gesic Trendar  Aspergum Duradyne Motrin or Motrin IB Triminicin  Aspirin plain, buffered or enteric coated  Durasal Myochrisine Trigesic  Aspirin Suppositories Easprin Nalfon Trillsate  Aspirin with Codeine Ecotrin Regular or Extra Strength Naprosyn Uracel  Atromid-S Efficin Naproxen Ursinus  Auranofin Capsules Elmiron Neocylate Vanquish  Axotal Emagrin Norgesic Verin  Azathioprine Empirin or Empirin with Codeine Normiflo Vitamin E  Azolid Emprazil Nuprin Voltaren  Bayer Aspirin plain, buffered or children's or timed BC Tablets or powders Encaprin Orgaran Warfarin Sodium  Buff-a-Comp Enoxaparin Orudis Zorpin  Buff-a-Comp with Codeine Equegesic Os-Cal-Gesic   Buffaprin Excedrin plain, buffered or Extra Strength Oxalid   Bufferin Arthritis Strength Feldene Oxphenbutazone   Bufferin plain or Extra Strength Feldene Capsules Oxycodone with Aspirin   Bufferin with Codeine Fenoprofen Fenoprofen Pabalate or Pabalate-SF   Buffets II Flogesic Panagesic   Buffinol plain or Extra Strength Florinal or Florinal with Codeine Panwarfarin   Buf-Tabs Flurbiprofen Penicillamine   Butalbital Compound Four-way cold tablets Penicillin   Butazolidin Fragmin Pepto-Bismol   Carbenicillin Geminisyn Percodan   Carna Arthritis Reliever Geopen Persantine   Carprofen Gold's salt Persistin   Chloramphenicol Goody's Phenylbutazone   Chloromycetin Haltrain Piroxlcam   Clmetidine heparin Plaquenil   Cllnoril Hyco-pap Ponstel   Clofibrate Hydroxy chloroquine Propoxyphen         Before stopping any of these medications, be sure to consult the physician who ordered them.  Some, such as Coumadin (Warfarin) are ordered to prevent or treat serious conditions such as "deep thrombosis", "pumonary embolisms", and other heart problems.  The amount of time that you may need off of the medication may also vary with the medication and the reason for which you were taking it.  If you are taking any of these medications, please make sure you notify your pain physician before you undergo any procedures.         Moderate Conscious  Sedation, Adult Sedation is the use of medicines to help you relax and not feel pain. Moderate conscious sedation is a type of sedation that makes you less alert than normal. You are still able to respond to instructions, touch, or both. This type of sedation is used during short medical and dental procedures. It is milder than deep sedation, which is a type of sedation you cannot be easily woken up from. It is also milder than general anesthesia, which is the use of medicines to make you fall asleep. Moderate conscious sedation lets you return to your normal activities sooner. Tell a health care provider about: Any allergies you have. All medicines you are taking, including vitamins, herbs, steroids, eye drops, creams, and over-the-counter medicines. Any problems you or family members have had with anesthesia.  Any bleeding problems you have. Any surgeries you have had. Any medical conditions you have. Whether you are pregnant or may be pregnant. Any recent alcohol, tobacco, or drug use. What are the risks? Your health care provider will talk with you about risks. These may include: Oversedation. This is when you get too much medicine. Nausea or vomiting. Allergic reaction to medicines. Trouble breathing. If this happens, a breathing tube may be used. It will be removed when you can breathe better on your own. Heart trouble. Lung trouble. Emergence delirium. This is when you feel confused while the sedation wears off. This gets better with time. What happens before the procedure? When to stop eating and drinking Follow instructions from your health care provider about what you may eat and drink. These may include: 8 hours before your procedure Stop eating most foods. Do not eat meat, fried foods, or fatty foods. Eat only light foods, such as toast or crackers. All liquids are okay except energy drinks and alcohol. 6 hours before your procedure Stop eating. Drink only clear liquids, such  as water, clear fruit juice, black coffee, plain tea, and sports drinks. Do not drink energy drinks or alcohol. 2 hours before your procedure Stop drinking all liquids. You may be allowed to take medicines with small sips of water. If you do not follow your health care provider's instructions, your procedure may be delayed or canceled. Medicines Ask your health care provider about: Changing or stopping your regular medicines. These include any diabetes medicines or blood thinners you take. Taking medicines such as aspirin and ibuprofen. These medicines can thin your blood. Do not take them unless your health care provider tells you to. Taking over-the-counter medicines, vitamins, herbs, and supplements. Tests and exams You may have an exam or testing. You may have a blood or urine sample taken. General instructions Do not use any products that contain nicotine or tobacco for at least 4 weeks before the procedure. These products include cigarettes, chewing tobacco, and vaping devices, such as e-cigarettes. If you need help quitting, ask your health care provider. If you will be going home right after the procedure, plan to have a responsible adult: Take you home from the hospital or clinic. You will not be allowed to drive. Care for you for the time you are told. What happens during the procedure?  You will be given the sedative. It may be given: As a pill you can take by mouth. It can also be put into the rectum. As a spray through the nose. As an injection into muscle. As an injection into a vein through an IV. You may be given oxygen as needed. Your blood pressure, heart rate, breathing rate, and blood oxygen level will be monitored during the procedure. The medical or dental procedure will be done. The procedure may vary among health care providers and hospitals. What happens after the procedure? Your blood pressure, heart rate, breathing rate, and blood oxygen level will be  monitored until you leave the hospital or clinic. You will get fluids through an IV as needed. Do not drive or operate machinery until your health care provider says that it is safe. This information is not intended to replace advice given to you by your health care provider. Make sure you discuss any questions you have with your health care provider. Document Revised: 03/12/2022 Document Reviewed: 03/12/2022 Elsevier Patient Education  [place]. Trigger Point Injection Trigger points are areas where you have pain. A trigger point  injection is a shot given in the trigger point to help relieve pain for a few days to a few months. Common places for trigger points include the neck, shoulders, upper back, or lower back. A trigger point injection will not cure long-term (chronic) pain permanently. These injections do not always work for every person. For some people, they can help to relieve pain for a few days to a few months. Tell a health care provider about: Any allergies you have. All medicines you are taking, including vitamins, herbs, eye drops, creams, and over-the-counter medicines. Any problems you or family members have had with anesthetic medicines. Any bleeding problems you have. Any surgeries you have had. Any medical conditions you have. Whether you are pregnant or may be pregnant. What are the risks? Generally, this is a safe procedure. However, problems may occur, including: Infection. Bleeding or bruising. Allergic reaction to the injected medicine. Irritation of the skin around the injection site. What happens before the procedure? Ask your health care provider about: Changing or stopping your regular medicines. This is especially important if you are taking diabetes medicines or blood thinners. Taking medicines such as aspirin and ibuprofen. These medicines can thin your blood. Do not take these medicines unless your health care provider tells you to take  them. Taking over-the-counter medicines, vitamins, herbs, and supplements. What happens during the procedure?  Your health care provider will feel for trigger points. A marker may be used to circle the area for the injection. The skin over the trigger point will be washed with a germ-killing soap. You may be given a medicine to help you relax (sedative). A thin needle is used for the injection. You may feel pain or a twitching feeling when the needle enters your skin. A numbing solution may be injected into the trigger point. Sometimes a medicine to keep down inflammation is also injected. Your health care provider may move the needle around the area where the trigger point is located until the tightness and twitching goes away. After the injection, your health care provider may put gentle pressure over the injection site. The injection site will be covered with a bandage (dressing). The procedure may vary among health care providers and hospitals. What can I expect after treatment? After treatment, you may have soreness and stiffness for 1-2 days. Follow these instructions at home: Injection site care Remove your dressing in a few hours, or as told by your health care provider. Check your injection site every day for signs of infection. Check for: Redness, swelling, or pain. Fluid or blood. Warmth. Pus or a bad smell. Managing pain, stiffness, and swelling If directed, put ice on the affected area. To do this: Put ice in a plastic bag. Place a towel between your skin and the bag. Leave the ice on for 20 minutes, 2-3 times a day. Remove the ice if your skin turns bright red. This is very important. If you cannot feel pain, heat, or cold, you have a greater risk of damage to the area. Activity If you were given a sedative during the procedure, it can affect you for several hours. Do not drive or operate machinery until your health care provider says that it is safe. Do not take baths,  swim, or use a hot tub until your health care provider approves. Return to your normal activities as told by your health care provider. Ask your health care provider what activities are safe for you. General instructions If you were asked to stop  your regular medicines, ask your health care provider when you may start taking them again. You may be asked to see an occupational or physical therapist for exercises that reduce muscle strain and stretch the area of the trigger point. Keep all follow-up visits. This is important. Contact a health care provider if: Your pain comes back, and it is worse than before the injection. You may need more injections. You have chills or a fever. The injection site becomes more painful, red, swollen, or warm to the touch. Summary A trigger point injection is a shot given in the trigger point to help relieve pain. Common places for trigger point injections are the neck, shoulders, upper back, and lower back. These injections do not always work for every person, but for some people, the injections can help to relieve pain for a few days to a few months. Contact a health care provider if symptoms come back or if they are worse than before treatment. Also, get help if the injection site becomes more painful, red, swollen, or warm to the touch. This information is not intended to replace advice given to you by your health care provider. Make sure you discuss any questions you have with your health care provider. Document Revised: 12/06/2020 Document Reviewed: 12/06/2020 Elsevier Patient Education  [place]. Facet Blocks Patient Information  Description: The facets are joints in the spine between the vertebrae.  Like any joints in the body, facets can become irritated and painful.  Arthritis can also effect the facets.  By injecting steroids and local anesthetic in and around these joints, we can temporarily block the nerve supply to them.  Steroids act  directly on irritated nerves and tissues to reduce selling and inflammation which often leads to decreased pain.  Facet blocks may be done anywhere along the spine from the neck to the low back depending upon the location of your pain.   After numbing the skin with local anesthetic (like Novocaine), a small needle is passed onto the facet joints under x-ray guidance.  You may experience a sensation of pressure while this is being done.  The entire block usually lasts about 15-25 minutes.   Conditions which may be treated by facet blocks:  Low back/buttock pain Neck/shoulder pain Certain types of headaches  Preparation for the injection:  Do not eat any solid food or dairy products within 8 hours of your appointment. You may drink clear liquid up to 3 hours before appointment.  Clear liquids include water, black coffee, juice or soda.  No milk or cream please. You may take your regular medication, including pain medications, with a sip of water before your appointment.  Diabetics should hold regular insulin (if taken separately) and take 1/2 normal NPH dose the morning of the procedure.  Carry some sugar containing items with you to your appointment. A driver must accompany you and be prepared to drive you home after your procedure. Bring all your current medications with you. An IV may be inserted and sedation may be given at the discretion of the physician. A blood pressure cuff, EKG and other monitors will often be applied during the procedure.  Some patients may need to have extra oxygen administered for a short period. You will be asked to provide medical information, including your allergies and medications, prior to the procedure.  We must know immediately if you are taking blood thinners (like Coumadin/Warfarin) or if you are allergic to IV iodine contrast (dye).  We must know if  you could possible be pregnant.  Possible side-effects:  Bleeding from needle site Infection (rare, may  require surgery) Nerve injury (rare) Numbness & tingling (temporary) Difficulty urinating (rare, temporary) Spinal headache (a headache worse with upright posture) Light-headedness (temporary) Pain at injection site (serveral days) Decreased blood pressure (rare, temporary) Weakness in arm/leg (temporary) Pressure sensation in back/neck (temporary)   Call if you experience:  Fever/chills associated with headache or increased back/neck pain Headache worsened by an upright position New onset, weakness or numbness of an extremity below the injection site Hives or difficulty breathing (go to the emergency room) Inflammation or drainage at the injection site(s) Severe back/neck pain greater than usual New symptoms which are concerning to you  Please note:  Although the local anesthetic injected can often make your back or neck feel good for several hours after the injection, the pain will likely return. It takes 3-7 days for steroids to work.  You may not notice any pain relief for at least one week.  If effective, we will often do a series of 2-3 injections spaced 3-6 weeks apart to maximally decrease your pain.  After the initial series, you may be a candidate for a more permanent nerve block of the facets.  If you have any questions, please call #[phone]) [facility]

## 2022-11-15 NOTE — Telephone Encounter (Signed)
Patient notified  that It was OK per Dr Holley Raring to have the procedure.

## 2022-11-15 NOTE — Telephone Encounter (Signed)
He is scheduled for facets on 3/20. His wife called back and said they forgot to tell Dr. Holley Raring he has a defibrillator.. They want a nurse to call them to discuss.

## 2022-11-15 NOTE — Progress Notes (Signed)
Safety precautions to be maintained throughout the outpatient stay will include: orient to surroundings, keep bed in low position, maintain call bell within reach at all times, provide assistance with transfer out of bed and ambulation.  

## 2022-11-22 DIAGNOSIS — Z22322 Carrier or suspected carrier of Methicillin resistant Staphylococcus aureus: Secondary | ICD-10-CM

## 2022-11-22 HISTORY — DX: Carrier or suspected carrier of methicillin resistant Staphylococcus aureus: Z22.322

## 2022-11-28 ENCOUNTER — Ambulatory Visit
Admission: RE | Admit: 2022-11-28 | Discharge: 2022-11-28 | Disposition: A | Discharge status: Home / Self Care | Payer: Medicare Other | Source: Ambulatory Visit | Attending: Student in an Organized Health Care Education/Training Program | Admitting: Student in an Organized Health Care Education/Training Program

## 2022-11-28 ENCOUNTER — Ambulatory Visit
Payer: No Typology Code available for payment source | Attending: Student in an Organized Health Care Education/Training Program | Admitting: Student in an Organized Health Care Education/Training Program

## 2022-11-28 VITALS — BP 123/58 | HR 78 | Temp 97.6°F | Resp 16 | Ht 69.0 in | Wt 167.0 lb

## 2022-11-28 DIAGNOSIS — M47816 Spondylosis without myelopathy or radiculopathy, lumbar region: Secondary | ICD-10-CM | POA: Diagnosis not present

## 2022-11-28 DIAGNOSIS — G894 Chronic pain syndrome: Secondary | ICD-10-CM | POA: Insufficient documentation

## 2022-11-28 DIAGNOSIS — M533 Sacrococcygeal disorders, not elsewhere classified: Secondary | ICD-10-CM | POA: Insufficient documentation

## 2022-11-28 DIAGNOSIS — G8929 Other chronic pain: Secondary | ICD-10-CM | POA: Diagnosis present

## 2022-11-28 DIAGNOSIS — M47818 Spondylosis without myelopathy or radiculopathy, sacral and sacrococcygeal region: Secondary | ICD-10-CM | POA: Diagnosis present

## 2022-11-28 MED ORDER — ROPIVACAINE HCL 2 MG/ML IJ SOLN
9.0000 mL | Freq: Once | INTRAMUSCULAR | Status: AC
  Administered 2022-11-28: 9 mL via PERINEURAL
  Filled 2022-11-28: qty 20

## 2022-11-28 MED ORDER — METHYLPREDNISOLONE ACETATE 40 MG/ML IJ SUSP
40.0000 mg | Freq: Once | INTRAMUSCULAR | Status: AC
  Administered 2022-11-28: 40 mg via INTRA_ARTICULAR
  Filled 2022-11-28: qty 1

## 2022-11-28 MED ORDER — IOHEXOL 180 MG/ML  SOLN
10.0000 mL | Freq: Once | INTRAMUSCULAR | Status: AC
  Administered 2022-11-28: 10 mL via INTRA_ARTICULAR
  Filled 2022-11-28: qty 20

## 2022-11-28 MED ORDER — ROPIVACAINE HCL 2 MG/ML IJ SOLN
9.0000 mL | Freq: Once | INTRAMUSCULAR | Status: AC
  Administered 2022-11-28: 9 mL via INTRA_ARTICULAR
  Filled 2022-11-28: qty 20

## 2022-11-28 MED ORDER — DEXAMETHASONE SODIUM PHOSPHATE 10 MG/ML IJ SOLN
10.0000 mg | Freq: Once | INTRAMUSCULAR | Status: AC
  Administered 2022-11-28: 10 mg
  Filled 2022-11-28: qty 1

## 2022-11-28 MED ORDER — LIDOCAINE HCL 2 % IJ SOLN
20.0000 mL | Freq: Once | INTRAMUSCULAR | Status: AC
  Administered 2022-11-28: 400 mg

## 2022-11-28 NOTE — Patient Instructions (Signed)
Pain Management Discharge Instructions  General Discharge Instructions :  If you need to reach your doctor call: Monday-Friday 8:00 am - 4:00 pm at [phone] or toll free [phone].  After clinic hours [phone] to have operator reach doctor.  Bring all of your medication bottles to all your appointments in the pain clinic.  To cancel or reschedule your appointment with Pain Management please remember to call 24 hours in advance to avoid a fee.  Refer to the educational materials which you have been given on: General Risks, I had my Procedure. Discharge Instructions, Post Sedation.  Post Procedure Instructions:  The drugs you were given will stay in your system until tomorrow, so for the next 24 hours you should not drive, make any legal decisions or drink any alcoholic beverages.  You may eat anything you prefer, but it is better to start with liquids then soups and crackers, and gradually work up to solid foods.  Please notify your doctor immediately if you have any unusual bleeding, trouble breathing or pain that is not related to your normal pain.  Depending on the type of procedure that was done, some parts of your body may feel week and/or numb.  This usually clears up by tonight or the next day.  Walk with the use of an assistive device or accompanied by an adult for the 24 hours.  You may use ice on the affected area for the first 24 hours.  Put ice in a Ziploc bag and cover with a towel and place against area 15 minutes on 15 minutes off.  You may switch to heat after 24 hours.Facet Blocks Patient Information  Description: The facets are joints in the spine between the vertebrae.  Like any joints in the body, facets can become irritated and painful.  Arthritis can also effect the facets.  By injecting steroids and local anesthetic in and around these joints, we can temporarily block the nerve supply to them.  Steroids act directly on irritated nerves and tissues to  reduce selling and inflammation which often leads to decreased pain.  Facet blocks may be done anywhere along the spine from the neck to the low back depending upon the location of your pain.   After numbing the skin with local anesthetic (like Novocaine), a small needle is passed onto the facet joints under x-ray guidance.  You may experience a sensation of pressure while this is being done.  The entire block usually lasts about 15-25 minutes.   Conditions which may be treated by facet blocks:  Low back/buttock pain Neck/shoulder pain Certain types of headaches  Preparation for the injection:  Do not eat any solid food or dairy products within 8 hours of your appointment. You may drink clear liquid up to 3 hours before appointment.  Clear liquids include water, black coffee, juice or soda.  No milk or cream please. You may take your regular medication, including pain medications, with a sip of water before your appointment.  Diabetics should hold regular insulin (if taken separately) and take 1/2 normal NPH dose the morning of the procedure.  Carry some sugar containing items with you to your appointment. A driver must accompany you and be prepared to drive you home after your procedure. Bring all your current medications with you. An IV may be inserted and sedation may be given at the discretion of the physician. A blood pressure cuff, EKG and other monitors will often be applied during the procedure.  Some patients may need to   have extra oxygen administered for a short period. You will be asked to provide medical information, including your allergies and medications, prior to the procedure.  We must know immediately if you are taking blood thinners (like Coumadin/Warfarin) or if you are allergic to IV iodine contrast (dye).  We must know if you could possible be pregnant.  Possible side-effects:  Bleeding from needle site Infection (rare, may require surgery) Nerve injury (rare) Numbness  & tingling (temporary) Difficulty urinating (rare, temporary) Spinal headache (a headache worse with upright posture) Light-headedness (temporary) Pain at injection site (serveral days) Decreased blood pressure (rare, temporary) Weakness in arm/leg (temporary) Pressure sensation in back/neck (temporary)   Call if you experience:  Fever/chills associated with headache or increased back/neck pain Headache worsened by an upright position New onset, weakness or numbness of an extremity below the injection site Hives or difficulty breathing (go to the emergency room) Inflammation or drainage at the injection site(s) Severe back/neck pain greater than usual New symptoms which are concerning to you  Please note:  Although the local anesthetic injected can often make your back or neck feel good for several hours after the injection, the pain will likely return. It takes 3-7 days for steroids to work.  You may not notice any pain relief for at least one week.  If effective, we will often do a series of 2-3 injections spaced 3-6 weeks apart to maximally decrease your pain.  After the initial series, you may be a candidate for a more permanent nerve block of the facets.  If you have any questions, please call [phone] [facility] 

## 2022-11-28 NOTE — Progress Notes (Signed)
Safety precautions to be maintained throughout the outpatient stay will include: orient to surroundings, keep bed in low position, maintain call bell within reach at all times, provide assistance with transfer out of bed and ambulation.  

## 2022-11-28 NOTE — Progress Notes (Signed)
PROVIDER NOTE: Interpretation of information contained herein should be left to medically-trained personnel. Specific patient instructions are provided elsewhere under "Patient Instructions" section of medical record. This document was created in part using STT-dictation technology, any transcriptional errors that may result from this process are unintentional.  Patient: Danny Marquez Type: Established DOB: [birth year] MRN: [id number] PCP: [facility]  Service: Procedure DOS: 11/28/2022 Setting: Ambulatory Location: Ambulatory outpatient facility Delivery: Face-to-face Provider: Gillis Santa, MD Specialty: Interventional Pain Management Specialty designation: 09 Location: Outpatient facility Ref. Prov.: [facility]       Interventional Therapy   Procedure: Lumbar Facet, Medial Branch Block(s) #1 and right sacral trigger point injection Laterality: Right  Level: L2, L3, and L4 Medial Branch Level(s). Injecting these levels blocks the L2-3 and L3-4 lumbar facet joints. Imaging: Fluoroscopic guidance         Anesthesia: Local anesthesia (1-2% Lidocaine) DOS: 11/28/2022 Performed by: Gillis Santa, MD  Primary Purpose: Diagnostic/Therapeutic Indications: Low back pain severe enough to impact quality of life or function. 1. Lumbar facet arthropathy   2. Lumbar spondylosis   3. Chronic right SI joint pain   4. Chronic pain syndrome   5. SI joint arthritis    NAS-11 Pain score:   Pre-procedure: 7 /10   Post-procedure: 7 /10     Position / Prep / Materials:  Position: Prone  Prep solution: DuraPrep (Iodine Povacrylex [0.7% available iodine] and Isopropyl Alcohol, 74% w/w) Area Prepped: Posterolateral Lumbosacral Spine (Wide prep: From the lower border of the scapula down to the end of the tailbone and from flank to flank.)  Materials:  Tray: Block Needle(s):  Type: Spinal  Gauge (G): 22  Length: 3.5-in Qty: 3      Pre-op H&P Assessment:  Danny Marquez is a 77  y.o. (year old), male patient, seen today for interventional treatment. He  has a past surgical history that includes Coronary artery bypass graft; Back surgery; CARDIOVERSION (N/A, 01/23/2017); CARDIOVERSION (N/A, 02/01/2017); Joint replacement; Colonoscopy with propofol (N/A, 03/05/2019); and Esophagogastroduodenoscopy (egd) with propofol (N/A, 03/05/2019). Danny Marquez has a current medication list which includes the following prescription(s): allopurinol, AMBULATORY NON FORMULARY MEDICATION, amiodarone, aspirin ec, buprenorphine, [START ON 01/02/2023] buprenorphine, donepezil hcl, empagliflozin, losartan, metoprolol tartrate, oxycodone, pantoprazole, pregabalin, ranolazine, rosuvastatin, sertraline, torsemide, and trazodone hcl. His primarily concern today is the Back Pain (lower)  Initial Vital Signs:  Pulse/HCG Rate: 78ECG Heart Rate: 60 Temp: 97.6 F (36.4 C) Resp: 16 BP: 99/66 SpO2: 100 %  BMI: Estimated body mass index is 24.66 kg/m as calculated from the following:   Height as of this encounter: 5\' 9"  (1.753 m).   Weight as of this encounter: 167 lb (75.8 kg).  Risk Assessment: Allergies: Reviewed. He is allergic to codeine.  Allergy Precautions: None required Coagulopathies: Reviewed. None identified.  Blood-thinner therapy: None at this time Active Infection(s): Reviewed. None identified. Danny Marquez is afebrile  Site Confirmation: Danny Marquez was asked to confirm the procedure and laterality before marking the site Procedure checklist: Completed Consent: Before the procedure and under the influence of no sedative(s), amnesic(s), or anxiolytics, the patient was informed of the treatment options, risks and possible complications. To fulfill our ethical and legal obligations, as recommended by the American Medical Association's Code of Ethics, I have informed the patient of my clinical impression; the nature and purpose of the treatment or procedure; the risks, benefits, and possible  complications of the intervention; the alternatives, including doing nothing; the risk(s) and benefit(s) of the alternative  treatment(s) or procedure(s); and the risk(s) and benefit(s) of doing nothing. The patient was provided information about the general risks and possible complications associated with the procedure. These may include, but are not limited to: failure to achieve desired goals, infection, bleeding, organ or nerve damage, allergic reactions, paralysis, and death. In addition, the patient was informed of those risks and complications associated to Spine-related procedures, such as failure to decrease pain; infection (i.e.: Meningitis, epidural or intraspinal abscess); bleeding (i.e.: epidural hematoma, subarachnoid hemorrhage, or any other type of intraspinal or peri-dural bleeding); organ or nerve damage (i.e.: Any type of peripheral nerve, nerve root, or spinal cord injury) with subsequent damage to sensory, motor, and/or autonomic systems, resulting in permanent pain, numbness, and/or weakness of one or several areas of the body; allergic reactions; (i.e.: anaphylactic reaction); and/or death. Furthermore, the patient was informed of those risks and complications associated with the medications. These include, but are not limited to: allergic reactions (i.e.: anaphylactic or anaphylactoid reaction(s)); adrenal axis suppression; blood sugar elevation that in diabetics may result in ketoacidosis or comma; water retention that in patients with history of congestive heart failure may result in shortness of breath, pulmonary edema, and decompensation with resultant heart failure; weight gain; swelling or edema; medication-induced neural toxicity; particulate matter embolism and blood vessel occlusion with resultant organ, and/or nervous system infarction; and/or aseptic necrosis of one or more joints. Finally, the patient was informed that Medicine is not an exact science; therefore, there is also  the possibility of unforeseen or unpredictable risks and/or possible complications that may result in a catastrophic outcome. The patient indicated having understood very clearly. We have given the patient no guarantees and we have made no promises. Enough time was given to the patient to ask questions, all of which were answered to the patient's satisfaction. Mr. Gorka has indicated that he wanted to continue with the procedure. Attestation: I, the ordering provider, attest that I have discussed with the patient the benefits, risks, side-effects, alternatives, likelihood of achieving goals, and potential problems during recovery for the procedure that I have provided informed consent. Date  Time: 11/28/2022  1:41 PM   Pre-Procedure Preparation:  Monitoring: As per clinic protocol. Respiration, ETCO2, SpO2, BP, heart rate and rhythm monitor placed and checked for adequate function Safety Precautions: Patient was assessed for positional comfort and pressure points before starting the procedure. Time-out: I initiated and conducted the "Time-out" before starting the procedure, as per protocol. The patient was asked to participate by confirming the accuracy of the "Time Out" information. Verification of the correct person, site, and procedure were performed and confirmed by me, the nursing staff, and the patient. "Time-out" conducted as per Joint Commission's Universal Protocol (UP.01.01.01). Time: 1455  Description of Procedure:          Laterality: (see above) Targeted Levels: (see above)  Safety Precautions: Aspiration looking for blood return was conducted prior to all injections. At no point did we inject any substances, as a needle was being advanced. Before injecting, the patient was told to immediately notify me if he was experiencing any new onset of "ringing in the ears, or metallic taste in the mouth". No attempts were made at seeking any paresthesias. Safe injection practices and needle  disposal techniques used. Medications properly checked for expiration dates. SDV (single dose vial) medications used. After the completion of the procedure, all disposable equipment used was discarded in the proper designated medical waste containers. Local Anesthesia: Protocol guidelines were followed. The patient was  positioned over the fluoroscopy table. The area was prepped in the usual manner. The time-out was completed. The target area was identified using fluoroscopy. A 12-in long, straight, sterile hemostat was used with fluoroscopic guidance to locate the targets for each level blocked. Once located, the skin was marked with an approved surgical skin marker. Once all sites were marked, the skin (epidermis, dermis, and hypodermis), as well as deeper tissues (fat, connective tissue and muscle) were infiltrated with a small amount of a short-acting local anesthetic, loaded on a 10cc syringe with a 25G, 1.5-in  Needle. An appropriate amount of time was allowed for local anesthetics to take effect before proceeding to the next step. Local Anesthetic: Lidocaine 2.0% The unused portion of the local anesthetic was discarded in the proper designated containers. Technical description of process:  L2 Medial Branch Nerve Block (MBB): The target area for the L2 medial branch is at the junction of the postero-lateral aspect of the superior articular process and the superior, posterior, and medial edge of the transverse process of L3. Under fluoroscopic guidance, a Quincke needle was inserted until contact was made with os over the superior postero-lateral aspect of the pedicular shadow (target area). After negative aspiration for blood, 46mL of the nerve block solution was injected without difficulty or complication. The needle was removed intact. L3 Medial Branch Nerve Block (MBB): The target area for the L3 medial branch is at the junction of the postero-lateral aspect of the superior articular process and the  superior, posterior, and medial edge of the transverse process of L4. Under fluoroscopic guidance, a Quincke needle was inserted until contact was made with os over the superior postero-lateral aspect of the pedicular shadow (target area). After negative aspiration for blood, 64mL of the nerve block solution was injected without difficulty or complication. The needle was removed intact. L4 Medial Branch Nerve Block (MBB): The target area for the L4 medial branch is at the junction of the postero-lateral aspect of the superior articular process and the superior, posterior, and medial edge of the transverse process of L5. Under fluoroscopic guidance, a Quincke needle was inserted until contact was made with os over the superior postero-lateral aspect of the pedicular shadow (target area). After negative aspiration for blood, 2 mL of the nerve block solution was injected without difficulty or complication. The needle was removed intact.  Nerve block solution: 5 cc of 0.2% ropivacaine, 1 cc of Decadron 10 mg/cc.  2 cc injected at each level above on the right  Afterwards a right sacral trigger point injection was done with 2 cc solution consisting of 1 cc of 0.2% ropivacaine, 1 cc of methylprednisolone, 40 mg/cc.  Once the entire procedure was completed, the treated area was cleaned, making sure to leave some of the prepping solution back to take advantage of its long term bactericidal properties.         Illustration of the posterior view of the lumbar spine and the posterior neural structures. Laminae of L2 through S1 are labeled. DPRL5, dorsal primary ramus of L5; DPRS1, dorsal primary ramus of S1; DPR3, dorsal primary ramus of L3; FJ, facet (zygapophyseal) joint L3-L4; I, inferior articular process of L4; LB1, lateral branch of dorsal primary ramus of L1; IAB, inferior articular branches from L3 medial branch (supplies L4-L5 facet joint); IBP, intermediate branch plexus; MB3, medial branch of dorsal  primary ramus of L3; NR3, third lumbar nerve root; S, superior articular process of L5; SAB, superior articular branches from L4 (supplies L4-5 facet  joint also); TP3, transverse process of L3.  Vitals:   11/28/22 1347 11/28/22 1454 11/28/22 1459 11/28/22 1501  BP: 99/66 (!) 123/58 129/79 (!) 123/58  Pulse: 78     Resp: 16 18 15 16   Temp: 97.6 F (36.4 C)     SpO2: 100% 97% 99% 98%  Weight: 167 lb (75.8 kg)     Height: 5\' 9"  (1.753 m)        Start Time: 1455 hrs. End Time: 1500 hrs.  Imaging Guidance (Spinal):          Type of Imaging Technique: Fluoroscopy Guidance (Spinal) Indication(s): Assistance in needle guidance and placement for procedures requiring needle placement in or near specific anatomical locations not easily accessible without such assistance. Exposure Time: Please see nurses notes. Contrast: None used. Fluoroscopic Guidance: I was personally present during the use of fluoroscopy. "Tunnel Vision Technique" used to obtain the best possible view of the target area. Parallax error corrected before commencing the procedure. "Direction-depth-direction" technique used to introduce the needle under continuous pulsed fluoroscopy. Once target was reached, antero-posterior, oblique, and lateral fluoroscopic projection used confirm needle placement in all planes. Images permanently stored in EMR. Interpretation: No contrast injected. I personally interpreted the imaging intraoperatively. Adequate needle placement confirmed in multiple planes. Permanent images saved into the patient's record.  Post-operative Assessment:  Post-procedure Vital Signs:  Pulse/HCG Rate: 7860 Temp: 97.6 F (36.4 C) Resp: 16 BP: (!) 123/58 SpO2: 98 %  EBL: None  Complications: No immediate post-treatment complications observed by team, or reported by patient.  Note: The patient tolerated the entire procedure well. A repeat set of vitals were taken after the procedure and the patient was kept under  observation following institutional policy, for this type of procedure. Post-procedural neurological assessment was performed, showing return to baseline, prior to discharge. The patient was provided with post-procedure discharge instructions, including a section on how to identify potential problems. Should any problems arise concerning this procedure, the patient was given instructions to immediately contact us, at any time, without hesitation. In any case, we plan to contact the patient by telephone for a follow-up status report regarding this interventional procedure.  Comments:  No additional relevant information.  Plan of Care (POC)  Orders:  Orders Placed This Encounter  Procedures   DG PAIN CLIN[facility] C-ARM 1-60 MIN NO REPORT    Intraoperative interpretation by procedural physician at [facility].    Standing Status:   Standing    Number of Occurrences:   1    Order Specific Question:   Reason for exam:    Answer:   Assistance in needle guidance and placement for procedures requiring needle placement in or near specific anatomical locations not easily accessible without such assistance.     Medications ordered for procedure: Meds ordered this encounter  Medications   iohexol (OMNIPAQUE) 180 MG/ML injection 10 mL    Must be Myelogram-compatible. If not available, you may substitute with a water-soluble, non-ionic, hypoallergenic, myelogram-compatible radiological contrast medium.   lidocaine (XYLOCAINE) 2 % (with pres) injection 400 mg   dexamethasone (DECADRON) injection 10 mg   ropivacaine (PF) 2 mg/mL (0.2%) (NAROPIN) injection 9 mL   methylPREDNISolone acetate (DEPO-MEDROL) injection 40 mg   ropivacaine (PF) 2 mg/mL (0.2%) (NAROPIN) injection 9 mL   Medications administered: We administered iohexol, lidocaine, dexamethasone, ropivacaine (PF) 2 mg/mL (0.2%), methylPREDNISolone acetate, and ropivacaine (PF) 2 mg/mL (0.2%).  See the medical record for exact dosing, route,  and time of administration.  Follow-up plan:   Return in about 4 weeks (around 12/26/2022) for Medication Management, Post Procedure Evaluation, in person.       Recent Visits Date Type Provider Dept  11/15/22 Office Visit Gillis Santa, MD [facility]  09/27/22 Office Visit Gillis Santa, MD [facility]  Showing recent visits within past 90 days and meeting all other requirements Today's Visits Date Type Provider Dept  11/28/22 Procedure visit Gillis Santa, MD [facility]  Showing today's visits and meeting all other requirements Future Appointments Date Type Provider Dept  12/25/22 Appointment Gillis Santa, MD [facility]  Showing future appointments within next 90 days and meeting all other requirements  Disposition: Discharge home  Discharge (Date  Time): 11/28/2022; 1510 hrs.   Primary Care Physician: [place] Location: [facility] Note by: Gillis Santa, MD (TTS technology used. I apologize for any typographical errors that were not detected and corrected.) Date: 11/28/2022; Time: 4:29 PM  Disclaimer:  Medicine is not an Chief Strategy Officer. The only guarantee in medicine is that nothing is guaranteed. It is important to note that the decision to proceed with this intervention was based on the information collected from the patient. The Data and conclusions were drawn from the patient's questionnaire, the interview, and the physical examination. Because the information was provided in large part by the patient, it cannot be guaranteed that it has not been purposely or unconsciously manipulated. Every effort has been made to obtain as much relevant data as possible for this evaluation. It is important to note that the conclusions that lead to this procedure are derived in large part from the available data. Always take into account that the treatment will also be dependent on availability of resources and  existing treatment guidelines, considered by other Pain Management Practitioners as being common knowledge and practice, at the time of the intervention. For Medico-Legal purposes, it is also important to point out that variation in procedural techniques and pharmacological choices are the acceptable norm. The indications, contraindications, technique, and results of the above procedure should only be interpreted and judged by a Board-Certified Interventional Pain Specialist with extensive familiarity and expertise in the same exact procedure and technique.

## 2022-11-29 ENCOUNTER — Telehealth: Payer: Self-pay

## 2022-11-29 NOTE — Telephone Encounter (Signed)
Post procedure follow up.  Patients wife states he is doing good.  

## 2022-12-25 ENCOUNTER — Encounter: Payer: Self-pay | Admitting: Student in an Organized Health Care Education/Training Program

## 2022-12-25 ENCOUNTER — Ambulatory Visit
Payer: No Typology Code available for payment source | Attending: Student in an Organized Health Care Education/Training Program | Admitting: Student in an Organized Health Care Education/Training Program

## 2022-12-25 VITALS — BP 124/94 | HR 84 | Temp 97.5°F | Resp 20 | Ht 65.0 in | Wt 167.0 lb

## 2022-12-25 DIAGNOSIS — Z981 Arthrodesis status: Secondary | ICD-10-CM | POA: Diagnosis not present

## 2022-12-25 DIAGNOSIS — M47816 Spondylosis without myelopathy or radiculopathy, lumbar region: Secondary | ICD-10-CM

## 2022-12-25 DIAGNOSIS — G894 Chronic pain syndrome: Secondary | ICD-10-CM | POA: Diagnosis not present

## 2022-12-25 NOTE — Progress Notes (Signed)
PROVIDER NOTE: Information contained herein reflects review and annotations entered in association with encounter. Interpretation of such information and data should be left to medically-trained personnel. Information provided to patient can be located elsewhere in the medical record under "Patient Instructions". Document created using STT-dictation technology, any transcriptional errors that may result from process are unintentional.    Patient: Danny Marquez  Service Category: E/M  Provider: Edward Jolly, MD  DOB: September 06, 1946  DOS: 12/25/2022  Referring Provider: [facility]  MRN: [id number]  Specialty: Interventional Pain Management  [facility]P: [facility]  Type: Established Patient  Setting: Ambulatory outpatient    Location: Office  Delivery: Face-to-face     HPI  [name], a 77 y.o. year old male, is here today because of his Lumbar facet arthropathy [M47.816]. [name] primary complain today is Back Pain (R>L)  Pertinent problems: [name] has History of myocardial infarction; Spinal stenosis, lumbar region, with neurogenic claudication; History of lumbar fusion; Lumbar radiculopathy; S/P fusion of sacroiliac joint (right); Chronic pain syndrome; Chronic right SI joint pain; and Lumbar facet arthropathy on their pertinent problem list. Pain Assessment: Severity of Chronic pain is reported as a 8 /10. Location: Back Lower, Right/Denies. Onset: More than a month ago. Quality: Constant, Aching, Throbbing. Timing: Constant. Modifying factor(s): Procedures, medications, and rest. Vitals:  height is  (1.651 m) and weight is 167 lb (75.8 kg). His temporal temperature is 97.5 F (36.4 C) (abnormal). His blood pressure is 124/94 (abnormal) and his pulse is 84. His respiration is 20 and oxygen saturation is 90%.  BMI: Estimated body mass index is 27.79 kg/m as calculated from the following:   Height as of this encounter:  (1.651 m).   Weight as of this  encounter: 167 lb (75.8 kg). Last encounter: 11/15/2022. Last procedure: 11/28/2022.  Reason for encounter: post-procedure evaluation and assessment.    Post-procedure evaluation   Procedure: Lumbar Facet, Medial Branch Block(s) #1 and right sacral trigger point injection Laterality: Right  Level: L2, L3, and L4 Medial Branch Level(s). Injecting these levels blocks the L2-3 and L3-4 lumbar facet joints. Imaging: Fluoroscopic guidance         Anesthesia: Local anesthesia (1-2% Lidocaine) DOS: 11/28/2022 Performed by: Edward Jolly, MD  Primary Purpose: Diagnostic/Therapeutic Indications: Low back pain severe enough to impact quality of life or function. 1. Lumbar facet arthropathy   2. Lumbar spondylosis   3. Chronic right SI joint pain   4. Chronic pain syndrome   5. SI joint arthritis    NAS-11 Pain score:   Pre-procedure: 7 /10   Post-procedure: 7 /10      Effectiveness:  Initial hour after procedure: 100 %  Subsequent 4-6 hours post-procedure: 100 %  Analgesia past initial 6 hours: 20 % (100% relief for 2-3 weeks)  Ongoing improvement:  Analgesic:  20% Function: Back to baseline ROM: Back to baseline    ROS  Constitutional: Denies any fever or chills Gastrointestinal: No reported hemesis, hematochezia, vomiting, or acute GI distress Musculoskeletal:  right LBP Neurological: No reported episodes of acute onset apraxia, aphasia, dysarthria, agnosia, amnesia, paralysis, loss of coordination, or loss of consciousness  Medication Review  AMBULATORY NON FORMULARY MEDICATION, Donepezil HCl, allopurinol, amiodarone, aspirin EC, buprenorphine, empagliflozin, losartan, metoprolol tartrate, oxycodone, pantoprazole, pregabalin, ranolazine, rosuvastatin, sertraline, torsemide, and traZODone HCl  History Review  Allergy: [name] is allergic to codeine. Drug: [name]  reports that he does not currently use drugs. Alcohol:  reports that he  does not currently use alcohol after  a past usage of about 24.0 standard drinks of alcohol per week. Tobacco:  reports that he has quit smoking. His smoking use included cigarettes. He has never used smokeless tobacco. Social: [name]  reports that he has quit smoking. His smoking use included cigarettes. He has never used smokeless tobacco. He reports that he does not currently use alcohol after a past usage of about 24.0 standard drinks of alcohol per week. He reports that he does not currently use drugs. Medical:  has a past medical history of A-fib, CHF (congestive heart failure), Chronic kidney disease, Hypertension, Myocardial infarction, and Solitary kidney. Surgical: [name]  has a past surgical history that includes Coronary artery bypass graft; Back surgery; CARDIOVERSION (N/A, 01/23/2017); CARDIOVERSION (N/A, 02/01/2017); Joint replacement; Colonoscopy with propofol (N/A, 03/05/2019); and Esophagogastroduodenoscopy (egd) with propofol (N/A, 03/05/2019). Family: family history includes Heart attack in his father.  Laboratory Chemistry Profile   Renal Lab Results  Component Value Date   BUN 24 (H) 03/03/2022   CREATININE 1.45 (H) 03/03/2022   GFRAA >60 08/06/2019   GFRNONAA 50 (L) 03/03/2022    Hepatic Lab Results  Component Value Date   AST 45 (H) 03/03/2022   ALT 17 03/03/2022   ALBUMIN 3.1 (L) 03/03/2022   ALKPHOS 85 03/03/2022   LIPASE 356 (H) 08/06/2019   AMMONIA 29 08/06/2019    Electrolytes Lab Results  Component Value Date   NA 139 03/03/2022   K 4.3 03/03/2022   CL 110 03/03/2022   CALCIUM 9.8 03/03/2022   MG 2.1 05/20/2021    Bone No results found for: "VD25OH", "VD125OH2TOT", "ZO1096EA5", "WU9811BJ4", "25OHVITD1", "25OHVITD2", "25OHVITD3", "TESTOFREE", "TESTOSTERONE"  Inflammation (CRP: Acute Phase) (ESR: Chronic Phase) Lab Results  Component Value Date   LATICACIDVEN 1.1 08/06/2019         Note: Above Lab results reviewed.  Recent Imaging Review  DG PAIN CLINIC C-ARM 1-60 MIN NO  REPORT Fluoro was used, but no Radiologist interpretation will be provided.  Please refer to "NOTES" tab for provider progress note. Note: Reviewed        Physical Exam  General appearance: Well nourished, well developed, and well hydrated. In no apparent acute distress Mental status: Alert, oriented x 3 (person, place, & time)       Respiratory: No evidence of acute respiratory distress Eyes: PERLA Vitals: BP (!) 124/94   Pulse 84   Temp (!) 97.5 F (36.4 C) (Temporal)   Resp 20   Ht 5\' 5"  (1.651 m)   Wt 167 lb (75.8 kg)   SpO2 90%   BMI 27.79 kg/m  BMI: Estimated body mass index is 27.79 kg/m as calculated from the following:   Height as of this encounter: 5\' 5"  (1.651 m).   Weight as of this encounter: 167 lb (75.8 kg). Ideal: Ideal body weight: 61.5 kg (135 lb 9.3 oz) Adjusted ideal body weight: 67.2 kg (148 lb 2.4 oz)  Right low back pain, worse with facet loading, positive response after first set of diagnostic lumbar facet medial branch nerve blocks  Assessment   Diagnosis Status  1. Lumbar facet arthropathy   2. Lumbar spondylosis   3. History of lumbar fusion (L4-S1)   4. Chronic pain syndrome    Controlled Controlled Controlled   Updated Problems: Problem  Lumbar Facet Arthropathy    Plan of Care  1. Lumbar facet arthropathy - LUMBAR FACET(MEDIAL BRANCH NERVE BLOCK) MBNB; Future  2. Lumbar spondylosis - LUMBAR FACET(MEDIAL BRANCH  NERVE BLOCK) MBNB; Future  3. History of lumbar fusion (L4-S1)  4. Chronic pain syndrome - LUMBAR FACET(MEDIAL BRANCH NERVE BLOCK) MBNB; Future    Orders:  Orders Placed This Encounter  Procedures   LUMBAR FACET(MEDIAL BRANCH NERVE BLOCK) MBNB    Standing Status:   Future    Standing Expiration Date:   03/26/2023    Scheduling Instructions:     Procedure: Lumbar facet block (AKA.: Lumbosacral medial branch nerve block)     Side: RIGHT     Level: L1-2, L2-3, L3-4, Facets (L1, L2, L3, Medial Branch)     Sedation:  local only     Timeframe: ASAA    Order Specific Question:   Where will this procedure be performed?    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      Recent Visits Date Type Provider Dept  11/28/22 Procedure visit Edward Jolly, MD [facility]  11/15/22 Office Visit Edward Jolly, MD [facility]  09/27/22 Office Visit Edward Jolly, MD [facility]  Showing recent visits within past 90 days and meeting all other requirements Today's Visits Date Type Provider Dept  12/25/22 Office Visit Edward Jolly, MD [facility]  Showing today's visits and meeting all other requirements Future Appointments Date Type Provider Dept  01/16/23 Appointment Edward Jolly, MD [facility]  Showing future appointments within next 90 days and meeting all other requirements  I discussed the assessment and treatment plan with the patient. The patient was provided an opportunity to ask questions and all were answered. The patient agreed with the plan and demonstrated an understanding of the instructions.  Patient advised to call back or seek an in-person evaluation if the symptoms or condition worsens.  Duration of encounter: .  Total time on encounter, as per AMA guidelines included both the face-to-face and non-face-to-face time personally spent by the physician and/or other qualified health care professional(s) on the day of the encounter (includes time in activities that require the physician or other qualified health care professional and does not include time in activities  normally performed by clinical staff). Physician's time may include the following activities when performed: Preparing to see the patient (e.g., pre-charting review of records, searching for previously ordered imaging, lab work, and nerve conduction tests) Review of prior analgesic pharmacotherapies. Reviewing PMP Interpreting ordered tests (e.g., lab work, imaging, nerve conduction tests) Performing post-procedure evaluations, including interpretation of diagnostic procedures Obtaining and/or reviewing separately obtained history Performing a medically appropriate examination and/or evaluation Counseling and educating the patient/family/caregiver Ordering medications, tests, or procedures Referring and communicating with other health care professionals (when not separately reported) Documenting clinical information in the electronic or other health record Independently interpreting results (not separately reported) and communicating results to the patient/ family/caregiver Care coordination (not separately reported)  Note by: Edward Jolly, MD Date: 12/25/2022; Time: 3:56 PM

## 2022-12-25 NOTE — Progress Notes (Signed)
Safety precautions to be maintained throughout the outpatient stay will include: orient to surroundings, keep bed in low position, maintain call bell within reach at all times, provide assistance with transfer out of bed and ambulation.  

## 2022-12-25 NOTE — Patient Instructions (Signed)

## 2023-01-16 ENCOUNTER — Encounter: Payer: Self-pay | Admitting: Student in an Organized Health Care Education/Training Program

## 2023-01-16 ENCOUNTER — Ambulatory Visit
Admission: RE | Admit: 2023-01-16 | Discharge: 2023-01-16 | Disposition: A | Discharge status: Home / Self Care | Payer: Medicare Other | Source: Ambulatory Visit | Attending: Student in an Organized Health Care Education/Training Program | Admitting: Student in an Organized Health Care Education/Training Program

## 2023-01-16 ENCOUNTER — Ambulatory Visit
Payer: No Typology Code available for payment source | Attending: Student in an Organized Health Care Education/Training Program | Admitting: Student in an Organized Health Care Education/Training Program

## 2023-01-16 VITALS — BP 123/94 | HR 64 | Temp 98.4°F | Resp 17 | Ht 69.0 in | Wt 170.0 lb

## 2023-01-16 DIAGNOSIS — M47816 Spondylosis without myelopathy or radiculopathy, lumbar region: Secondary | ICD-10-CM | POA: Insufficient documentation

## 2023-01-16 MED ORDER — ROPIVACAINE HCL 2 MG/ML IJ SOLN
9.0000 mL | Freq: Once | INTRAMUSCULAR | Status: AC
  Administered 2023-01-16: 9 mL via PERINEURAL
  Filled 2023-01-16: qty 20

## 2023-01-16 MED ORDER — DEXAMETHASONE SODIUM PHOSPHATE 10 MG/ML IJ SOLN
10.0000 mg | Freq: Once | INTRAMUSCULAR | Status: AC
  Administered 2023-01-16: 10 mg
  Filled 2023-01-16: qty 1

## 2023-01-16 MED ORDER — LIDOCAINE HCL 2 % IJ SOLN
20.0000 mL | Freq: Once | INTRAMUSCULAR | Status: AC
  Administered 2023-01-16: 400 mg
  Filled 2023-01-16: qty 40

## 2023-01-16 NOTE — Progress Notes (Signed)
Safety precautions to be maintained throughout the outpatient stay will include: orient to surroundings, keep bed in low position, maintain call bell within reach at all times, provide assistance with transfer out of bed and ambulation.  

## 2023-01-16 NOTE — Progress Notes (Signed)
PROVIDER NOTE: Interpretation of information contained herein should be left to medically-trained personnel. Specific patient instructions are provided elsewhere under "Patient Instructions" section of medical record. This document was created in part using STT-dictation technology, any transcriptional errors that may result from this process are unintentional.  Patient: Danny Marquez Type: Established DOB: 02-Dec-1945 MRN: [id number] PCP: [facility]  Service: Procedure DOS: 01/16/2023 Setting: Ambulatory Location: Ambulatory outpatient facility Delivery: Face-to-face Provider: Edward Jolly, MD Specialty: Interventional Pain Management Specialty designation: 09 Location: Outpatient facility Ref. Prov.: [facility]       Interventional Therapy   Procedure: Lumbar Facet, Medial Branch Block(s) #1  Laterality: Right  Level: L1, L2, and L3 Medial Branch Level(s). Injecting these levels blocks the L1-2 and L2-3 lumbar facet joints. Imaging: Fluoroscopic guidance         Anesthesia: Local anesthesia (1-2% Lidocaine) DOS: 01/16/2023 Performed by: Edward Jolly, MD  Primary Purpose: Diagnostic/Therapeutic Indications: Low back pain severe enough to impact quality of life or function. 1. Lumbar facet arthropathy   2. Lumbar spondylosis    NAS-11 Pain score:   Pre-procedure: 8 /10   Post-procedure: 5 /10     Position / Prep / Materials:  Position: Prone  Prep solution: DuraPrep (Iodine Povacrylex [0.7% available iodine] and Isopropyl Alcohol, 74% w/w) Area Prepped: Posterolateral Lumbosacral Spine (Wide prep: From the lower border of the scapula down to the end of the tailbone and from flank to flank.)  Materials:  Tray: Block Needle(s):  Type: Spinal  Gauge (G): 22  Length: 3.5-in Qty: 3      Pre-op H&P Assessment:  Danny Marquez is a 77 y.o. (year old), male patient, seen today for interventional treatment. He  has a past surgical history that includes Coronary  artery bypass graft; Back surgery; CARDIOVERSION (N/A, 01/23/2017); CARDIOVERSION (N/A, 02/01/2017); Joint replacement; Colonoscopy with propofol (N/A, 03/05/2019); and Esophagogastroduodenoscopy (egd) with propofol (N/A, 03/05/2019). Danny Marquez has a current medication list which includes the following prescription(s): allopurinol, amiodarone, aspirin ec, buprenorphine, donepezil hcl, empagliflozin, losartan, metoprolol tartrate, oxycodone, pregabalin, ranolazine, rosuvastatin, sertraline, torsemide, trazodone hcl, AMBULATORY NON FORMULARY MEDICATION, and pantoprazole. His primarily concern today is the Back Pain (lower)  Initial Vital Signs:  Pulse/HCG Rate: 64ECG Heart Rate: 64 Temp: 98.4 F (36.9 C) Resp: 16 BP: (!) 112/58 SpO2: 100 %  BMI: Estimated body mass index is 25.1 kg/m as calculated from the following:   Height as of this encounter: 5\' 9"  (1.753 m).   Weight as of this encounter: 170 lb (77.1 kg).  Risk Assessment: Allergies: Reviewed. He is allergic to codeine.  Allergy Precautions: None required Coagulopathies: Reviewed. None identified.  Blood-thinner therapy: None at this time Active Infection(s): Reviewed. None identified. Danny Marquez is afebrile  Site Confirmation: Danny Marquez was asked to confirm the procedure and laterality before marking the site Procedure checklist: Completed Consent: Before the procedure and under the influence of no sedative(s), amnesic(s), or anxiolytics, the patient was informed of the treatment options, risks and possible complications. To fulfill our ethical and legal obligations, as recommended by the American Medical Association's Code of Ethics, I have informed the patient of my clinical impression; the nature and purpose of the treatment or procedure; the risks, benefits, and possible complications of the intervention; the alternatives, including doing nothing; the risk(s) and benefit(s) of the alternative treatment(s) or procedure(s); and the  risk(s) and benefit(s) of doing nothing. The patient was provided information about the general risks and possible complications associated with the procedure. These  may include, but are not limited to: failure to achieve desired goals, infection, bleeding, organ or nerve damage, allergic reactions, paralysis, and death. In addition, the patient was informed of those risks and complications associated to Spine-related procedures, such as failure to decrease pain; infection (i.e.: Meningitis, epidural or intraspinal abscess); bleeding (i.e.: epidural hematoma, subarachnoid hemorrhage, or any other type of intraspinal or peri-dural bleeding); organ or nerve damage (i.e.: Any type of peripheral nerve, nerve root, or spinal cord injury) with subsequent damage to sensory, motor, and/or autonomic systems, resulting in permanent pain, numbness, and/or weakness of one or several areas of the body; allergic reactions; (i.e.: anaphylactic reaction); and/or death. Furthermore, the patient was informed of those risks and complications associated with the medications. These include, but are not limited to: allergic reactions (i.e.: anaphylactic or anaphylactoid reaction(s)); adrenal axis suppression; blood sugar elevation that in diabetics may result in ketoacidosis or comma; water retention that in patients with history of congestive heart failure may result in shortness of breath, pulmonary edema, and decompensation with resultant heart failure; weight gain; swelling or edema; medication-induced neural toxicity; particulate matter embolism and blood vessel occlusion with resultant organ, and/or nervous system infarction; and/or aseptic necrosis of one or more joints. Finally, the patient was informed that Medicine is not an exact science; therefore, there is also the possibility of unforeseen or unpredictable risks and/or possible complications that may result in a catastrophic outcome. The patient indicated having  understood very clearly. We have given the patient no guarantees and we have made no promises. Enough time was given to the patient to ask questions, all of which were answered to the patient's satisfaction. Mr. Rensel has indicated that he wanted to continue with the procedure. Attestation: I, the ordering provider, attest that I have discussed with the patient the benefits, risks, side-effects, alternatives, likelihood of achieving goals, and potential problems during recovery for the procedure that I have provided informed consent. Date  Time: 01/16/2023 10:27 AM   Pre-Procedure Preparation:  Monitoring: As per clinic protocol. Respiration, ETCO2, SpO2, BP, heart rate and rhythm monitor placed and checked for adequate function Safety Precautions: Patient was assessed for positional comfort and pressure points before starting the procedure. Time-out: I initiated and conducted the "Time-out" before starting the procedure, as per protocol. The patient was asked to participate by confirming the accuracy of the "Time Out" information. Verification of the correct person, site, and procedure were performed and confirmed by me, the nursing staff, and the patient. "Time-out" conducted as per Joint Commission's Universal Protocol (UP.01.01.01). Time: 1117  Description of Procedure:          Laterality: (see above) Targeted Levels: (see above)  Safety Precautions: Aspiration looking for blood return was conducted prior to all injections. At no point did we inject any substances, as a needle was being advanced. Before injecting, the patient was told to immediately notify me if he was experiencing any new onset of "ringing in the ears, or metallic taste in the mouth". No attempts were made at seeking any paresthesias. Safe injection practices and needle disposal techniques used. Medications properly checked for expiration dates. SDV (single dose vial) medications used. After the completion of the procedure, all  disposable equipment used was discarded in the proper designated medical waste containers. Local Anesthesia: Protocol guidelines were followed. The patient was positioned over the fluoroscopy table. The area was prepped in the usual manner. The time-out was completed. The target area was identified using fluoroscopy. A 12-in long, straight, sterile  hemostat was used with fluoroscopic guidance to locate the targets for each level blocked. Once located, the skin was marked with an approved surgical skin marker. Once all sites were marked, the skin (epidermis, dermis, and hypodermis), as well as deeper tissues (fat, connective tissue and muscle) were infiltrated with a small amount of a short-acting local anesthetic, loaded on a 10cc syringe with a 25G, 1.5-in  Needle. An appropriate amount of time was allowed for local anesthetics to take effect before proceeding to the next step. Local Anesthetic: Lidocaine 2.0% The unused portion of the local anesthetic was discarded in the proper designated containers. Technical description of process:  L1 Medial Branch Nerve Block (MBB): The target area for the L1 medial branch is at the junction of the postero-lateral aspect of the superior articular process and the superior, posterior, and medial edge of the transverse process of L2. Under fluoroscopic guidance, a Quincke needle was inserted until contact was made with os over the superior postero-lateral aspect of the pedicular shadow (target area). After negative aspiration for blood, 2 mL of the nerve block solution was injected without difficulty or complication. The needle was removed intact. L2 Medial Branch Nerve Block (MBB): The target area for the L2 medial branch is at the junction of the postero-lateral aspect of the superior articular process and the superior, posterior, and medial edge of the transverse process of L3. Under fluoroscopic guidance, a Quincke needle was inserted until contact was made with os over  the superior postero-lateral aspect of the pedicular shadow (target area). After negative aspiration for blood, 2mL of the nerve block solution was injected without difficulty or complication. The needle was removed intact. L3 Medial Branch Nerve Block (MBB): The target area for the L3 medial branch is at the junction of the postero-lateral aspect of the superior articular process and the superior, posterior, and medial edge of the transverse process of L4. Under fluoroscopic guidance, a Quincke needle was inserted until contact was made with os over the superior postero-lateral aspect of the pedicular shadow (target area). After negative aspiration for blood, 2mL of the nerve block solution was injected without difficulty or complication. The needle was removed intact.   Nerve block solution: 5 cc of 0.2% ropivacaine, 1 cc of Decadron 10 mg/cc.  2 cc injected at each level above on the right   Once the entire procedure was completed, the treated area was cleaned, making sure to leave some of the prepping solution back to take advantage of its long term bactericidal properties.         Illustration of the posterior view of the lumbar spine and the posterior neural structures. Laminae of L2 through S1 are labeled. DPRL5, dorsal primary ramus of L5; DPRS1, dorsal primary ramus of S1; DPR3, dorsal primary ramus of L3; FJ, facet (zygapophyseal) joint L3-L4; I, inferior articular process of L4; LB1, lateral branch of dorsal primary ramus of L1; IAB, inferior articular branches from L3 medial branch (supplies L4-L5 facet joint); IBP, intermediate branch plexus; MB3, medial branch of dorsal primary ramus of L3; NR3, third lumbar nerve root; S, superior articular process of L5; SAB, superior articular branches from L4 (supplies L4-5 facet joint also); TP3, transverse process of L3.  Vitals:   01/16/23 1030 01/16/23 1117 01/16/23 1120  BP: (!) 112/58 125/78 (!) 123/94  Pulse: 64    Resp:  16 17  Temp:  98.4 F (36.9 C)    TempSrc: Temporal    SpO2: 100% 95% 96%  Weight:  170 lb (77.1 kg)    Height: 5\' 9"  (1.753 m)       Start Time: 1117 hrs. End Time: 1119 hrs.  Imaging Guidance (Spinal):          Type of Imaging Technique: Fluoroscopy Guidance (Spinal) Indication(s): Assistance in needle guidance and placement for procedures requiring needle placement in or near specific anatomical locations not easily accessible without such assistance. Exposure Time: Please see nurses notes. Contrast: None used. Fluoroscopic Guidance: I was personally present during the use of fluoroscopy. "Tunnel Vision Technique" used to obtain the best possible view of the target area. Parallax error corrected before commencing the procedure. "Direction-depth-direction" technique used to introduce the needle under continuous pulsed fluoroscopy. Once target was reached, antero-posterior, oblique, and lateral fluoroscopic projection used confirm needle placement in all planes. Images permanently stored in EMR. Interpretation: No contrast injected. I personally interpreted the imaging intraoperatively. Adequate needle placement confirmed in multiple planes. Permanent images saved into the patient's record.  Post-operative Assessment:  Post-procedure Vital Signs:  Pulse/HCG Rate: 6466 Temp: 98.4 F (36.9 C) Resp: 17 BP: (!) 123/94 SpO2: 96 %  EBL: None  Complications: No immediate post-treatment complications observed by team, or reported by patient.  Note: The patient tolerated the entire procedure well. A repeat set of vitals were taken after the procedure and the patient was kept under observation following institutional policy, for this type of procedure. Post-procedural neurological assessment was performed, showing return to baseline, prior to discharge. The patient was provided with post-procedure discharge instructions, including a section on how to identify potential problems. Should any problems arise  concerning this procedure, the patient was given instructions to immediately contact us, at any time, without hesitation. In any case, we plan to contact the patient by telephone for a follow-up status report regarding this interventional procedure.  Comments:  No additional relevant information.  Plan of Care (POC)  Orders:  Orders Placed This Encounter  Procedures   DG PAIN CL[facility] C-ARM 1-60 MIN NO REPORT    Intraoperative interpretation by procedural physician at [facility].    Standing Status:   Standing    Number of Occurrences:   1    Order Specific Question:   Reason for exam:    Answer:   Assistance in needle guidance and placement for procedures requiring needle placement in or near specific anatomical locations not easily accessible without such assistance.     Medications ordered for procedure: Meds ordered this encounter  Medications   lidocaine (XYLOCAINE) 2 % (with pres) injection 400 mg   dexamethasone (DECADRON) injection 10 mg   ropivacaine (PF) 2 mg/mL (0.2%) (NAROPIN) injection 9 mL   Medications administered: We administered lidocaine, dexamethasone, and ropivacaine (PF) 2 mg/mL (0.2%).  See the medical record for exact dosing, route, and time of administration.  Follow-up plan:   Return in about 4 weeks (around 02/13/2023) for Post Procedure Evaluation, virtual.       Recent Visits Date Type Provider Dept  12/25/22 Office Visit Edward Jolly, MD [facility]  11/28/22 Procedure visit Edward Jolly, MD [facility]  11/15/22 Office Visit Edward Jolly, MD [facility]  Showing recent visits within past 90 days and meeting all other requirements Today's Visits Date Type Provider Dept  01/16/23 Procedure visit Edward Jolly, MD [facility]  Showing today's visits and meeting all other requirements Future Appointments Date Type Provider Dept  02/13/23 Appointment Edward Jolly, MD [facility]   Showing future appointments  within next 90 days and meeting all other requirements  Disposition: Discharge home  Discharge (Date  Time): 01/16/2023; 1130 hrs.   Primary Care Physician: [facility] Location[facility] Note by: Edward Jolly, MD (TTS technology used. I apologize for any typographical errors that were not detected and corrected.) Date: 01/16/2023; Time: 11:57 AM  Disclaimer:  Medicine is not an Visual merchandiser. The only guarantee in medicine is that nothing is guaranteed. It is important to note that the decision to proceed with this intervention was based on the information collected from the patient. The Data and conclusions were drawn from the patient's questionnaire, the interview, and the physical examination. Because the information was provided in large part by the patient, it cannot be guaranteed that it has not been purposely or unconsciously manipulated. Every effort has been made to obtain as much relevant data as possible for this evaluation. It is important to note that the conclusions that lead to this procedure are derived in large part from the available data. Always take into account that the treatment will also be dependent on availability of resources and existing treatment guidelines, considered by other Pain Management Practitioners as being common knowledge and practice, at the time of the intervention. For Medico-Legal purposes, it is also important to point out that variation in procedural techniques and pharmacological choices are the acceptable norm. The indications, contraindications, technique, and results of the above procedure should only be interpreted and judged by a Board-Certified Interventional Pain Specialist with extensive familiarity and expertise in the same exact procedure and technique.

## 2023-01-17 ENCOUNTER — Telehealth: Payer: Self-pay

## 2023-01-17 NOTE — Telephone Encounter (Signed)
Post procedure follow up.  Patients wife states he is sleeping but she thinks he is doing good.  Instructed to call for any questions or concerns.

## 2023-01-22 ENCOUNTER — Encounter: Payer: Self-pay | Admitting: Student in an Organized Health Care Education/Training Program

## 2023-01-22 ENCOUNTER — Ambulatory Visit
Payer: No Typology Code available for payment source | Attending: Student in an Organized Health Care Education/Training Program | Admitting: Student in an Organized Health Care Education/Training Program

## 2023-01-22 VITALS — BP 113/63 | HR 71 | Temp 98.2°F | Resp 16 | Ht 69.0 in | Wt 169.0 lb

## 2023-01-22 DIAGNOSIS — G894 Chronic pain syndrome: Secondary | ICD-10-CM

## 2023-01-22 DIAGNOSIS — M47816 Spondylosis without myelopathy or radiculopathy, lumbar region: Secondary | ICD-10-CM

## 2023-01-22 MED ORDER — OXYCODONE-ACETAMINOPHEN 5-325 MG PO TABS: 1.0000 | ORAL_TABLET | Freq: Every day | ORAL | 0 refills | Status: DC | PRN

## 2023-01-22 MED ORDER — OXYCODONE-ACETAMINOPHEN 5-325 MG PO TABS: 1.0000 | ORAL_TABLET | Freq: Every day | ORAL | 0 refills | Status: AC | PRN

## 2023-01-22 NOTE — Patient Instructions (Signed)

## 2023-01-22 NOTE — Progress Notes (Signed)
PROVIDER NOTE: Information contained herein reflects review and annotations entered in association with encounter. Interpretation of such information and data should be left to medically-trained personnel. Information provided to patient can be located elsewhere in the medical record under "Patient Instructions". Document created using STT-dictation technology, any transcriptional errors that may result from process are unintentional.    Patient: Danny Marquez  Service Category: E/M  Provider: Edward Jolly, MD  DOB: 31-Jul-1946  DOS: 01/22/2023  Referring Provider: [facility]  MRN: [id number]  Specialty: Interventional Pain Management  [facility]  Type: Established Patient  Setting: Ambulatory outpatient    Location: Office  Delivery: Face-to-face     HPI  Danny Marquez, a 77 y.o. year old male, is here today because of his Lumbar facet arthropathy [M47.816]. Danny Marquez primary complain today is Back Pain (Lower, bilateral)  Pertinent problems: Danny Marquez has History of myocardial infarction; Spinal stenosis, lumbar region, with neurogenic claudication; History of lumbar fusion; Lumbar radiculopathy; S/P fusion of sacroiliac joint (right); Chronic pain syndrome; Chronic right SI joint pain; and Lumbar facet arthropathy on their pertinent problem list. Pain Assessment: Severity of Chronic pain is reported as a 7 /10. Location: Back Lower/denies. Onset: More than a month ago. Quality: Constant, Discomfort. Timing: Constant. Modifying factor(s): nothing. Vitals:  height is 5\' 9"  (1.753 m) and weight is 169 lb (76.7 kg). His temporal temperature is 98.2 F (36.8 C). His blood pressure is 113/63 and his pulse is 71. His respiration is 16 and oxygen saturation is 97%.  BMI: Estimated body mass index is 24.96 kg/m as calculated from the following:   Height as of this encounter: 5\' 9"  (1.753 m).   Weight as of this encounter: 169 lb (76.7 kg). Last encounter:  11/15/2022. Last procedure: 11/28/2022.  Reason for encounter: post-procedure evaluation and assessment and medication management  Status post second diagnostic right L1-L2-L3 medial branch nerve block.  Results are below. Requesting to repeat for the left side and then consider lumbar radiofrequency ablation. Trial of Butrans was not effective.  Takes oxycodone very sporadically and is hoping to get a prescription for that so that he can take 1 tablet daily as needed for moderate to severe pain. I will obtain baseline urine toxicology screen and have patient sign pain contract.  I will prescribe Percocet 5 mg that he can take daily as needed for moderate to severe pain.   Pharmacotherapy Assessment  Analgesic: Percocet 5 mg daily as needed  Monitoring: [place] PMP: PDMP reviewed during this encounter.       Pharmacotherapy: No side-effects or adverse reactions reported. Compliance: No problems identified. Effectiveness: Clinically acceptable.  UDS: No results found for: "SUMMARY"     Post-procedure evaluation   Procedure: Lumbar Facet, Medial Branch Block(s) #2 Laterality: Right  Level: L1, L2, and L3 Medial Branch Level(s). Injecting these levels blocks the L1-2 and L2-3 lumbar facet joints. Imaging: Fluoroscopic guidance         Anesthesia: Local anesthesia (1-2% Lidocaine) DOS: 01/16/2023 Performed by: Edward Jolly, MD  Primary Purpose: Diagnostic/Therapeutic Indications: Low back pain severe enough to impact quality of life or function. 1. Lumbar facet arthropathy   2. Lumbar spondylosis    NAS-11 Pain score:   Pre-procedure: 8 /10   Post-procedure: 5 /10      Effectiveness:  Initial hour after procedure: 100 % . Subsequent 4-6 hours post-procedure: 100 %. Analgesia past initial 6 hours: 60% Ongoing improvement:  Analgesic:  60% for 3 days  ROS  Constitutional: Denies any fever or chills Gastrointestinal: No reported hemesis, hematochezia, vomiting, or acute GI  distress Musculoskeletal:  right + left LBP Neurological: No reported episodes of acute onset apraxia, aphasia, dysarthria, agnosia, amnesia, paralysis, loss of coordination, or loss of consciousness  Medication Review  Donepezil HCl, allopurinol, amiodarone, aspirin EC, empagliflozin, losartan, metoprolol tartrate, oxyCODONE-acetaminophen, oxycodone, pregabalin, ranolazine, rosuvastatin, sertraline, torsemide, and traZODone HCl  History Review  Allergy: Danny Marquez is allergic to codeine. Drug: Danny Marquez  reports that he does not currently use drugs. Alcohol:  reports that he does not currently use alcohol after a past usage of about 24.0 standard drinks of alcohol per week. Tobacco:  reports that he has quit smoking. His smoking use included cigarettes. He has never used smokeless tobacco. Social: Danny Marquez  reports that he has quit smoking. His smoking use included cigarettes. He has never used smokeless tobacco. He reports that he does not currently use alcohol after a past usage of about 24.0 standard drinks of alcohol per week. He reports that he does not currently use drugs. Medical:  has a past medical history of A-fib (HCC), CHF (congestive heart failure) (HCC), Chronic kidney disease, Hypertension, Myocardial infarction (HCC), and Solitary kidney. Surgical: Danny Marquez  has a past surgical history that includes Coronary artery bypass graft; Back surgery; CARDIOVERSION (N/A, 01/23/2017); CARDIOVERSION (N/A, 02/01/2017); Joint replacement; Colonoscopy with propofol (N/A, 03/05/2019); and Esophagogastroduodenoscopy (egd) with propofol (N/A, 03/05/2019). Family: family history includes Heart attack in his father.  Laboratory Chemistry Profile   Renal Lab Results  Component Value Date   BUN 24 (H) 03/03/2022   CREATININE 1.45 (H) 03/03/2022   GFRAA >60 08/06/2019   GFRNONAA 50 (L) 03/03/2022    Hepatic Lab Results  Component Value Date   AST 45 (H) 03/03/2022   ALT 17 03/03/2022    ALBUMIN 3.1 (L) 03/03/2022   ALKPHOS 85 03/03/2022   LIPASE 356 (H) 08/06/2019   AMMONIA 29 08/06/2019    Electrolytes Lab Results  Component Value Date   NA 139 03/03/2022   K 4.3 03/03/2022   CL 110 03/03/2022   CALCIUM 9.8 03/03/2022   MG 2.1 05/20/2021    Bone No results found for: "VD25OH", "VD125OH2TOT", "ZO1096EA5", "WU9811BJ4", "25OHVITD1", "25OHVITD2", "25OHVITD3", "TESTOFREE", "TESTOSTERONE"  Inflammation (CRP: Acute Phase) (ESR: Chronic Phase) Lab Results  Component Value Date   LATICACIDVEN 1.1 08/06/2019         Note: Above Lab results reviewed.  Recent Imaging Review  DG PAIN CLINIC C-ARM 1-60 MIN NO REPORT Fluoro was used, but no Radiologist interpretation will be provided.  Please refer to "NOTES" tab for provider progress note. Note: Reviewed        Physical Exam  General appearance: Well nourished, well developed, and well hydrated. In no apparent acute distress Mental status: Alert, oriented x 3 (person, place, & time)       Respiratory: No evidence of acute respiratory distress Eyes: PERLA Vitals: BP 113/63   Pulse 71   Temp 98.2 F (36.8 C) (Temporal)   Resp 16   Ht 5\' 9"  (1.753 m)   Wt 169 lb (76.7 kg)   SpO2 97%   BMI 24.96 kg/m  BMI: Estimated body mass index is 24.96 kg/m as calculated from the following:   Height as of this encounter: 5\' 9"  (1.753 m).   Weight as of this encounter: 169 lb (76.7 kg). Ideal: Ideal body weight: 70.7 kg (155 lb 13.8 oz) Adjusted ideal body weight: 73.1 kg (161  lb 1.9 oz)  Bilateral low back pain, worse with facet loading  Assessment   Diagnosis Status  1. Lumbar facet arthropathy   2. Lumbar spondylosis   3. Chronic pain syndrome    Persistent Persistent Controlled   Updated Problems: No problems updated.   Plan of Care  1. Lumbar facet arthropathy - LUMBAR FACET(MEDIAL BRANCH NERVE BLOCK) MBNB; Future  2. Lumbar spondylosis - LUMBAR FACET(MEDIAL BRANCH NERVE BLOCK) MBNB; Future  3.  Chronic pain syndrome - LUMBAR FACET(MEDIAL BRANCH NERVE BLOCK) MBNB; Future - Compliance Drug Analysis, Ur - oxyCODONE-acetaminophen (PERCOCET) 5-325 MG tablet; Take 1 tablet by mouth daily as needed for severe pain. Must last 30 days.  Dispense: 30 tablet; Refill: 0    Orders:  Orders Placed This Encounter  Procedures   LUMBAR FACET(MEDIAL BRANCH NERVE BLOCK) MBNB    Standing Status:   Future    Standing Expiration Date:   04/24/2023    Scheduling Instructions:     Procedure: Lumbar facet block (AKA.: Lumbosacral medial branch nerve block)     Side: LEFT     Level: L1-2, L2-3, , Facets ( L1, L2, L3, Medial Branch)     Sedation: without     Timeframe: ASAA    Order Specific Question:   Where will this procedure be performed?    Answer:   [facility]   Compliance Drug Analysis, Ur    Volume: 30 ml(s). Minimum 3 ml of urine is needed. Document temperature of fresh sample. 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      Recent Visits Date Type Provider Dept  01/16/23 Procedure visit Edward Jolly, MD [facility]  12/25/22 Office Visit Edward Jolly, MD [facility]  11/28/22 Procedure visit Edward Jolly, MD [facility]  11/15/22 Office Visit Edward Jolly, MD [facility]  Showing recent visits within past 90 days and meeting all other requirements Today's Visits Date Type Provider Dept  01/22/23 Office Visit  Edward Jolly, MD [facility]  Showing today's visits and meeting all other requirements Future Appointments Date Type Provider Dept  02/13/23 Appointment Edward Jolly, MD [facility]  02/13/23 Appointment Edward Jolly, MD [facility]  Showing future appointments within next 90 days and meeting all other requirements  I discussed the assessment and treatment plan with the patient. The patient was provided an opportunity to ask questions and all were answered. The patient agreed with the plan and demonstrated an understanding of the instructions.  Patient advised to call back or seek an in-person evaluation if the symptoms or condition worsens.  Duration of encounter: .  Total time on encounter, as per AMA guidelines included both the face-to-face and non-face-to-face time personally spent by the physician and/or other qualified health care professional(s) on the day of the encounter (includes time in activities that require the physician or other qualified health care professional and does not include time in activities normally  performed by clinical staff). Physician's time may include the following activities when performed: Preparing to see the patient (e.g., pre-charting review of records, searching for previously ordered imaging, lab work, and nerve conduction tests) Review of prior analgesic pharmacotherapies. Reviewing PMP Interpreting ordered tests (e.g., lab work, imaging, nerve conduction tests) Performing post-procedure evaluations, including interpretation of diagnostic procedures Obtaining and/or reviewing separately obtained history Performing a medically appropriate examination and/or evaluation Counseling and educating the patient/family/caregiver Ordering medications, tests, or procedures Referring and communicating with other health care professionals (when not separately reported) Documenting clinical information in the electronic or  other health record Independently interpreting results (not separately reported) and communicating results to the patient/ family/caregiver Care coordination (not separately reported)  Note by: Edward Jolly, MD Date: 01/22/2023; Time: 11:36 AM

## 2023-01-22 NOTE — Progress Notes (Signed)
[facility] and [facility]

## 2023-01-25 LAB — COMPLIANCE DRUG ANALYSIS, UR

## 2023-02-13 ENCOUNTER — Ambulatory Visit
Payer: No Typology Code available for payment source | Attending: Student in an Organized Health Care Education/Training Program | Admitting: Student in an Organized Health Care Education/Training Program

## 2023-02-13 ENCOUNTER — Ambulatory Visit
Admission: RE | Admit: 2023-02-13 | Discharge: 2023-02-13 | Disposition: A | Discharge status: Home / Self Care | Payer: Medicare Other | Source: Ambulatory Visit | Attending: Student in an Organized Health Care Education/Training Program | Admitting: Student in an Organized Health Care Education/Training Program

## 2023-02-13 ENCOUNTER — Encounter: Payer: Self-pay | Admitting: Student in an Organized Health Care Education/Training Program

## 2023-02-13 ENCOUNTER — Telehealth
Payer: No Typology Code available for payment source | Admitting: Student in an Organized Health Care Education/Training Program

## 2023-02-13 VITALS — BP 126/77 | HR 88 | Temp 97.3°F | Resp 15 | Ht 69.0 in | Wt 175.0 lb

## 2023-02-13 DIAGNOSIS — M47816 Spondylosis without myelopathy or radiculopathy, lumbar region: Secondary | ICD-10-CM | POA: Diagnosis present

## 2023-02-13 MED ORDER — ROPIVACAINE HCL 2 MG/ML IJ SOLN
9.0000 mL | Freq: Once | INTRAMUSCULAR | Status: AC
  Administered 2023-02-13: 9 mL via PERINEURAL

## 2023-02-13 MED ORDER — ROPIVACAINE HCL 2 MG/ML IJ SOLN
INTRAMUSCULAR | Status: AC
  Filled 2023-02-13: qty 20

## 2023-02-13 MED ORDER — LIDOCAINE HCL 2 % IJ SOLN
INTRAMUSCULAR | Status: AC
  Filled 2023-02-13: qty 20

## 2023-02-13 MED ORDER — DEXAMETHASONE SODIUM PHOSPHATE 10 MG/ML IJ SOLN
10.0000 mg | Freq: Once | INTRAMUSCULAR | Status: AC
  Administered 2023-02-13: 10 mg

## 2023-02-13 MED ORDER — LIDOCAINE HCL 2 % IJ SOLN
20.0000 mL | Freq: Once | INTRAMUSCULAR | Status: AC
  Administered 2023-02-13: 400 mg

## 2023-02-13 MED ORDER — DEXAMETHASONE SODIUM PHOSPHATE 10 MG/ML IJ SOLN
INTRAMUSCULAR | Status: AC
  Filled 2023-02-13: qty 2

## 2023-02-13 NOTE — Patient Instructions (Signed)
Pain Management Discharge Instructions  General Discharge Instructions :  If you need to reach your doctor call: Monday-Friday 8:00 am - 4:00 pm at [phone] or toll free [phone].  After clinic hours [phone] to have operator reach doctor.  Bring all of your medication bottles to all your appointments in the pain clinic.  To cancel or reschedule your appointment with Pain Management please remember to call 24 hours in advance to avoid a fee.  Refer to the educational materials which you have been given on: General Risks, I had my Procedure. Discharge Instructions, Post Sedation.  Post Procedure Instructions:  The drugs you were given will stay in your system until tomorrow, so for the next 24 hours you should not drive, make any legal decisions or drink any alcoholic beverages.  You may eat anything you prefer, but it is better to start with liquids then soups and crackers, and gradually work up to solid foods.  Please notify your doctor immediately if you have any unusual bleeding, trouble breathing or pain that is not related to your normal pain.  Depending on the type of procedure that was done, some parts of your body may feel week and/or numb.  This usually clears up by tonight or the next day.  Walk with the use of an assistive device or accompanied by an adult for the 24 hours.  You may use ice on the affected area for the first 24 hours.  Put ice in a Ziploc bag and cover with a towel and place against area 15 minutes on 15 minutes off.  You may switch to heat after 24 hours.Facet Blocks Patient Information  Description: The facets are joints in the spine between the vertebrae.  Like any joints in the body, facets can become irritated and painful.  Arthritis can also effect the facets.  By injecting steroids and local anesthetic in and around these joints, we can temporarily block the nerve supply to them.  Steroids act directly on irritated nerves and tissues to  reduce selling and inflammation which often leads to decreased pain.  Facet blocks may be done anywhere along the spine from the neck to the low back depending upon the location of your pain.   After numbing the skin with local anesthetic (like Novocaine), a small needle is passed onto the facet joints under x-ray guidance.  You may experience a sensation of pressure while this is being done.  The entire block usually lasts about 15-25 minutes.   Conditions which may be treated by facet blocks:  Low back/buttock pain Neck/shoulder pain Certain types of headaches  Preparation for the injection:  Do not eat any solid food or dairy products within 8 hours of your appointment. You may drink clear liquid up to 3 hours before appointment.  Clear liquids include water, black coffee, juice or soda.  No milk or cream please. You may take your regular medication, including pain medications, with a sip of water before your appointment.  Diabetics should hold regular insulin (if taken separately) and take 1/2 normal NPH dose the morning of the procedure.  Carry some sugar containing items with you to your appointment. A driver must accompany you and be prepared to drive you home after your procedure. Bring all your current medications with you. An IV may be inserted and sedation may be given at the discretion of the physician. A blood pressure cuff, EKG and other monitors will often be applied during the procedure.  Some patients may need to   have extra oxygen administered for a short period. You will be asked to provide medical information, including your allergies and medications, prior to the procedure.  We must know immediately if you are taking blood thinners (like Coumadin/Warfarin) or if you are allergic to IV iodine contrast (dye).  We must know if you could possible be pregnant.  Possible side-effects:  Bleeding from needle site Infection (rare, may require surgery) Nerve injury (rare) Numbness  & tingling (temporary) Difficulty urinating (rare, temporary) Spinal headache (a headache worse with upright posture) Light-headedness (temporary) Pain at injection site (serveral days) Decreased blood pressure (rare, temporary) Weakness in arm/leg (temporary) Pressure sensation in back/neck (temporary)   Call if you experience:  Fever/chills associated with headache or increased back/neck pain Headache worsened by an upright position New onset, weakness or numbness of an extremity below the injection site Hives or difficulty breathing (go to the emergency room) Inflammation or drainage at the injection site(s) Severe back/neck pain greater than usual New symptoms which are concerning to you  Please note:  Although the local anesthetic injected can often make your back or neck feel good for several hours after the injection, the pain will likely return. It takes 3-7 days for steroids to work.  You may not notice any pain relief for at least one week.  If effective, we will often do a series of 2-3 injections spaced 3-6 weeks apart to maximally decrease your pain.  After the initial series, you may be a candidate for a more permanent nerve block of the facets.  If you have any questions, please call [phone] [facility] 

## 2023-02-13 NOTE — Progress Notes (Signed)
PROVIDER NOTE: Interpretation of information contained herein should be left to medically-trained personnel. Specific patient instructions are provided elsewhere under "Patient Instructions" section of medical record. This document was created in part using STT-dictation technology, any transcriptional errors that may result from this process are unintentional.  Patient: Danny Marquez Type: Established DOB: 06/29/1946 MRN: [id number] PCP: [facility]  Service: Procedure DOS: 02/13/2023 Setting: Ambulatory Location: Ambulatory outpatient facility Delivery: Face-to-face Provider: Edward Jolly, MD Specialty: Interventional Pain Management Specialty designation: 09 Location: Outpatient facility Ref. Prov.: [facility]       Interventional Therapy   Procedure: Lumbar Facet, Medial Branch Block(s) #1  Laterality: Left  Level: L1, L2, and L3 Medial Branch Level(s). Injecting these levels blocks the L1-2 and L2-3 lumbar facet joints. Imaging: Fluoroscopic guidance         Anesthesia: Local anesthesia (1-2% Lidocaine) DOS: 02/13/2023 Performed by: Edward Jolly, MD  Primary Purpose: Diagnostic/Therapeutic Indications: Low back pain severe enough to impact quality of life or function. 1. Lumbar facet arthropathy   2. Lumbar spondylosis    NAS-11 Pain score:   Pre-procedure: 7 /10   Post-procedure: 7 /10     Position / Prep / Materials:  Position: Prone  Prep solution: DuraPrep (Iodine Povacrylex [0.7% available iodine] and Isopropyl Alcohol, 74% w/w) Area Prepped: Posterolateral Lumbosacral Spine (Wide prep: From the lower border of the scapula down to the end of the tailbone and from flank to flank.)  Materials:  Tray: Block Needle(s):  Type: Spinal  Gauge (G): 22  Length: 3.5-in Qty: 3      Pre-op H&P Assessment:  Danny Marquez is a 77 y.o. (year old), male patient, seen today for interventional treatment. He  has a past surgical history that includes Coronary  artery bypass graft; Back surgery; CARDIOVERSION (N/A, 01/23/2017); CARDIOVERSION (N/A, 02/01/2017); Joint replacement; Colonoscopy with propofol (N/A, 03/05/2019); and Esophagogastroduodenoscopy (egd) with propofol (N/A, 03/05/2019). Danny Marquez has a current medication list which includes the following prescription(s): allopurinol, aspirin ec, donepezil hcl, empagliflozin, losartan, metoprolol tartrate, oxycodone-acetaminophen, pantoprazole, pregabalin, ranolazine, rosuvastatin, sertraline, torsemide, trazodone hcl, amiodarone, and oxycodone. His primarily concern today is the Back Pain (Lumbar left side )  Initial Vital Signs:  Pulse/HCG Rate: 88ECG Heart Rate: 62 Temp: (!) 97.3 F (36.3 C) Resp: 16 BP: 132/81 SpO2: 96 %  BMI: Estimated body mass index is 25.84 kg/m as calculated from the following:   Height as of this encounter: 5\' 9"  (1.753 m).   Weight as of this encounter: 175 lb (79.4 kg).  Risk Assessment: Allergies: Reviewed. He is allergic to codeine.  Allergy Precautions: None required Coagulopathies: Reviewed. None identified.  Blood-thinner therapy: None at this time Active Infection(s): Reviewed. None identified. Danny Marquez is afebrile  Site Confirmation: Danny Marquez was asked to confirm the procedure and laterality before marking the site Procedure checklist: Completed Consent: Before the procedure and under the influence of no sedative(s), amnesic(s), or anxiolytics, the patient was informed of the treatment options, risks and possible complications. To fulfill our ethical and legal obligations, as recommended by the American Medical Association's Code of Ethics, I have informed the patient of my clinical impression; the nature and purpose of the treatment or procedure; the risks, benefits, and possible complications of the intervention; the alternatives, including doing nothing; the risk(s) and benefit(s) of the alternative treatment(s) or procedure(s); and the risk(s) and  benefit(s) of doing nothing. The patient was provided information about the general risks and possible complications associated with the procedure. These may  include, but are not limited to: failure to achieve desired goals, infection, bleeding, organ or nerve damage, allergic reactions, paralysis, and death. In addition, the patient was informed of those risks and complications associated to Spine-related procedures, such as failure to decrease pain; infection (i.e.: Meningitis, epidural or intraspinal abscess); bleeding (i.e.: epidural hematoma, subarachnoid hemorrhage, or any other type of intraspinal or peri-dural bleeding); organ or nerve damage (i.e.: Any type of peripheral nerve, nerve root, or spinal cord injury) with subsequent damage to sensory, motor, and/or autonomic systems, resulting in permanent pain, numbness, and/or weakness of one or several areas of the body; allergic reactions; (i.e.: anaphylactic reaction); and/or death. Furthermore, the patient was informed of those risks and complications associated with the medications. These include, but are not limited to: allergic reactions (i.e.: anaphylactic or anaphylactoid reaction(s)); adrenal axis suppression; blood sugar elevation that in diabetics may result in ketoacidosis or comma; water retention that in patients with history of congestive heart failure may result in shortness of breath, pulmonary edema, and decompensation with resultant heart failure; weight gain; swelling or edema; medication-induced neural toxicity; particulate matter embolism and blood vessel occlusion with resultant organ, and/or nervous system infarction; and/or aseptic necrosis of one or more joints. Finally, the patient was informed that Medicine is not an exact science; therefore, there is also the possibility of unforeseen or unpredictable risks and/or possible complications that may result in a catastrophic outcome. The patient indicated having understood very  clearly. We have given the patient no guarantees and we have made no promises. Enough time was given to the patient to ask questions, all of which were answered to the patient's satisfaction. Mr. Kiraly has indicated that he wanted to continue with the procedure. Attestation: I, the ordering provider, attest that I have discussed with the patient the benefits, risks, side-effects, alternatives, likelihood of achieving goals, and potential problems during recovery for the procedure that I have provided informed consent. Date  Time: 02/13/2023 10:58 AM   Pre-Procedure Preparation:  Monitoring: As per clinic protocol. Respiration, ETCO2, SpO2, BP, heart rate and rhythm monitor placed and checked for adequate function Safety Precautions: Patient was assessed for positional comfort and pressure points before starting the procedure. Time-out: I initiated and conducted the "Time-out" before starting the procedure, as per protocol. The patient was asked to participate by confirming the accuracy of the "Time Out" information. Verification of the correct person, site, and procedure were performed and confirmed by me, the nursing staff, and the patient. "Time-out" conducted as per Joint Commission's Universal Protocol (UP.01.01.01). Time: 1135  Description of Procedure:          Laterality: (see above) Targeted Levels: (see above)  Safety Precautions: Aspiration looking for blood return was conducted prior to all injections. At no point did we inject any substances, as a needle was being advanced. Before injecting, the patient was told to immediately notify me if he was experiencing any new onset of "ringing in the ears, or metallic taste in the mouth". No attempts were made at seeking any paresthesias. Safe injection practices and needle disposal techniques used. Medications properly checked for expiration dates. SDV (single dose vial) medications used. After the completion of the procedure, all disposable  equipment used was discarded in the proper designated medical waste containers. Local Anesthesia: Protocol guidelines were followed. The patient was positioned over the fluoroscopy table. The area was prepped in the usual manner. The time-out was completed. The target area was identified using fluoroscopy. A 12-in long, straight, sterile hemostat  was used with fluoroscopic guidance to locate the targets for each level blocked. Once located, the skin was marked with an approved surgical skin marker. Once all sites were marked, the skin (epidermis, dermis, and hypodermis), as well as deeper tissues (fat, connective tissue and muscle) were infiltrated with a small amount of a short-acting local anesthetic, loaded on a 10cc syringe with a 25G, 1.5-in  Needle. An appropriate amount of time was allowed for local anesthetics to take effect before proceeding to the next step. Local Anesthetic: Lidocaine 2.0% The unused portion of the local anesthetic was discarded in the proper designated containers. Technical description of process:  L1 Medial Branch Nerve Block (MBB): The target area for the L1 medial branch is at the junction of the postero-lateral aspect of the superior articular process and the superior, posterior, and medial edge of the transverse process of L2. Under fluoroscopic guidance, a Quincke needle was inserted until contact was made with os over the superior postero-lateral aspect of the pedicular shadow (target area). After negative aspiration for blood, 2 mL of the nerve block solution was injected without difficulty or complication. The needle was removed intact. L2 Medial Branch Nerve Block (MBB): The target area for the L2 medial branch is at the junction of the postero-lateral aspect of the superior articular process and the superior, posterior, and medial edge of the transverse process of L3. Under fluoroscopic guidance, a Quincke needle was inserted until contact was made with os over the  superior postero-lateral aspect of the pedicular shadow (target area). After negative aspiration for blood, 2mL of the nerve block solution was injected without difficulty or complication. The needle was removed intact. L3 Medial Branch Nerve Block (MBB): The target area for the L3 medial branch is at the junction of the postero-lateral aspect of the superior articular process and the superior, posterior, and medial edge of the transverse process of L4. Under fluoroscopic guidance, a Quincke needle was inserted until contact was made with os over the superior postero-lateral aspect of the pedicular shadow (target area). After negative aspiration for blood, 2mL of the nerve block solution was injected without difficulty or complication. The needle was removed intact.   Nerve block solution: 4 cc of 0.2% ropivacaine, 2 cc of Decadron 10 mg/cc.  2 cc injected at each level above on the left   Once the entire procedure was completed, the treated area was cleaned, making sure to leave some of the prepping solution back to take advantage of its long term bactericidal properties.         Illustration of the posterior view of the lumbar spine and the posterior neural structures. Laminae of L2 through S1 are labeled. DPRL5, dorsal primary ramus of L5; DPRS1, dorsal primary ramus of S1; DPR3, dorsal primary ramus of L3; FJ, facet (zygapophyseal) joint L3-L4; I, inferior articular process of L4; LB1, lateral branch of dorsal primary ramus of L1; IAB, inferior articular branches from L3 medial branch (supplies L4-L5 facet joint); IBP, intermediate branch plexus; MB3, medial branch of dorsal primary ramus of L3; NR3, third lumbar nerve root; S, superior articular process of L5; SAB, superior articular branches from L4 (supplies L4-5 facet joint also); TP3, transverse process of L3.  Vitals:   02/13/23 1059 02/13/23 1129 02/13/23 1135 02/13/23 1140  BP: 132/81 111/80 124/80 126/77  Pulse: 88     Resp: 16 18 17  15   Temp: (!) 97.3 F (36.3 C)     TempSrc: Temporal     SpO2:  96% 100% 95% 95%  Weight: 175 lb (79.4 kg)     Height: 5\' 9"  (1.753 m)        Start Time: 1135 hrs. End Time: 1139 hrs.  Imaging Guidance (Spinal):          Type of Imaging Technique: Fluoroscopy Guidance (Spinal) Indication(s): Assistance in needle guidance and placement for procedures requiring needle placement in or near specific anatomical locations not easily accessible without such assistance. Exposure Time: Please see nurses notes. Contrast: None used. Fluoroscopic Guidance: I was personally present during the use of fluoroscopy. "Tunnel Vision Technique" used to obtain the best possible view of the target area. Parallax error corrected before commencing the procedure. "Direction-depth-direction" technique used to introduce the needle under continuous pulsed fluoroscopy. Once target was reached, antero-posterior, oblique, and lateral fluoroscopic projection used confirm needle placement in all planes. Images permanently stored in EMR. Interpretation: No contrast injected. I personally interpreted the imaging intraoperatively. Adequate needle placement confirmed in multiple planes. Permanent images saved into the patient's record.  Post-operative Assessment:  Post-procedure Vital Signs:  Pulse/HCG Rate: 8860 Temp: (!) 97.3 F (36.3 C) Resp: 15 BP: 126/77 SpO2: 95 %  EBL: None  Complications: No immediate post-treatment complications observed by team, or reported by patient.  Note: The patient tolerated the entire procedure well. A repeat set of vitals were taken after the procedure and the patient was kept under observation following institutional policy, for this type of procedure. Post-procedural neurological assessment was performed, showing return to baseline, prior to discharge. The patient was provided with post-procedure discharge instructions, including a section on how to identify potential problems. Should  any problems arise concerning this procedure, the patient was given instructions to immediately contact us, at any time, without hesitation. In any case, we plan to contact the patient by telephone for a follow-up status report regarding this interventional procedure.  Comments:  No additional relevant information.  Plan of Care (POC)  Orders:  Orders Placed This Encounter  Procedures   DG PAIN [facility] C-ARM 1-60 MIN NO REPORT    Intraoperative interpretation by procedural physician at [facility].    Standing Status:   Standing    Number of Occurrences:   1    Order Specific Question:   Reason for exam:    Answer:   Assistance in needle guidance and placement for procedures requiring needle placement in or near specific anatomical locations not easily accessible without such assistance.     Medications ordered for procedure: Meds ordered this encounter  Medications   lidocaine (XYLOCAINE) 2 % (with pres) injection 400 mg   dexamethasone (DECADRON) injection 10 mg   ropivacaine (PF) 2 mg/mL (0.2%) (NAROPIN) injection 9 mL   Medications administered: We administered lidocaine, dexamethasone, and ropivacaine (PF) 2 mg/mL (0.2%).  See the medical record for exact dosing, route, and time of administration.  Follow-up plan:   Return in about 4 weeks (around 03/13/2023) for f26f ppe.       Recent Visits Date Type Provider Dept  01/22/23 Office Visit Edward Jolly, MD [facility]  01/16/23 Procedure visit Edward Jolly, MD [facility]  12/25/22 Office Visit Edward Jolly, MD [facility]  11/28/22 Procedure visit Edward Jolly, MD [facility]  11/15/22 Office Visit Edward Jolly, MD [facility]  Showing recent visits within past 90 days and meeting all other requirements Today's Visits Date Type Provider Dept  02/13/23 Procedure visit Edward Jolly, MD [facility]  Showing today's  visits and meeting all other  requirements Future Appointments Date Type Provider Dept  03/13/23 Appointment Edward Jolly, MD [facility]  Showing future appointments within next 90 days and meeting all other requirements  Disposition: Discharge home  Discharge (Date  Time): 02/13/2023; 1150 hrs.   Primary Care Physician: [facility] Location[facility] Note by: Edward Jolly, MD (TTS technology used. I apologize for any typographical errors that were not detected and corrected.) Date: 02/13/2023; Time: 11:44 AM  Disclaimer:  Medicine is not an Visual merchandiser. The only guarantee in medicine is that nothing is guaranteed. It is important to note that the decision to proceed with this intervention was based on the information collected from the patient. The Data and conclusions were drawn from the patient's questionnaire, the interview, and the physical examination. Because the information was provided in large part by the patient, it cannot be guaranteed that it has not been purposely or unconsciously manipulated. Every effort has been made to obtain as much relevant data as possible for this evaluation. It is important to note that the conclusions that lead to this procedure are derived in large part from the available data. Always take into account that the treatment will also be dependent on availability of resources and existing treatment guidelines, considered by other Pain Management Practitioners as being common knowledge and practice, at the time of the intervention. For Medico-Legal purposes, it is also important to point out that variation in procedural techniques and pharmacological choices are the acceptable norm. The indications, contraindications, technique, and results of the above procedure should only be interpreted and judged by a Board-Certified Interventional Pain Specialist with extensive familiarity and expertise in the same exact procedure and technique.

## 2023-02-13 NOTE — Progress Notes (Signed)
Safety precautions to be maintained throughout the outpatient stay will include: orient to surroundings, keep bed in low position, maintain call bell within reach at all times, provide assistance with transfer out of bed and ambulation.  

## 2023-02-14 ENCOUNTER — Telehealth: Payer: Self-pay | Admitting: *Deleted

## 2023-02-14 NOTE — Telephone Encounter (Signed)
Spoke with patient's wife, she reports no problems post procedure. °

## 2023-02-19 ENCOUNTER — Encounter
Payer: No Typology Code available for payment source | Admitting: Student in an Organized Health Care Education/Training Program

## 2023-03-13 ENCOUNTER — Ambulatory Visit
Payer: No Typology Code available for payment source | Attending: Student in an Organized Health Care Education/Training Program | Admitting: Student in an Organized Health Care Education/Training Program

## 2023-03-13 ENCOUNTER — Encounter: Payer: Self-pay | Admitting: Student in an Organized Health Care Education/Training Program

## 2023-03-13 VITALS — BP 92/54 | HR 66 | Temp 98.1°F | Resp 16 | Ht 69.0 in | Wt 179.0 lb

## 2023-03-13 DIAGNOSIS — M47816 Spondylosis without myelopathy or radiculopathy, lumbar region: Secondary | ICD-10-CM | POA: Insufficient documentation

## 2023-03-13 DIAGNOSIS — G894 Chronic pain syndrome: Secondary | ICD-10-CM | POA: Diagnosis not present

## 2023-03-13 MED ORDER — OXYCODONE-ACETAMINOPHEN 5-325 MG PO TABS: 1.0000 | ORAL_TABLET | Freq: Two times a day (BID) | ORAL | 0 refills | Status: AC | PRN

## 2023-03-13 NOTE — Progress Notes (Signed)
Nursing Pain Medication Assessment:  Safety precautions to be maintained throughout the outpatient stay will include: orient to surroundings, keep bed in low position, maintain call bell within reach at all times, provide assistance with transfer out of bed and ambulation.  Medication Inspection Compliance: Pill count conducted under aseptic conditions, in front of the patient. Neither the pills nor the bottle was removed from the patient's sight at any time. Once count was completed pills were immediately returned to the patient in their original bottle.  Medication: Oxycodone/APAP Pill/Patch Count:  2 of 30 pills remain Pill/Patch Appearance: Markings consistent with prescribed medication Bottle Appearance: Standard pharmacy container. Clearly labeled. Filled Date: 05 / 14 / 2024 Last Medication intake:  Today

## 2023-03-13 NOTE — Progress Notes (Signed)
PROVIDER NOTE: Information contained herein reflects review and annotations entered in association with encounter. Interpretation of such information and data should be left to medically-trained personnel. Information provided to patient can be located elsewhere in the medical record under "Patient Instructions". Document created using STT-dictation technology, any transcriptional errors that may result from process are unintentional.    Patient: Danny Marquez  Service Category: E/M  Provider: Edward Jolly, MD  DOB: July 19, 1946  DOS: 03/13/2023  Referring Provider: [facility]  MRN: [id number]  Specialty: Interventional Pain Management  [facility]P: [facility]  Type: Established Patient  Setting: Ambulatory outpatient    Location: Office  Delivery: Face-to-face     HPI  Mr. Ninos Wildeman, a 77 y.o. year old male, is here today because of his Lumbar facet arthropathy [M47.816]. Mr. Estis primary complain today is Back Pain (Left lumbar better after procedure right side is very bad. )  Pertinent problems: Mr. Corris has History of myocardial infarction; Spinal stenosis, lumbar region, with neurogenic claudication; History of lumbar fusion; Lumbar radiculopathy; S/P fusion of sacroiliac joint (right); Chronic pain syndrome; Chronic right SI joint pain; and Lumbar facet arthropathy on their pertinent problem list. Pain Assessment: Severity of Chronic pain is reported as a 7 /10. Location: Back Lower, Right/denies. Onset: More than a month ago. Quality: Constant, Discomfort, Throbbing. Timing: Constant. Modifying factor(s): pain medications help about 3 hours.  since the flare on the right side he is having to take daily pain tablet. procedure helped the left side.. Vitals:  height is 5\' 9"  (1.753 m) and weight is 179 lb (81.2 kg). His temporal temperature is 98.1 F (36.7 C). His blood pressure is 92/54 (abnormal) and his pulse is 66. His respiration is 16 and oxygen saturation is 96%.   BMI: Estimated body mass index is 26.43 kg/m as calculated from the following:   Height as of this encounter: 5\' 9"  (1.753 m).   Weight as of this encounter: 179 lb (81.2 kg). Last encounter: 01/22/2023. Last procedure: 02/13/2023.  Reason for encounter: both, medication management and post-procedure evaluation and assessment.   Patient states that his lower back is doing much better after his previous left L1-L2-L3 lumbar facet medial branch nerve block.  He states that he has no pain there.  He is having increased episodic pain overlying his right lumbar spine.  He has had 2 positive diagnostic lumbar facet medial branch nerve blocks on the right side.  We discussed a right L1, L2, L3 RFA.  Risk and benefits reviewed and patient would like to proceed with that.  He is also finding analgesic benefit with oxycodone which he takes 5 mg daily.  He states that he is having difficulty managing his pain in the evening and is requesting a 5 mg of oxycodone that he can take in the evening for pain management when his pain is severe.  This is reasonable.  Pharmacotherapy Assessment  Analgesic: Percocet 5 mg BID prn  Monitoring: [place] PMP: PDMP reviewed during this encounter.       Pharmacotherapy: No side-effects or adverse reactions reported. Compliance: No problems identified. Effectiveness: Clinically acceptable.  UDS:  Summary  Date Value Ref Range Status  01/22/2023 Note  Final    Comment:    ==================================================================== Compliance Drug Analysis, Ur ==================================================================== Test                             Result  Flag       Units  Drug Present and Declared for Prescription Verification   Pregabalin                     PRESENT      EXPECTED   Sertraline                     PRESENT      EXPECTED   Desmethylsertraline            PRESENT      EXPECTED    Desmethylsertraline is an expected metabolite of  sertraline.    Trazodone                      PRESENT      EXPECTED   1,3 chlorophenyl piperazine    PRESENT      EXPECTED    1,3-chlorophenyl piperazine is an expected metabolite of trazodone.    Acetaminophen                  PRESENT      EXPECTED   Metoprolol                     PRESENT      EXPECTED  Drug Present not Declared for Prescription Verification   Guaifenesin                    PRESENT      UNEXPECTED    Guaifenesin may be administered as an over-the-counter or    prescription drug; it may also be present as a breakdown product of    methocarbamol.  Drug Absent but Declared for Prescription Verification   Oxycodone                      Not Detected UNEXPECTED ng/mg creat   Salicylate                     Not Detected UNEXPECTED    Aspirin, as indicated in the declared medication list, is not always    detected even when used as directed.  ==================================================================== Test                      Result    Flag   Units      Ref Range   Creatinine              58               mg/dL      >=24 ==================================================================== Declared Medications:  The flagging and interpretation on this report are based on the  following declared medications.  Unexpected results may arise from  inaccuracies in the declared medications.   **Note: The testing scope of this panel includes these medications:   Metoprolol (Lopressor)  Oxycodone (Oxy-IR)  Oxycodone (Percocet)  Pregabalin (Lyrica)  Sertraline (Zoloft)  Trazodone   **Note: The testing scope of this panel does not include small to  moderate amounts of these reported medications:   Acetaminophen (Percocet)  Aspirin   **Note: The testing scope of this panel does not include the  following reported medications:   Allopurinol (Zyloprim)  Amiodarone (Pacerone)  Donepezil  Empagliflozin (Jardiance)  Losartan (Cozaar)  Ranolazine (Ranexa)   Rosuvastatin (Crestor)  Torsemide (Demadex) ==================================================================== For clinical consultation, please call [phone]. ====================================================================       Post-procedure  evaluation   Procedure: Lumbar Facet, Medial Branch Block(s) #1  Laterality: Left  Level: L1, L2, and L3 Medial Branch Level(s). Injecting these levels blocks the L1-2 and L2-3 lumbar facet joints. Imaging: Fluoroscopic guidance         Anesthesia: Local anesthesia (1-2% Lidocaine) DOS: 02/13/2023 Performed by: Edward Jolly, MD  Primary Purpose: Diagnostic/Therapeutic Indications: Low back pain severe enough to impact quality of life or function. 1. Lumbar facet arthropathy   2. Lumbar spondylosis    NAS-11 Pain score:   Pre-procedure: 7 /10   Post-procedure: 7 /10      Effectiveness:  Initial hour after procedure: 100 %  Subsequent 4-6 hours post-procedure: 80 %  Analgesia past initial 6 hours: 80 %  Ongoing improvement:  Analgesic:  90% Function: Mr. Langtry reports improvement in function ROM: Mr. Fugett reports improvement in ROM   ROS  Constitutional: Denies any fever or chills Gastrointestinal: No reported hemesis, hematochezia, vomiting, or acute GI distress Musculoskeletal:  right low back pain Neurological: No reported episodes of acute onset apraxia, aphasia, dysarthria, agnosia, amnesia, paralysis, loss of coordination, or loss of consciousness  Medication Review  Donepezil HCl, allopurinol, amiodarone, aspirin EC, empagliflozin, ferrous gluconate, losartan, metoprolol tartrate, oxyCODONE-acetaminophen, pantoprazole, pregabalin, ranolazine, rosuvastatin, sertraline, torsemide, and traZODone HCl  History Review  Allergy: Mr. Gericke is allergic to codeine. Drug: Mr. Bamber  reports that he does not currently use drugs. Alcohol:  reports that he does not currently use alcohol after a past usage of about  24.0 standard drinks of alcohol per week. Tobacco:  reports that he has quit smoking. His smoking use included cigarettes. He has never used smokeless tobacco. Social: Mr. Lardner  reports that he has quit smoking. His smoking use included cigarettes. He has never used smokeless tobacco. He reports that he does not currently use alcohol after a past usage of about 24.0 standard drinks of alcohol per week. He reports that he does not currently use drugs. Medical:  has a past medical history of A-fib (HCC), CHF (congestive heart failure) (HCC), Chronic kidney disease, Hypertension, Myocardial infarction (HCC), and Solitary kidney. Surgical: Mr. Hepper  has a past surgical history that includes Coronary artery bypass graft; Back surgery; CARDIOVERSION (N/A, 01/23/2017); CARDIOVERSION (N/A, 02/01/2017); Joint replacement; Colonoscopy with propofol (N/A, 03/05/2019); and Esophagogastroduodenoscopy (egd) with propofol (N/A, 03/05/2019). Family: family history includes Heart attack in his father.  Laboratory Chemistry Profile   Renal Lab Results  Component Value Date   BUN 24 (H) 03/03/2022   CREATININE 1.45 (H) 03/03/2022   GFRAA >60 08/06/2019   GFRNONAA 50 (L) 03/03/2022    Hepatic Lab Results  Component Value Date   AST 45 (H) 03/03/2022   ALT 17 03/03/2022   ALBUMIN 3.1 (L) 03/03/2022   ALKPHOS 85 03/03/2022   LIPASE 356 (H) 08/06/2019   AMMONIA 29 08/06/2019    Electrolytes Lab Results  Component Value Date   NA 139 03/03/2022   K 4.3 03/03/2022   CL 110 03/03/2022   CALCIUM 9.8 03/03/2022   MG 2.1 05/20/2021    Bone No results found for: "VD25OH", "VD125OH2TOT", "WU9811BJ4", "NW2956OZ3", "25OHVITD1", "25OHVITD2", "25OHVITD3", "TESTOFREE", "TESTOSTERONE"  Inflammation (CRP: Acute Phase) (ESR: Chronic Phase) Lab Results  Component Value Date   LATICACIDVEN 1.1 08/06/2019         Note: Above Lab results reviewed.  Recent Imaging Review  DG PAIN CLINIC C-ARM 1-60 MIN NO  REPORT Fluoro was used, but no Radiologist interpretation will be provided.  Please refer to "NOTES"  tab for provider progress note. Note: Reviewed        Physical Exam  General appearance: Well nourished, well developed, and well hydrated. In no apparent acute distress Mental status: Alert, oriented x 3 (person, place, & time)       Respiratory: No evidence of acute respiratory distress Eyes: PERLA Vitals: BP (!) 92/54 (BP Location: Right Arm, Patient Position: Sitting, Cuff Size: Normal)   Pulse 66   Temp 98.1 F (36.7 C) (Temporal)   Resp 16   Ht 5\' 9"  (1.753 m)   Wt 179 lb (81.2 kg)   SpO2 96%   BMI 26.43 kg/m  BMI: Estimated body mass index is 26.43 kg/m as calculated from the following:   Height as of this encounter: 5\' 9"  (1.753 m).   Weight as of this encounter: 179 lb (81.2 kg). Ideal: Ideal body weight: 70.7 kg (155 lb 13.8 oz) Adjusted ideal body weight: 74.9 kg (165 lb 1.9 oz)  Right low back pain, axial, worse with facet loading  Assessment   Diagnosis Status  1. Lumbar facet arthropathy   2. Lumbar spondylosis   3. Chronic pain syndrome    Persistent Persistent Controlled    Plan of Care   Mr. Torren Falsetti has a current medication list which includes the following long-term medication(s): allopurinol, donepezil hcl, ferrous gluconate, pregabalin, ranolazine, rosuvastatin, sertraline, torsemide, trazodone hcl, and amiodarone.  Pharmacotherapy (Medications Ordered): Meds ordered this encounter  Medications   oxyCODONE-acetaminophen (PERCOCET) 5-325 MG tablet    Sig: Take 1 tablet by mouth every 12 (twelve) hours as needed for severe pain. Must last 30 days.    Dispense:  60 tablet    Refill:  0    Chronic Pain: STOP Act (Not applicable) Fill 1 day early if closed on refill date. Avoid benzodiazepines within 8 hours of opioids   Orders:  Orders Placed This Encounter  Procedures   Radiofrequency,Lumbar    Standing Status:   Future     Standing Expiration Date:   06/13/2023    Scheduling Instructions:     Side(s): RIGHT     Level(s): L1, L2, L3  Medial Branch Nerve(s)     Sedation: With Sedation     Scheduling Timeframe: As soon as pre-approved    Order Specific Question:   Where will this procedure be performed?    Answer:   [facility]   Follow-up plan:   Return in about 26 days (around 04/08/2023) for Right L1, 2, 3, RFA, in clinic IV Versed.     Recent Visits Date Type Provider Dept  02/13/23 Procedure visit Edward Jolly, MD [facility]  01/22/23 Office Visit Edward Jolly, MD [facility]  01/16/23 Procedure visit Edward Jolly, MD [facility]  12/25/22 Office Visit Edward Jolly, MD [facility]  Showing recent visits within past 90 days and meeting all other requirements Today's Visits Date Type Provider Dept  03/13/23 Office Visit Edward Jolly, MD [facility]  Showing today's visits and meeting all other requirements Future Appointments No visits were found meeting these conditions. Showing future appointments within next 90 days and meeting all other requirements  I discussed the assessment and treatment plan with the patient. The patient was provided an opportunity to ask questions and all were answered. The patient agreed with the plan and demonstrated an understanding of the instructions.  Patient advised to call back or seek an in-person evaluation if the symptoms or condition worsens.  Duration of  encounter: .  Total time on encounter, as per AMA guidelines included both the face-to-face and non-face-to-face time personally spent by the physician and/or other qualified health care professional(s) on the day of the encounter (includes time in activities that require the physician or other qualified health care professional and does not include time in activities normally performed by clinical staff). Physician's time may include the  following activities when performed: Preparing to see the patient (e.g., pre-charting review of records, searching for previously ordered imaging, lab work, and nerve conduction tests) Review of prior analgesic pharmacotherapies. Reviewing PMP Interpreting ordered tests (e.g., lab work, imaging, nerve conduction tests) Performing post-procedure evaluations, including interpretation of diagnostic procedures Obtaining and/or reviewing separately obtained history Performing a medically appropriate examination and/or evaluation Counseling and educating the patient/family/caregiver Ordering medications, tests, or procedures Referring and communicating with other health care professionals (when not separately reported) Documenting clinical information in the electronic or other health record Independently interpreting results (not separately reported) and communicating results to the patient/ family/caregiver Care coordination (not separately reported)  Note by: Edward Jolly, MD Date: 03/13/2023; Time: 2:52 PM

## 2023-03-13 NOTE — Patient Instructions (Signed)
______________________________________________________________________  Preparing for your procedure  Appointments: If you think you may not be able to keep your appointment, call 24-48 hours in advance to cancel. We need time to make it available to others.  During your procedure appointment there will be: No Prescription Refills. No disability issues to discussed. No medication changes or discussions.  Instructions: Food intake: Avoid eating anything solid for at least 8 hours prior to your procedure. Clear liquid intake: You may take clear liquids such as water up to 2 hours prior to your procedure. (No carbonated drinks. No soda.) Transportation: Unless otherwise stated by your physician, bring a driver. Morning Medicines: Except for blood thinners, take all of your other morning medications with a sip of water. Make sure to take your heart and blood pressure medicines. If your blood pressure's lower number is above 100, the case will be rescheduled. Blood thinners: Make sure to stop your blood thinners as instructed.  If you take a blood thinner, but were not instructed to stop it, call our office [phone] and ask to talk to a nurse. Not stopping a blood thinner prior to certain procedures could lead to serious complications. Diabetics on insulin: Notify the staff so that you can be scheduled 1st case in the morning. If your diabetes requires high dose insulin, take only  of your normal insulin dose the morning of the procedure and notify the staff that you have done so. Preventing infections: Shower with an antibacterial soap the morning of your procedure.  Build-up your immune system: Take 1000 mg of Vitamin C with every meal (3 times a day) the day prior to your procedure. Antibiotics: Inform the nursing staff if you are taking any antibiotics or if you have any conditions that may require antibiotics prior to procedures. (Example: recent joint implants)   Pregnancy: If you are  pregnant make sure to notify the nursing staff. Not doing so may result in injury to the fetus, including death.  Sickness: If you have a cold, fever, or any active infections, call and cancel or reschedule your procedure. Receiving steroids while having an infection may result in complications. Arrival: You must be in the facility at least 30 minutes prior to your scheduled procedure. Tardiness: Your scheduled time is also the cutoff time. If you do not arrive at least 15 minutes prior to your procedure, you will be rescheduled.  Children: Do not bring any children with you. Make arrangements to keep them home. Dress appropriately: There is always a possibility that your clothing may get soiled. Avoid long dresses. Valuables: Do not bring any jewelry or valuables.  Reasons to call and reschedule or cancel your procedure: (Following these recommendations will minimize the risk of a serious complication.) Surgeries: Avoid having procedures within 2 weeks of any surgery. (Avoid for 2 weeks before or after any surgery). Flu Shots: Avoid having procedures within 2 weeks of a flu shots or . (Avoid for 2 weeks before or after immunizations). Barium: Avoid having a procedure within 7-10 days after having had a radiological study involving the use of radiological contrast. (Myelograms, Barium swallow or enema study). Heart attacks: Avoid any elective procedures or surgeries for the initial 6 months after a "Myocardial Infarction" (Heart Attack). Blood thinners: It is imperative that you stop these medications before procedures. Let us know if you if you take any blood thinner.  Infection: Avoid procedures during or within two weeks of an infection (including chest colds or gastrointestinal problems). Symptoms associated with infections   include: Localized redness, fever, chills, night sweats or profuse sweating, burning sensation when voiding, cough, congestion, stuffiness, runny nose, sore throat, diarrhea,  nausea, vomiting, cold or Flu symptoms, recent or current infections. It is specially important if the infection is over the area that we intend to treat. Heart and lung problems: Symptoms that may suggest an active cardiopulmonary problem include: cough, chest pain, breathing difficulties or shortness of breath, dizziness, ankle swelling, uncontrolled high or unusually low blood pressure, and/or palpitations. If you are experiencing any of these symptoms, cancel your procedure and contact your primary care physician for an evaluation.  Remember:  Regular Business hours are:  Monday to Thursday 8:00 AM to 4:00 PM  Provider's Schedule: Milinda Pointer, MD:  Procedure days: Tuesday and Thursday 7:30 AM to 4:00 PM  Gillis Santa, MD:  Procedure days: Monday and Wednesday 7:30 AM to 4:00 PM  ______________________________________________________________________    ____________________________________________________________________________________________  General Risks and Possible Complications  Patient Responsibilities: It is important that you read this as it is part of your informed consent. It is our duty to inform you of the risks and possible complications associated with treatments offered to you. It is your responsibility as a patient to read this and to ask questions about anything that is not clear or that you believe was not covered in this document.  Patient's Rights: You have the right to refuse treatment. You also have the right to change your mind, even after initially having agreed to have the treatment done. However, under this last option, if you wait until the last second to change your mind, you may be charged for the materials used up to that point.  Introduction: Medicine is not an Chief Strategy Officer. Everything in Medicine, including the lack of treatment(s), carries the potential for danger, harm, or loss (which is by definition: Risk). In Medicine, a complication is a  secondary problem, condition, or disease that can aggravate an already existing one. All treatments carry the risk of possible complications. The fact that a side effects or complications occurs, does not imply that the treatment was conducted incorrectly. It must be clearly understood that these can happen even when everything is done following the highest safety standards.  No treatment: You can choose not to proceed with the proposed treatment alternative. The "PRO(s)" would include: avoiding the risk of complications associated with the therapy. The "CON(s)" would include: not getting any of the treatment benefits. These benefits fall under one of three categories: diagnostic; therapeutic; and/or palliative. Diagnostic benefits include: getting information which can ultimately lead to improvement of the disease or symptom(s). Therapeutic benefits are those associated with the successful treatment of the disease. Finally, palliative benefits are those related to the decrease of the primary symptoms, without necessarily curing the condition (example: decreasing the pain from a flare-up of a chronic condition, such as incurable terminal cancer).  General Risks and Complications: These are associated to most interventional treatments. They can occur alone, or in combination. They fall under one of the following six (6) categories: no benefit or worsening of symptoms; bleeding; infection; nerve damage; allergic reactions; and/or death. No benefits or worsening of symptoms: In Medicine there are no guarantees, only probabilities. No healthcare provider can ever guarantee that a medical treatment will work, they can only state the probability that it may. Furthermore, there is always the possibility that the condition may worsen, either directly, or indirectly, as a consequence of the treatment. Bleeding: This is more common if the patient is  taking a blood thinner, either prescription or over the counter  (example: Goody Powders, Fish oil, Aspirin, Garlic, etc.), or if suffering a condition associated with impaired coagulation (example: Hemophilia, cirrhosis of the liver, low platelet counts, etc.). However, even if you do not have one on these, it can still happen. If you have any of these conditions, or take one of these drugs, make sure to notify your treating physician. Infection: This is more common in patients with a compromised immune system, either due to disease (example: diabetes, cancer, human immunodeficiency virus [HIV], etc.), or due to medications or treatments (example: therapies used to treat cancer and rheumatological diseases). However, even if you do not have one on these, it can still happen. If you have any of these conditions, or take one of these drugs, make sure to notify your treating physician. Nerve Damage: This is more common when the treatment is an invasive one, but it can also happen with the use of medications, such as those used in the treatment of cancer. The damage can occur to small secondary nerves, or to large primary ones, such as those in the spinal cord and brain. This damage may be temporary or permanent and it may lead to impairments that can range from temporary numbness to permanent paralysis and/or brain death. Allergic Reactions: Any time a substance or material comes in contact with our body, there is the possibility of an allergic reaction. These can range from a mild skin rash (contact dermatitis) to a severe systemic reaction (anaphylactic reaction), which can result in death. Death: In general, any medical intervention can result in death, most of the time due to an unforeseen complication. ____________________________________________________________________________________________   Radiofrequency Ablation Radiofrequency ablation is a procedure that is performed to relieve pain. The procedure is often used for back, neck, or arm pain. Radiofrequency  ablation involves the use of a machine that creates radio waves to make heat. During the procedure, the heat is applied to the nerve that carries the pain signal. The heat damages the nerve and interferes with the pain signal. Pain relief usually starts about 2 weeks after the procedure and lasts for 6 months to 1 year. Tell a health care provider about: Any allergies you have. All medicines you are taking, including vitamins, herbs, eye drops, creams, and over-the-counter medicines. Any problems you or family members have had with anesthetic medicines. Any bleeding problems you have. Any surgeries you have had. Any medical conditions you have. Whether you are pregnant or may be pregnant. What are the risks? Generally, this is a safe procedure. However, problems may occur, including: Pain or soreness at the injection site. Allergic reaction to medicines given during the procedure. Bleeding. Infection at the injection site. Damage to nerves or blood vessels. What happens before the procedure? When to stop eating and drinking Follow instructions from your health care provider about what you may eat and drink before your procedure. These may include: 8 hours before the procedure Stop eating most foods. Do not eat meat, fried foods, or fatty foods. Eat only light foods, such as toast or crackers. All liquids are okay except energy drinks and alcohol. 6 hours before the procedure Stop eating. Drink only clear liquids, such as water, clear fruit juice, black coffee, plain tea, and sports drinks. Do not drink energy drinks or alcohol. 2 hours before the procedure Stop drinking all liquids. You may be allowed to take medicine with small sips of water. If you do not follow your health  care provider's instructions, your procedure may be delayed or canceled. Medicines Ask your health care provider about: Changing or stopping your regular medicines. This is especially important if you are taking  diabetes medicines or blood thinners. Taking medicines such as aspirin and ibuprofen. These medicines can thin your blood. Do not take these medicines unless your health care provider tells you to take them. Taking over-the-counter medicines, vitamins, herbs, and supplements. General instructions Ask your health care provider what steps will be taken to help prevent infection. These steps may include: Removing hair at the procedure site. Washing skin with a germ-killing soap. Taking antibiotic medicine. If you will be going home right after the procedure, plan to have a responsible adult: Take you home from the hospital or clinic. You will not be allowed to drive. Care for you for the time you are told. What happens during the procedure?  You will be awake during the procedure. You will need to be able to talk with the health care provider during the procedure. An IV will be inserted into one of your veins. You will be given one or more of the following: A medicine to help you relax (sedative). A medicine to numb the area (local anesthetic). Your health care provider will insert a radiofrequency needle into the area to be treated. This is done with the help of fluoroscopy. A wire that carries the radio waves (electrode) will be put through the radiofrequency needle. An electrical pulse will be sent through the electrode to verify the correct nerve that is causing your pain. You will feel a tingling sensation, and you may have muscle twitching. The tissue around the needle tip will be heated by an electric current that comes from the radiofrequency machine. This will numb the nerves. The needle will be removed. A bandage (dressing) will be put on the insertion area. The procedure may vary among health care providers and hospitals. What happens after the procedure? Your blood pressure, heart rate, breathing rate, and blood oxygen level will be monitored until you leave the hospital or  clinic. Return to your normal activities as told by your health care provider. Ask your health care provider what activities are safe for you. If you were given a sedative during the procedure, it can affect you for several hours. Do not drive or operate machinery until your health care provider says that it is safe. Summary Radiofrequency ablation is a procedure that is performed to relieve pain. The procedure is often used for back, neck, or arm pain. Radiofrequency ablation involves the use of a machine that creates radio waves to make heat. Plan to have a responsible adult take you home from the hospital or clinic. Do not drive or operate machinery until your health care provider says that it is safe. Return to your normal activities as told by your health care provider. Ask your health care provider what activities are safe for you. This information is not intended to replace advice given to you by your health care provider. Make sure you discuss any questions you have with your health care provider. Document Revised: 02/14/2021 Document Reviewed: 02/14/2021 [employer] Patient Education  2024 [employer].

## 2023-04-17 ENCOUNTER — Encounter: Payer: Self-pay | Admitting: Student in an Organized Health Care Education/Training Program

## 2023-04-17 ENCOUNTER — Ambulatory Visit
Admission: RE | Admit: 2023-04-17 | Discharge: 2023-04-17 | Disposition: A | Discharge status: Home / Self Care | Payer: Medicare Other | Source: Ambulatory Visit | Attending: Student in an Organized Health Care Education/Training Program | Admitting: Student in an Organized Health Care Education/Training Program

## 2023-04-17 ENCOUNTER — Ambulatory Visit
Payer: No Typology Code available for payment source | Attending: Student in an Organized Health Care Education/Training Program | Admitting: Student in an Organized Health Care Education/Training Program

## 2023-04-17 VITALS — BP 136/79 | HR 60 | Temp 98.1°F | Resp 15 | Ht 69.0 in | Wt 178.0 lb

## 2023-04-17 DIAGNOSIS — G894 Chronic pain syndrome: Secondary | ICD-10-CM | POA: Diagnosis present

## 2023-04-17 DIAGNOSIS — M47816 Spondylosis without myelopathy or radiculopathy, lumbar region: Secondary | ICD-10-CM | POA: Diagnosis not present

## 2023-04-17 MED ORDER — MIDAZOLAM HCL 2 MG/2ML IJ SOLN
0.5000 mg | Freq: Once | INTRAMUSCULAR | Status: AC
  Administered 2023-04-17: 2 mg via INTRAVENOUS

## 2023-04-17 MED ORDER — IOHEXOL 180 MG/ML  SOLN: 10.0000 mL | Freq: Once | INTRAMUSCULAR | Status: DC

## 2023-04-17 MED ORDER — ROPIVACAINE HCL 2 MG/ML IJ SOLN
INTRAMUSCULAR | Status: AC
  Filled 2023-04-17: qty 20

## 2023-04-17 MED ORDER — LIDOCAINE HCL 2 % IJ SOLN
20.0000 mL | Freq: Once | INTRAMUSCULAR | Status: AC
  Administered 2023-04-17: 400 mg

## 2023-04-17 MED ORDER — DEXAMETHASONE SODIUM PHOSPHATE 10 MG/ML IJ SOLN
INTRAMUSCULAR | Status: AC
  Filled 2023-04-17: qty 1

## 2023-04-17 MED ORDER — LIDOCAINE HCL 2 % IJ SOLN
INTRAMUSCULAR | Status: AC
  Filled 2023-04-17: qty 20

## 2023-04-17 MED ORDER — MIDAZOLAM HCL 2 MG/2ML IJ SOLN
INTRAMUSCULAR | Status: AC
  Filled 2023-04-17: qty 2

## 2023-04-17 MED ORDER — LACTATED RINGERS IV SOLN: Freq: Once | INTRAVENOUS | Status: AC

## 2023-04-17 MED ORDER — ROPIVACAINE HCL 2 MG/ML IJ SOLN
9.0000 mL | Freq: Once | INTRAMUSCULAR | Status: AC
  Administered 2023-04-17: 9 mL via PERINEURAL

## 2023-04-17 MED ORDER — DEXAMETHASONE SODIUM PHOSPHATE 10 MG/ML IJ SOLN
10.0000 mg | Freq: Once | INTRAMUSCULAR | Status: AC
  Administered 2023-04-17: 10 mg

## 2023-04-17 NOTE — Progress Notes (Signed)
Safety precautions to be maintained throughout the outpatient stay will include: orient to surroundings, keep bed in low position, maintain call bell within reach at all times, provide assistance with transfer out of bed and ambulation.  

## 2023-04-17 NOTE — Progress Notes (Signed)
PROVIDER NOTE: Interpretation of information contained herein should be left to medically-trained personnel. Specific patient instructions are provided elsewhere under "Patient Instructions" section of medical record. This document was created in part using STT-dictation technology, any transcriptional errors that may result from this process are unintentional.  Patient: Danny Marquez Type: Established DOB: 01-05-1946 MRN: [id number] [facility]  Service: Procedure DOS: 04/17/2023 Setting: Ambulatory Location: Ambulatory outpatient facility Delivery: Face-to-face Provider: Edward Jolly, MD Specialty: Interventional Pain Management Specialty designation: 09 Location: Outpatient facility Ref. Prov.: Edward Jolly, MD       Interventional Therapy   Procedure: Lumbar Facet, Medial Branch Radiofrequency Ablation (RFA) #1  Laterality: Right (-RT)  Level: L1, L2, and L3 Medial Branch Level(s). These levels will denervate the L1-2 and L2-3 lumbar facet joints.  Imaging: Fluoroscopy-guided         Anesthesia: Local anesthesia (1-2% Lidocaine) Anxiolysis: 2 mg IV Versed DOS: 04/17/2023  Performed by: Edward Jolly, MD  Purpose: Therapeutic/Palliative Indications: Low back pain severe enough to impact quality of life or function. Indications: 1. Lumbar facet arthropathy   2. Lumbar spondylosis   3. Chronic pain syndrome    Mr. Lalich has been dealing with the above chronic pain for longer than three months and has either failed to respond, was unable to tolerate, or simply did not get enough benefit from other more conservative therapies including, but not limited to: 1. Over-the-counter medications 2. Anti-inflammatory medications 3. Muscle relaxants 4. Membrane stabilizers 5. Opioids 6. Physical therapy and/or chiropractic manipulation 7. Modalities (Heat, ice, etc.) 8. Invasive techniques such as nerve blocks. Mr. Heon has attained more than 50% relief of the pain  from a series of diagnostic injections conducted in separate occasions.  Pain Score: Pre-procedure: 6 /10 Post-procedure: 0-No pain/10     Position / Prep / Materials:  Position: Prone  Prep solution: DuraPrep (Iodine Povacrylex [0.7% available iodine] and Isopropyl Alcohol, 74% w/w) Prep Area: Entire Lumbosacral Region (Lower back from mid-thoracic region to end of tailbone and from flank to flank.) Materials:  Tray: RFA (Radiofrequency) tray Needle(s):  Type: RFA (Teflon-coated radiofrequency ablation needles) Gauge (G): 22  Length: Regular (10cm) Qty: 3      H&P (Pre-op Assessment):  Mr. Habel is a 77 y.o. (year old), male patient, seen today for interventional treatment. He  has a past surgical history that includes Coronary artery bypass graft; Back surgery; CARDIOVERSION (N/A, 01/23/2017); CARDIOVERSION (N/A, 02/01/2017); Joint replacement; Colonoscopy with propofol (N/A, 03/05/2019); and Esophagogastroduodenoscopy (egd) with propofol (N/A, 03/05/2019). Mr. Watton has a current medication list which includes the following prescription(s): allopurinol, aspirin ec, donepezil hcl, empagliflozin, ferrous gluconate, losartan, metoprolol tartrate, pantoprazole, pregabalin, ranolazine, rosuvastatin, sertraline, torsemide, trazodone hcl, and amiodarone, and the following Facility-Administered Medications: iohexol. His primarily concern today is the Back Pain  Initial Vital Signs:  Pulse/HCG Rate: 62ECG Heart Rate: 77 Temp: 98.1 F (36.7 C) Resp: 16 BP: 121/72 SpO2: 100 %  BMI: Estimated body mass index is 26.29 kg/m as calculated from the following:   Height as of this encounter: 5\' 9"  (1.753 m).   Weight as of this encounter: 178 lb (80.7 kg).  Risk Assessment: Allergies: Reviewed. He is allergic to codeine.  Allergy Precautions: None required Coagulopathies: Reviewed. None identified.  Blood-thinner therapy: None at this time Active Infection(s): Reviewed. None identified. Mr.  Witherell is afebrile  Site Confirmation: Mr. Bucko was asked to confirm the procedure and laterality before marking the site Procedure checklist: Completed Consent: Before the procedure and under  the influence of no sedative(s), amnesic(s), or anxiolytics, the patient was informed of the treatment options, risks and possible complications. To fulfill our ethical and legal obligations, as recommended by the American Medical Association's Code of Ethics, I have informed the patient of my clinical impression; the nature and purpose of the treatment or procedure; the risks, benefits, and possible complications of the intervention; the alternatives, including doing nothing; the risk(s) and benefit(s) of the alternative treatment(s) or procedure(s); and the risk(s) and benefit(s) of doing nothing. The patient was provided information about the general risks and possible complications associated with the procedure. These may include, but are not limited to: failure to achieve desired goals, infection, bleeding, organ or nerve damage, allergic reactions, paralysis, and death. In addition, the patient was informed of those risks and complications associated to Spine-related procedures, such as failure to decrease pain; infection (i.e.: Meningitis, epidural or intraspinal abscess); bleeding (i.e.: epidural hematoma, subarachnoid hemorrhage, or any other type of intraspinal or peri-dural bleeding); organ or nerve damage (i.e.: Any type of peripheral nerve, nerve root, or spinal cord injury) with subsequent damage to sensory, motor, and/or autonomic systems, resulting in permanent pain, numbness, and/or weakness of one or several areas of the body; allergic reactions; (i.e.: anaphylactic reaction); and/or death. Furthermore, the patient was informed of those risks and complications associated with the medications. These include, but are not limited to: allergic reactions (i.e.: anaphylactic or anaphylactoid reaction(s));  adrenal axis suppression; blood sugar elevation that in diabetics may result in ketoacidosis or comma; water retention that in patients with history of congestive heart failure may result in shortness of breath, pulmonary edema, and decompensation with resultant heart failure; weight gain; swelling or edema; medication-induced neural toxicity; particulate matter embolism and blood vessel occlusion with resultant organ, and/or nervous system infarction; and/or aseptic necrosis of one or more joints. Finally, the patient was informed that Medicine is not an exact science; therefore, there is also the possibility of unforeseen or unpredictable risks and/or possible complications that may result in a catastrophic outcome. The patient indicated having understood very clearly. We have given the patient no guarantees and we have made no promises. Enough time was given to the patient to ask questions, all of which were answered to the patient's satisfaction. Mr. Kaseman has indicated that he wanted to continue with the procedure. Attestation: I, the ordering provider, attest that I have discussed with the patient the benefits, risks, side-effects, alternatives, likelihood of achieving goals, and potential problems during recovery for the procedure that I have provided informed consent. Date  Time: 04/17/2023  7:55 AM   Pre-Procedure Preparation:  Monitoring: As per clinic protocol. Respiration, ETCO2, SpO2, BP, heart rate and rhythm monitor placed and checked for adequate function Safety Precautions: Patient was assessed for positional comfort and pressure points before starting the procedure. Time-out: I initiated and conducted the "Time-out" before starting the procedure, as per protocol. The patient was asked to participate by confirming the accuracy of the "Time Out" information. Verification of the correct person, site, and procedure were performed and confirmed by me, the nursing staff, and the patient.  "Time-out" conducted as per Joint Commission's Universal Protocol (UP.01.01.01). Time: 0826 Start Time: 0826 hrs.  Description of Procedure:          Laterality: See above. Levels:  See above. Safety Precautions: Aspiration looking for blood return was conducted prior to all injections. At no point did we inject any substances, as a needle was being advanced. Before injecting, the patient was  told to immediately notify me if he was experiencing any new onset of "ringing in the ears, or metallic taste in the mouth". No attempts were made at seeking any paresthesias. Safe injection practices and needle disposal techniques used. Medications properly checked for expiration dates. SDV (single dose vial) medications used. After the completion of the procedure, all disposable equipment used was discarded in the proper designated medical waste containers. Local Anesthesia: Protocol guidelines were followed. The patient was positioned over the fluoroscopy table. The area was prepped in the usual manner. The time-out was completed. The target area was identified using fluoroscopy. A 12-in long, straight, sterile hemostat was used with fluoroscopic guidance to locate the targets for each level blocked. Once located, the skin was marked with an approved surgical skin marker. Once all sites were marked, the skin (epidermis, dermis, and hypodermis), as well as deeper tissues (fat, connective tissue and muscle) were infiltrated with a small amount of a short-acting local anesthetic, loaded on a 10cc syringe with a 25G, 1.5-in  Needle. An appropriate amount of time was allowed for local anesthetics to take effect before proceeding to the next step. Technical description of process:  Radiofrequency Ablation (RFA) L1 Medial Branch Nerve RFA: The target area for the L1 medial branch is at the junction of the postero-lateral aspect of the superior articular process and the superior, posterior, and medial edge of the  transverse process of L2. Under fluoroscopic guidance, a Radiofrequency needle was inserted until contact was made with os over the superior postero-lateral aspect of the pedicular shadow (target area). Sensory and motor testing was conducted to properly adjust the position of the needle. Once satisfactory placement of the needle was achieved, the numbing solution was slowly injected after negative aspiration for blood. 2.0 mL of the nerve block solution was injected without difficulty or complication. After waiting for at least 3 minutes, the ablation was performed. Once completed, the needle was removed intact. L2 Medial Branch Nerve RFA: The target area for the L2 medial branch is at the junction of the postero-lateral aspect of the superior articular process and the superior, posterior, and medial edge of the transverse process of L3. Under fluoroscopic guidance, a Radiofrequency needle was inserted until contact was made with os over the superior postero-lateral aspect of the pedicular shadow (target area). Sensory and motor testing was conducted to properly adjust the position of the needle. Once satisfactory placement of the needle was achieved, the numbing solution was slowly injected after negative aspiration for blood. 2.0 mL of the nerve block solution was injected without difficulty or complication. After waiting for at least 3 minutes, the ablation was performed. Once completed, the needle was removed intact. L3 Medial Branch Nerve RFA: The target area for the L3 medial branch is at the junction of the postero-lateral aspect of the superior articular process and the superior, posterior, and medial edge of the transverse process of L4. Under fluoroscopic guidance, a Radiofrequency needle was inserted until contact was made with os over the superior postero-lateral aspect of the pedicular shadow (target area). Sensory and motor testing was conducted to properly adjust the position of the needle. Once  satisfactory placement of the needle was achieved, the numbing solution was slowly injected after negative aspiration for blood. 2.0 mL of the nerve block solution was injected without difficulty or complication. After waiting for at least 3 minutes, the ablation was performed. Once completed, the needle was removed intact.  Radiofrequency lesioning (ablation):  Radiofrequency Generator: [employer] AccurianTM [employer]  1000 RF Generator Sensory Stimulation Parameters: 50 Hz was used to locate & identify the nerve, making sure that the needle was positioned such that there was no sensory stimulation below 0.3 V or above 0.7 V. Motor Stimulation Parameters: 2 Hz was used to evaluate the motor component. Care was taken not to lesion any nerves that demonstrated motor stimulation of the lower extremities at an output of less than 2.5 times that of the sensory threshold, or a maximum of 2.0 V. Lesioning Technique Parameters: Standard Radiofrequency settings. (Not bipolar or pulsed.) Temperature Settings: 80 degrees C Lesioning time: 60 seconds Stationary intra-operative compliance: Compliant  Once the entire procedure was completed, the treated area was cleaned, making sure to leave some of the prepping solution back to take advantage of its long term bactericidal properties.    Illustration of the posterior view of the lumbar spine and the posterior neural structures. Laminae of L2 through S1 are labeled. DPRL5, dorsal primary ramus of L5; DPRS1, dorsal primary ramus of S1; DPR3, dorsal primary ramus of L3; FJ, facet (zygapophyseal) joint L3-L4; I, inferior articular process of L4; LB1, lateral branch of dorsal primary ramus of L1; IAB, inferior articular branches from L3 medial branch (supplies L4-L5 facet joint); IBP, intermediate branch plexus; MB3, medial branch of dorsal primary ramus of L3; NR3, third lumbar nerve root; S, superior articular process of L5; SAB, superior articular branches from L4 (supplies  L4-5 facet joint also); TP3, transverse process of L3.  Facet Joint Innervation (* possible contribution)  L1-2 T12, L1 (L2*)  Medial Branch  L2-3 L1, L2 (L3*)         "          "  L3-4 L2, L3 (L4*)         "          "  L4-5 L3, L4 (L5*)         "          "  L5-S1 L4, L5, S1          "          "    Vitals:   04/17/23 0830 04/17/23 0835 04/17/23 0840 04/17/23 0848  BP: 120/72 120/73 123/76 136/79  Pulse: 61 73 60   Resp: 17 16 15    Temp:      TempSrc:      SpO2: 96% 96% 98% 96%  Weight:      Height:        Start Time: 0826 hrs. End Time: 0840 hrs.  Imaging Guidance (Spinal):          Type of Imaging Technique: Fluoroscopy Guidance (Spinal) Indication(s): Assistance in needle guidance and placement for procedures requiring needle placement in or near specific anatomical locations not easily accessible without such assistance. Exposure Time: Please see nurses notes. Contrast: None used. Fluoroscopic Guidance: I was personally present during the use of fluoroscopy. "Tunnel Vision Technique" used to obtain the best possible view of the target area. Parallax error corrected before commencing the procedure. "Direction-depth-direction" technique used to introduce the needle under continuous pulsed fluoroscopy. Once target was reached, antero-posterior, oblique, and lateral fluoroscopic projection used confirm needle placement in all planes. Images permanently stored in EMR. Interpretation: No contrast injected. I personally interpreted the imaging intraoperatively. Adequate needle placement confirmed in multiple planes. Permanent images saved into the patient's record.  Antibiotic Prophylaxis:   Anti-infectives (From admission, onward)    None      Indication(s): None  identified  Post-operative Assessment:  Post-procedure Vital Signs:  Pulse/HCG Rate: 6077 Temp: 98.1 F (36.7 C) Resp: 15 BP: 136/79 SpO2: 96 %  EBL: None  Complications: No immediate post-treatment  complications observed by team, or reported by patient.  Note: The patient tolerated the entire procedure well. A repeat set of vitals were taken after the procedure and the patient was kept under observation following institutional policy, for this type of procedure. Post-procedural neurological assessment was performed, showing return to baseline, prior to discharge. The patient was provided with post-procedure discharge instructions, including a section on how to identify potential problems. Should any problems arise concerning this procedure, the patient was given instructions to immediately contact us, at any time, without hesitation. In any case, we plan to contact the patient by telephone for a follow-up status report regarding this interventional procedure.  Comments:  No additional relevant information.  Plan of Care (POC)  Orders:  Orders Placed This Encounter  Procedures   DG PAIN [facility] C-ARM 1-60 MIN NO REPORT    Intraoperative interpretation by procedural physician at [facility].    Standing Status:   Standing    Number of Occurrences:   1    Order Specific Question:   Reason for exam:    Answer:   Assistance in needle guidance and placement for procedures requiring needle placement in or near specific anatomical locations not easily accessible without such assistance.     Medications ordered for procedure: Meds ordered this encounter  Medications   iohexol (OMNIPAQUE) 180 MG/ML injection 10 mL    Must be Myelogram-compatible. If not available, you may substitute with a water-soluble, non-ionic, hypoallergenic, myelogram-compatible radiological contrast medium.   lidocaine (XYLOCAINE) 2 % (with pres) injection 400 mg   lactated ringers infusion   midazolam (VERSED) injection 0.5-2 mg    Make sure Flumazenil is available in the pyxis when using this medication. If oversedation occurs, administer 0.2 mg IV over 15 sec. If after 45 sec no response, administer 0.2 mg  again over 1 min; may repeat at 1 min intervals; not to exceed 4 doses (1 mg)   dexamethasone (DECADRON) injection 10 mg   ropivacaine (PF) 2 mg/mL (0.2%) (NAROPIN) injection 9 mL   Medications administered: We administered lidocaine, lactated ringers, midazolam, dexamethasone, and ropivacaine (PF) 2 mg/mL (0.2%).  See the medical record for exact dosing, route, and time of administration.  Follow-up plan:   Return in about 6 weeks (around 05/29/2023) for Post Procedure Evaluation, in person (discuss SCS).       Right L1,2,3 RFA 04/17/23    Recent Visits Date Type Provider Dept  03/13/23 Office Visit Edward Jolly, MD [facility]  02/13/23 Procedure visit Edward Jolly, MD [facility]  01/22/23 Office Visit Edward Jolly, MD [facility]  Showing recent visits within past 90 days and meeting all other requirements Today's Visits Date Type Provider Dept  04/17/23 Procedure visit Edward Jolly, MD [facility]  Showing today's visits and meeting all other requirements Future Appointments Date Type Provider Dept  05/29/23 Appointment Edward Jolly, MD [facility]  Showing future appointments within next 90 days and meeting all other requirements  Disposition: Discharge home  Discharge (Date  Time): 04/17/2023; 0857 hrs.   Primary Care Physician: [facility] Location[facility] Note by: Edward Jolly, MD (TTS technology used. I apologize for any typographical errors that were not detected and corrected.) Date: 04/17/2023; Time: 9:37 AM  Disclaimer:  Medicine is not an Visual merchandiser. The only guarantee in medicine is that nothing is guaranteed. It is important to note that the decision to proceed with this intervention was based on the information collected from the patient. The Data and conclusions were drawn from the patient's questionnaire, the interview, and the physical examination. Because the  information was provided in large part by the patient, it cannot be guaranteed that it has not been purposely or unconsciously manipulated. Every effort has been made to obtain as much relevant data as possible for this evaluation. It is important to note that the conclusions that lead to this procedure are derived in large part from the available data. Always take into account that the treatment will also be dependent on availability of resources and existing treatment guidelines, considered by other Pain Management Practitioners as being common knowledge and practice, at the time of the intervention. For Medico-Legal purposes, it is also important to point out that variation in procedural techniques and pharmacological choices are the acceptable norm. The indications, contraindications, technique, and results of the above procedure should only be interpreted and judged by a Board-Certified Interventional Pain Specialist with extensive familiarity and expertise in the same exact procedure and technique.

## 2023-04-18 ENCOUNTER — Telehealth: Payer: Self-pay

## 2023-04-18 NOTE — Telephone Encounter (Signed)
Post procedure follow up.  LM 

## 2023-05-09 ENCOUNTER — Ambulatory Visit
Payer: No Typology Code available for payment source | Attending: Student in an Organized Health Care Education/Training Program | Admitting: Student in an Organized Health Care Education/Training Program

## 2023-05-09 ENCOUNTER — Encounter: Payer: Self-pay | Admitting: Student in an Organized Health Care Education/Training Program

## 2023-05-09 VITALS — BP 124/70 | HR 71 | Temp 97.9°F | Resp 16 | Ht 69.0 in | Wt 182.0 lb

## 2023-05-09 DIAGNOSIS — Z981 Arthrodesis status: Secondary | ICD-10-CM | POA: Diagnosis not present

## 2023-05-09 DIAGNOSIS — M25551 Pain in right hip: Secondary | ICD-10-CM | POA: Diagnosis not present

## 2023-05-09 DIAGNOSIS — M961 Postlaminectomy syndrome, not elsewhere classified: Secondary | ICD-10-CM | POA: Diagnosis present

## 2023-05-09 DIAGNOSIS — M5134 Other intervertebral disc degeneration, thoracic region: Secondary | ICD-10-CM | POA: Insufficient documentation

## 2023-05-09 DIAGNOSIS — M5416 Radiculopathy, lumbar region: Secondary | ICD-10-CM | POA: Insufficient documentation

## 2023-05-09 DIAGNOSIS — G8929 Other chronic pain: Secondary | ICD-10-CM

## 2023-05-09 DIAGNOSIS — G894 Chronic pain syndrome: Secondary | ICD-10-CM | POA: Diagnosis not present

## 2023-05-09 MED ORDER — OXYCODONE-ACETAMINOPHEN 5-325 MG PO TABS: 1.0000 | ORAL_TABLET | Freq: Two times a day (BID) | ORAL | 0 refills | Status: AC | PRN

## 2023-05-09 NOTE — Progress Notes (Signed)
PROVIDER NOTE: Information contained herein reflects review and annotations entered in association with encounter. Interpretation of such information and data should be left to medically-trained personnel. Information provided to patient can be located elsewhere in the medical record under "Patient Instructions". Document created using STT-dictation technology, any transcriptional errors that may result from process are unintentional.    Patient: Danny Marquez  Service Category: E/M  Provider: Edward Jolly, MD  DOB: 12/20/1945  DOS: 05/09/2023  Referring Provider: [facility]  MRN: [id number]  Specialty: Interventional Pain Management  [facility]  Type: Established Patient  Setting: Ambulatory outpatient    Location: Office  Delivery: Face-to-face     HPI  [name], a 77 y.o. year old male, is here today because of his History of lumbar fusion [Z98.1]. [name] primary complain today is Hip Pain (right)  Pertinent problems: [name] has History of myocardial infarction; Spinal stenosis, lumbar region, with neurogenic claudication; History of lumbar fusion; Chronic radicular lumbar pain; S/P fusion of sacroiliac joint (right); Chronic pain syndrome; Chronic right SI joint pain; and Lumbar spondylosis on their pertinent problem list. Pain Assessment: Severity of Chronic pain is reported as a 7 /10. Location: Hip Right/denies. Onset: More than a month ago. Quality: Constant, Aching, Dull. Timing: Constant. Modifying factor(s): pain meds. Vitals:  height is 5\' 9"  (1.753 m) and weight is 182 lb (82.6 kg). His temperature is 97.9 F (36.6 C). His blood pressure is 124/70 and his pulse is 71. His respiration is 16 and oxygen saturation is 99%.  BMI: Estimated body mass index is 26.88 kg/m as calculated from the following:   Height as of this encounter: 5\' 9"  (1.753 m).   Weight as of this encounter: 182 lb (82.6 kg). Last encounter: 03/13/2023. Last procedure:  04/17/2023.  Reason for encounter: both, medication management and post-procedure evaluation and assessment.    Post-procedure evaluation   Procedure: Lumbar Facet, Medial Branch Radiofrequency Ablation (RFA) #1  Laterality: Right (-RT)  Level: L1, L2, and L3 Medial Branch Level(s). These levels will denervate the L1-2 and L2-3 lumbar facet joints.  Imaging: Fluoroscopy-guided         Anesthesia: Local anesthesia (1-2% Lidocaine) Anxiolysis: 2 mg IV Versed DOS: 04/17/2023  Performed by: Edward Jolly, MD  Purpose: Therapeutic/Palliative Indications: Low back pain severe enough to impact quality of life or function. Indications: 1. Lumbar facet arthropathy   2. Lumbar spondylosis   3. Chronic pain syndrome    [name] has been dealing with the above chronic pain for longer than three months and has either failed to respond, was unable to tolerate, or simply did not get enough benefit from other more conservative therapies including, but not limited to: 1. Over-the-counter medications 2. Anti-inflammatory medications 3. Muscle relaxants 4. Membrane stabilizers 5. Opioids 6. Physical therapy and/or chiropractic manipulation 7. Modalities (Heat, ice, etc.) 8. Invasive techniques such as nerve blocks. [name] has attained more than 50% relief of the pain from a series of diagnostic injections conducted in separate occasions.  Pain Score: Pre-procedure: 6 /10 Post-procedure: 0-No pain/10     Effectiveness:  Initial hour after procedure: 100 %  Subsequent 4-6 hours post-procedure: 100 %  Analgesia past initial 6 hours: 25 % (lasting a week)  Ongoing improvement:  Analgesic:  20%    Pharmacotherapy Assessment  Analgesic: Percocet 5 mg BID prn  Monitoring: [place] PMP: PDMP reviewed during this encounter.       Pharmacotherapy: No side-effects or adverse  reactions reported. Compliance: No problems identified. Effectiveness: Clinically acceptable.  Valerie Salts, RN   05/09/2023  2:42 PM  Sign when Signing Visit Nursing Pain Medication Assessment:  Safety precautions to be maintained throughout the outpatient stay will include: orient to surroundings, keep bed in low position, maintain call bell within reach at all times, provide assistance with transfer out of bed and ambulation.  Medication Inspection Compliance: Pill count conducted under aseptic conditions, in front of the patient. Neither the pills nor the bottle was removed from the patient's sight at any time. Once count was completed pills were immediately returned to the patient in their original bottle.  Medication: Oxycodone/APAP Pill/Patch Count:  0 of 60 pills remain Pill/Patch Appearance: Markings consistent with prescribed medication Bottle Appearance: Standard pharmacy container. Clearly labeled. Filled Date: 07 / 03 / 2024 Last Medication intake:  Today    No results found for: "CBDTHCR" No results found for: "D8THCCBX" No results found for: "D9THCCBX"  UDS:  Summary  Date Value Ref Range Status  01/22/2023 Note  Final    Comment:    ==================================================================== Compliance Drug Analysis, Ur ==================================================================== Test                             Result       Flag       Units  Drug Present and Declared for Prescription Verification   Pregabalin                     PRESENT      EXPECTED   Sertraline                     PRESENT      EXPECTED   Desmethylsertraline            PRESENT      EXPECTED    Desmethylsertraline is an expected metabolite of sertraline.    Trazodone                      PRESENT      EXPECTED   1,3 chlorophenyl piperazine    PRESENT      EXPECTED    1,3-chlorophenyl piperazine is an expected metabolite of trazodone.    Acetaminophen                  PRESENT      EXPECTED   Metoprolol                     PRESENT      EXPECTED  Drug Present not Declared for Prescription  Verification   Guaifenesin                    PRESENT      UNEXPECTED    Guaifenesin may be administered as an over-the-counter or    prescription drug; it may also be present as a breakdown product of    methocarbamol.  Drug Absent but Declared for Prescription Verification   Oxycodone                      Not Detected UNEXPECTED ng/mg creat   Salicylate                     Not Detected UNEXPECTED    Aspirin, as indicated in the declared medication list, is not always  detected even when used as directed.  ==================================================================== Test                      Result    Flag   Units      Ref Range   Creatinine              58               mg/dL      >=09 ==================================================================== Declared Medications:  The flagging and interpretation on this report are based on the  following declared medications.  Unexpected results may arise from  inaccuracies in the declared medications.   **Note: The testing scope of this panel includes these medications:   Metoprolol (Lopressor)  Oxycodone (Oxy-IR)  Oxycodone (Percocet)  Pregabalin (Lyrica)  Sertraline (Zoloft)  Trazodone   **Note: The testing scope of this panel does not include small to  moderate amounts of these reported medications:   Acetaminophen (Percocet)  Aspirin   **Note: The testing scope of this panel does not include the  following reported medications:   Allopurinol (Zyloprim)  Amiodarone (Pacerone)  Donepezil  Empagliflozin (Jardiance)  Losartan (Cozaar)  Ranolazine (Ranexa)  Rosuvastatin (Crestor)  Torsemide (Demadex) ==================================================================== For clinical consultation, please call [phone]. ====================================================================       ROS  Constitutional: Denies any fever or chills Gastrointestinal: No reported hemesis, hematochezia, vomiting,  or acute GI distress Musculoskeletal:  low back and radiating right hip pain Neurological: No reported episodes of acute onset apraxia, aphasia, dysarthria, agnosia, amnesia, paralysis, loss of coordination, or loss of consciousness  Medication Review  Donepezil HCl, allopurinol, amiodarone, aspirin EC, empagliflozin, ferrous gluconate, losartan, metoprolol tartrate, oxyCODONE-acetaminophen, pantoprazole, pregabalin, ranolazine, rosuvastatin, sertraline, torsemide, and traZODone HCl  History Review  Allergy: [name] is allergic to codeine. Drug: [name]  reports that he does not currently use drugs. Alcohol:  reports that he does not currently use alcohol after a past usage of about 24.0 standard drinks of alcohol per week. Tobacco:  reports that he has quit smoking. His smoking use included cigarettes. He has never used smokeless tobacco. Social: [name]  reports that he has quit smoking. His smoking use included cigarettes. He has never used smokeless tobacco. He reports that he does not currently use alcohol after a past usage of about 24.0 standard drinks of alcohol per week. He reports that he does not currently use drugs. Medical:  has a past medical history of A-fib (HCC), CHF (congestive heart failure) (HCC), Chronic kidney disease, Hypertension, Myocardial infarction (HCC), and Solitary kidney. Surgical: [name]  has a past surgical history that includes Coronary artery bypass graft; Back surgery; CARDIOVERSION (N/A, 01/23/2017); CARDIOVERSION (N/A, 02/01/2017); Joint replacement; Colonoscopy with propofol (N/A, 03/05/2019); and Esophagogastroduodenoscopy (egd) with propofol (N/A, 03/05/2019). Family: family history includes Heart attack in his father.  Laboratory Chemistry Profile   Renal Lab Results  Component Value Date   BUN 24 (H) 03/03/2022   CREATININE 1.45 (H) 03/03/2022   GFRAA >60 08/06/2019   GFRNONAA 50 (L) 03/03/2022    Hepatic Lab Results  Component Value  Date   AST 45 (H) 03/03/2022   ALT 17 03/03/2022   ALBUMIN 3.1 (L) 03/03/2022   ALKPHOS 85 03/03/2022   LIPASE 356 (H) 08/06/2019   AMMONIA 29 08/06/2019    Electrolytes Lab Results  Component Value Date   NA 139 03/03/2022   K 4.3 03/03/2022   CL 110 03/03/2022   CALCIUM 9.8 03/03/2022  MG 2.1 05/20/2021    Bone No results found for: "VD25OH", "VD125OH2TOT", "QQ5956LO7", "FI4332RJ1", "25OHVITD1", "25OHVITD2", "25OHVITD3", "TESTOFREE", "TESTOSTERONE"  Inflammation (CRP: Acute Phase) (ESR: Chronic Phase) Lab Results  Component Value Date   LATICACIDVEN 1.1 08/06/2019         Note: Above Lab results reviewed.   Physical Exam  General appearance: Well nourished, well developed, and well hydrated. In no apparent acute distress Mental status: Alert, oriented x 3 (person, place, & time)       Respiratory: No evidence of acute respiratory distress Eyes: PERLA Vitals: BP 124/70   Pulse 71   Temp 97.9 F (36.6 C)   Resp 16   Ht 5\' 9"  (1.753 m)   Wt 182 lb (82.6 kg)   SpO2 99%   BMI 26.88 kg/m  BMI: Estimated body mass index is 26.88 kg/m as calculated from the following:   Height as of this encounter: 5\' 9"  (1.753 m).   Weight as of this encounter: 182 lb (82.6 kg). Ideal: Ideal body weight: 70.7 kg (155 lb 13.8 oz) Adjusted ideal body weight: 75.4 kg (166 lb 5.1 oz)   Assessment   Diagnosis Status  1. History of lumbar fusion (L4-S1)   2. Failed back surgical syndrome   3. Lumbar radiculopathy   4. Chronic radicular lumbar pain   5. Chronic pain syndrome   6. Other intervertebral disc degeneration, thoracic region    Persistent Persistent Persistent   Updated Problems: Problem  Chronic Radicular Lumbar Pain  Failed Back Surgical Syndrome    Plan of Care  I discussed  percutaneous spinal cord stimulator trial with the patient in detail. I explained to the patient that they will have an external power source and programmer which the patient will use for 7  days. There will be daily communication with the stimulator company and the patient. A possible need for a mid-trial clinic visit to give the patient the best chance of success.   Patient will need to have a thorough psychosocial behavioral evaluation. Our office will be happy to help facilitate this. Will place referral to [facility].  I will also order a thoracic MRI to rule out thoracic canal stenosis for spinal cord stim trial planning  Some of patient's pain does seem to be mechanical in nature, with some component of neurogenic pain as well. We discussed the indications for spinal cord stimulation, specifically stating that it is typically better for neuropathic and appendicular pain, but that we have had some success in the treatment of low back and hip pain.   Patient is interested in proceeding with spinal cord stimulation trial. He understands that this may not be successful, and that spinal cord stimulation in general is not a "magic bullet."   Of note patient does have a AICD in place.  It is Medtronic.  I encouraged him to reach out to his cardiologist to let that individual know that we are planning for a spinal cord stimulator trial and to see if he has any concerns about it given that the patient does have a pacemaker/defibrillator.  He will be nice to have the [employer] cardiac rep here in addition to the spinal cord stim rep if and when we do the spinal cord stim trial.  We had a lengthy and very detailed discussion of all the risks, benefits, alternatives, and rationale of surgery as well as the option of continuing nonsurgical therapies. We specifically discussed the risks of temporary or permanent worsened neurologic injury, no symptomatic relief  or pain made worse after procedure, and also the need for future surgery (due to infection, CSF leak, bleeding, adjacent segment issues, bone-healing difficulties, and other related issues). No guarantees of outcome were made or implied and  he is eager to proceed and presents for definitive treatment  He  told me that all of his questions were answered thoroughly and to his satisfaction. Confidence and understanding of the discussed risks and consequences of  treatment was expressed and he accepted these risks and was eager to proceed with procedure.   Issues concerning treatment and diagnosis were discussed with the patient. There are no barriers to understanding the plan of treatment. Explanation was well received by patient and/or family who then verbalized understanding.    Pharmacotherapy (Medications Ordered): Meds ordered this encounter  Medications   oxyCODONE-acetaminophen (PERCOCET) 5-325 MG tablet    Sig: Take 1 tablet by mouth every 12 (twelve) hours as needed for severe pain. Must last 30 days.    Dispense:  60 tablet    Refill:  0    Chronic Pain: STOP Act (Not applicable) Fill 1 day early if closed on refill date. Avoid benzodiazepines within 8 hours of opioids   Orders:  Orders Placed This Encounter  Procedures   [place] TRIAL    Contact medical implant company representative to notify them of the scheduled case and to make sure they will be available to provide required equipment.    Standing Status:   Future    Standing Expiration Date:   08/09/2023    Scheduling Instructions:     Side: Bilateral     Level: Lumbar     Device: Medtronic     Sedation: With sedation     Timeframe: As soon as pre-approved    Order Specific Question:   Where will this procedure be performed?    Answer:   [facility]   MR THORACIC SPINE WO CONTRAST    Patient presents with axial pain with possible radicular component. Please assist [place] in identifying specific level(s) and laterality of any additional findings such as: 1. Facet (Zygapophyseal) joint DJD (Hypertrophy, space narrowing, subchondral sclerosis, and/or osteophyte formation) 2. DDD and/or IVDD (Loss of disc height, desiccation, gas patterns, osteophytes,  endplate sclerosis, or "Black disc disease") 3. Pars defects 4. Spondylolisthesis, spondylosis, and/or spondyloarthropathies (include Degree/Grade of displacement in mm) (stability) 5. Vertebral body Fractures (acute/chronic) (state percentage of collapse) 6. Demineralization (osteopenia/osteoporotic) 7. Bone pathology 8. Foraminal narrowing  9. Surgical changes 10. Central, Lateral Recess, and/or Foraminal Stenosis (include AP diameter of stenosis in mm) 11. Surgical changes (hardware type, status, and presence of fibrosis) 12. Modic Type Changes (MRI only) 13. IVDD (Disc bulge, protrusion, herniation, extrusion) (Level, laterality, extent)    Standing Status:   Future    Standing Expiration Date:   06/09/2023    Scheduling Instructions:     Please make sure that the patient understands that this needs to be done as soon as possible. Never have the patient do the imaging "just before the next appointment". Inform patient that having the imaging done within the [facility] will expedite the availability of the results and will provide      imaging availability to the requesting physician. In addition inform the patient that the imaging order has an expiration date and will not be renewed if not done within the active period.    Order Specific Question:   What is the patient's sedation requirement?    Answer:   No  Sedation    Order Specific Question:   Does the patient have a pacemaker or implanted devices?    Answer:   No    Order Specific Question:   Preferred imaging location?    Answer:   [facility] (table limit-350lbs)    Order Specific Question:   Call Results- Best Contact Number?    Answer:   [phone] [facility])    Order Specific Question:   Radiology Contrast Protocol - do NOT remove file path    Answer:   [email or web address]   Ambulatory referral to Psychology    Referral Priority:   Routine    Referral Type:   Psychiatric     Referral Reason:   Specialty Services Required    Requested Specialty:   Psychology    Number of Visits Requested:   1   Follow-up plan:   Return in about 4 weeks (around 06/06/2023) for Medtronic SCS trial, ECT.      Right L1,2,3 RFA 04/17/23     Recent Visits Date Type Provider Dept  04/17/23 Procedure visit Edward Jolly, MD [facility]  03/13/23 Office Visit Edward Jolly, MD [facility]  02/13/23 Procedure visit Edward Jolly, MD [facility]  Showing recent visits within past 90 days and meeting all other requirements Today's Visits Date Type Provider Dept  05/09/23 Office Visit Edward Jolly, MD [facility]  Showing today's visits and meeting all other requirements Future Appointments No visits were found meeting these conditions. Showing future appointments within next 90 days and meeting all other requirements  I discussed the assessment and treatment plan with the patient. The patient was provided an opportunity to ask questions and all were answered. The patient agreed with the plan and demonstrated an understanding of the instructions.  Patient advised to call back or seek an in-person evaluation if the symptoms or condition worsens.  Duration of encounter: .  Total time on encounter, as per AMA guidelines included both the face-to-face and non-face-to-face time personally spent by the physician and/or other qualified health care professional(s) on the day of the encounter (includes time in activities that require the physician or other qualified health care professional and does not include time in activities normally performed by clinical staff). Physician's time may include the following activities when performed: Preparing to see the patient (e.g., pre-charting review of records, searching for previously ordered imaging, lab work, and nerve conduction tests) Review of prior analgesic pharmacotherapies. Reviewing  PMP Interpreting ordered tests (e.g., lab work, imaging, nerve conduction tests) Performing post-procedure evaluations, including interpretation of diagnostic procedures Obtaining and/or reviewing separately obtained history Performing a medically appropriate examination and/or evaluation Counseling and educating the patient/family/caregiver Ordering medications, tests, or procedures Referring and communicating with other health care professionals (when not separately reported) Documenting clinical information in the electronic or other health record Independently interpreting results (not separately reported) and communicating results to the patient/ family/caregiver Care coordination (not separately reported)  Note by: Edward Jolly, MD Date: 05/09/2023; Time: 4:38 PM

## 2023-05-09 NOTE — Progress Notes (Signed)
Nursing Pain Medication Assessment:  Safety precautions to be maintained throughout the outpatient stay will include: orient to surroundings, keep bed in low position, maintain call bell within reach at all times, provide assistance with transfer out of bed and ambulation.  Medication Inspection Compliance: Pill count conducted under aseptic conditions, in front of the patient. Neither the pills nor the bottle was removed from the patient's sight at any time. Once count was completed pills were immediately returned to the patient in their original bottle.  Medication: Oxycodone/APAP Pill/Patch Count:  0 of 60 pills remain Pill/Patch Appearance: Markings consistent with prescribed medication Bottle Appearance: Standard pharmacy container. Clearly labeled. Filled Date: 07 / 03 / 2024 Last Medication intake:  Today

## 2023-05-09 NOTE — Patient Instructions (Addendum)
Refill medication I will look into spinal cord stimulation in patients who  have pacemakers and defibrillators. If it is safe, I will order Thoracic MRI, psych eval, and spinal cord stimulator trial  Make an appointment with Dr Shon Hale cardiologist to get clearance to have a spinal cord stimulator in regards to the internal defibrillator that is already in place. Please make a documented note in Epic.

## 2023-05-29 ENCOUNTER — Ambulatory Visit: Payer: Non-veteran care | Admitting: Student in an Organized Health Care Education/Training Program

## 2023-06-10 ENCOUNTER — Telehealth: Payer: Self-pay | Admitting: Student in an Organized Health Care Education/Training Program

## 2023-06-10 NOTE — Telephone Encounter (Signed)
Per policy, an appt is required in order to prescribe opioids.

## 2023-06-10 NOTE — Telephone Encounter (Signed)
PT wife called states that patient needed a refill to be called in for oxycodone prescription. Please give patient and pharmacy a call. TY

## 2023-06-20 ENCOUNTER — Ambulatory Visit
Payer: No Typology Code available for payment source | Attending: Student in an Organized Health Care Education/Training Program | Admitting: Student in an Organized Health Care Education/Training Program

## 2023-06-20 ENCOUNTER — Encounter: Payer: Self-pay | Admitting: Student in an Organized Health Care Education/Training Program

## 2023-06-20 VITALS — BP 130/47 | HR 66 | Temp 97.3°F | Resp 16 | Ht 69.0 in | Wt 191.0 lb

## 2023-06-20 DIAGNOSIS — I130 Hypertensive heart and chronic kidney disease with heart failure and stage 1 through stage 4 chronic kidney disease, or unspecified chronic kidney disease: Secondary | ICD-10-CM | POA: Diagnosis not present

## 2023-06-20 DIAGNOSIS — G894 Chronic pain syndrome: Secondary | ICD-10-CM | POA: Diagnosis not present

## 2023-06-20 DIAGNOSIS — G8929 Other chronic pain: Secondary | ICD-10-CM

## 2023-06-20 DIAGNOSIS — M5416 Radiculopathy, lumbar region: Secondary | ICD-10-CM | POA: Diagnosis present

## 2023-06-20 DIAGNOSIS — Z981 Arthrodesis status: Secondary | ICD-10-CM | POA: Diagnosis present

## 2023-06-20 DIAGNOSIS — M961 Postlaminectomy syndrome, not elsewhere classified: Secondary | ICD-10-CM

## 2023-06-20 DIAGNOSIS — M5134 Other intervertebral disc degeneration, thoracic region: Secondary | ICD-10-CM | POA: Diagnosis not present

## 2023-06-20 MED ORDER — OXYCODONE-ACETAMINOPHEN 10-325 MG PO TABS: 1.0000 | ORAL_TABLET | Freq: Two times a day (BID) | ORAL | 0 refills | Status: AC | PRN

## 2023-06-20 NOTE — Progress Notes (Signed)
PROVIDER NOTE: Information contained herein reflects review and annotations entered in association with encounter. Interpretation of such information and data should be left to medically-trained personnel. Information provided to patient can be located elsewhere in the medical record under "Patient Instructions". Document created using STT-dictation technology, any transcriptional errors that may result from process are unintentional.    Patient: Danny Marquez  Service Category: E/M  Provider: Edward Jolly, MD  DOB: 19-Oct-1945  DOS: 06/20/2023  Referring Provider: [facility]  MRN: [id number]  Specialty: Interventional Pain Management  [facility]  Type: Established Patient  Setting: Ambulatory outpatient    Location: Office  Delivery: Face-to-face     HPI  [name], a 77 y.o. year old male, is here today because of his History of lumbar fusion [Z98.1]. Mr. Para primary complain today is Back Pain  Pertinent problems: Mr. Chaussee has History of myocardial infarction; Spinal stenosis, lumbar region, with neurogenic claudication; History of lumbar fusion; Chronic radicular lumbar pain; S/P fusion of sacroiliac joint (right); Chronic pain syndrome; Chronic right SI joint pain; and Lumbar spondylosis on their pertinent problem list. Pain Assessment: Severity of Chronic pain is reported as a 8 /10. Location: Back Lower, Right/right buttock. Onset: More than a month ago. Quality: Sharp. Timing: Constant. Modifying factor(s): meds. Vitals:  height is 5\' 9"  (1.753 m) and weight is 191 lb (86.6 kg). His temperature is 97.3 F (36.3 C) (abnormal). His blood pressure is 130/47 (abnormal) and his pulse is 66. His respiration is 16 and oxygen saturation is 99%.  BMI: Estimated body mass index is 28.21 kg/m as calculated from the following:   Height as of this encounter: 5\' 9"  (1.753 m).   Weight as of this encounter: 191 lb (86.6 kg). Last encounter: 03/13/2023. Last  procedure: 04/17/2023.  Reason for encounter: medication management.   Patient states that they have not heard back from the [place] regarding psych eval for approval for spinal cord stimulator.  We have not heard back regarding thoracic MRI either.  This was all discussed and orders were placed at the last visit. I will be placed order for thoracic MRI, psych eval and spinal cord stimulator trial. Patient states that he is also not obtaining satisfactory pain relief with this.  Dose of Percocet.  He states that the 5 mg is not very effective.  He states that when he does take 2 tablets, he notices a better response.  Pharmacotherapy Assessment  Analgesic: Percocet 5 mg BID prn  Monitoring: [place] PMP: PDMP reviewed during this encounter.       Pharmacotherapy: No side-effects or adverse reactions reported. Compliance: No problems identified. Effectiveness: Clinically acceptable.  Nonah Mattes, RN  06/20/2023  1:34 PM  Sign when Signing Visit Nursing Pain Medication Assessment:  Safety precautions to be maintained throughout the outpatient stay will include: orient to surroundings, keep bed in low position, maintain call bell within reach at all times, provide assistance with transfer out of bed and ambulation.  Medication Inspection Compliance: Mr. Urbanek did not comply with our request to bring his pills to be counted. He was reminded that bringing the medication bottles, even when empty, is a requirement.  Medication: None brought in. Pill/Patch Count: None available to be counted. Bottle Appearance: No container available. Did not bring bottle(s) to appointment. Filled Date: N/A Last Medication intake:  Today    No results found for: "CBDTHCR" No results found for: "D8THCCBX" No results found for: "D9THCCBX"  UDS:  Summary  Date Value Ref Range Status  01/22/2023 Note  Final    Comment:    ==================================================================== Compliance Drug Analysis,  [place] ==================================================================== Test                             Result       Flag       Units  Drug Present and Declared for Prescription Verification   Pregabalin                     PRESENT      EXPECTED   Sertraline                     PRESENT      EXPECTED   Desmethylsertraline            PRESENT      EXPECTED    Desmethylsertraline is an expected metabolite of sertraline.    Trazodone                      PRESENT      EXPECTED   1,3 chlorophenyl piperazine    PRESENT      EXPECTED    1,3-chlorophenyl piperazine is an expected metabolite of trazodone.    Acetaminophen                  PRESENT      EXPECTED   Metoprolol                     PRESENT      EXPECTED  Drug Present not Declared for Prescription Verification   Guaifenesin                    PRESENT      UNEXPECTED    Guaifenesin may be administered as an over-the-counter or    prescription drug; it may also be present as a breakdown product of    methocarbamol.  Drug Absent but Declared for Prescription Verification   Oxycodone                      Not Detected UNEXPECTED ng/mg creat   Salicylate                     Not Detected UNEXPECTED    Aspirin, as indicated in the declared medication list, is not always    detected even when used as directed.  ==================================================================== Test                      Result    Flag   Units      Ref Range   Creatinine              58               mg/dL      >=62 ==================================================================== Declared Medications:  The flagging and interpretation on this report are based on the  following declared medications.  Unexpected results may arise from  inaccuracies in the declared medications.   **Note: The testing scope of this panel includes these medications:   Metoprolol (Lopressor)  Oxycodone (Oxy-IR)  Oxycodone (Percocet)  Pregabalin (Lyrica)  Sertraline  (Zoloft)  Trazodone   **Note: The testing scope of this panel does not include small to  moderate amounts of these reported medications:   Acetaminophen (Percocet)  Aspirin   **Note: The testing scope of this panel does not include the  following reported medications:   Allopurinol (Zyloprim)  Amiodarone (Pacerone)  Donepezil  Empagliflozin (Jardiance)  Losartan (Cozaar)  Ranolazine (Ranexa)  Rosuvastatin (Crestor)  Torsemide (Demadex) ==================================================================== For clinical consultation, please call [phone]. ====================================================================       ROS  Constitutional: Denies any fever or chills Gastrointestinal: No reported hemesis, hematochezia, vomiting, or acute GI distress Musculoskeletal:  low back and radiating right hip pain Neurological: No reported episodes of acute onset apraxia, aphasia, dysarthria, agnosia, amnesia, paralysis, loss of coordination, or loss of consciousness  Medication Review  Donepezil HCl, allopurinol, aspirin EC, empagliflozin, ferrous gluconate, losartan, metoprolol tartrate, oxyCODONE-acetaminophen, pantoprazole, pregabalin, ranolazine, rosuvastatin, sertraline, torsemide, and traZODone HCl  History Review  Allergy: Mr. Ribeiro is allergic to codeine. Drug: Mr. Cepero  reports that he does not currently use drugs. Alcohol:  reports that he does not currently use alcohol after a past usage of about 24.0 standard drinks of alcohol per week. Tobacco:  reports that he has quit smoking. His smoking use included cigarettes. He has never used smokeless tobacco. Social: Mr. Cortes  reports that he has quit smoking. His smoking use included cigarettes. He has never used smokeless tobacco. He reports that he does not currently use alcohol after a past usage of about 24.0 standard drinks of alcohol per week. He reports that he does not currently use drugs. Medical:  has  a past medical history of A-fib (HCC), CHF (congestive heart failure) (HCC), Chronic kidney disease, Hypertension, Myocardial infarction (HCC), and Solitary kidney. Surgical: Mr. Dettman  has a past surgical history that includes Coronary artery bypass graft; Back surgery; CARDIOVERSION (N/A, 01/23/2017); CARDIOVERSION (N/A, 02/01/2017); Joint replacement; Colonoscopy with propofol (N/A, 03/05/2019); and Esophagogastroduodenoscopy (egd) with propofol (N/A, 03/05/2019). Family: family history includes Heart attack in his father.  Laboratory Chemistry Profile   Renal Lab Results  Component Value Date   BUN 24 (H) 03/03/2022   CREATININE 1.45 (H) 03/03/2022   GFRAA >60 08/06/2019   GFRNONAA 50 (L) 03/03/2022    Hepatic Lab Results  Component Value Date   AST 45 (H) 03/03/2022   ALT 17 03/03/2022   ALBUMIN 3.1 (L) 03/03/2022   ALKPHOS 85 03/03/2022   LIPASE 356 (H) 08/06/2019   AMMONIA 29 08/06/2019    Electrolytes Lab Results  Component Value Date   NA 139 03/03/2022   K 4.3 03/03/2022   CL 110 03/03/2022   CALCIUM 9.8 03/03/2022   MG 2.1 05/20/2021    Bone No results found for: "VD25OH", "VD125OH2TOT", "UJ8119JY7", "WG9562ZH0", "25OHVITD1", "25OHVITD2", "25OHVITD3", "TESTOFREE", "TESTOSTERONE"  Inflammation (CRP: Acute Phase) (ESR: Chronic Phase) Lab Results  Component Value Date   LATICACIDVEN 1.1 08/06/2019         Note: Above Lab results reviewed.   Physical Exam  General appearance: Well nourished, well developed, and well hydrated. In no apparent acute distress Mental status: Alert, oriented x 3 (person, place, & time)       Respiratory: No evidence of acute respiratory distress Eyes: PERLA Vitals: BP (!) 130/47   Pulse 66   Temp (!) 97.3 F (36.3 C)   Resp 16   Ht 5\' 9"  (1.753 m)   Wt 191 lb (86.6 kg)   SpO2 99%   BMI 28.21 kg/m  BMI: Estimated body mass index is 28.21 kg/m as calculated from the following:   Height as of this encounter: 5\' 9"  (1.753 m).    Weight as of this  encounter: 191 lb (86.6 kg). Ideal: Ideal body weight: 70.7 kg (155 lb 13.8 oz) Adjusted ideal body weight: 77.1 kg (169 lb 14.7 oz)   Assessment   Diagnosis Status  1. History of lumbar fusion (L4-S1)   2. Failed back surgical syndrome   3. Lumbar radiculopathy   4. Chronic radicular lumbar pain   5. Chronic pain syndrome   6. Other intervertebral disc degeneration, thoracic region    Persistent Persistent Persistent   Updated Problems: No problems updated.   Plan of Care  I discussed  percutaneous spinal cord stimulator trial with the patient in detail. I explained to the patient that they will have an external power source and programmer which the patient will use for 7 days. There will be daily communication with the stimulator company and the patient. A possible need for a mid-trial clinic visit to give the patient the best chance of success.   Patient will need to have a thorough psychosocial behavioral evaluation. Our office will be happy to help facilitate this. Will place referral to [facility].  I will also order a thoracic MRI to rule out thoracic canal stenosis for spinal cord stim trial planning  Some of patient's pain does seem to be mechanical in nature, with some component of neurogenic pain as well. We discussed the indications for spinal cord stimulation, specifically stating that it is typically better for neuropathic and appendicular pain, but that we have had some success in the treatment of low back and hip pain.   Patient is interested in proceeding with spinal cord stimulation trial. He understands that this may not be successful, and that spinal cord stimulation in general is not a "magic bullet."   We had a lengthy and very detailed discussion of all the risks, benefits, alternatives, and rationale of surgery as well as the option of continuing nonsurgical therapies. We specifically discussed the risks of temporary or permanent worsened  neurologic injury, no symptomatic relief or pain made worse after procedure, and also the need for future surgery (due to infection, CSF leak, bleeding, adjacent segment issues, bone-healing difficulties, and other related issues). No guarantees of outcome were made or implied and he is eager to proceed and presents for definitive treatment  He  told me that all of his questions were answered thoroughly and to his satisfaction. Confidence and understanding of the discussed risks and consequences of  treatment was expressed and he accepted these risks and was eager to proceed with procedure.   Issues concerning treatment and diagnosis were discussed with the patient. There are no barriers to understanding the plan of treatment. Explanation was well received by patient and/or family who then verbalized understanding.    Pharmacotherapy (Medications Ordered): Meds ordered this encounter  Medications   oxyCODONE-acetaminophen (PERCOCET) 10-325 MG tablet    Sig: Take 1 tablet by mouth every 12 (twelve) hours as needed for pain. Must last 30 days.    Dispense:  60 tablet    Refill:  0    Chronic Pain: STOP Act (Not applicable) Fill 1 day early if closed on refill date. Avoid benzodiazepines within 8 hours of opioids   oxyCODONE-acetaminophen (PERCOCET) 10-325 MG tablet    Sig: Take 1 tablet by mouth every 12 (twelve) hours as needed for pain. Must last 30 days.    Dispense:  60 tablet    Refill:  0    Chronic Pain: STOP Act (Not applicable) Fill 1 day early if closed on refill date. Avoid benzodiazepines within  8 hours of opioids   oxyCODONE-acetaminophen (PERCOCET) 10-325 MG tablet    Sig: Take 1 tablet by mouth every 12 (twelve) hours as needed for pain. Must last 30 days.    Dispense:  60 tablet    Refill:  0    Chronic Pain: STOP Act (Not applicable) Fill 1 day early if closed on refill date. Avoid benzodiazepines within 8 hours of opioids   Orders:  Orders Placed This Encounter  Procedures    [place] TRIAL    Contact medical implant company representative to notify them of the scheduled case and to make sure they will be available to provide required equipment.    Standing Status:   Future    Standing Expiration Date:   09/20/2023    Scheduling Instructions:     Side: Bilateral     Level: Lumbar     Device: Medtronic     Sedation: With sedation     Timeframe: As soon as pre-approved    Order Specific Question:   Where will this procedure be performed?    Answer:   [facility]   MR THORACIC SPINE WO CONTRAST    Patient presents with axial pain with possible radicular component. Please assist [place] in identifying specific level(s) and laterality of any additional findings such as: 1. Facet (Zygapophyseal) joint DJD (Hypertrophy, space narrowing, subchondral sclerosis, and/or osteophyte formation) 2. DDD and/or IVDD (Loss of disc height, desiccation, gas patterns, osteophytes, endplate sclerosis, or "Black disc disease") 3. Pars defects 4. Spondylolisthesis, spondylosis, and/or spondyloarthropathies (include Degree/Grade of displacement in mm) (stability) 5. Vertebral body Fractures (acute/chronic) (state percentage of collapse) 6. Demineralization (osteopenia/osteoporotic) 7. Bone pathology 8. Foraminal narrowing  9. Surgical changes 10. Central, Lateral Recess, and/or Foraminal Stenosis (include AP diameter of stenosis in mm) 11. Surgical changes (hardware type, status, and presence of fibrosis) 12. Modic Type Changes (MRI only) 13. IVDD (Disc bulge, protrusion, herniation, extrusion) (Level, laterality, extent)    Standing Status:   Future    Standing Expiration Date:   07/21/2023    Scheduling Instructions:     Please make sure that the patient understands that this needs to be done as soon as possible. Never have the patient do the imaging "just before the next appointment". Inform patient that having the imaging done within the [facility] will expedite the  availability of the results and will provide      imaging availability to the requesting physician. In addition inform the patient that the imaging order has an expiration date and will not be renewed if not done within the active period.    Order Specific Question:   What is the patient's sedation requirement?    Answer:   No Sedation    Order Specific Question:   Does the patient have a pacemaker or implanted devices?    Answer:   No    Order Specific Question:   Preferred imaging location?    Answer:   [facility] (table limit-350lbs)    Order Specific Question:   Call Results- Best Contact Number?    Answer:   [phone] [facility])    Order Specific Question:   Radiology Contrast Protocol - do NOT remove file path    Answer:   [email or web address]   Ambulatory referral to Psychology    Referral Priority:   Routine    Referral Type:   Psychiatric    Referral Reason:   Specialty Services Required    Requested Specialty:   Psychology  Number of Visits Requested:   1   Follow-up plan:   Return in about 6 weeks (around 07/29/2023) for Medtronic SCS trial, ECT.      Right L1,2,3 RFA 04/17/23     Recent Visits Date Type Provider Dept  05/09/23 Office Visit Edward Jolly, MD [facility]  04/17/23 Procedure visit Edward Jolly, MD [facility]  Showing recent visits within past 90 days and meeting all other requirements Today's Visits Date Type Provider Dept  06/20/23 Office Visit Edward Jolly, MD [facility]  Showing today's visits and meeting all other requirements Future Appointments No visits were found meeting these conditions. Showing future appointments within next 90 days and meeting all other requirements  I discussed the assessment and treatment plan with the patient. The patient was provided an opportunity to ask questions and all were answered. The patient agreed with the plan and demonstrated  an understanding of the instructions.  Patient advised to call back or seek an in-person evaluation if the symptoms or condition worsens.  Duration of encounter: .  Total time on encounter, as per AMA guidelines included both the face-to-face and non-face-to-face time personally spent by the physician and/or other qualified health care professional(s) on the day of the encounter (includes time in activities that require the physician or other qualified health care professional and does not include time in activities normally performed by clinical staff). Physician's time may include the following activities when performed: Preparing to see the patient (e.g., pre-charting review of records, searching for previously ordered imaging, lab work, and nerve conduction tests) Review of prior analgesic pharmacotherapies. Reviewing PMP Interpreting ordered tests (e.g., lab work, imaging, nerve conduction tests) Performing post-procedure evaluations, including interpretation of diagnostic procedures Obtaining and/or reviewing separately obtained history Performing a medically appropriate examination and/or evaluation Counseling and educating the patient/family/caregiver Ordering medications, tests, or procedures Referring and communicating with other health care professionals (when not separately reported) Documenting clinical information in the electronic or other health record Independently interpreting results (not separately reported) and communicating results to the patient/ family/caregiver Care coordination (not separately reported)  Note by: Edward Jolly, MD Date: 06/20/2023; Time: 3:20 PM

## 2023-06-20 NOTE — Progress Notes (Signed)
Nursing Pain Medication Assessment:  Safety precautions to be maintained throughout the outpatient stay will include: orient to surroundings, keep bed in low position, maintain call bell within reach at all times, provide assistance with transfer out of bed and ambulation.  Medication Inspection Compliance: Danny Marquez did not comply with our request to bring his pills to be counted. He was reminded that bringing the medication bottles, even when empty, is a requirement.  Medication: None brought in. Pill/Patch Count: None available to be counted. Bottle Appearance: No container available. Did not bring bottle(s) to appointment. Filled Date: N/A Last Medication intake:  Today

## 2023-06-24 ENCOUNTER — Telehealth: Payer: Self-pay | Admitting: Student in an Organized Health Care Education/Training Program

## 2023-06-24 NOTE — Telephone Encounter (Signed)
Called and confirmed with pharmacy. Script is there. Patient wife called and I left a voicemail.

## 2023-06-24 NOTE — Telephone Encounter (Signed)
Calling pharmacy to confirm

## 2023-06-24 NOTE — Telephone Encounter (Signed)
Called to patient, wife told me that she had just spoken with Victorino Dike and she was going to call [facility] and check on Rxs

## 2023-06-24 NOTE — Telephone Encounter (Signed)
PT wife called states that when she went to pick up patient oxycodone it was date for wrong pick up date. PT is out of medications and wife asked if medication can be send in today please.

## 2023-06-27 ENCOUNTER — Telehealth: Payer: Self-pay

## 2023-06-27 NOTE — Telephone Encounter (Signed)
The patients wife called and said that Mr. Tiptons cardiologist gave the ok for the scs trial. She is still working on getting them to refer for the pysch eval. His MRI has been scheduled.

## 2023-07-08 ENCOUNTER — Encounter: Payer: Self-pay | Admitting: Student in an Organized Health Care Education/Training Program

## 2023-07-09 ENCOUNTER — Ambulatory Visit (HOSPITAL_COMMUNITY): Payer: No Typology Code available for payment source

## 2023-07-09 ENCOUNTER — Ambulatory Visit (HOSPITAL_COMMUNITY)
Admission: RE | Admit: 2023-07-09 | Discharge: 2023-07-09 | Disposition: A | Discharge status: Home / Self Care | Payer: No Typology Code available for payment source | Source: Ambulatory Visit | Attending: Student in an Organized Health Care Education/Training Program | Admitting: Student in an Organized Health Care Education/Training Program

## 2023-07-09 DIAGNOSIS — M5134 Other intervertebral disc degeneration, thoracic region: Secondary | ICD-10-CM | POA: Diagnosis present

## 2023-08-24 IMAGING — CR DG CHEST 2V
3 series · 3 of 3 positions shown · non-contrast
Comparison: Chest radiograph dated August 06, 2019

CLINICAL DATA: Cough, congestion, 1373%

EXAM:
CHEST - 2 VIEW

[chest pa (1 of 2)]
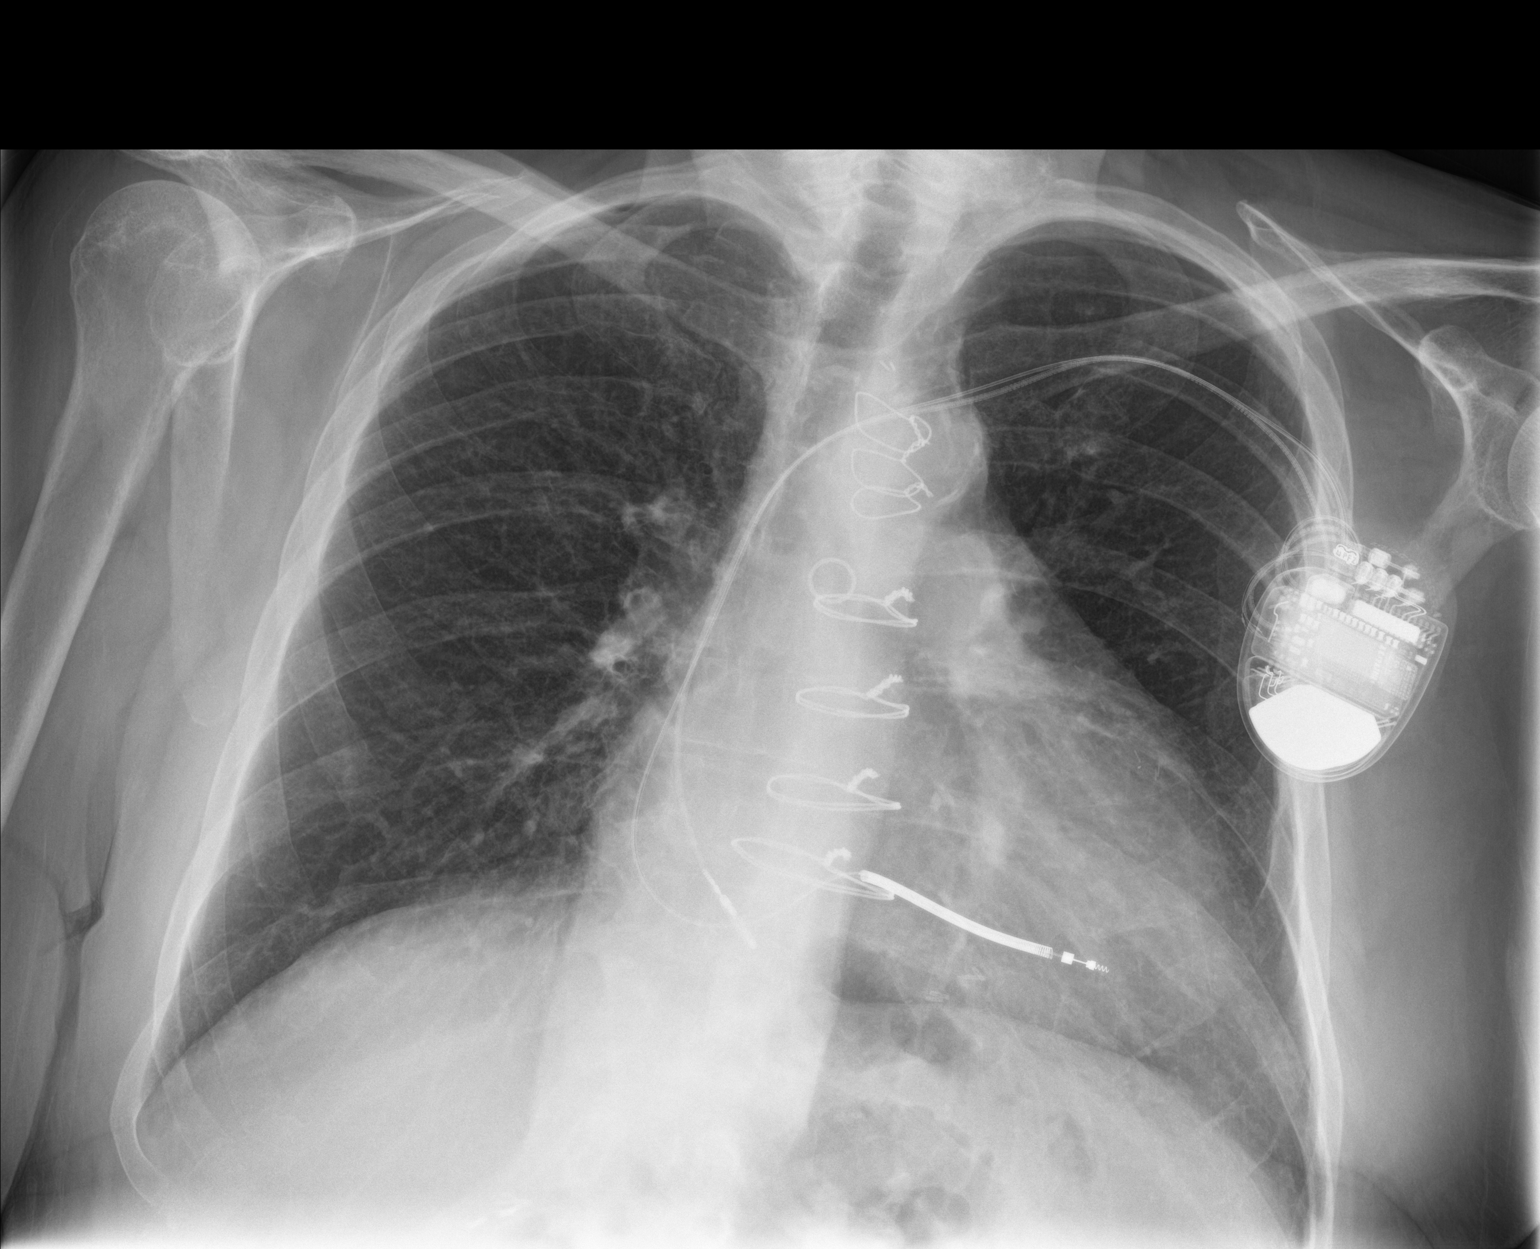

[chest lat]
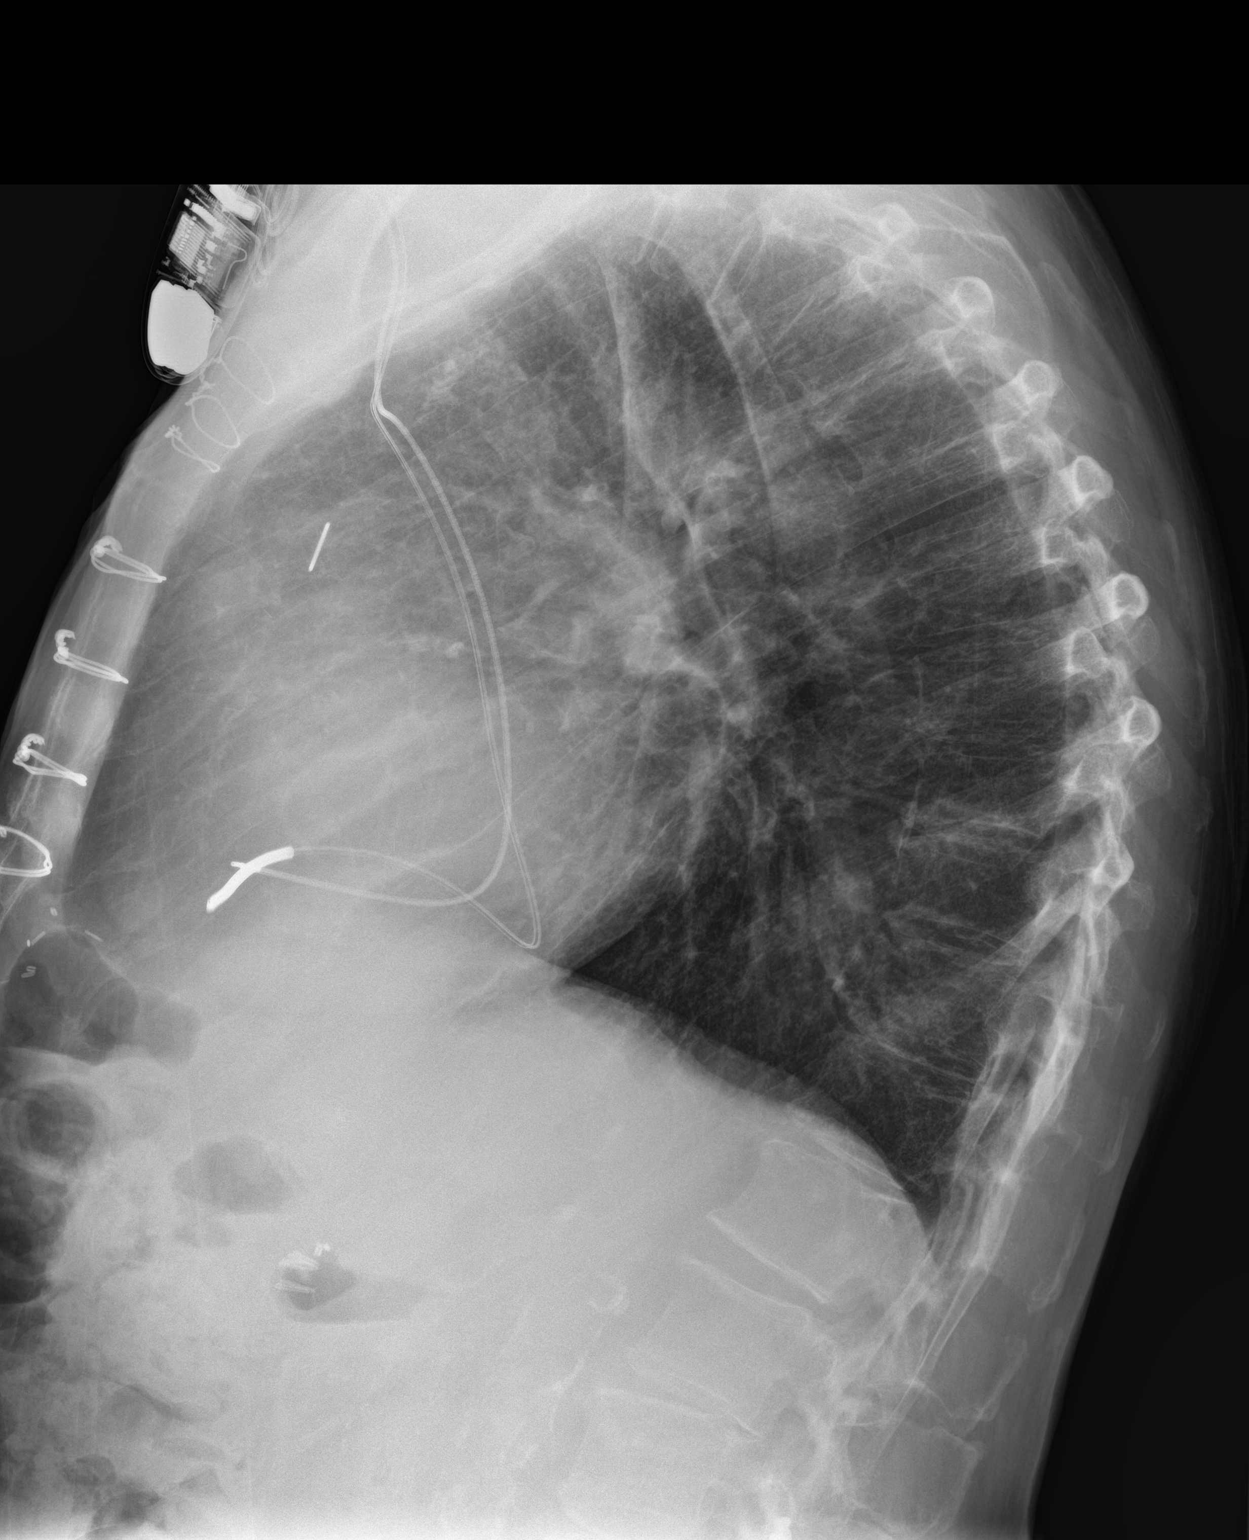

[chest pa (2 of 2)]
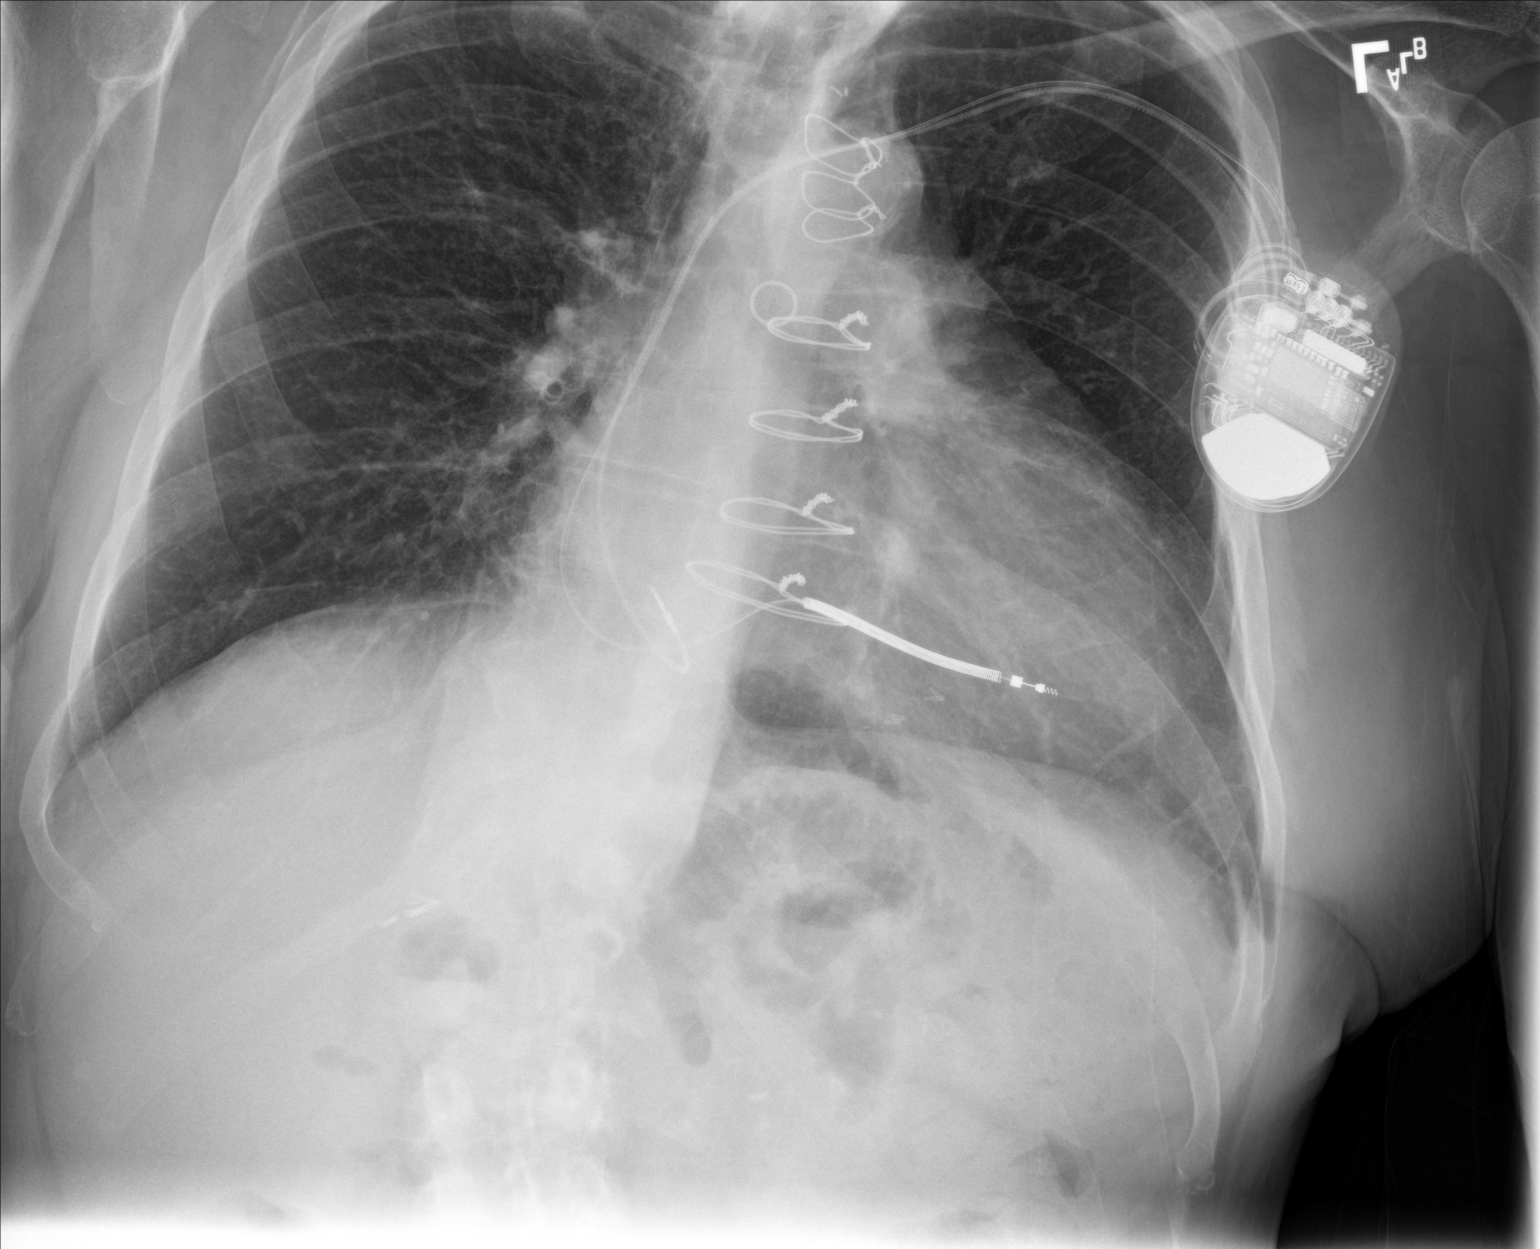

[3 of 3 positions shown; findings below may reference images not displayed]

FINDINGS: The heart is enlarged. Evidence of prior coronary artery bypass
grafting. Pacemaker leads in the right atrium and right ventricle.
Lungs are clear without evidence of focal consolidation or pleural
effusion. Thoracic spondylosis. No acute osseous abnormality.
IMPRESSION: 1. Stable cardiomegaly.
2. Lungs are clear without evidence of focal consolidation or
pleural effusion.

## 2023-09-18 ENCOUNTER — Encounter: Payer: Self-pay | Admitting: Student in an Organized Health Care Education/Training Program

## 2023-09-18 ENCOUNTER — Ambulatory Visit
Payer: No Typology Code available for payment source | Attending: Student in an Organized Health Care Education/Training Program | Admitting: Student in an Organized Health Care Education/Training Program

## 2023-09-18 ENCOUNTER — Ambulatory Visit
Admission: RE | Admit: 2023-09-18 | Discharge: 2023-09-18 | Disposition: A | Discharge status: Home / Self Care | Payer: Medicare Other | Source: Ambulatory Visit | Attending: Student in an Organized Health Care Education/Training Program | Admitting: Student in an Organized Health Care Education/Training Program

## 2023-09-18 DIAGNOSIS — G8929 Other chronic pain: Secondary | ICD-10-CM | POA: Diagnosis present

## 2023-09-18 DIAGNOSIS — G894 Chronic pain syndrome: Secondary | ICD-10-CM

## 2023-09-18 DIAGNOSIS — M5416 Radiculopathy, lumbar region: Secondary | ICD-10-CM

## 2023-09-18 DIAGNOSIS — M961 Postlaminectomy syndrome, not elsewhere classified: Secondary | ICD-10-CM | POA: Diagnosis present

## 2023-09-18 MED ORDER — FENTANYL CITRATE (PF) 100 MCG/2ML IJ SOLN
INTRAMUSCULAR | Status: AC
  Filled 2023-09-18: qty 2

## 2023-09-18 MED ORDER — ROPIVACAINE HCL 2 MG/ML IJ SOLN
INTRAMUSCULAR | Status: AC
  Filled 2023-09-18: qty 20

## 2023-09-18 MED ORDER — MIDAZOLAM HCL 5 MG/5ML IJ SOLN
0.5000 mg | Freq: Once | INTRAMUSCULAR | Status: AC
  Administered 2023-09-18: 1 mg via INTRAVENOUS

## 2023-09-18 MED ORDER — MIDAZOLAM HCL 5 MG/5ML IJ SOLN
INTRAMUSCULAR | Status: AC
  Filled 2023-09-18: qty 5

## 2023-09-18 MED ORDER — LIDOCAINE HCL (PF) 2 % IJ SOLN
INTRAMUSCULAR | Status: AC
  Filled 2023-09-18: qty 10

## 2023-09-18 MED ORDER — FENTANYL CITRATE (PF) 100 MCG/2ML IJ SOLN
25.0000 ug | INTRAMUSCULAR | Status: DC | PRN
Start: 2023-09-18 — End: 2023-09-18
  Administered 2023-09-18: 50 ug via INTRAVENOUS

## 2023-09-18 MED ORDER — LIDOCAINE HCL 2 % IJ SOLN
20.0000 mL | Freq: Once | INTRAMUSCULAR | Status: AC
  Administered 2023-09-18: 100 mg

## 2023-09-18 MED ORDER — LACTATED RINGERS IV SOLN: Freq: Once | INTRAVENOUS | Status: AC

## 2023-09-18 MED ORDER — CEFAZOLIN SODIUM 1 G IJ SOLR
INTRAMUSCULAR | Status: AC
  Filled 2023-09-18: qty 20

## 2023-09-18 MED ORDER — CEFAZOLIN SODIUM-DEXTROSE 2-4 GM/100ML-% IV SOLN
2.0000 g | Freq: Once | INTRAVENOUS | Status: AC
  Administered 2023-09-18: 2 g via INTRAVENOUS

## 2023-09-18 MED ORDER — ROPIVACAINE HCL 2 MG/ML IJ SOLN
18.0000 mL | Freq: Once | INTRAMUSCULAR | Status: AC
  Administered 2023-09-18: 18 mL via PERINEURAL

## 2023-09-18 MED ORDER — CEPHALEXIN 500 MG PO CAPS: 500.0000 mg | ORAL_CAPSULE | Freq: Four times a day (QID) | ORAL | 0 refills | Status: AC

## 2023-09-18 NOTE — Progress Notes (Signed)
 PROVIDER NOTE: Interpretation of information contained herein should be left to medically-trained personnel. Specific patient instructions are provided elsewhere under Patient Instructions section of medical record. This document was created in part using STT-dictation technology, any transcriptional errors that may result from this process are unintentional.  Patient: Danny Marquez Type: Established DOB: 1946/01/26 MRN: [id number] [facility]: [facility]  Service: Procedure DOS: 09/18/2023 Setting: Ambulatory Location: Ambulatory outpatient facility Delivery: Face-to-face Provider: Wallie Sherry, MD Specialty: Interventional Pain Management Specialty designation: 09 Location: Outpatient facility Ref. Prov.: Sherry Wallie, MD       Interventional Therapy   Primary Reason for Admission: Surgical management of chronic pain condition.   Procedure:              Type: MEDTRONIC Trial Spinal Cord Neurostimulator Implant (Percutaneous, interlaminar, posterior epidural placement) Laterality: Bilateral (-50)  Level: Lumbar  Imaging: Fluoroscopic guidance Anesthesia: Local anesthesia (1-2% Lidocaine ) Sedation: Minimal Sedation                       DOS: 09/18/2023  Performed by: Wallie Sherry, MD  Purpose: Diagnostic. To determine if a permanent implant may be effective in controlling some or all of Danny Marquez's chronic pain symptoms.  Rationale (medical necessity): procedure needed and proper for the diagnosis and/or treatment of Danny Marquez's medical symptoms and needs. 1. Failed back surgical syndrome   2. Lumbar radiculopathy   3. Chronic radicular lumbar pain   4. Chronic pain syndrome    NAS-11 Pain score:   Pre-procedure: 9 /10   Post-procedure: 9 /10     Target: Posterior epidural space over the dorsal columns of the spinal cord. Location: Posterior intraspinal canal Region: Thoracolumbar  Approach: Translaminar percutaneous  Type of procedure: Surgical   Position /  Prep / Materials:  Position: Prone  Prep solution: ChloraPrep (2% chlorhexidine  gluconate and 70% isopropyl alcohol) Prep Area: Entire  Posterior  Thoracolumbar  Area  Materials:  Tray: Implant tray Needle(s):  Type: Epidural  Gauge (G):  14 Length: regular  Qty: 1  H&P (Pre-op Assessment):  Danny Marquez is a 78 y.o. (year old), male patient, seen today for interventional treatment. He  has a past surgical history that includes Coronary artery bypass graft; Back surgery; CARDIOVERSION (N/A, 01/23/2017); CARDIOVERSION (N/A, 02/01/2017); Joint replacement; Colonoscopy with propofol  (N/A, 03/05/2019); and Esophagogastroduodenoscopy (egd) with propofol  (N/A, 03/05/2019).  Initial Vital Signs:  Pulse/EKG Rate: 85ECG Heart Rate: 71 (nsr) Temp: 97.9 F (36.6 C) Resp: 18 BP: 127/68 SpO2: 100 %  BMI: Estimated body mass index is 27.22 kg/m as calculated from the following:   Height as of this encounter: 5' 8 (1.727 m).   Weight as of this encounter: 179 lb (81.2 kg).  Risk Assessment: Allergies: Reviewed. He is allergic to codeine.  Allergy Precautions: None required Coagulopathies: Reviewed. None identified.  Blood-thinner therapy: None at this time Active Infection(s): Reviewed. None identified. Danny Marquez is afebrile  Site Confirmation: Danny Marquez was asked to confirm the procedure and laterality before marking the site, which he did. Procedure checklist: Completed Consent: Before the procedure and under the influence of no sedative(s), amnesic(s), or anxiolytics, the patient was informed of the treatment options, risks and possible complications. To fulfill our ethical and legal obligations, as recommended by the American Medical Association's Code of Ethics, I have informed the patient of my clinical impression; the nature and purpose of the treatment or procedure; the risks, benefits, and possible complications of the intervention; the alternatives, including doing  nothing; the risk(s)  and benefit(s) of the alternative treatment(s) or procedure(s); and the risk(s) and benefit(s) of doing nothing.  Danny Marquez was provided with information about the general risks and possible complications associated with most interventional procedures. These include, but are not limited to: failure to achieve desired goals, infection, bleeding, organ or nerve damage, allergic reactions, paralysis, and/or death.  In addition, he was informed of those risks and possible complications associated to this particular procedure, which include, but are not limited to: damage to the implant; failure to decrease pain; local, systemic, or serious CNS infections, intraspinal abscess with possible cord compression and paralysis, or life-threatening such as meningitis; intrathecal and/or epidural bleeding with formation of hematoma with possible spinal cord compression and permanent paralysis; organ damage; nerve injury or damage with subsequent sensory, motor, and/or autonomic system dysfunction, resulting in transient or permanent pain, numbness, and/or weakness of one or several areas of the body; allergic reactions, either minor or major life-threatening, such as anaphylactic or anaphylactoid reactions.  Furthermore, Danny Marquez was informed of those risks and complications associated with the medications. These include, but are not limited to: allergic reactions (i.e.: anaphylactic or anaphylactoid reactions); arrhythmia;  Hypotension/hypertension; cardiovascular collapse; respiratory depression and/or shortness of breath; swelling or edema; medication-induced neural toxicity; particulate matter embolism and blood vessel occlusion with resultant organ, and/or nervous system infarction and permanent paralysis.  Finally, he was informed that Medicine is not an exact science; therefore, there is also the possibility of unforeseen or unpredictable risks and/or possible complications that may result in a catastrophic  outcome. The patient indicated having understood very clearly. We have given the patient no guarantees and we have made no promises. Enough time was given to the patient to ask questions, all of which were answered to the patient's satisfaction. Mr. Fellman has indicated that he wanted to continue with the procedure. Attestation: I, the ordering provider, attest that I have discussed with the patient the benefits, risks, side-effects, alternatives, likelihood of achieving goals, and potential problems during recovery for the procedure that I have provided informed consent. Date  Time: 09/18/2023  7:50 AM  Pre-Procedure Preparation:  Monitoring: As per clinic protocol. Respiration, ETCO2, SpO2, BP, heart rate and rhythm monitor placed and checked for adequate function Safety Precautions: Patient was assessed for positional comfort and pressure points before starting the procedure. Time-out: I initiated and conducted the Time-out before starting the procedure, as per protocol. The patient was asked to participate by confirming the accuracy of the Time Out information. Verification of the correct person, site, and procedure were performed and confirmed by me, the nursing staff, and the patient. Time-out conducted as per Joint Commission's Universal Protocol (UP.01.01.01). Time: 9161 Start Time: 0838 hrs.  Description/Narrative of Procedure:          Rationale (medical necessity): procedure needed and proper for the diagnosis and/or treatment of the patient's medical symptoms and needs. Procedural Technique Safety Precautions: Aspiration looking for blood return was conducted prior to all injections. At no point did we inject any substances, as a needle was being advanced. No attempts were made at seeking any paresthesias. Safe injection practices and needle disposal techniques used. Medications properly checked for expiration dates. SDV (single dose vial) medications used. Description of the  Procedure: Protocol guidelines were followed. The patient was assisted into a comfortable position. The target area was identified and the area prepped in the usual manner. Skin & deeper tissues infiltrated with local anesthetic. Appropriate amount of time allowed to  pass for local anesthetics to take effect. The procedure needles were then advanced to the target area. Proper needle placement secured. Negative aspiration confirmed. Solution injected in intermittent fashion, asking for systemic symptoms every 0.5cc of injectate. The needles were then removed and the area cleansed, making sure to leave some of the prepping solution back to take advantage of its long term bactericidal properties.  Technical description of procedure: Availability of a responsible, adult driver, and NPO status confirmed. Informed consent was obtained after having discussed risks and possible complications. An IV was started. The patient was then taken to the fluoroscopy suite, where the patient was placed in position for the procedure, over the fluoroscopy table. The patient was then monitored in the usual manner. Fluoroscopy was manipulated to obtain the best possible view of the target. Parallex error was corrected before commencing the procedure. Once a clear view of the target had been obtained, the skin and deeper tissues over the procedure site were infiltrated using lidocaine , loaded in a 10 cc luer-loc syringe with a 0.5 inch, 25-G needle. The introducer needle(s) was/were then inserted through the skin and deeper tissues. A paramidline approach was used to enter the posterior epidural space at a 30 angle, using "Loss-of-resistance Technique" with 3 ml of PF-NaCl (0.9% NSS). Correct needle placement was confirmed in the antero-posterior and lateral fluoroscopic views. The lead was gently introduced and manipulated under real-time fluoroscopy, constantly assessing for pain, discomfort, or paresthesias, until the tip rested at  the desired level. Both sides were done in identical fashion. Electrode placement was tested until appropriate coverage was attained. Once the patient confirmed that the stimulation was over the desired area, the lead(s) was/were secured in place and the introducer needles removed. This was done under real-time fluoroscopy while observing the electrode tip to avoid unintended migration. The area was covered with a non-occlusive dressing and the patient transported to recovery for further programming.  Vitals:   09/18/23 0908 09/18/23 0924 09/18/23 0934 09/18/23 0945  BP: (!) 105/57 119/66 123/73 127/75  Pulse:      Resp: 18 16 16 16   Temp:      SpO2: 100% 98% 98% 97%  Weight:      Height:        Start Time: 0838 hrs. End Time: 0908 hrs.  Neurostimulator Details:   Lead(s):  Brand: Medtronic         Epidural Access Level:  T12-L1 T12-L1  Lead implant:  Bilateral   No. of Electrodes/Lead:  8 8  Laterality:  Left Right  Top electrode location:  Mid T7 T8  Bottom electrode location:  Top of T9 T10  Model No.: [id number] Same  Length: 60 cm Same  Lot No.: [id number]   Imaging Guidance (Spinal):          Type of Imaging Technique: Fluoroscopy Guidance (Spinal) Indication(s): Fluoroscopy guidance for needle placement to enhance accuracy in procedures requiring precise needle localization for targeted delivery of medication in or near specific anatomical locations not easily accessible without such real-time imaging assistance. Exposure Time: Please see nurses notes. Contrast: None used. Fluoroscopic Guidance: I was personally present during the use of fluoroscopy. Tunnel Vision Technique used to obtain the best possible view of the target area. Parallax error corrected before commencing the procedure. Direction-depth-direction technique used to introduce the needle under continuous pulsed fluoroscopy. Once target was reached, antero-posterior, oblique, and lateral  fluoroscopic projection used confirm needle placement in all planes. Images permanently stored in EMR. Interpretation: No  contrast injected. I personally interpreted the imaging intraoperatively. Adequate needle placement confirmed in multiple planes. Permanent images saved into the patient's record.     Antibiotic Prophylaxis:   Anti-infectives (From admission, onward)    Start     Dose/Rate Route Frequency Ordered Stop   09/18/23 0845  ceFAZolin  (ANCEF ) IVPB 2g/100 mL premix        2 g 200 mL/hr over 30 Minutes Intravenous  Once 09/18/23 0831 09/18/23 0850   09/18/23 0000  cephALEXin  (KEFLEX ) 500 MG capsule        500 mg Oral 4 times daily 09/18/23 0819 09/25/23 2359      Indication(s): Procedural Prophylaxis. SCS trial  Post-operative Assessment:  Post-procedure Vital Signs:  Pulse/HCG Rate: 85(!) 56 Temp: 97.9 F (36.6 C) Resp: 16 BP: 127/75 SpO2: 97 %  Complications: No immediate post-treatment complications observed by team, or reported by patient.  Note: The patient tolerated the entire procedure well. A repeat set of vitals were taken after the procedure and the patient was kept under observation following institutional policy, for this type of procedure. Post-procedural neurological assessment was performed, showing return to baseline, prior to discharge. The patient was provided with post-procedure discharge instructions, including a section on how to identify potential problems. Should any problems arise concerning this procedure, the patient was given instructions to immediately contact us , at any time, without hesitation. In any case, we plan to contact the patient by telephone for a follow-up status report regarding this interventional procedure.  Comments:  No additional relevant information.  Plan of Care  Orders:  Orders Placed This Encounter  Procedures   DG PAIN CL[facility] C-ARM 1-60 MIN NO REPORT    Intraoperative interpretation by procedural physician at  [facility].    Standing Status:   Standing    Number of Occurrences:   1    Reason for exam::   Assistance in needle guidance and placement for procedures requiring needle placement in or near specific anatomical locations not easily accessible without such assistance.    Medications administered: We administered lidocaine , lactated ringers , midazolam , fentaNYL , ropivacaine  (PF) 2 mg/mL (0.2%), and ceFAZolin .  See the medical record for exact dosing, route, and time of administration.  Follow-up plan:   Return in about 1 week (around 09/25/2023) for SCS lead pull.     Recent Visits Date Type Provider Dept  06/20/23 Office Visit Marcelino Nurse, MD [facility]  Showing recent visits within past 90 days and meeting all other requirements Today's Visits Date Type Provider Dept  09/18/23 Procedure visit Marcelino Nurse, MD [facility]  Showing today's visits and meeting all other requirements Future Appointments Date Type Provider Dept  09/25/23 Appointment Marcelino Nurse, MD [facility]  Showing future appointments within next 90 days and meeting all other requirements  Disposition: Discharge home  Discharge (Date  Time): 09/18/2023; 1030 hrs.   Primary Care Physician: [facility] Location[facility] Note by: Nurse Marcelino, MD (TTS technology used. I apologize for any typographical errors that were not detected and corrected.) Date: 09/18/2023; Time: 10:46 AM

## 2023-09-18 NOTE — Progress Notes (Signed)
 Safety precautions to be maintained throughout the outpatient stay will include: orient to surroundings, keep bed in low position, maintain call bell within reach at all times, provide assistance with transfer out of bed and ambulation.

## 2023-09-18 NOTE — Patient Instructions (Addendum)
 Today we did the following -We have done a Spinal Cord Stimulator Trial with Medtronic  -As long as the leads are in place, do not bathe or shower. You may sponge bathe.  -While the lead is in place, please limit the bending, lifting, or twisting because the lead can move.  -The things we want to see is if your pain improves (and by what percentage), if you can do more activity (don't overdo it), and if you can use less of your as needed medicine. Do not stop long acting medicines like methadone, oxycontin , MS Contin , etc without checking with us .  -It is VERY important that you pick up the antibiotics we prescribed, Keflex , on your way home from the trial and take them as prescribed(4 times a day), starting today, for as long as the lead is in place.  -The Spina Cord Stimulator Representative will be in contact with you while the lead is in place to make sure the trial goes as well as possible.  -Please contact us  with any questions or concerns at any time during the trial.   -If you start running a fever over 100 degrees, have severe back pain, or new pain running down the legs, or drainage coming from the lead site, contact us  immediately and/or go to the emergency room.  -Please do not restart any sort of medication that can thin your blood such as Aspirin , ibuprofen, motrin, aleve, plavix, coumadin, etc. If you aren't sure, call and ask.  -We will have you return on Monday 1/13 to have the lead removed. If this is successful, at that point we can go over the details about the permanent implant.

## 2023-09-19 ENCOUNTER — Telehealth: Payer: Self-pay

## 2023-09-19 NOTE — Telephone Encounter (Signed)
Post procedure follow up.  Patients wife states he is doing ok.

## 2023-09-24 ENCOUNTER — Encounter
Payer: No Typology Code available for payment source | Admitting: Student in an Organized Health Care Education/Training Program

## 2023-09-25 ENCOUNTER — Ambulatory Visit
Admission: RE | Admit: 2023-09-25 | Discharge: 2023-09-25 | Disposition: A | Discharge status: Home / Self Care | Payer: Non-veteran care | Source: Ambulatory Visit | Attending: Student in an Organized Health Care Education/Training Program | Admitting: Student in an Organized Health Care Education/Training Program

## 2023-09-25 ENCOUNTER — Ambulatory Visit (HOSPITAL_BASED_OUTPATIENT_CLINIC_OR_DEPARTMENT_OTHER): Payer: Non-veteran care | Admitting: Student in an Organized Health Care Education/Training Program

## 2023-09-25 ENCOUNTER — Encounter: Payer: Self-pay | Admitting: Student in an Organized Health Care Education/Training Program

## 2023-09-25 ENCOUNTER — Other Ambulatory Visit: Payer: Self-pay | Admitting: Student in an Organized Health Care Education/Training Program

## 2023-09-25 VITALS — BP 123/67 | HR 52 | Temp 97.4°F | Ht 69.0 in | Wt 182.0 lb

## 2023-09-25 DIAGNOSIS — G894 Chronic pain syndrome: Secondary | ICD-10-CM

## 2023-09-25 DIAGNOSIS — M5416 Radiculopathy, lumbar region: Secondary | ICD-10-CM

## 2023-09-25 DIAGNOSIS — R52 Pain, unspecified: Secondary | ICD-10-CM

## 2023-09-25 DIAGNOSIS — G8929 Other chronic pain: Secondary | ICD-10-CM

## 2023-09-25 DIAGNOSIS — M961 Postlaminectomy syndrome, not elsewhere classified: Secondary | ICD-10-CM

## 2023-09-25 NOTE — Progress Notes (Signed)
 Safety precautions to be maintained throughout the outpatient stay will include: orient to surroundings, keep bed in low position, maintain call bell within reach at all times, provide assistance with transfer out of bed and ambulation.

## 2023-09-25 NOTE — Patient Instructions (Addendum)
 99   ______________________________________________________________________    Post-Procedure Discharge Instructions  Instructions: Apply ice:  Purpose: This will minimize any swelling and discomfort after procedure.  When: Day of procedure, as soon as you get home. How: Fill a plastic sandwich bag with crushed ice. Cover it with a small towel and apply to injection site. How long: (15 min on, 15 min off) Apply for 15 minutes then remove x 15 minutes.  Repeat sequence on day of procedure, until you go to bed. Apply heat:  Purpose: To treat any soreness and discomfort from the procedure. When: Starting the next day after the procedure. How: Apply heat to procedure site starting the day following the procedure. How long: May continue to repeat daily, until discomfort goes away. Food intake: Start with clear liquids (like water ) and advance to regular food, as tolerated.  Physical activities: Keep activities to a minimum for the first 8 hours after the procedure. After that, then as tolerated. Driving: If you have received any sedation, be responsible and do not drive. You are not allowed to drive for 24 hours after having sedation. Blood thinner: (Applies only to those taking blood thinners) You may restart your blood thinner 6 hours after your procedure. Insulin: (Applies only to Diabetic patients taking insulin) As soon as you can eat, you may resume your normal dosing schedule. Infection prevention: Keep procedure site clean and dry. Shower daily and clean area with soap and water . Post-procedure Pain Diary: Extremely important that this be done correctly and accurately. Recorded information will be used to determine the next step in treatment. For the purpose of accuracy, follow these rules: Evaluate only the area treated. Do not report or include pain from an untreated area. For the purpose of this evaluation, ignore all other areas of pain, except for the treated area. After your  procedure, avoid taking a long nap and attempting to complete the pain diary after you wake up. Instead, set your alarm clock to go off every hour, on the hour, for the initial 8 hours after the procedure. Document the duration of the numbing medicine, and the relief you are getting from it. Do not go to sleep and attempt to complete it later. It will not be accurate. If you received sedation, it is likely that you were given a medication that may cause amnesia. Because of this, completing the diary at a later time may cause the information to be inaccurate. This information is needed to plan your care. Follow-up appointment: Keep your post-procedure follow-up evaluation appointment after the procedure (usually 2 weeks for most procedures, 6 weeks for radiofrequencies). DO NOT FORGET to bring you pain diary with you.   Expect: (What should I expect to see with my procedure?) From numbing medicine (AKA: Local Anesthetics): Numbness or decrease in pain. You may also experience some weakness, which if present, could last for the duration of the local anesthetic. Onset: Full effect within 15 minutes of injected. Duration: It will depend on the type of local anesthetic used. On the average, 1 to 8 hours.  From steroids (Applies only if steroids were used): Decrease in swelling or inflammation. Once inflammation is improved, relief of the pain will follow. Onset of benefits: Depends on the amount of swelling present. The more swelling, the longer it will take for the benefits to be seen. In some cases, up to 10 days. Duration: Steroids will stay in the system x 2 weeks. Duration of benefits will depend on multiple posibilities including persistent irritating  factors. Side-effects: If present, they may typically last 2 weeks (the duration of the steroids). Frequent: Cramps (if they occur, drink Gatorade and take over-the-counter Magnesium 450-500 mg once to twice a day); water  retention with temporary weight  gain; increases in blood sugar; decreased immune system response; increased appetite. Occasional: Facial flushing (red, warm cheeks); mood swings; menstrual changes. Uncommon: Long-term decrease or suppression of natural hormones; bone thinning. (These are more common with higher doses or more frequent use. This is why we prefer that our patients avoid having any injection therapies in other practices.)  Very Rare: Severe mood changes; psychosis; aseptic necrosis. From procedure: Some discomfort is to be expected once the numbing medicine wears off. This should be minimal if ice and heat are applied as instructed.  Call if: (When should I call?) You experience numbness and weakness that gets worse with time, as opposed to wearing off. New onset bowel or bladder incontinence. (Applies only to procedures done in the spine)  Emergency Numbers: Durning business hours (Monday - Thursday, 8:00 AM - 4:00 PM) (Friday, 9:00 AM - 12:00 Noon): [phone] After hours: [phone] NOTE: If you are having a problem and are unable connect with, or to talk to a provider, then go to your nearest urgent care or emergency department. If the problem is serious and urgent, please call 911. ______________________________________________________________________      ______________________________________________________________________    Post-Procedure Discharge Instructions  Instructions: Apply ice:  Purpose: This will minimize any swelling and discomfort after procedure.  When: Day of procedure, as soon as you get home. How: Fill a plastic sandwich bag with crushed ice. Cover it with a small towel and apply to injection site. How long: (15 min on, 15 min off) Apply for 15 minutes then remove x 15 minutes.  Repeat sequence on day of procedure, until you go to bed. Apply heat:  Purpose: To treat any soreness and discomfort from the procedure. When: Starting the next day after the procedure. How: Apply  heat to procedure site starting the day following the procedure. How long: May continue to repeat daily, until discomfort goes away. Food intake: Start with clear liquids (like water ) and advance to regular food, as tolerated.  Physical activities: Keep activities to a minimum for the first 8 hours after the procedure. After that, then as tolerated. Driving: If you have received any sedation, be responsible and do not drive. You are not allowed to drive for 24 hours after having sedation. Blood thinner: (Applies only to those taking blood thinners) You may restart your blood thinner 6 hours after your procedure. Insulin: (Applies only to Diabetic patients taking insulin) As soon as you can eat, you may resume your normal dosing schedule. Infection prevention: Keep procedure site clean and dry. Shower daily and clean area with soap and water . Post-procedure Pain Diary: Extremely important that this be done correctly and accurately. Recorded information will be used to determine the next step in treatment. For the purpose of accuracy, follow these rules: Evaluate only the area treated. Do not report or include pain from an untreated area. For the purpose of this evaluation, ignore all other areas of pain, except for the treated area. After your procedure, avoid taking a long nap and attempting to complete the pain diary after you wake up. Instead, set your alarm clock to go off every hour, on the hour, for the initial 8 hours after the procedure. Document the duration of the numbing medicine, and the relief you are  getting from it. Do not go to sleep and attempt to complete it later. It will not be accurate. If you received sedation, it is likely that you were given a medication that may cause amnesia. Because of this, completing the diary at a later time may cause the information to be inaccurate. This information is needed to plan your care. Follow-up appointment: Keep your post-procedure follow-up  evaluation appointment after the procedure (usually 2 weeks for most procedures, 6 weeks for radiofrequencies). DO NOT FORGET to bring you pain diary with you.   Expect: (What should I expect to see with my procedure?) From numbing medicine (AKA: Local Anesthetics): Numbness or decrease in pain. You may also experience some weakness, which if present, could last for the duration of the local anesthetic. Onset: Full effect within 15 minutes of injected. Duration: It will depend on the type of local anesthetic used. On the average, 1 to 8 hours.  From steroids (Applies only if steroids were used): Decrease in swelling or inflammation. Once inflammation is improved, relief of the pain will follow. Onset of benefits: Depends on the amount of swelling present. The more swelling, the longer it will take for the benefits to be seen. In some cases, up to 10 days. Duration: Steroids will stay in the system x 2 weeks. Duration of benefits will depend on multiple posibilities including persistent irritating factors. Side-effects: If present, they may typically last 2 weeks (the duration of the steroids). Frequent: Cramps (if they occur, drink Gatorade and take over-the-counter Magnesium 450-500 mg once to twice a day); water  retention with temporary weight gain; increases in blood sugar; decreased immune system response; increased appetite. Occasional: Facial flushing (red, warm cheeks); mood swings; menstrual changes. Uncommon: Long-term decrease or suppression of natural hormones; bone thinning. (These are more common with higher doses or more frequent use. This is why we prefer that our patients avoid having any injection therapies in other practices.)  Very Rare: Severe mood changes; psychosis; aseptic necrosis. From procedure: Some discomfort is to be expected once the numbing medicine wears off. This should be minimal if ice and heat are applied as instructed.  Call if: (When should I call?) You  experience numbness and weakness that gets worse with time, as opposed to wearing off. New onset bowel or bladder incontinence. (Applies only to procedures done in the spine)  Emergency Numbers: Durning business hours (Monday - Thursday, 8:00 AM - 4:00 PM) (Friday, 9:00 AM - 12:00 Noon): [phone] After hours: [phone] NOTE: If you are having a problem and are unable connect with, or to talk to a provider, then go to your nearest urgent care or emergency department. If the problem is serious and urgent, please call 911. ______________________________________________________________________

## 2023-09-25 NOTE — Progress Notes (Signed)
 Patient: Danny Marquez  Service Category: Procedure  Provider: Cephus Collin, MD  DOB: 1945-09-20  DOS: 09/25/2023  Referring Provider: [facility]  MRN: [id number]  Setting: Ambulatory outpatient  [facility]  Type: Established Patient  Specialty: Interventional Pain Management    Location: Office  Delivery: Face-to-face  SCS TRIAL POST-OP EVALUATION   Primary Reason(s) for Visit: Encounter for removal of temporary spinal cord stimulator lead(s) and evaluation of trial implant. CC: Hip Pain (right)  HPI  Danny Marquez is a 78 y.o. year old, male patient, who comes today for a post-procedure evaluation. He has Atrial fibrillation with RVR (HCC); Atrial fibrillation (HCC); Anemia; Burning sensation of feet; Chronic back pain; Chronic systolic congestive heart failure (HCC); Coronary arteriosclerosis; Gastroesophageal reflux disease; Generalized ischemic myocardial dysfunction; History of heart disorder; History of myocardial infarction; History of pancreatitis; Hyperlipidemia; Hypertension; Numbness and tingling of foot; Spinal stenosis, lumbar region, with neurogenic claudication; Weakness of foot; History of coronary artery bypass surgery; History of lumbar fusion; Chronic radicular lumbar pain; S/P fusion of sacroiliac joint (right); Chronic pain syndrome; Chronic right SI joint pain; Lumbar spondylosis; and Failed back surgical syndrome on their problem list. His primarily concern today is the Hip Pain (right)  Pain Assessment: Location: Right Hip Radiating: denies Onset: More than a month ago Duration: Chronic pain Quality: Aching Severity: 9 /10 (subjective, self-reported pain score)  Effect on ADL: limtis ADLs Timing: Constant Modifying factors: denies BP: 123/67  HR: (!) 52  Danny Marquez comes in today, after a SCS (Spinal Cord Stimulator) Trial Implant on 09/19/2023, to have his percutaneous, temporary neurostimulator lead(s) removed and to evaluate the trial  experience to determine if a permanent implant may be effective in controlling some or all of his chronic pain symptoms.  Patient endorses 70% pain relief during SCS trial and improvement in performing ADLs more comfortably. Wants to proceed with permament implant.   Further details on both, my assessment(s), as well as the proposed treatment plan, please see below.  Post-operative Assessment  Intra-procedural problems/complications: None observed.         Reported side-effects: None.        Post-surgical adverse reactions or complications: None reported         Laboratory Chemistry Profile   Renal Lab Results  Component Value Date   BUN 24 (H) 03/03/2022   CREATININE 1.45 (H) 03/03/2022   GFRAA >60 08/06/2019   GFRNONAA 50 (L) 03/03/2022   PROTEINUR NEGATIVE 03/03/2022     Electrolytes Lab Results  Component Value Date   NA 139 03/03/2022   K 4.3 03/03/2022   CL 110 03/03/2022   CALCIUM  9.8 03/03/2022   MG 2.1 05/20/2021     Hepatic Lab Results  Component Value Date   AST 45 (H) 03/03/2022   ALT 17 03/03/2022   ALBUMIN 3.1 (L) 03/03/2022   ALKPHOS 85 03/03/2022   LIPASE 356 (H) 08/06/2019   AMMONIA 29 08/06/2019     ID Lab Results  Component Value Date   SARSCOV2NAA NOT DETECTED 03/02/2019     Bone No results found for: "VD25OH", "VD125OH2TOT", "HQ4696EX5", "MW4132GM0", "25OHVITD1", "25OHVITD2", "25OHVITD3", "TESTOFREE", "TESTOSTERONE"   Endocrine Lab Results  Component Value Date   GLUCOSE 113 (H) 03/03/2022   GLUCOSEU >=500 (A) 03/03/2022   TSH 2.305 12/08/2016     Neuropathy No results found for: "VITAMINB12", "FOLATE", "HGBA1C", "HIV"   CNS No results found for: "COLORCSF", "APPEARCSF", "RBCCOUNTCSF", "WBCCSF", "POLYSCSF", "LYMPHSCSF", "EOSCSF", "PROTEINCSF", "GLUCCSF", "JCVIRUS", "CSFOLI", "IGGCSF", "LABACHR", "  ACETBL"   Inflammation (CRP: Acute  ESR: Chronic) Lab Results  Component Value Date   LATICACIDVEN 1.1 08/06/2019      Rheumatology No results found for: "RF", "ANA", "LABURIC", "URICUR", "LYMEIGGIGMAB", "LYMEABIGMQN", "HLAB27"   Coagulation Lab Results  Component Value Date   INR 1.05 12/08/2016   LABPROT 13.7 12/08/2016   APTT 32 12/08/2016   PLT 205 03/03/2022     Cardiovascular Lab Results  Component Value Date   BNP 279.7 (H) 03/03/2022   TROPONINI <0.03 03/02/2017   HGB 12.4 (L) 03/03/2022   HCT 38.9 (L) 03/03/2022     Screening Lab Results  Component Value Date   SARSCOV2NAA NOT DETECTED 03/02/2019   COVIDSOURCE NASOPHARYNGEAL 03/02/2019     Cancer No results found for: "CEA", "CA125", "LABCA2"   Allergens No results found for: "ALMOND", "APPLE", "ASPARAGUS", "AVOCADO", "BANANA", "BARLEY", "BASIL", "BAYLEAF", "GREENBEAN", "LIMABEAN", "WHITEBEAN", "BEEFIGE", "REDBEET", "BLUEBERRY", "BROCCOLI", "CABBAGE", "MELON", "CARROT", "CASEIN", "CASHEWNUT", "CAULIFLOWER", "CELERY"     Note: Lab results reviewed.   Meds   Current Outpatient Medications:    allopurinol  (ZYLOPRIM ) 100 MG tablet, Take 200 mg by mouth daily., Disp: , Rfl:    aspirin  EC 81 MG tablet, Take 81 mg by mouth daily. Swallow whole., Disp: , Rfl:    cephALEXin  (KEFLEX ) 500 MG capsule, Take 1 capsule (500 mg total) by mouth 4 (four) times daily for 7 days., Disp: 28 capsule, Rfl: 0   DONEPEZIL  HCL PO, Take 10 mg by mouth., Disp: , Rfl:    empagliflozin (JARDIANCE) 25 MG TABS tablet, Take 12.5 mg by mouth daily., Disp: , Rfl:    ferrous gluconate (FERGON) 324 MG tablet, Take 324 mg by mouth daily with breakfast. Patient taking M,W,F, Disp: , Rfl:    losartan  (COZAAR ) 25 MG tablet, Take 12.5 mg by mouth daily., Disp: , Rfl:    metoprolol  succinate (TOPROL -XL) 25 MG 24 hr tablet, Take 0.5 tablets by mouth daily., Disp: , Rfl:    metoprolol  tartrate (LOPRESSOR ) 25 MG tablet, Take 12.5 mg by mouth daily., Disp: , Rfl:    oxyCODONE -acetaminophen  (PERCOCET) 10-325 MG tablet, Take 1 tablet by mouth every 12 (twelve) hours as needed  for pain., Disp: , Rfl:    pantoprazole  (PROTONIX ) 20 MG tablet, Take 20 mg by mouth daily., Disp: , Rfl:    pregabalin (LYRICA) 150 MG capsule, Take 150 mg by mouth 2 (two) times daily., Disp: , Rfl:    ranolazine  (RANEXA ) 500 MG 12 hr tablet, Take 500 mg by mouth 2 (two) times daily., Disp: , Rfl:    rosuvastatin  (CRESTOR ) 5 MG tablet, , Disp: , Rfl:    sertraline  (ZOLOFT ) 100 MG tablet, Take 100 mg by mouth daily. , Disp: , Rfl:    torsemide (DEMADEX) 20 MG tablet, Take 20 mg by mouth daily as needed., Disp: , Rfl:    TRAZODONE  HCL PO, Take 75 mg by mouth at bedtime., Disp: , Rfl:   ROS  Constitutional: Denies any fever or chills Gastrointestinal: No reported hemesis, hematochezia, vomiting, or acute GI distress Musculoskeletal: Denies any acute onset joint swelling, redness, loss of ROM, or weakness Neurological: No reported episodes of acute onset apraxia, aphasia, dysarthria, agnosia, amnesia, paralysis, loss of coordination, or loss of consciousness  Allergies  Danny Marquez is allergic to codeine.  PFSH  Drug: Danny Marquez  reports that he does not currently use drugs. Alcohol:  reports that he does not currently use alcohol after a past usage of about 24.0 standard drinks of alcohol per  week. Tobacco:  reports that he has quit smoking. His smoking use included cigarettes. He has never used smokeless tobacco. Medical:  has a past medical history of A-fib (HCC), CHF (congestive heart failure) (HCC), Chronic kidney disease, Hypertension, Myocardial infarction (HCC), and Solitary kidney. Surgical: Danny Marquez  has a past surgical history that includes Coronary artery bypass graft; Back surgery; CARDIOVERSION (N/A, 01/23/2017); CARDIOVERSION (N/A, 02/01/2017); Joint replacement; Colonoscopy with propofol  (N/A, 03/05/2019); and Esophagogastroduodenoscopy (egd) with propofol  (N/A, 03/05/2019). Family: family history includes Heart attack in his father.  Postop Exam  General appearance: Afebrile.  Well nourished, well developed, and well hydrated. In no apparent acute distress. Vitals:   09/25/23 1151 09/25/23 1154  BP:  123/67  Pulse: (!) 52   Temp: (!) 97.4 F (36.3 C)   SpO2: 95%   Weight: 182 lb (82.6 kg)   Height: 5\' 9"  (1.753 m)    BMI Assessment: Estimated body mass index is 26.88 kg/m as calculated from the following:   Height as of this encounter: 5\' 9"  (1.753 m).   Weight as of this encounter: 182 lb (82.6 kg).  Surgical site: Wound is healing well. No redness, tenderness, discharge, abnormal odors, or any other evidence of infection or complications.  SCS trial leads removed, tips intact Assessment  Primary Diagnosis & Pertinent Problem List: The primary encounter diagnosis was Failed back surgical syndrome. Diagnoses of Lumbar radiculopathy, Chronic radicular lumbar pain, and Chronic pain syndrome were also pertinent to this visit.  Diagnosis  1. Failed back surgical syndrome   2. Lumbar radiculopathy   3. Chronic radicular lumbar pain   4. Chronic pain syndrome      Plan of Care  Orders:  Orders Placed This Encounter  Procedures   Ambulatory referral to Neurosurgery    Referral Priority:   Routine    Referral Type:   Surgical    Referral Reason:   Specialty Services Required    Referred to Provider:   Jodeen Munch, MD    Requested Specialty:   Neurosurgery    Number of Visits Requested:   1   Medications administered: Danny Marquez had no medications administered during this visit.  See the medical record for exact dosing, route, and time of administration.  Follow-up plan:   Return for patient will call to schedule F2F appt prn.       Right L1,2,3 RFA 04/17/23    Recent Visits Date Type Provider Dept  09/18/23 Procedure visit Cephus Collin, MD [facility]  Showing recent visits within past 90 days and meeting all other requirements Today's Visits Date Type Provider Dept  09/25/23 Procedure visit Cephus Collin, MD  [facility]  Showing today's visits and meeting all other requirements Future Appointments No visits were found meeting these conditions. Showing future appointments within next 90 days and meeting all other requirements  Disposition: Discharge home  Discharge (Date  Time): 09/25/2023;   hrs.   Primary Care Physician: [facility] Location[facility] Note by: Cephus Collin, MD (TTS technology used. I apologize for any typographical errors that were not detected and corrected.) Date: 09/25/2023; Time: 12:19 PM

## 2023-10-04 NOTE — Progress Notes (Unsigned)
Referring Physician:  [facility] [address] [place],  [place] [address]  Primary Physician:  [facility]  History of Present Illness: 10/07/2023 [name] is here today with a chief complaint of chronic back pain and bilateral hip pain who has been managed by our pain team.  He recently underwent a trial placement for a spinal cord stimulator and had a 70% improvement in his symptoms.  He is very optimistic about moving forward with a permanent placement.  They state that they have had their psych evaluation and have also had their previous trial done.  The symptoms are causing a significant impact on the patient's life.   I have utilized the care everywhere function in epic to review the outside records available from external health systems.  Review of Systems:  A 10 point review of systems is negative, except for the pertinent positives and negatives detailed in the HPI.  Past Medical History: Past Medical History:  Diagnosis Date   A-fib (HCC)    CHF (congestive heart failure) (HCC)    EF 30%   Chronic kidney disease    congenital one kidney   Hypertension    Myocardial infarction [facility])    Solitary kidney     Past Surgical History: Past Surgical History:  Procedure Laterality Date   BACK SURGERY     CARDIAC DEFIBRILLATOR PLACEMENT     CARDIOVERSION N/A 01/23/2017   Procedure: CARDIOVERSION;  Surgeon: Dalia Heading, MD;  Location: [facility] ORS;  Service: Cardiovascular;  Laterality: N/A;   CARDIOVERSION N/A 02/01/2017   Procedure: CARDIOVERSION;  Surgeon: Dalia Heading, MD;  Location: [facility] ORS;  Service: Cardiovascular;  Laterality: N/A;   COLONOSCOPY WITH PROPOFOL N/A 03/05/2019   Procedure: COLONOSCOPY WITH PROPOFOL;  Surgeon: Christena Deem, MD;  Location: [facility] ENDOSCOPY;  Service: Endoscopy;  Laterality: N/A;   CORONARY ARTERY BYPASS GRAFT     ESOPHAGOGASTRODUODENOSCOPY (EGD) WITH PROPOFOL N/A 03/05/2019   Procedure:  ESOPHAGOGASTRODUODENOSCOPY (EGD) WITH PROPOFOL;  Surgeon: Christena Deem, MD;  Location: [facility] ENDOSCOPY;  Service: Endoscopy;  Laterality: N/A;   JOINT REPLACEMENT     both knees 13 years ago, partial shoulder right    Allergies: Allergies as of 10/07/2023 - Review Complete 10/07/2023  Allergen Reaction Noted   Codeine Nausea Only 04/05/2015    Medications:  Current Outpatient Medications:    allopurinol (ZYLOPRIM) 100 MG tablet, Take 200 mg by mouth daily., Disp: , Rfl:    aspirin EC 81 MG tablet, Take 81 mg by mouth daily. Swallow whole., Disp: , Rfl:    DONEPEZIL HCL PO, Take 10 mg by mouth., Disp: , Rfl:    empagliflozin (JARDIANCE) 25 MG TABS tablet, Take 12.5 mg by mouth daily., Disp: , Rfl:    ferrous gluconate (FERGON) 324 MG tablet, Take 324 mg by mouth daily with breakfast. Patient taking M,W,F, Disp: , Rfl:    losartan (COZAAR) 25 MG tablet, Take 12.5 mg by mouth daily., Disp: , Rfl:    metoprolol succinate (TOPROL-XL) 25 MG 24 hr tablet, Take 0.5 tablets by mouth daily., Disp: , Rfl:    oxyCODONE-acetaminophen (PERCOCET) 10-325 MG tablet, Take 1 tablet by mouth every 12 (twelve) hours as needed for pain., Disp: , Rfl:    pantoprazole (PROTONIX) 20 MG tablet, Take 20 mg by mouth daily., Disp: , Rfl:    pregabalin (LYRICA) 150 MG capsule, Take 150 mg by mouth 2 (two) times daily., Disp: , Rfl:    ranolazine (RANEXA) 500  MG 12 hr tablet, Take 500 mg by mouth 2 (two) times daily., Disp: , Rfl:    rosuvastatin (CRESTOR) 5 MG tablet, , Disp: , Rfl:    sertraline (ZOLOFT) 100 MG tablet, Take 100 mg by mouth daily. , Disp: , Rfl:    torsemide (DEMADEX) 20 MG tablet, Take 20 mg by mouth daily as needed., Disp: , Rfl:    TRAZODONE HCL PO, Take 75 mg by mouth at bedtime., Disp: , Rfl:    metoprolol tartrate (LOPRESSOR) 25 MG tablet, Take 12.5 mg by mouth daily. (Patient not taking: Reported on 10/07/2023), Disp: , Rfl:   Social History: Social History   Tobacco Use   Smoking  status: Former    Current packs/day: 0.00    Types: Cigarettes    Quit date: 10/07/2003    Years since quitting: 20.0   Smokeless tobacco: Never  Vaping Use   Vaping status: Never Used  Substance Use Topics   Alcohol use: Not Currently    Alcohol/week: 24.0 standard drinks of alcohol    Types: 24 Glasses of wine per week   Drug use: Not Currently    Family Medical History: Family History  Problem Relation Age of Onset   Heart attack Father     Physical Examination: Vitals:   10/07/23 1317  BP: 120/68    General: Patient is in no apparent distress. Attention to examination is appropriate.  Neck:   Supple.  Full range of motion.  Respiratory: Patient is breathing without any difficulty.   NEUROLOGICAL:     Awake, alert, some short-term attention/distractibility noted speech is clear and fluent.   Cranial Nerves: Pupils equal round and reactive to light.  Facial tone is symmetric.  Facial sensation is symmetric. Shoulder shrug is symmetric. Tongue protrusion is midline.    Strength: Grossly intact bilateral lower extremity strength.  No evidence of major wasting noted.  Reflexes are nonpathologic  Bilateral upper and lower extremity sensation is intact to light touch     No evidence of dysmetria noted.  Gait is normal.    Imaging: Narrative & Impression  CLINICAL DATA:  Mid back pain, rule out thoracic canal stenosis.   EXAM: MRI THORACIC SPINE WITHOUT CONTRAST   TECHNIQUE: Multiplanar, multisequence MR imaging of the thoracic spine was performed. No intravenous contrast was administered.   COMPARISON:  None Available.   FINDINGS: Alignment:  Exaggerated thoracic kyphosis.  No listhesis   Vertebrae: No fracture, evidence of discitis, or aggressive bone lesion. Fatty hemangioma in the T12 right body and pedicle. Chronic superior endplate fractures at T3 and T4. Height loss is minimal.   Cord: Posterior cord flattening at the level of T5 and separately  at T6. Dorsal CSF flow artifact is present, incompatible with arachnoid cyst. No cord herniation is seen. The affected cord does not appear edematous. There is some epidural fat expansion dorsally, but noncompressive.   Paraspinal and other soft tissues: No evidence of inflammation or mass. Atrophy of intrinsic back muscles.   Disc levels:   Diffuse disc desiccation and narrowing. Small central herniations are present at T5-6 to T9-10. Negative facets. No neural impingement.   IMPRESSION: 1. Posterior cord flattening at the level of T5 and separately at T6, most consistent with dorsal arachnoid webs. 2. 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 No compressive degenerative changes.     Electronically Signed   By: Tiburcio Pea M.D.   On: 08/02/2023 08:00    I have personally reviewed the images and  agree with the above interpretation.  Medical Decision Making/Assessment and Plan: Mr. Knoblock is a pleasant 78 y.o. male with failed back syndrome, chronic pain syndrome, spinal stenosis, and a prior lumbar fusion.  He is coming here today for evaluation for a permanent implant of a spinal cord stimulator.  He has had a successful trial with 70% improvement in his symptoms.  Overall he is very optimistic about moving forward with the procedure.  We will plan to obtain the appropriate documentation for permanent trial authorization including his psych evaluation.  We are able to evaluate his recent trial which demonstrated a 70% improvement.  Review of his thoracic MRI demonstrates adequate space for placement of a paddle.  Once we have obtained his documentation we will move forward with scheduling procedure.  Thank you for involving me in the care of this patient.    Lovenia Kim MD/MSCR Neurosurgery

## 2023-10-04 NOTE — H&P (View-Only) (Signed)
 Referring Physician:  [facility] [address] [place],  [place] [address]  Primary Physician:  [facility]  History of Present Illness: 10/07/2023 Danny Marquez is here today with a chief complaint of chronic back pain and bilateral hip pain who has been managed by our pain team.  He recently underwent a trial placement for a spinal cord stimulator and had a 70% improvement in his symptoms.  He is very optimistic about moving forward with a permanent placement.  They state that they have had their psych evaluation and have also had their previous trial done.  The symptoms are causing a significant impact on the patient's life.   I have utilized the care everywhere function in epic to review the outside records available from external health systems.  Review of Systems:  A 10 point review of systems is negative, except for the pertinent positives and negatives detailed in the HPI.  Past Medical History: Past Medical History:  Diagnosis Date   A-fib (HCC)    CHF (congestive heart failure) (HCC)    EF 30%   Chronic kidney disease    congenital one kidney   Hypertension    Myocardial infarction [facility])    Solitary kidney     Past Surgical History: Past Surgical History:  Procedure Laterality Date   BACK SURGERY     CARDIAC DEFIBRILLATOR PLACEMENT     CARDIOVERSION N/A 01/23/2017   Procedure: CARDIOVERSION;  Surgeon: Dalia Heading, MD;  Location: [facility] ORS;  Service: Cardiovascular;  Laterality: N/A;   CARDIOVERSION N/A 02/01/2017   Procedure: CARDIOVERSION;  Surgeon: Dalia Heading, MD;  Location: [facility] ORS;  Service: Cardiovascular;  Laterality: N/A;   COLONOSCOPY WITH PROPOFOL N/A 03/05/2019   Procedure: COLONOSCOPY WITH PROPOFOL;  Surgeon: Christena Deem, MD;  Location: [facility] ENDOSCOPY;  Service: Endoscopy;  Laterality: N/A;   CORONARY ARTERY BYPASS GRAFT     ESOPHAGOGASTRODUODENOSCOPY (EGD) WITH PROPOFOL N/A 03/05/2019   Procedure:  ESOPHAGOGASTRODUODENOSCOPY (EGD) WITH PROPOFOL;  Surgeon: Christena Deem, MD;  Location: [facility] ENDOSCOPY;  Service: Endoscopy;  Laterality: N/A;   JOINT REPLACEMENT     both knees 13 years ago, partial shoulder right    Allergies: Allergies as of 10/07/2023 - Review Complete 10/07/2023  Allergen Reaction Noted   Codeine Nausea Only 04/05/2015    Medications:  Current Outpatient Medications:    allopurinol (ZYLOPRIM) 100 MG tablet, Take 200 mg by mouth daily., Disp: , Rfl:    aspirin EC 81 MG tablet, Take 81 mg by mouth daily. Swallow whole., Disp: , Rfl:    DONEPEZIL HCL PO, Take 10 mg by mouth., Disp: , Rfl:    empagliflozin (JARDIANCE) 25 MG TABS tablet, Take 12.5 mg by mouth daily., Disp: , Rfl:    ferrous gluconate (FERGON) 324 MG tablet, Take 324 mg by mouth daily with breakfast. Patient taking M,W,F, Disp: , Rfl:    losartan (COZAAR) 25 MG tablet, Take 12.5 mg by mouth daily., Disp: , Rfl:    metoprolol succinate (TOPROL-XL) 25 MG 24 hr tablet, Take 0.5 tablets by mouth daily., Disp: , Rfl:    oxyCODONE-acetaminophen (PERCOCET) 10-325 MG tablet, Take 1 tablet by mouth every 12 (twelve) hours as needed for pain., Disp: , Rfl:    pantoprazole (PROTONIX) 20 MG tablet, Take 20 mg by mouth daily., Disp: , Rfl:    pregabalin (LYRICA) 150 MG capsule, Take 150 mg by mouth 2 (two) times daily., Disp: , Rfl:    ranolazine (RANEXA) 500  MG 12 hr tablet, Take 500 mg by mouth 2 (two) times daily., Disp: , Rfl:    rosuvastatin (CRESTOR) 5 MG tablet, , Disp: , Rfl:    sertraline (ZOLOFT) 100 MG tablet, Take 100 mg by mouth daily. , Disp: , Rfl:    torsemide (DEMADEX) 20 MG tablet, Take 20 mg by mouth daily as needed., Disp: , Rfl:    TRAZODONE HCL PO, Take 75 mg by mouth at bedtime., Disp: , Rfl:    metoprolol tartrate (LOPRESSOR) 25 MG tablet, Take 12.5 mg by mouth daily. (Patient not taking: Reported on 10/07/2023), Disp: , Rfl:   Social History: Social History   Tobacco Use   Smoking  status: Former    Current packs/day: 0.00    Types: Cigarettes    Quit date: 10/07/2003    Years since quitting: 20.0   Smokeless tobacco: Never  Vaping Use   Vaping status: Never Used  Substance Use Topics   Alcohol use: Not Currently    Alcohol/week: 24.0 standard drinks of alcohol    Types: 24 Glasses of wine per week   Drug use: Not Currently    Family Medical History: Family History  Problem Relation Age of Onset   Heart attack Father     Physical Examination: Vitals:   10/07/23 1317  BP: 120/68    General: Patient is in no apparent distress. Attention to examination is appropriate.  Neck:   Supple.  Full range of motion.  Respiratory: Patient is breathing without any difficulty.   NEUROLOGICAL:     Awake, alert, some short-term attention/distractibility noted speech is clear and fluent.   Cranial Nerves: Pupils equal round and reactive to light.  Facial tone is symmetric.  Facial sensation is symmetric. Shoulder shrug is symmetric. Tongue protrusion is midline.    Strength: Grossly intact bilateral lower extremity strength.  No evidence of major wasting noted.  Reflexes are nonpathologic  Bilateral upper and lower extremity sensation is intact to light touch     No evidence of dysmetria noted.  Gait is normal.    Imaging: Narrative & Impression  CLINICAL DATA:  Mid back pain, rule out thoracic canal stenosis.   EXAM: MRI THORACIC SPINE WITHOUT CONTRAST   TECHNIQUE: Multiplanar, multisequence MR imaging of the thoracic spine was performed. No intravenous contrast was administered.   COMPARISON:  None Available.   FINDINGS: Alignment:  Exaggerated thoracic kyphosis.  No listhesis   Vertebrae: No fracture, evidence of discitis, or aggressive bone lesion. Fatty hemangioma in the T12 right body and pedicle. Chronic superior endplate fractures at T3 and T4. Height loss is minimal.   Cord: Posterior cord flattening at the level of T5 and separately  at T6. Dorsal CSF flow artifact is present, incompatible with arachnoid cyst. No cord herniation is seen. The affected cord does not appear edematous. There is some epidural fat expansion dorsally, but noncompressive.   Paraspinal and other soft tissues: No evidence of inflammation or mass. Atrophy of intrinsic back muscles.   Disc levels:   Diffuse disc desiccation and narrowing. Small central herniations are present at T5-6 to T9-10. Negative facets. No neural impingement.   IMPRESSION: 1. Posterior cord flattening at the level of T5 and separately at T6, most consistent with dorsal arachnoid webs. 2. 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 No compressive degenerative changes.     Electronically Signed   By: Tiburcio Pea M.D.   On: 08/02/2023 08:00    I have personally reviewed the images and  agree with the above interpretation.  Medical Decision Making/Assessment and Plan: Mr. Knoblock is a pleasant 78 y.o. male with failed back syndrome, chronic pain syndrome, spinal stenosis, and a prior lumbar fusion.  He is coming here today for evaluation for a permanent implant of a spinal cord stimulator.  He has had a successful trial with 70% improvement in his symptoms.  Overall he is very optimistic about moving forward with the procedure.  We will plan to obtain the appropriate documentation for permanent trial authorization including his psych evaluation.  We are able to evaluate his recent trial which demonstrated a 70% improvement.  Review of his thoracic MRI demonstrates adequate space for placement of a paddle.  Once we have obtained his documentation we will move forward with scheduling procedure.  Thank you for involving me in the care of this patient.    Lovenia Kim MD/MSCR Neurosurgery

## 2023-10-07 ENCOUNTER — Other Ambulatory Visit: Payer: Self-pay

## 2023-10-07 ENCOUNTER — Ambulatory Visit (INDEPENDENT_AMBULATORY_CARE_PROVIDER_SITE_OTHER): Payer: Medicare Other | Admitting: Neurosurgery

## 2023-10-07 ENCOUNTER — Encounter: Payer: Self-pay | Admitting: Neurosurgery

## 2023-10-07 VITALS — BP 120/68

## 2023-10-07 DIAGNOSIS — Z981 Arthrodesis status: Secondary | ICD-10-CM | POA: Diagnosis not present

## 2023-10-07 DIAGNOSIS — G8929 Other chronic pain: Secondary | ICD-10-CM

## 2023-10-07 DIAGNOSIS — G894 Chronic pain syndrome: Secondary | ICD-10-CM

## 2023-10-07 DIAGNOSIS — M961 Postlaminectomy syndrome, not elsewhere classified: Secondary | ICD-10-CM | POA: Diagnosis not present

## 2023-10-07 DIAGNOSIS — M48062 Spinal stenosis, lumbar region with neurogenic claudication: Secondary | ICD-10-CM | POA: Diagnosis not present

## 2023-10-07 DIAGNOSIS — Z01818 Encounter for other preprocedural examination: Secondary | ICD-10-CM

## 2023-10-07 NOTE — Patient Instructions (Signed)
  Please see below for information in regards to your upcoming surgery:   Planned surgery: thoracic laminectomy for spinal cord stimulator placement ([employer])   Surgery date: 10/22/23 at [facility] (Medical Mall: [address] [place], [place] [address]) - you will find out your arrival time the business day before your surgery.   Pre-op appointment at [facility] Pre-admit Testing: we will call you with a date/time for this. If you are scheduled for an in person appointment, Pre-admit Testing is located on the first floor of the [facility] building, [address], [address]. Please bring all prescriptions in the original prescription bottles to your appointment. During this appointment, they will advise you which medications you can take the morning of surgery, and which medications you will need to hold for surgery. Labs (such as blood work, EKG) may be done at your pre-op appointment. You are not required to fast for these labs. Should you need to change your pre-op appointment, please call Pre-admit testing at [phone].     Blood thinners:    Aspirin 81mg :  OK to stay on aspirin 81mg    Heart failure medications: Empagliflozin (Jardiance): hold for 3 days prior to surgery    Surgical clearance: we will send a clearance form to Dr Shon Hale at [facility]. They may wish to see you in their office prior to signing the clearance form. If so, they may call you to schedule an appointment.    Common restrictions after surgery: No bending, lifting, or twisting ("BLT"). Avoid lifting objects heavier than 10 pounds for the first 6 weeks after surgery. Where possible, avoid household activities that involve lifting, bending, reaching, pushing, or pulling such as laundry, vacuuming, grocery shopping, and childcare. Try to arrange for help from friends and family for these activities while you heal. Do not drive while taking  prescription pain medication. Weeks 6 through 12 after surgery: avoid lifting more than 25 pounds.     How to contact us:  If you have any questions/concerns before or after surgery, you can reach [place] at [phone], or you can send a mychart message. We can be reached by phone or mychart 8am-4pm, Monday-Friday.  *Please note: Calls after 4pm are forwarded to a third party answering service. Mychart messages are not routinely monitored during evenings, weekends, and holidays. Please call our office to contact the answering service for urgent concerns during non-business hours.    Appointments/FMLA & disability paperwork: Joycelyn Rua, & Flonnie Hailstone Registered Nurse/Surgery scheduler: Royston Cowper Medical Assistants: Nash Mantis Physician Assistants: Joan Flores, PA-C, Manning Charity, PA-C & Drake Leach, PA-C Surgeons: Venetia Night, MD & Ernestine Mcmurray, MD

## 2023-10-10 ENCOUNTER — Telehealth: Payer: Self-pay

## 2023-10-10 DIAGNOSIS — G8929 Other chronic pain: Secondary | ICD-10-CM

## 2023-10-10 DIAGNOSIS — G894 Chronic pain syndrome: Secondary | ICD-10-CM

## 2023-10-10 DIAGNOSIS — M48062 Spinal stenosis, lumbar region with neurogenic claudication: Secondary | ICD-10-CM

## 2023-10-10 DIAGNOSIS — Z981 Arthrodesis status: Secondary | ICD-10-CM

## 2023-10-10 DIAGNOSIS — M961 Postlaminectomy syndrome, not elsewhere classified: Secondary | ICD-10-CM

## 2023-10-10 NOTE — Telephone Encounter (Signed)
-----   Message from Lovenia Kim sent at 10/09/2023  5:55 PM EST ----- Agree, definitely needs psych clearance prior to surgery.  Will not be approved by his insurance company. ----- Message ----- From: Sharlot Gowda, RN Sent: 10/09/2023  10:37 AM EST To: Lovenia Kim, MD; Edward Jolly, MD  It appears he has not been cleared for the SCS. I believe his surgery needs to be canceled. Please see note at the bottom of this psych eval and let me know if you agree. Thank you. ----- Message ----- From: Rockey Situ Sent: 10/09/2023   9:26 AM EST To: Sharlot Gowda, RN  [facility]

## 2023-10-10 NOTE — Telephone Encounter (Signed)
I discussed this with Dr Katrinka Blazing in person. He has not been cleared by psych for the stimulator placement. He can seek a 2nd opinion for a psych evaluation. If he is not cleared, we will need to cancel surgery.  I spoke with the pt's wife. I explained that without psych clearance, we cannot proceed with surgery. She would like to proceed with a 2nd opinion at [facility]. I provided her w/ the # to [facility] and [facility]. She is going to call them now. I have asked her to let us know when the appointment is and the outcome so we can determine if we need to postpone or cancel surgery.

## 2023-10-11 ENCOUNTER — Telehealth: Payer: Self-pay | Admitting: Neurosurgery

## 2023-10-11 NOTE — Telephone Encounter (Signed)
Will need to follow up after 10/24/23 for psych eval note to confirm he was cleared by psych.

## 2023-10-11 NOTE — Telephone Encounter (Signed)
Psych eval is scheduled for 10/24/23. We will reschedule surgery to 10/31/23.

## 2023-10-11 NOTE — Telephone Encounter (Signed)
Surgery has been rescheduled to 10/31/23. I moved the first post op appt accordingly. I left the 2nd post op as previously scheduled b/c it still fits within the 4-6 week window. I have notified the OR, monitoring, and [employer] reps. We will get a new PAT appointment. I left a message on Danny Marquez's personal voicemail.

## 2023-10-11 NOTE — Telephone Encounter (Signed)
Patient's wife is calling to see about moving his surgery to 10/31/2023. She states that he is unable to get the psych evaluation until 10/24/2023.

## 2023-10-16 ENCOUNTER — Inpatient Hospital Stay: Admission: RE | Admit: 2023-10-16 | Payer: No Typology Code available for payment source | Source: Ambulatory Visit

## 2023-10-18 ENCOUNTER — Other Ambulatory Visit: Payer: Self-pay

## 2023-10-18 ENCOUNTER — Encounter
Admission: RE | Admit: 2023-10-18 | Discharge: 2023-10-18 | Disposition: A | Discharge status: Home / Self Care | Payer: No Typology Code available for payment source | Source: Ambulatory Visit | Attending: Neurosurgery | Admitting: Neurosurgery

## 2023-10-18 VITALS — BP 106/70 | HR 78 | Temp 98.1°F | Resp 17 | Ht 69.0 in | Wt 188.0 lb

## 2023-10-18 DIAGNOSIS — Z01812 Encounter for preprocedural laboratory examination: Secondary | ICD-10-CM

## 2023-10-18 DIAGNOSIS — Z01818 Encounter for other preprocedural examination: Secondary | ICD-10-CM | POA: Insufficient documentation

## 2023-10-18 DIAGNOSIS — Z951 Presence of aortocoronary bypass graft: Secondary | ICD-10-CM | POA: Insufficient documentation

## 2023-10-18 DIAGNOSIS — I2510 Atherosclerotic heart disease of native coronary artery without angina pectoris: Secondary | ICD-10-CM | POA: Diagnosis not present

## 2023-10-18 DIAGNOSIS — I447 Left bundle-branch block, unspecified: Secondary | ICD-10-CM | POA: Diagnosis not present

## 2023-10-18 DIAGNOSIS — I5022 Chronic systolic (congestive) heart failure: Secondary | ICD-10-CM | POA: Diagnosis not present

## 2023-10-18 DIAGNOSIS — Z01810 Encounter for preprocedural cardiovascular examination: Secondary | ICD-10-CM | POA: Diagnosis not present

## 2023-10-18 HISTORY — DX: Postlaminectomy syndrome, not elsewhere classified: M96.1

## 2023-10-18 HISTORY — DX: Diverticulosis of intestine, part unspecified, without perforation or abscess without bleeding: K57.90

## 2023-10-18 HISTORY — DX: Acute pancreatitis without necrosis or infection, unspecified: K85.90

## 2023-10-18 HISTORY — DX: Other chronic pain: G89.29

## 2023-10-18 HISTORY — DX: Unspecified atrial fibrillation: I48.91

## 2023-10-18 HISTORY — DX: Hepatic fibrosis, unspecified: K74.00

## 2023-10-18 HISTORY — DX: Gastro-esophageal reflux disease without esophagitis: K21.9

## 2023-10-18 HISTORY — DX: Gout, unspecified: M10.9

## 2023-10-18 HISTORY — DX: Low back pain, unspecified: M54.50

## 2023-10-18 HISTORY — DX: Iron deficiency: E61.1

## 2023-10-18 HISTORY — DX: Spinal stenosis, lumbar region without neurogenic claudication: M48.061

## 2023-10-18 HISTORY — DX: Ischemic cardiomyopathy: I25.5

## 2023-10-18 HISTORY — DX: Hyperlipidemia, unspecified: E78.5

## 2023-10-18 HISTORY — DX: Atherosclerotic heart disease of native coronary artery without angina pectoris: I25.10

## 2023-10-18 HISTORY — DX: Essential (primary) hypertension: I10

## 2023-10-18 HISTORY — DX: Anemia, unspecified: D64.9

## 2023-10-18 LAB — BASIC METABOLIC PANEL
Anion gap: 5 (ref 5–15)
BUN: 18 mg/dL (ref 8–23)
CO2: 27 mmol/L (ref 22–32)
Calcium: 9.6 mg/dL (ref 8.9–10.3)
Chloride: 105 mmol/L (ref 98–111)
Creatinine, Ser: 1.28 mg/dL — ABNORMAL HIGH (ref 0.61–1.24)
GFR, Estimated: 58 mL/min — ABNORMAL LOW (ref >60)
Glucose, Bld: 99 mg/dL (ref 70–99)
Potassium: 4 mmol/L (ref 3.5–5.1)
Sodium: 137 mmol/L (ref 135–145)

## 2023-10-18 LAB — CBC
HCT: 40.4 % (ref 39.0–52.0)
Hemoglobin: 12.8 g/dL — ABNORMAL LOW (ref 13.0–17.0)
MCH: 27.6 pg (ref 26.0–34.0)
MCHC: 31.7 g/dL (ref 30.0–36.0)
MCV: 87.1 fL (ref 80.0–100.0)
Platelets: 158 10*3/uL (ref 150–400)
RBC: 4.64 MIL/uL (ref 4.22–5.81)
RDW: 16.3 % — ABNORMAL HIGH (ref 11.5–15.5)
WBC: 4.7 10*3/uL (ref 4.0–10.5)
nRBC: 0 % (ref 0.0–0.2)

## 2023-10-18 LAB — SURGICAL PCR SCREEN
MRSA, PCR: POSITIVE — AB
Staphylococcus aureus: POSITIVE — AB

## 2023-10-18 NOTE — Patient Instructions (Addendum)
 Your procedure is scheduled on: Thursday, February 20 Report to the Registration Desk on the 1st floor of the [facility] To find out your arrival time, please call [phone] between 1PM - 3PM on: Wednesday, February 19 If your arrival time is 6:00 am, do not arrive before that time as the [facility] entrance doors do not open until 6:00 am.  REMEMBER: Instructions that are not followed completely may result in serious medical risk, up to and including death; or upon the discretion of your surgeon and anesthesiologist your surgery may need to be rescheduled.  Do not eat food after midnight the night before surgery.  No gum chewing or hard candies.  You may however, drink CLEAR liquids up to 2 hours before you are scheduled to arrive for your surgery. Do not drink anything within 2 hours of your scheduled arrival time.  Clear liquids include: - water   - apple juice without pulp - gatorade (not RED colors) - black coffee or tea (Do NOT add milk or creamers to the coffee or tea) Do NOT drink anything that is not on this list.  One week prior to surgery: starting February 13 Stop ANY OVER THE COUNTER supplements until after surgery.  You may however, continue to take Tylenol  if needed for pain up until the day of surgery.  empagliflozin (JARDIANCE) - hold for 3 days before surgery. Last day to take is Sunday, February 16. Resume AFTER surgery.  Per Dr. Claudene orders:  Continue taking the aspirin  81 mg.  Continue taking all of your other prescription medications up until the day of surgery.  ON THE DAY OF SURGERY ONLY TAKE THESE MEDICATIONS WITH SIPS OF WATER :  metoprolol  succinate  pantoprazole  (PROTONIX )  pregabalin (LYRICA)  ranolazine  (RANEXA )  sertraline  (ZOLOFT )   No Alcohol for 24 hours before or after surgery.  No Smoking including e-cigarettes for 24 hours before surgery.  No chewable tobacco products for at least 6 hours before surgery.  No nicotine patches on  the day of surgery.  Do not use any recreational drugs for at least a week (preferably 2 weeks) before your surgery.  Please be advised that the combination of cocaine and anesthesia may have negative outcomes, up to and including death. If you test positive for cocaine, your surgery will be cancelled.  On the morning of surgery brush your teeth with toothpaste and water , you may rinse your mouth with mouthwash if you wish. Do not swallow any toothpaste or mouthwash.  Use CHG Soap as directed on instruction sheet.  Do not wear jewelry, make-up, hairpins, clips or nail polish.  For welded (permanent) jewelry: bracelets, anklets, waist bands, etc.  Please have this removed prior to surgery.  If it is not removed, there is a chance that hospital personnel will need to cut it off on the day of surgery.  Do not wear lotions, powders, or perfumes.   Do not shave body hair from the neck down 48 hours before surgery.  Contact lenses, hearing aids and dentures may not be worn into surgery.  Do not bring valuables to the hospital. [facility] is not responsible for any missing/lost belongings or valuables.   Notify your doctor if there is any change in your medical condition (cold, fever, infection).  Wear comfortable clothing (specific to your surgery type) to the hospital.  After surgery, you can help prevent lung complications by doing breathing exercises.  Take deep breaths and cough every 1-2 hours. Your doctor may order a  device called an Incentive Spirometer to help you take deep breaths.  If you are being admitted to the hospital overnight, leave your suitcase in the car. After surgery it may be brought to your room.  In case of increased patient census, it may be necessary for you, the patient, to continue your postoperative care in the Same Day Surgery department.  If you are being discharged the day of surgery, you will not be allowed to drive home. You will need a responsible  individual to drive you home and stay with you for 24 hours after surgery.   If you are taking public transportation, you will need to have a responsible individual with you.  Please call the Pre-admissions Testing Dept. at [phone] if you have any questions about these instructions.  Surgery Visitation Policy:  Patients having surgery or a procedure may have two visitors.  Children under the age of 66 must have an adult with them who is not the patient.  Temporary Visitor Restrictions Due to increasing cases of flu, RSV and COVID-19: Children ages 17 and under will not be able to visit patients in [facility] hospitals under most circumstances.  Inpatient Visitation:    Visiting hours are 7 a.m. to 8 p.m. Up to four visitors are allowed at one time in a patient room. The visitors may rotate out with other people during the day.  One visitor age 91 or older may stay with the patient overnight and must be in the room by 8 p.m.    Pre-operative 5 CHG Bath Instructions   You can play a key role in reducing the risk of infection after surgery. Your skin needs to be as free of germs as possible. You can reduce the number of germs on your skin by washing with CHG (chlorhexidine  gluconate) soap before surgery. CHG is an antiseptic soap that kills germs and continues to kill germs even after washing.   DO NOT use if you have an allergy to chlorhexidine /CHG or antibacterial soaps. If your skin becomes reddened or irritated, stop using the CHG and notify one of our RNs at [phone].   Please shower with the CHG soap starting 4 days before surgery using the following schedule:     Please keep in mind the following:  DO NOT shave, including legs and underarms, starting the day of your first shower.   You may shave your face at any point before/day of surgery.  Place clean sheets on your bed the day you start using CHG soap. Use a clean washcloth (not used since being washed) for  each shower. DO NOT sleep with pets once you start using the CHG.   CHG Shower Instructions:  If you choose to wash your hair and private area, wash first with your normal shampoo/soap.  After you use shampoo/soap, rinse your hair and body thoroughly to remove shampoo/soap residue.  Turn the water  OFF and apply about 3 tablespoons (45 ml) of CHG soap to a CLEAN washcloth.  Apply CHG soap ONLY FROM YOUR NECK DOWN TO YOUR TOES (washing for 3-5 minutes)  DO NOT use CHG soap on face, private areas, open wounds, or sores.  Pay special attention to the area where your surgery is being performed.  If you are having back surgery, having someone wash your back for you may be helpful. Wait 2 minutes after CHG soap is applied, then you may rinse off the CHG soap.  Pat dry with a clean towel  Put on  clean clothes/pajamas   If you choose to wear lotion, please use ONLY the CHG-compatible lotions on the back of this paper.     Additional instructions for the day of surgery: DO NOT APPLY any lotions, deodorants, cologne, or perfumes.   Put on clean/comfortable clothes.  Brush your teeth.  Ask your nurse before applying any prescription medications to the skin.      CHG Compatible Lotions   Aveeno Moisturizing lotion  Cetaphil Moisturizing Cream  Cetaphil Moisturizing Lotion  Clairol Herbal Essence Moisturizing Lotion, Dry Skin  Clairol Herbal Essence Moisturizing Lotion, Extra Dry Skin  Clairol Herbal Essence Moisturizing Lotion, Normal Skin  Curel Age Defying Therapeutic Moisturizing Lotion with Alpha Hydroxy  Curel Extreme Care Body Lotion  Curel Soothing Hands Moisturizing Hand Lotion  Curel Therapeutic Moisturizing Cream, Fragrance-Free  Curel Therapeutic Moisturizing Lotion, Fragrance-Free  Curel Therapeutic Moisturizing Lotion, Original Formula  Eucerin Daily Replenishing Lotion  Eucerin Dry Skin Therapy Plus Alpha Hydroxy Crme  Eucerin Dry Skin Therapy Plus Alpha Hydroxy Lotion   Eucerin Original Crme  Eucerin Original Lotion  Eucerin Plus Crme Eucerin Plus Lotion  Eucerin TriLipid Replenishing Lotion  Keri Anti-Bacterial Hand Lotion  Keri Deep Conditioning Original Lotion Dry Skin Formula Softly Scented  Keri Deep Conditioning Original Lotion, Fragrance Free Sensitive Skin Formula  Keri Lotion Fast Absorbing Fragrance Free Sensitive Skin Formula  Keri Lotion Fast Absorbing Softly Scented Dry Skin Formula  Keri Original Lotion  Keri Skin Renewal Lotion Keri Silky Smooth Lotion  Keri Silky Smooth Sensitive Skin Lotion  Nivea Body Creamy Conditioning Oil  Nivea Body Extra Enriched Teacher, Adult Education Moisturizing Lotion Nivea Crme  Nivea Skin Firming Lotion  NutraDerm 30 Skin Lotion  NutraDerm Skin Lotion  NutraDerm Therapeutic Skin Cream  NutraDerm Therapeutic Skin Lotion  ProShield Protective Hand Cream  Provon moisturizing lotion

## 2023-10-19 ENCOUNTER — Encounter: Payer: Self-pay | Admitting: Neurosurgery

## 2023-10-19 NOTE — Progress Notes (Signed)
  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 He is scheduled to receive CEFAZOLIN  + VANCOMYCIN  pre-operatively. Pre-surgical PCR (+) for MRSA; see above.  PLANS:  Review renal function. Estimated Creatinine Clearance: 52.3 mL/min (A) (by C-G formula based on SCr of 1.28 mg/dL (H)).  Verified that orders for vancomycin  are in place to cover (+) MRSA result.  Patient with orders for both CEFAZOLIN  + VANCOMYCIN  to be given in the setting of documented MRSA (+) surgical PCR.   Guidelines suggest that a beta-lactam antibiotic (first or second generation cephalosporin) should be added for activity against gram-negative organisms.  Vancomycin  appears to be less effective than cefazolin  for preventing SSIs caused by MSSA. For this reason, the use of vancomycin  in combination with cefazolin  is favored for prevention of SSI due to MRSA and coagulase-negative staphylococci.  Medical history in [facility] updated to reflect (+) PCR result indicating nasal MRSA colonization   Dorise Pereyra, MSN, APRN, FNP-C, CEN [facility]  Perioperative Services Nurse Practitioner Phone: [phone] 10/19/23 2:29 PM

## 2023-10-25 NOTE — Telephone Encounter (Signed)
Pt has been cleared by psych and is OK to proceed with surgery

## 2023-10-29 ENCOUNTER — Telehealth: Payer: Self-pay

## 2023-10-29 NOTE — Telephone Encounter (Signed)
I spoke with Danny and Danny Marquez and informed them that surgery will remain on schedule as planned for Thursday and we do not plan on canceling due to weather unless they would like to do so. They would like to keep surgery as scheduled.

## 2023-10-30 ENCOUNTER — Ambulatory Visit: Payer: No Typology Code available for payment source | Admitting: Neurosurgery

## 2023-10-30 ENCOUNTER — Encounter: Payer: Self-pay | Admitting: Urgent Care

## 2023-10-30 DIAGNOSIS — I700 Atherosclerosis of aorta: Secondary | ICD-10-CM

## 2023-10-30 NOTE — Progress Notes (Signed)
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 Clinical nursing staff has updated patient's PMH/PSHx, current medication list, and drug allergies/intolerances to ensure comprehensive history available to assist in medical decision making as it pertains to the aforementioned surgical procedure and anticipated anesthetic course. Extensive review of available clinical information personally performed. [place] [facility] and PSHx updated with any diagnoses/procedures that  may have been inadvertently omitted during his intake with the pre-admission testing department's nursing staff.  Clinical Discussion:  Danny Marquez is a 78 y.o. male who is submitted for pre-surgical anesthesia review and clearance prior to him undergoing the above procedure. \Patient is a Former Smoker (quit 09/2003). Pertinent PMH includes: CAD (s/p CABG), MI, ischemic cardiomyopathy (s/p CRT-D placement), HFrEF, atrial fibrillation (s/p LAA closure), iatrogenic ASD, NSVT (s/p ablation), LBBB, BILATERAL carotid artery disease, pulmonary hypertension, palpitations (PVCs), aortic atherosclerosis, HTN,  HLD, congenital solitary kidney, GERD (on daily PPI), anemia, hepatic fibrosis/cirrhosis, ambulation difficulties, chronic lower back pain, chronic pain syndrome, tremor, MDD, PTSD, remote alcohol dependence, mild dementia, BILATERAL SNHL, insomnia.   Patient is followed by cardiology Darin Engels, MD). He was last seen in the cardiology clinic on 08/21/2023; notes reviewed. At the time of his clinic visit, patient doing well overall from a cardiovascular perspective. Patient denied any chest pain, shortness of breath, PND, orthopnea, palpitations, significant peripheral edema, weakness, fatigue, vertiginous symptoms, or presyncope/syncope. Patient with a past medical history significant for cardiovascular diagnoses. Documented physical exam was grossly benign, providing no evidence of acute exacerbation and/or decompensation of the patient's known cardiovascular conditions.  Patient reported to have suffered an MI (details unknown) back in 2008.  Patient ultimately underwent a two-vessel revascularization procedure on 09/13/2006, at which time LIMA-LAD and SVG-OM1 bypass grafts were placed.  Following his MI, patient with an ischemic cardiomyopathy and resulting HFrEF.  Cardiac function has been monitored since time of diagnosis.  EF as low as 30%.  Patient ultimately underwent placement of a Medtronic CRT-D device on 07/23/2021.  Device is regularly interrogated by patient's primary electrophysiology team.  Last interrogation indicated normal functioning of the patient's implanted device.  Long-term cardiac event monitor study was performed on 12/25/2016 revealing an underlying atrial fibrillation diagnosis with variable ventricular response and an average rate of 86 bpm; range 49-141 bpm.  There were 2461 single PVCs and 43 PVC couplets noted during the study.  There were no sustained arrhythmias or prolonged pauses.  Patient underwent DCCV procedure on 01/23/2017 at which time he received a single 150 J  synchronized cardioversion restoring NSR.   Despite DCCV, patient slower went back into his atrial arhythmia. Procedure was repeated  on 02/01/2017.   Patient underwent PVI and roof line ablation in attempts to treat his atrial arrhythmia (atrial fibrillation) on 01/12/2022.  Due to refractory atrial arrhythmia, patient ultimately underwent LAA closure on 03/19/2022.  24 mm Watchman FLX device was placed.  Patient underwent diagnostic LEFT heart catheterization on 01/23/2022 revealing multivessel CAD; 60% proximal LAD, 100% mid LAD, 100%  D1, 100% proximal LCx, 100% mid LCx, 100% OM1, 70% OM2, 80% OM3, 40% proximal RCA, 99% mid RCA, and 100% distal RCA.  LIMA-LAD and SVG-OM1 bypass grafts were patent.  The decision was made to defer further intervention at that time opting for medical management.  Follow-up transesophageal echocardiogram performed on 04/30/2022 revealed a normal left ventricular systolic function with an EF of >55%.  There were no significant regional wall motion abnormalities.  Diastolic Doppler parameters were normal.  There was a trivial iatrogenic ASD noted.  Trivial tricuspid and moderate mitral valve regurgitation was observed.  All transvalvular gradients remained normal, thus suggesting no valvular stenosis.  Following CRT-D device placement, device interrogations indicated multiple episodes of NSVT that occurred below the set threshold of the device.  Attempts made at performing a ventricular tachycardia ablation on 05/24/2022.  During the procedure, significant epicardial, mid-myocardial portions of circuit noted and procedure was therefore prolonged.  Ultimately, procedure was aborted before left ventricular substrate was fully ablated due to worsening left ventricular function and increased hyperdynamic support needs following 5 administered shocks.  Plans were for a staged procedure at a later time.  Patient did undergo staged ventricular tachycardia ablation on 05/31/2022,  which was successful.  Again, patient has an atrial fibrillation diagnosis; CHA2DS2-VASc Score = 5 (age x 2, HFrEF, HTN, prior MI/vascular disease). His rate and rhythm are currently being maintained on oral metoprolol succinate.  Patient is not on chronic oral anticoagulation therapy.  Blood pressure well controlled at 114/62 mmHg on currently prescribed ARB (losartan), beta-blocker (metoprolol succinate), and diuretic (torsemide) therapies.  Patient is on ranolazine for prevention of anginal symptoms; reported effective. In the setting of known cardiovascular diagnoses, patient is taking an SGLT2i (empagliflozin) for added cardiovascular and renovascular protection.  He is on rosuvastatin for his HLD diagnosis and further ASCVD prevention. Patient is not diabetic. He does not have an OSAH diagnosis. Patient is able to complete all of his ADL/IADLs without cardiovascular limitation.  Per the DASI, patient is able to achieve at least 4 METS of physical activity without experiencing any significant degree of angina/anginal equivalent symptoms. No changes were made to his medication regimen during his visit with cardiology.  Patient scheduled to follow-up with outpatient cardiology in 6 months or sooner if needed.  Danny Marquez is scheduled for an elective THORACIC LAMINECTOMY FOR SPINAL CORD STIMULATOR PLACEMENT (MEDTRONIC) on 10/31/2023 with Dr. Ernestine Mcmurray, MD.  Given patient's past medical history significant for cardiovascular diagnoses, presurgical cardiac clearance was sought by the PAT team. Per cardiology, "this patient is optimized for surgery and may proceed with the planned procedural course with a MODERATE risk of significant perioperative cardiovascular complications".  In review of the patient's chart, it is noted that he is on daily oral antithrombotic therapy.  Given patient's history of significant cardiovascular disease, neurosurgery has cleared patient to continue his daily low-dose  ASA throughout his perioperative course.  Patient has been updated on these directives by the PAT team.  Patient denies previous perioperative complications with anesthesia in the past. In review his EMR, it is noted that patient underwent a general anesthetic course  at [facility] (ASA IV) in 05/2022 without documented complications.      10/18/2023    2:51 PM 10/07/2023    1:17 PM 09/25/2023   11:54 AM  Vitals with BMI  Height 5\' 9"     Weight 188 lbs    BMI 27.75    Systolic 106 120 829  Diastolic 70 68 67  Pulse 78     Providers/Specialists:  NOTE: Primary physician provider listed below. Patient may have been seen by APP or partner within same practice.   PROVIDER ROLE / SPECIALTY LAST OV  Lovenia Kim, MD Neurosurgery (Surgeon) 10/07/2023  [facility] Provider 06/18/2023  Shon Hale, MD Cardiology 08/21/2023  Edward Jolly, MD Physiatry 09/25/2023   Allergies:   Allergies  Allergen Reactions   Codeine Nausea Only    dizzy   Current Home Medications:   No current facility-administered medications for this encounter.    allopurinol (ZYLOPRIM) 100 MG tablet   aspirin EC 81 MG tablet   donepezil (ARICEPT) 10 MG tablet   empagliflozin (JARDIANCE) 25 MG TABS tablet   ferrous gluconate (FERGON) 324 MG tablet   losartan (COZAAR) 25 MG tablet   metoprolol succinate (TOPROL-XL) 25 MG 24 hr tablet   oxyCODONE-acetaminophen (PERCOCET) 10-325 MG tablet   pantoprazole (PROTONIX) 20 MG tablet   pregabalin (LYRICA) 150 MG capsule   ranolazine (RANEXA) 500 MG 12 hr tablet   rosuvastatin (CRESTOR) 5 MG tablet   sertraline (ZOLOFT) 100 MG tablet   torsemide (DEMADEX) 20 MG tablet   traZODone (DESYREL) 50 MG tablet   History:   Past Medical History:  Diagnosis Date   Anemia    Aortic atherosclerosis (HCC)    ASD (atrial septal defect) 04/30/2022   a.) TTE 04/30/2022: trivial iatrogenic ASD   Atrial fibrillation (HCC)    a.)  CHA2DS2VASc = 5 (age x2, HFrEF, HTN, prior MI/vascular disease) as of 10/30/2023; b.) s/p DCCV 01/23/2017 (150 x 1); c.) s/p DCCV 02/01/2017; d.) s/p PVI + roof line ablation 01/12/2022; e.) s/p LAA closure (Watchman) 03/19/2022; f.) rate/rhythm maintained on oral metoprolol succinate; not on chronic OAC   B12 deficiency    Benign essential hypertension    Bilateral carotid artery disease (HCC)    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   a.) Medtronic device CRT-D device placed 07/23/2021   CHF (congestive heart failure) (HCC)    a.) MV 04/05/2015: EF 30-35%; b.) TTE 01/08/2017: EF 30%, glob HK, mod LVE, mod BAE, RVSP 50.5, mild PR, mod MR/TR; c.) TTE 09/22/2020: EF 30%, glob HK, mod BAE/BVE, RVSP 48.5, mild PR, mod MR/TR; d.) TTE 04/30/2022: EF >55%, triv iatrogenic ASD, mod MR, triv TR   Chronic low back pain    Chronic pain syndrome    Chronic passive hepatic congestion    Cirrhosis (HCC)    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)    Coronary artery disease    a.) MI (unk type) 09/2006; b.) s/p 2v CABG 09/13/2006; c.) LHC 01/23/2022: 60% pLAD, 100% mLAD, 100% D1, 100% pLCx, 100% mLCx, 100% OM1, 70% OM2, 80% OM3, 40% pRCA, 99% mRCA, 100% dRCAl LIMA-LAD and SVG-OM1 bypass grafts were patent --> med mgmt; d.) MV 04/05/2015: no ischemia;   Depression    Difficulty walking    Diverticulosis    Failed back surgical syndrome    GERD (gastroesophageal reflux disease)    Gout    Hepatic fibrosis    History of alcohol dependence (HCC)  History of left atrial appendage closure 03/19/2022   a.) 24 mm Watchman FLX device placed 03/19/2022   HTN (hypertension)    Hyperlipidemia    Insomnia    a.) on trazodone PRN   Iron deficiency    Ischemic cardiomyopathy    a.) MV 04/05/2015: EF 30-35%; b.) TTE 01/08/2017: EF 30%; c.) TTE 09/22/2020: EF 30%;; d.) TTE 04/30/2022: EF >55%   LBBB (left bundle branch block)    Mild dementia (HCC)    a.) on  acetylcholinesterase inhibitor (donepazil)   MRSA nasal colonization 10/18/2023   a.) presurgical PCR (+) 10/18/2023 prior to THORACIC LAMINECTOMY FOR SPINAL CORD STIMULATOR PLACEMENT   Myocardial infarction [facility]) 09/2006   Neuropathy    NSVT (nonsustained ventricular tachycardia) (HCC)    a.) multiple episodes following CRT-D placement (below device threshold); b.) s/p VT ablation 05/24/2022 -->  significant epicardial/mid-myocardial portions of circuit noted and procedure was prolonged -> aborted before LV substrate was fully ablated d/t worsening LV function and increased hemodynamic support  needs following 5 administered shocks; c.) staged VT ablation 05/31/2022 - successful   Pancreatitis    PTSD (post-traumatic stress disorder)    Pulmonary hypertension (HCC)    a.) TTE 01/08/2017: RVSP 50.5; b.) TTE 09/22/2020: RVSP 48.5   PVC (premature ventricular contraction)    a.) holter 12/25/2016: underlying A.fib with variable ventricular response at average rate of 86 bpm (range 49-141 bpm); 2461 single PVCs and 43 PVC couplets   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   a.) LIMA-LAD, SVG-OM1   SNHL (sensorineural hearing loss)    Spinal stenosis of lumbar region    Tremor    Past Surgical History:  Procedure Laterality Date   BIV MEDTRONIC ICD INSERTION CRT-D N/A 07/23/2021   CARDIOVERSION N/A 01/23/2017   Procedure: CARDIOVERSION;  Surgeon: Dalia Heading, MD;  Location: [facility] ORS;  Service: Cardiovascular;  Laterality: N/A;   CARDIOVERSION N/A 02/01/2017   Procedure: CARDIOVERSION;  Surgeon: Dalia Heading, MD;  Location: [facility] ORS;  Service: Cardiovascular;  Laterality: N/A;   CHOLECYSTECTOMY     COLONOSCOPY WITH PROPOFOL N/A 03/05/2019   Procedure: COLONOSCOPY WITH PROPOFOL;  Surgeon: Christena Deem, MD;  Location: [facility] ENDOSCOPY;  Service: Endoscopy;  Laterality: N/A;   CORONARY ARTERY BYPASS GRAFT N/A 09/13/2006   ESOPHAGOGASTRODUODENOSCOPY (EGD) WITH PROPOFOL N/A 03/05/2019   Procedure:  ESOPHAGOGASTRODUODENOSCOPY (EGD) WITH PROPOFOL;  Surgeon: Christena Deem, MD;  Location: [facility] ENDOSCOPY;  Service: Endoscopy;  Laterality: N/A;   LEFT HEART CATH AND CORONARY ANGIOGRAPHY Left 09/2006   LEFT HEART CATH AND CORS/GRAFTS ANGIOGRAPHY Left 01/23/2022   LUMBAR SPINE SURGERY     PIN AND CAGE PLACED   SACROILIAC JOINT FUSION Right    SHOULDER ARTHROSCOPY Right    TOTAL KNEE ARTHROPLASTY Bilateral    V TACH ABLATION N/A 05/24/2022   aborted due to decreased LV function and hemodynamic instability; staged procedure planned   V TACH ABLATION (successful staged procedure) N/A 05/31/2022   WATCHMAN IMPLANT N/A 03/19/2022   Family History  Problem Relation Age of Onset   Heart attack Father    Social History   Tobacco Use   Smoking status: Former    Current packs/day: 0.00    Types: Cigarettes    Quit date: 10/07/2003    Years since quitting: 20.0   Smokeless tobacco: Never  Substance Use Topics   Alcohol use: Not Currently    Comment: NONE SINCE 2018   Pertinent Clinical Results:  LABS:  Hospital Outpatient Visit on  10/18/2023  Component Date Value Ref Range Status   MRSA, PCR 10/18/2023 POSITIVE (A)  NEGATIVE Final   Comment: CRITICAL RESULT CALLED TO, READ BACK BY AND VERIFIED WITH: Carlis Stable NP @ 1734 10/18/23 [facility]    Staphylococcus aureus 10/18/2023 POSITIVE (A)  NEGATIVE Final   Comment: (NOTE) The Xpert SA Assay (FDA approved for NASAL specimens in patients 15 years of age and older), is one component of a comprehensive surveillance program. It is not intended to diagnose infection nor to guide or monitor treatment. Performed at [facility], [address] [place], [place] [address]    Sodium 10/18/2023 137  135 - 145 mmol/L Final   Potassium 10/18/2023 4.0  3.5 - 5.1 mmol/L Final   Chloride 10/18/2023 105  98 - 111 mmol/L Final   CO2 10/18/2023 27  22 - 32 mmol/L Final   Glucose, Bld 10/18/2023 99  70 - 99 mg/dL Final   Glucose reference  range applies only to samples taken after fasting for at least 8 hours.   BUN 10/18/2023 18  8 - 23 mg/dL Final   Creatinine, Ser 10/18/2023 1.28 (H)  0.61 - 1.24 mg/dL Final   Calcium 60/45/4098 9.6  8.9 - 10.3 mg/dL Final   GFR, Estimated 10/18/2023 58 (L)  >60 mL/min Final   Comment: (NOTE) Calculated using the CKD-EPI Creatinine Equation (2021)    Anion gap 10/18/2023 5  5 - 15 Final   Performed at [facility], [address] [place], [place] [address]   WBC 10/18/2023 4.7  4.0 - 10.5 K/uL Final   RBC 10/18/2023 4.64  4.22 - 5.81 MIL/uL Final   Hemoglobin 10/18/2023 12.8 (L)  13.0 - 17.0 g/dL Final   HCT 78/29/5621 40.4  39.0 - 52.0 % Final   MCV 10/18/2023 87.1  80.0 - 100.0 fL Final   MCH 10/18/2023 27.6  26.0 - 34.0 pg Final   MCHC 10/18/2023 31.7  30.0 - 36.0 g/dL Final   RDW 30/86/5784 16.3 (H)  11.5 - 15.5 % Final   Platelets 10/18/2023 158  150 - 400 K/uL Final   nRBC 10/18/2023 0.0  0.0 - 0.2 % Final   Performed at [facility], [address] [place], [place] [address]    ECG: Date: 10/18/2023 Time ECG obtained: 1443 PM Rate: 71 bpm Rhythm:  Atrial paced rhythm with prolonged AV conduction and occasional ventricular paced complexes; LBBB Axis (leads I and aVF): normal Intervals: PR 318 ms. QRS 146 ms. QTc 434 ms. ST segment and T wave changes: No evidence of acute T wave abnormalities or significant ST segment elevation or depression.  Evidence of a possible, age undetermined, prior infarct:  No Comparison: Similar to previous tracing obtained on 06/01/2022   IMAGING / PROCEDURES: MR THORACIC SPINE WO CONTRAST performed on 07/09/2023 Posterior cord flattening at the level of T5 and separately at T6, most consistent with dorsal arachnoid webs. 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  No compressive degenerative changes.  MR LUMBAR SPINE WO CONTRAST performed on 11/13/2022 Status post interval fusion and  decompression L4-S1, with resolution of the previously noted spinal canal stenosis at L4-L5 and L5-S1. Mild right neural foraminal narrowing at L5-S1 appears improved compared to 2016 MRI. L3-L4 mild spinal canal stenosis with mild right and moderate left neural foraminal narrowing, which have progressed from the prior exam. Narrowing of the lateral recesses at this level could affect the descending L4 nerve roots.  L2-L3 mild left neural foraminal narrowing,  which is new from the prior exam. Narrowing of the lateral recesses at this level could affect the descending L3 nerve roots.  TRANSESOPHAGEAL ECHOCARDIOGRAM performed on 04/30/2022 Well seated Watchman device with no peri-device leak  Iatrogenic ASD, trivial  Moderate MR  Mild TR  Normal LV function; LVEF ?55%  MYOCARDIAL PERFUSION IMAGING STUDY (LEXISCAN) performed on 04/05/2015 Nuclear stress EF: 35%. The left ventricular ejection fraction is moderately decreased (30-44%). Blood pressure demonstrated a blunted response to exercise. There was no ST segment deviation noted during stress. No T wave inversion was noted during stress. Moderate to severely depressed left ventricular function ejection fraction of 30-35% Hypokinesis of the septal inferior apical segment No clear evidence of ischemia This is an intermediate risk study.  LONG TERM CARDIAC EVENT MONITOR STUDY performed on 12/25/2016 Hours recorded: 48 hours  Total Beats: 241,459 beats  Heart Rate:  Average: 86 bpm, Minimum: 49 bpm 1:57 PM on day 1 , Maximum: 141 bpm at 8:12 AM on day 1  Pauses > 2 seconds: 0  Longest RR interval: 2.0 sec at 11:39 AM on day 1  Ventricular Ectopy:   Total: 2547     Isolated: 2461 Couplets: 43 Runs: 0    Atrial fibrillation with variable ventricular response.  No prolonged pauses.  No significant ventricular arrhythmias.  There were ventricular couplets but no runs.   Impression and Plan:  Danny Marquez has been referred for  pre-anesthesia review and clearance prior to him undergoing the planned anesthetic and procedural courses. Available labs, pertinent testing, and imaging results were personally reviewed by me in preparation for upcoming operative/procedural course. [facility] medical record has been updated following extensive record review and patient interview with PAT staff.   This patient has been appropriately cleared by cardiology with an overall MODERATE risk of experiencing significant perioperative cardiovascular complications.  Attempted to obtain completed perioperative prescription for cardiac device management documentation from primary cardiology team, however unsuccessful.  [facility] cardiologist advising that device is managed at [facility].  Unable to speak with a live person at the [place]; left messages with no return responses. Patient has an AICD placed.  Given the nature of his procedure, device may be questionably affected.  Standard magnet application would be prudent for planned surgical procedure to disable defibrillator.  Device will revert to previously programmed settings once magnet is removed.  Beyond normal perioperative cardiovascular monitoring, and the aforementioned magnet recommendation, there are no recommendations that would prompt further discussion/recommendations from industry representative.   Based on clinical review performed today (10/30/23), barring any significant acute changes in the patient's overall condition, it is anticipated that he will be able to proceed with the planned surgical intervention. Any acute changes in clinical condition may necessitate his procedure being postponed and/or cancelled. Patient will meet with anesthesia team (MD and/or CRNA) on the day of his procedure for preoperative evaluation/assessment. Questions regarding anesthetic course will be fielded at that time.   Pre-surgical instructions were reviewed with the patient during his PAT appointment, and questions were  fielded to satisfaction by PAT clinical staff. He has been instructed on which medications that he will need to hold prior to surgery, as well as the ones that have been deemed safe/appropriate to take on the day of his procedure. As part of the general education provided by PAT, patient made aware both verbally and in writing, that he would need to abstain from the use of any illegal substances during his perioperative course. He was advised that  failure to follow the provided instructions could necessitate case cancellation or result in serious perioperative complications up to and including death. Patient encouraged to contact PAT and/or his surgeon's office to discuss any questions or concerns that may arise prior to surgery; verbalized understanding.   Quentin Mulling, MSN, APRN, FNP-C, CEN [facility]  Perioperative Services Nurse Practitioner Phone: [phone] Fax: [phone] 10/30/23 5:12 PM  NOTE: This note has been prepared using Dragon dictation software. Despite my best ability to proofread, there is always the potential that unintentional transcriptional errors may still occur from this process.

## 2023-10-31 ENCOUNTER — Encounter
Admission: RE | Disposition: A | Discharge status: Home / Self Care | Payer: Self-pay | Source: Home / Self Care | Attending: Neurosurgery

## 2023-10-31 ENCOUNTER — Other Ambulatory Visit: Payer: Self-pay

## 2023-10-31 ENCOUNTER — Ambulatory Visit: Payer: Non-veteran care

## 2023-10-31 ENCOUNTER — Encounter: Payer: Self-pay | Admitting: Neurosurgery

## 2023-10-31 ENCOUNTER — Ambulatory Visit: Payer: No Typology Code available for payment source | Admitting: Urgent Care

## 2023-10-31 ENCOUNTER — Ambulatory Visit
Admission: RE | Admit: 2023-10-31 | Discharge: 2023-10-31 | Disposition: A | Discharge status: Home / Self Care | Payer: Non-veteran care | Attending: Neurosurgery | Admitting: Neurosurgery

## 2023-10-31 DIAGNOSIS — I272 Pulmonary hypertension, unspecified: Secondary | ICD-10-CM | POA: Insufficient documentation

## 2023-10-31 DIAGNOSIS — F03A3 Unspecified dementia, mild, with mood disturbance: Secondary | ICD-10-CM | POA: Insufficient documentation

## 2023-10-31 DIAGNOSIS — E785 Hyperlipidemia, unspecified: Secondary | ICD-10-CM | POA: Insufficient documentation

## 2023-10-31 DIAGNOSIS — R251 Tremor, unspecified: Secondary | ICD-10-CM | POA: Diagnosis not present

## 2023-10-31 DIAGNOSIS — M549 Dorsalgia, unspecified: Secondary | ICD-10-CM | POA: Diagnosis present

## 2023-10-31 DIAGNOSIS — D649 Anemia, unspecified: Secondary | ICD-10-CM | POA: Insufficient documentation

## 2023-10-31 DIAGNOSIS — G894 Chronic pain syndrome: Secondary | ICD-10-CM | POA: Diagnosis not present

## 2023-10-31 DIAGNOSIS — F4310 Post-traumatic stress disorder, unspecified: Secondary | ICD-10-CM | POA: Insufficient documentation

## 2023-10-31 DIAGNOSIS — G4700 Insomnia, unspecified: Secondary | ICD-10-CM | POA: Insufficient documentation

## 2023-10-31 DIAGNOSIS — I493 Ventricular premature depolarization: Secondary | ICD-10-CM | POA: Insufficient documentation

## 2023-10-31 DIAGNOSIS — H903 Sensorineural hearing loss, bilateral: Secondary | ICD-10-CM | POA: Insufficient documentation

## 2023-10-31 DIAGNOSIS — I255 Ischemic cardiomyopathy: Secondary | ICD-10-CM | POA: Diagnosis not present

## 2023-10-31 DIAGNOSIS — M5441 Lumbago with sciatica, right side: Secondary | ICD-10-CM | POA: Insufficient documentation

## 2023-10-31 DIAGNOSIS — Z7982 Long term (current) use of aspirin: Secondary | ICD-10-CM | POA: Insufficient documentation

## 2023-10-31 DIAGNOSIS — I110 Hypertensive heart disease with heart failure: Secondary | ICD-10-CM | POA: Insufficient documentation

## 2023-10-31 DIAGNOSIS — Z981 Arthrodesis status: Secondary | ICD-10-CM | POA: Insufficient documentation

## 2023-10-31 DIAGNOSIS — Z79899 Other long term (current) drug therapy: Secondary | ICD-10-CM | POA: Insufficient documentation

## 2023-10-31 DIAGNOSIS — Z22322 Carrier or suspected carrier of Methicillin resistant Staphylococcus aureus: Secondary | ICD-10-CM | POA: Insufficient documentation

## 2023-10-31 DIAGNOSIS — K219 Gastro-esophageal reflux disease without esophagitis: Secondary | ICD-10-CM | POA: Insufficient documentation

## 2023-10-31 DIAGNOSIS — M48062 Spinal stenosis, lumbar region with neurogenic claudication: Secondary | ICD-10-CM | POA: Diagnosis not present

## 2023-10-31 DIAGNOSIS — K7460 Unspecified cirrhosis of liver: Secondary | ICD-10-CM | POA: Insufficient documentation

## 2023-10-31 DIAGNOSIS — M961 Postlaminectomy syndrome, not elsewhere classified: Secondary | ICD-10-CM | POA: Insufficient documentation

## 2023-10-31 DIAGNOSIS — I2510 Atherosclerotic heart disease of native coronary artery without angina pectoris: Secondary | ICD-10-CM | POA: Insufficient documentation

## 2023-10-31 DIAGNOSIS — I471 Supraventricular tachycardia, unspecified: Secondary | ICD-10-CM | POA: Insufficient documentation

## 2023-10-31 DIAGNOSIS — I700 Atherosclerosis of aorta: Secondary | ICD-10-CM | POA: Diagnosis not present

## 2023-10-31 DIAGNOSIS — I5022 Chronic systolic (congestive) heart failure: Secondary | ICD-10-CM | POA: Insufficient documentation

## 2023-10-31 DIAGNOSIS — Z87891 Personal history of nicotine dependence: Secondary | ICD-10-CM | POA: Insufficient documentation

## 2023-10-31 DIAGNOSIS — Z01818 Encounter for other preprocedural examination: Secondary | ICD-10-CM

## 2023-10-31 DIAGNOSIS — R262 Difficulty in walking, not elsewhere classified: Secondary | ICD-10-CM | POA: Diagnosis not present

## 2023-10-31 DIAGNOSIS — I4891 Unspecified atrial fibrillation: Secondary | ICD-10-CM | POA: Insufficient documentation

## 2023-10-31 DIAGNOSIS — Q600 Renal agenesis, unilateral: Secondary | ICD-10-CM | POA: Diagnosis not present

## 2023-10-31 DIAGNOSIS — Z951 Presence of aortocoronary bypass graft: Secondary | ICD-10-CM | POA: Insufficient documentation

## 2023-10-31 DIAGNOSIS — Z95810 Presence of automatic (implantable) cardiac defibrillator: Secondary | ICD-10-CM | POA: Insufficient documentation

## 2023-10-31 DIAGNOSIS — G8929 Other chronic pain: Secondary | ICD-10-CM | POA: Diagnosis present

## 2023-10-31 DIAGNOSIS — I252 Old myocardial infarction: Secondary | ICD-10-CM | POA: Insufficient documentation

## 2023-10-31 DIAGNOSIS — F1021 Alcohol dependence, in remission: Secondary | ICD-10-CM | POA: Diagnosis not present

## 2023-10-31 HISTORY — DX: Disorder of arteries and arterioles, unspecified: I77.9

## 2023-10-31 HISTORY — DX: Polyneuropathy, unspecified: G62.9

## 2023-10-31 HISTORY — DX: Other ventricular tachycardia: I47.29

## 2023-10-31 HISTORY — DX: Difficulty in walking, not elsewhere classified: R26.2

## 2023-10-31 HISTORY — DX: Chronic passive congestion of liver: K76.1

## 2023-10-31 HISTORY — DX: Chronic pain syndrome: G89.4

## 2023-10-31 HISTORY — DX: Deficiency of other specified B group vitamins: E53.8

## 2023-10-31 HISTORY — DX: Unspecified sensorineural hearing loss: H90.5

## 2023-10-31 HISTORY — DX: Essential (primary) hypertension: I10

## 2023-10-31 HISTORY — DX: Pulmonary hypertension, unspecified: I27.20

## 2023-10-31 HISTORY — DX: Ventricular premature depolarization: I49.3

## 2023-10-31 HISTORY — DX: Post-traumatic stress disorder, unspecified: F43.10

## 2023-10-31 HISTORY — DX: Insomnia, unspecified: G47.00

## 2023-10-31 HISTORY — DX: Left bundle-branch block, unspecified: I44.7

## 2023-10-31 HISTORY — DX: Unspecified dementia, mild, without behavioral disturbance, psychotic disturbance, mood disturbance, and anxiety: F03.A0

## 2023-10-31 HISTORY — DX: Unspecified cirrhosis of liver: K74.60

## 2023-10-31 HISTORY — DX: Unspecified atrial fibrillation: I48.91

## 2023-10-31 HISTORY — DX: Renal agenesis, unilateral: Q60.0

## 2023-10-31 HISTORY — DX: Alcohol dependence, in remission: F10.21

## 2023-10-31 HISTORY — DX: Tremor, unspecified: R25.1

## 2023-10-31 HISTORY — DX: Atherosclerosis of aorta: I70.0

## 2023-10-31 HISTORY — DX: Depression, unspecified: F32.A

## 2023-10-31 HISTORY — PX: THORACIC LAMINECTOMY FOR SPINAL CORD STIMULATOR: SHX6887

## 2023-10-31 SURGERY — THORACIC LAMINECTOMY FOR SPINAL CORD STIMULATOR
Anesthesia: General

## 2023-10-31 MED ORDER — ONDANSETRON HCL 4 MG/2ML IJ SOLN
INTRAMUSCULAR | Status: AC
  Filled 2023-10-31: qty 2

## 2023-10-31 MED ORDER — ONDANSETRON HCL 4 MG/2ML IJ SOLN
INTRAMUSCULAR | Status: DC | PRN
  Administered 2023-10-31: 4 mg via INTRAVENOUS

## 2023-10-31 MED ORDER — OXYCODONE HCL 5 MG/5ML PO SOLN: 5.0000 mg | Freq: Once | ORAL | Status: AC | PRN

## 2023-10-31 MED ORDER — SODIUM CHLORIDE (PF) 0.9 % IJ SOLN
INTRAMUSCULAR | Status: DC | PRN
  Administered 2023-10-31: 20 mL

## 2023-10-31 MED ORDER — BACLOFEN 10 MG PO TABS: 10.0000 mg | ORAL_TABLET | Freq: Three times a day (TID) | ORAL | 0 refills | Status: DC | PRN

## 2023-10-31 MED ORDER — 0.9 % SODIUM CHLORIDE (POUR BTL) OPTIME
TOPICAL | Status: DC | PRN
  Administered 2023-10-31: 500 mL

## 2023-10-31 MED ORDER — FENTANYL CITRATE (PF) 100 MCG/2ML IJ SOLN: 25.0000 ug | INTRAMUSCULAR | Status: DC | PRN

## 2023-10-31 MED ORDER — PHENYLEPHRINE HCL-NACL 20-0.9 MG/250ML-% IV SOLN
INTRAVENOUS | Status: DC | PRN
  Administered 2023-10-31: 30 ug/min via INTRAVENOUS

## 2023-10-31 MED ORDER — ACETAMINOPHEN 500 MG PO TABS: 1000.0000 mg | ORAL_TABLET | Freq: Three times a day (TID) | ORAL | 0 refills | Status: DC

## 2023-10-31 MED ORDER — PROPOFOL 10 MG/ML IV BOLUS
INTRAVENOUS | Status: DC | PRN
  Administered 2023-10-31: 100 mg via INTRAVENOUS
  Administered 2023-10-31: 100 ug/kg/min via INTRAVENOUS

## 2023-10-31 MED ORDER — MIDAZOLAM HCL 2 MG/2ML IJ SOLN
INTRAMUSCULAR | Status: AC
  Filled 2023-10-31: qty 2

## 2023-10-31 MED ORDER — VANCOMYCIN HCL IN DEXTROSE 1-5 GM/200ML-% IV SOLN
INTRAVENOUS | Status: AC
Start: 2023-10-31 — End: ?
  Filled 2023-10-31: qty 200

## 2023-10-31 MED ORDER — CHLORHEXIDINE GLUCONATE 0.12 % MT SOLN
OROMUCOSAL | Status: AC
  Filled 2023-10-31: qty 15

## 2023-10-31 MED ORDER — CEFAZOLIN SODIUM-DEXTROSE 2-4 GM/100ML-% IV SOLN
INTRAVENOUS | Status: AC
  Filled 2023-10-31: qty 100

## 2023-10-31 MED ORDER — LIDOCAINE HCL (CARDIAC) PF 100 MG/5ML IV SOSY
PREFILLED_SYRINGE | INTRAVENOUS | Status: DC | PRN
  Administered 2023-10-31: 100 mg via INTRAVENOUS

## 2023-10-31 MED ORDER — ORAL CARE MOUTH RINSE: 15.0000 mL | Freq: Once | OROMUCOSAL | Status: AC

## 2023-10-31 MED ORDER — PHENYLEPHRINE HCL-NACL 20-0.9 MG/250ML-% IV SOLN
INTRAVENOUS | Status: AC
  Filled 2023-10-31: qty 250

## 2023-10-31 MED ORDER — CHLORHEXIDINE GLUCONATE 0.12 % MT SOLN
15.0000 mL | Freq: Once | OROMUCOSAL | Status: AC
  Administered 2023-10-31: 15 mL via OROMUCOSAL

## 2023-10-31 MED ORDER — FENTANYL CITRATE (PF) 100 MCG/2ML IJ SOLN
INTRAMUSCULAR | Status: AC
  Filled 2023-10-31: qty 2

## 2023-10-31 MED ORDER — BUPIVACAINE-EPINEPHRINE (PF) 0.5% -1:200000 IJ SOLN
INTRAMUSCULAR | Status: DC | PRN
  Administered 2023-10-31: 10 mL

## 2023-10-31 MED ORDER — SENNA 8.6 MG PO TABS: 1.0000 | ORAL_TABLET | Freq: Every day | ORAL | 0 refills | Status: DC

## 2023-10-31 MED ORDER — FENTANYL CITRATE (PF) 100 MCG/2ML IJ SOLN
INTRAMUSCULAR | Status: DC | PRN
  Administered 2023-10-31 (×2): 25 ug via INTRAVENOUS

## 2023-10-31 MED ORDER — CEFAZOLIN IN SODIUM CHLORIDE 2-0.9 GM/100ML-% IV SOLN
2.0000 g | Freq: Once | INTRAVENOUS | Status: AC
  Administered 2023-10-31: 2 g via INTRAVENOUS

## 2023-10-31 MED ORDER — SUCCINYLCHOLINE CHLORIDE 200 MG/10ML IV SOSY
PREFILLED_SYRINGE | INTRAVENOUS | Status: AC
  Filled 2023-10-31: qty 10

## 2023-10-31 MED ORDER — OXYCODONE HCL 5 MG PO TABS
ORAL_TABLET | ORAL | Status: AC
  Filled 2023-10-31: qty 1

## 2023-10-31 MED ORDER — DOCUSATE SODIUM 100 MG PO CAPS: 100.0000 mg | ORAL_CAPSULE | Freq: Two times a day (BID) | ORAL | 0 refills | Status: DC

## 2023-10-31 MED ORDER — REMIFENTANIL HCL 1 MG IV SOLR
INTRAVENOUS | Status: AC
  Filled 2023-10-31: qty 1000

## 2023-10-31 MED ORDER — SUCCINYLCHOLINE CHLORIDE 200 MG/10ML IV SOSY
PREFILLED_SYRINGE | INTRAVENOUS | Status: DC | PRN
  Administered 2023-10-31: 100 mg via INTRAVENOUS

## 2023-10-31 MED ORDER — DEXAMETHASONE SODIUM PHOSPHATE 10 MG/ML IJ SOLN
INTRAMUSCULAR | Status: AC
Start: 2023-10-31 — End: ?
  Filled 2023-10-31: qty 1

## 2023-10-31 MED ORDER — PROPOFOL 1000 MG/100ML IV EMUL
INTRAVENOUS | Status: AC
  Filled 2023-10-31: qty 100

## 2023-10-31 MED ORDER — CHLORHEXIDINE GLUCONATE 4 % EX SOLN: 1.0000 | CUTANEOUS | 1 refills | Status: DC

## 2023-10-31 MED ORDER — IRRISEPT - 450ML BOTTLE WITH 0.05% CHG IN STERILE WATER, USP 99.95% OPTIME
TOPICAL | Status: DC | PRN
  Administered 2023-10-31: 450 mL via TOPICAL

## 2023-10-31 MED ORDER — LACTATED RINGERS IV SOLN: INTRAVENOUS | Status: DC

## 2023-10-31 MED ORDER — OXYCODONE HCL 5 MG PO TABS
5.0000 mg | ORAL_TABLET | Freq: Once | ORAL | Status: AC | PRN
  Administered 2023-10-31: 5 mg via ORAL

## 2023-10-31 MED ORDER — SODIUM CHLORIDE (PF) 0.9 % IJ SOLN
INTRAMUSCULAR | Status: AC
  Filled 2023-10-31: qty 20

## 2023-10-31 MED ORDER — VANCOMYCIN HCL IN DEXTROSE 1-5 GM/200ML-% IV SOLN
1000.0000 mg | Freq: Once | INTRAVENOUS | Status: AC
  Administered 2023-10-31: 1000 mg via INTRAVENOUS

## 2023-10-31 MED ORDER — REMIFENTANIL HCL 1 MG IV SOLR
INTRAVENOUS | Status: DC | PRN
  Administered 2023-10-31: .05 ug/kg/min via INTRAVENOUS

## 2023-10-31 MED ORDER — OXYCODONE HCL 5 MG PO TABS: 5.0000 mg | ORAL_TABLET | ORAL | 0 refills | Status: DC | PRN

## 2023-10-31 MED ORDER — BUPIVACAINE HCL (PF) 0.5 % IJ SOLN
INTRAMUSCULAR | Status: AC
  Filled 2023-10-31: qty 30

## 2023-10-31 MED ORDER — LIDOCAINE HCL (PF) 2 % IJ SOLN
INTRAMUSCULAR | Status: AC
  Filled 2023-10-31: qty 5

## 2023-10-31 MED ORDER — MUPIROCIN 2 % EX OINT
1.0000 | TOPICAL_OINTMENT | Freq: Two times a day (BID) | CUTANEOUS | 0 refills | Status: AC
Start: 2023-10-31 — End: 2023-11-30

## 2023-10-31 MED ORDER — BUPIVACAINE LIPOSOME 1.3 % IJ SUSP
INTRAMUSCULAR | Status: AC
  Filled 2023-10-31: qty 20

## 2023-10-31 MED ORDER — DEXAMETHASONE SODIUM PHOSPHATE 10 MG/ML IJ SOLN
INTRAMUSCULAR | Status: DC | PRN
  Administered 2023-10-31: 5 mg via INTRAVENOUS

## 2023-10-31 SURGICAL SUPPLY — 42 items
BELT PT INTERSTIM MICRO SYSTEM (MISCELLANEOUS) IMPLANT
BUR NEURO DRILL SOFT 3.0X3.8M (BURR) ×1 IMPLANT
CONTROLLER HANDSET COMM KIT (NEUROSURGERY SUPPLIES) IMPLANT
DERMABOND ADVANCED .7 DNX12 (GAUZE/BANDAGES/DRESSINGS) ×2 IMPLANT
DRAPE C-ARM XRAY 36X54 (DRAPES) ×1 IMPLANT
DRAPE LAPAROTOMY 100X77 ABD (DRAPES) ×1 IMPLANT
DRSG OPSITE POSTOP 4X6 (GAUZE/BANDAGES/DRESSINGS) IMPLANT
DRSG OPSITE POSTOP 4X8 (GAUZE/BANDAGES/DRESSINGS) IMPLANT
ELECT REM PT RETURN 9FT ADLT (ELECTROSURGICAL) ×1 IMPLANT
ELECTRODE REM PT RTRN 9FT ADLT (ELECTROSURGICAL) ×1 IMPLANT
ENVELOPE ABSORB ANTIBACTERIAL (Mesh General) ×1 IMPLANT
FEE INTRAOP CADWELL SUPPLY NCS (MISCELLANEOUS) IMPLANT
FEE INTRAOP MONITOR IMPULS NCS (MISCELLANEOUS) IMPLANT
GLOVE BIOGEL PI IND STRL 8 (GLOVE) ×1 IMPLANT
GLOVE SRG 8 PF TXTR STRL LF DI (GLOVE) ×1 IMPLANT
GLOVE SURG SYN 7.5 E (GLOVE) ×2 IMPLANT
GLOVE SURG SYN 7.5 PF PI (GLOVE) ×1 IMPLANT
GOWN SRG XL LVL 3 NONREINFORCE (GOWNS) ×1 IMPLANT
INTRAOP CADWELL SUPPLY FEE NCS (MISCELLANEOUS) ×1 IMPLANT
INTRAOP MONITOR FEE IMPULS NCS (MISCELLANEOUS) ×1 IMPLANT
JET LAVAGE IRRISEPT WOUND (IRRIGATION / IRRIGATOR) ×1 IMPLANT
KIT SPINAL PRONEVIEW (KITS) ×1 IMPLANT
LAVAGE JET IRRISEPT WOUND (IRRIGATION / IRRIGATOR) ×1 IMPLANT
MANIFOLD NEPTUNE II (INSTRUMENTS) ×1 IMPLANT
NDL SAFETY ECLIPSE 18X1.5 (NEEDLE) ×1 IMPLANT
NEUROSTIM INCEPTIV (Neuro Prosthesis/Implant) IMPLANT
NEUROSTIMULATOR INCEPTIV (Neuro Prosthesis/Implant) ×1 IMPLANT
NS IRRIG 500ML POUR BTL (IV SOLUTION) ×1 IMPLANT
PACK LAMINECTOMY ARMC (PACKS) ×1 IMPLANT
PAD ARMBOARD 7.5X6 YLW CONV (MISCELLANEOUS) ×1 IMPLANT
POUCH TYRX ANTIBAC NEURO MED (Mesh General) IMPLANT
PROGRAMMER AND COMM CASE (NEUROSURGERY SUPPLIES) IMPLANT
RECHARGER SYSTEM (NEUROSURGERY SUPPLIES) IMPLANT
STAPLER SKIN PROX 35W (STAPLE) IMPLANT
STIMULATOR CORD SURESCAN MRI (Stimulator) IMPLANT
SURGIFLO W/THROMBIN 8M KIT (HEMOSTASIS) ×1 IMPLANT
SUT SILK 2 0SH CR/8 30 (SUTURE) ×1 IMPLANT
SUT STRATA 3-0 15 PS-2 (SUTURE) IMPLANT
SUT VIC AB 0 CT1 18XCR BRD 8 (SUTURE) ×2 IMPLANT
SUT VIC AB 2-0 CT1 18 (SUTURE) ×2 IMPLANT
SYR 30ML LL (SYRINGE) ×2 IMPLANT
TRAP FLUID SMOKE EVACUATOR (MISCELLANEOUS) ×1 IMPLANT

## 2023-10-31 NOTE — Anesthesia Preprocedure Evaluation (Addendum)
Anesthesia Evaluation  Patient identified by MRN, date of birth, ID band Patient awake    Reviewed: Allergy & Precautions, NPO status , Patient's Chart, lab work & pertinent test results  History of Anesthesia Complications Negative for: history of anesthetic complications  Airway Mallampati: III  TM Distance: >3 FB Neck ROM: full    Dental  (+) Chipped, Upper Dentures   Pulmonary neg shortness of breath, neg COPD, former smoker   Pulmonary exam normal        Cardiovascular Exercise Tolerance: Good hypertension, + CAD, + Past MI, + CABG and +CHF  Normal cardiovascular exam+ dysrhythmias + Cardiac Defibrillator      Neuro/Psych  PSYCHIATRIC DISORDERS      negative neurological ROS     GI/Hepatic negative GI ROS, Neg liver ROS,,,  Endo/Other  negative endocrine ROS    Renal/GU Renal disease     Musculoskeletal   Abdominal   Peds  Hematology negative hematology ROS (+)   Anesthesia Other Findings Patient has cardiac clearance for this procedure.   Past Medical History: No date: Anemia No date: Aortic atherosclerosis (HCC) 04/30/2022: ASD (atrial septal defect)     Comment:  a.) TTE 04/30/2022: trivial iatrogenic ASD No date: Atrial fibrillation (HCC)     Comment:  a.) CHA2DS2VASc = 5 (age x2, HFrEF, HTN, prior               MI/vascular disease) as of 10/30/2023; b.) s/p DCCV               01/23/2017 (150 x 1); c.) s/p DCCV 02/01/2017; d.) s/p               PVI + roof line ablation 01/12/2022; e.) s/p LAA closure               (Watchman) 03/19/2022; f.) rate/rhythm maintained on oral              metoprolol succinate; not on chronic OAC No date: B12 deficiency No date: Benign essential hypertension No date: Bilateral carotid artery disease (HCC) 07/23/2021: Cardiac resynchronization therapy defibrillator (CRT-D)  in place     Comment:  a.) [employer]tronic device CRT-D device placed 07/23/2021 No date: CHF  (congestive heart failure) (HCC)     Comment:  a.) MV 04/05/2015: EF 30-35%; b.) TTE 01/08/2017: EF               30%, glob HK, mod LVE, mod BAE, RVSP 50.5, mild PR, mod               MR/TR; c.) TTE 09/22/2020: EF 30%, glob HK, mod BAE/BVE,               RVSP 48.5, mild PR, mod MR/TR; d.) TTE 04/30/2022: EF               >55%, triv iatrogenic ASD, mod MR, triv TR No date: Chronic low back pain No date: Chronic pain syndrome No date: Chronic passive hepatic congestion No date: Cirrhosis (HCC) No date: 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) No date: Coronary artery disease     Comment:  a.) MI (unk type) 09/2006; b.) s/p 2v CABG 09/13/2006;               c.) LHC 01/23/2022: 60% pLAD, 100% mLAD, 100% D1, 100%               pLCx, 100% mLCx, 100% OM1, 70% OM2, 80% OM3, 40% pRCA,  99% mRCA, 100% dRCAl LIMA-LAD and SVG-OM1 bypass grafts               were patent --> med mgmt; d.) MV 04/05/2015: no ischemia; No date: Depression No date: Difficulty walking No date: Diverticulosis 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 No date: GERD (gastroesophageal reflux disease) No date: Gout No date: Hepatic fibrosis No date: History of alcohol dependence (HCC) 03/19/2022: History of left atrial appendage closure     Comment:  a.) 24 mm Watchman FLX device placed 03/19/2022 No date: HTN (hypertension) No date: Hyperlipidemia No date: Insomnia     Comment:  a.) on trazodone PRN No date: Iron deficiency No date: Ischemic cardiomyopathy     Comment:  a.) MV 04/05/2015: EF 30-35%; b.) TTE 01/08/2017: EF               30%; c.) TTE 09/22/2020: EF 30%;; d.) TTE 04/30/2022: EF               >55% No date: LBBB (left bundle branch block) No date: Mild dementia (HCC)     Comment:  a.) on acetylcholinesterase inhibitor (donepazil) 10/18/2023: MRSA nasal colonization     Comment:  a.) presurgical PCR (+) 10/18/2023 prior to THORACIC               LAMINECTOMY FOR SPINAL CORD STIMULATOR  PLACEMENT 09/2006: Myocardial infarction (HCC) No date: Neuropathy No date: NSVT (nonsustained ventricular tachycardia) (HCC)     Comment:  a.) multiple episodes following CRT-D placement (below               device threshold); b.) s/p VT ablation 05/24/2022 -->                significant epicardial/mid-myocardial portions of circuit              noted and procedure was prolonged -> aborted before LV               substrate was fully ablated d/t worsening LV function and              increased hemodynamic support  needs following 5               administered shocks; c.) staged VT ablation 05/31/2022 -               successful No date: Pancreatitis No date: PTSD (post-traumatic stress disorder) No date: Pulmonary hypertension (HCC)     Comment:  a.) TTE 01/08/2017: RVSP 50.5; b.) TTE 09/22/2020: RVSP               48.5 No date: PVC (premature ventricular contraction)     Comment:  a.) holter 12/25/2016: underlying A.fib with variable               ventricular response at average rate of 86 bpm (range               49-141 bpm); 2461 single PVCs and 43 PVC couplets 09/13/2006: S/P CABG x 2     Comment:  a.) LIMA-LAD, SVG-OM1 No date: SNHL (sensorineural hearing loss) No date: Spinal stenosis of lumbar region No date: Tremor  Past Surgical History: 07/23/2021: BIV [employer] ICD INSERTION CRT-D; N/A 01/23/2017: CARDIOVERSION; N/A     Comment:  Procedure: CARDIOVERSION;  Surgeon: Dalia Heading, MD;              Location: [facility] ORS;  Service: Cardiovascular;  Laterality: N/A; 02/01/2017: CARDIOVERSION; N/A     Comment:  Procedure: CARDIOVERSION;  Surgeon: Dalia Heading, MD;              Location: [facility] ORS;  Service: Cardiovascular;                Laterality: N/A; No date: CHOLECYSTECTOMY 03/05/2019: COLONOSCOPY WITH PROPOFOL; N/A     Comment:  Procedure: COLONOSCOPY WITH PROPOFOL;  Surgeon:               Christena Deem, MD;  Location: [facility] ENDOSCOPY;                 Service: Endoscopy;  Laterality: N/A; 09/13/2006: CORONARY ARTERY BYPASS GRAFT; N/A 03/05/2019: ESOPHAGOGASTRODUODENOSCOPY (EGD) WITH PROPOFOL; N/A     Comment:  Procedure: ESOPHAGOGASTRODUODENOSCOPY (EGD) WITH               PROPOFOL;  Surgeon: Christena Deem, MD;  Location:               [facility] ENDOSCOPY;  Service: Endoscopy;  Laterality: N/A; 09/2006: LEFT HEART CATH AND CORONARY ANGIOGRAPHY; Left 01/23/2022: LEFT HEART CATH AND CORS/GRAFTS ANGIOGRAPHY; Left No date: LUMBAR SPINE SURGERY     Comment:  PIN AND CAGE PLACED No date: SACROILIAC JOINT FUSION; Right No date: SHOULDER ARTHROSCOPY; Right No date: TOTAL KNEE ARTHROPLASTY; Bilateral 05/24/2022: V TACH ABLATION; N/A     Comment:  aborted due to decreased LV function and hemodynamic               instability; staged procedure planned 05/31/2022: V TACH ABLATION (successful staged procedure); N/A 03/19/2022: WATCHMAN IMPLANT; N/A  BMI    Body Mass Index: 30.29 kg/m      Reproductive/Obstetrics negative OB ROS                             Anesthesia Physical Anesthesia Plan  ASA: 4  Anesthesia Plan: General ETT   Post-op Pain Management:    Induction: Intravenous  PONV Risk Score and Plan: Ondansetron, Dexamethasone, Midazolam and Treatment may vary due to age or medical condition  Airway Management Planned: Oral ETT  Additional Equipment:   Intra-op Plan:   Post-operative Plan: Extubation in OR  Informed Consent: I have reviewed the patients History and Physical, chart, labs and discussed the procedure including the risks, benefits and alternatives for the proposed anesthesia with the patient or authorized representative who has indicated his/her understanding and acceptance.     Dental Advisory Given  Plan Discussed with: Anesthesiologist, CRNA and Surgeon  Anesthesia Plan Comments: (Patient and wife consented for risks of anesthesia including but not limited to:  - adverse  reactions to medications - damage to eyes, teeth, lips or other oral mucosa - nerve damage due to positioning  - sore throat or hoarseness - Damage to heart, brain, nerves, lungs, other parts of body or loss of life  Patient voiced understanding and assent.)       Anesthesia Quick Evaluation

## 2023-10-31 NOTE — Op Note (Signed)
Indications: the patient is a 78 yo male who was diagnosed with chronic pain syndrome, failed back syndrome, prior spinal fusion. The patient had a successful trial for spinal cord stimulation, so was consented for placement of a permanent device   Findings: successful placement of a thoracic spinal cord stimulator.    Preoperative Diagnosis:  Chronic pain syndrome Failed back syndrome Previous spinal fusion  Postoperative Diagnosis: same     EBL: 20 ml IVF: see anesthesia record Drains: none Disposition: Extubated and Stable to PACU Complications: none   No foley catheter was placed.     Preoperative Note:    Risks of surgery discussed in clinic.   Operative Note:      The patient was then brought from the preoperative center with intravenous access established.  The patient underwent general anesthesia and endotracheal tube intubation, then was rotated on the [facility] table where all pressure points were appropriately padded.  An incision was marked with flouroscopy at T10 and on the left flank. The skin was then thoroughly cleansed.  Perioperative antibiotic prophylaxis was administered.  Sterile prep and drapes were then applied and a timeout was then observed.     Once this was complete an incision was opened with the use of a #10 blade knife in the midline at the thoracic incision.  The paraspinus muscled were subperiosteally dissected until the laminae of T10 and T9 were visualized. Flouroscopy was used to confirm the level. A self-retaining retractor was placed.   The rongeur was used to remove the spinous process of T10.  The drill was used to thin the bone until the ligamentum flavum was visualized.  The ligamentum was then removed and the dura visualized. This was widened until placement of the paddle lead was possible.     The lead was then advanced to the T8 top of body the top of the lead.  The lead was secured with a 2-0 silk suture.     The incision on the flank  was then opened and a pocket formed until it was large enough for the pulse generator.  The tunneler was used to connect between the pocket and the incision.  The lead was inserted into the tunneler and tunneled to the buttock.  The leads were attached to the IPG and impedances checked.  The leads were then tightened.  The IPG was then inserted into the pouch.   Both sites were irrigated, with Irrisept and then a antibiotic pouch was placed at the IPG site and a small fragment of antibiotic pouch was placed at the thoracic site.  The wounds were closed in layers with 0 and 2-0 vicryl.  The skin was approximated with stratafix and glue. A sterile dressing was applied.   Monitoring was stable throughout, collision tested showed bilateral stimulation and interference.   Patient was then rotated back to the preoperative bed awakened from anesthesia and taken to recovery. All counts are correct in this case.   I performed the procedure without an assistant surgeon  Implant Name Type Inv. Item Serial No. Manufacturer Lot No. LRB No. Used Action  STIMULATOR CORD SURESCAN MRI - [id number] Stimulator STIMULATOR CORD SURESCAN MRI  MEDTRONIC NEUROMOD PAIN MGMT [id number] N/A 1 Implanted  NEUROSTIMULATOR INCEPTIV - [id number] H Neuro Prosthesis/Implant NEUROSTIMULATOR INCEPTIV [id number] H MEDTRONIC NEUROMOD PAIN MGMT  N/A 1 Implanted  ENVELOPE ABSORB ANTIBACTERIAL - [id number] Mesh General ENVELOPE ABSORB ANTIBACTERIAL  MEDTRONIC NEUROMOD PAIN MGMT [id number] N/A 1 Implanted    Lovenia Kim, MD

## 2023-10-31 NOTE — Anesthesia Postprocedure Evaluation (Signed)
Anesthesia Post Note  Patient: Press Recruitment consultant  Procedure(s) Performed: THORACIC LAMINECTOMY FOR SPINAL CORD STIMULATOR PLACEMENT ([employer]TR[employer]IC)  Patient location during evaluation: PACU Anesthesia Type: General Level of consciousness: awake and alert Pain management: pain level controlled Vital Signs Assessment: post-procedure vital signs reviewed and stable Respiratory status: spontaneous breathing, nonlabored ventilation and respiratory function stable Cardiovascular status: blood pressure returned to baseline and stable Postop Assessment: no apparent nausea or vomiting Anesthetic complications: no   No notable events documented.   Last Vitals:  Vitals:   10/31/23 1700 10/31/23 1713  BP: (!) 132/59 138/84  Pulse: (!) 58 75  Resp: 10 14  Temp:  (!) 36.2 C  SpO2: 94% 94%    Last Pain:  Vitals:   10/31/23 1750  TempSrc:   PainSc: 3                  Foye Deer

## 2023-10-31 NOTE — Transfer of Care (Signed)
Immediate Anesthesia Transfer of Care Note  Patient: Eliezer Champagne  Procedure(s) Performed: THORACIC LAMINECTOMY FOR SPINAL CORD STIMULATOR PLACEMENT (MEDTRONIC)  Patient Location: PACU  Anesthesia Type:General  Level of Consciousness: drowsy  Airway & Oxygen Therapy: Patient Spontanous Breathing and Patient connected to face mask oxygen  Post-op Assessment: Report given to RN and Post -op Vital signs reviewed and stable  Post vital signs: Reviewed and stable  Last Vitals:  Vitals Value Taken Time  BP 121/61 10/31/23 1555  Temp    Pulse 62 10/31/23 1558  Resp 10 10/31/23 1558  SpO2 100 % 10/31/23 1558  Vitals shown include unfiled device data.  Last Pain:  Vitals:   10/31/23 1029  PainSc: 6          Complications: No notable events documented.

## 2023-10-31 NOTE — Interval H&P Note (Signed)
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  The various methods of treatment have been discussed with the patient and family. After consideration of risks, benefits and other options for treatment, the patient has consented to  Procedure(s): THORACIC LAMINECTOMY FOR SPINAL CORD STIMULATOR PLACEMENT (MEDTRONIC) (N/A) as a surgical intervention.  The patient's history has been reviewed, patient examined, no change in status, stable for surgery.  I have reviewed the patient's chart and labs.  Questions were answered to the patient's satisfaction.    Heart and lungs clear.   Lovenia Kim

## 2023-10-31 NOTE — Discharge Instructions (Addendum)
  Your surgeon has performed an operation on your spine (low back). Many times, patients feel better immediately after surgery and can "overdo it." Even if you feel well, it is important that you follow these activity guidelines. If you do not let your back heal properly from the surgery, you can increase the chance of  complications.The following are instructions to help in your recovery once you have been discharged from the hospital.   Activity    No bending, lifting, or twisting ("BLT"). Avoid lifting objects heavier than 10 pounds (gallon milk jug).  Where possible, avoid household activities that involve lifting, bending, pushing, or pulling such as laundry, vacuuming, grocery shopping, and childcare. Try to arrange for help from friends and family for these activities while your back heals.  Increase physical activity slowly as tolerated.  Taking short walks is encouraged, but avoid strenuous exercise. Do not jog, run, bicycle, lift weights, or participate in any other exercises unless specifically allowed by your doctor. Avoid prolonged sitting, including car rides.  Talk to your doctor before resuming sexual activity.  You should not drive until cleared by your doctor.  Until released by your doctor, you should not return to work or school.  You should rest at home and let your body heal.   You may shower three days after your surgery.  After showering, lightly dab your incision dry. Do not take a tub bath or go swimming for 3 weeks, or until approved by your doctor at your follow-up appointment.  If you smoke, we strongly recommend that you quit.  Smoking has been proven to interfere with normal healing in your back and will dramatically reduce the success rate of your surgery. Please contact [facility] (800-QUIT-NOW) and use the resources at www.[facility].com for assistance in stopping smoking.  Surgical Incision   If you have a dressing on your incision, you may remove it three days  after your surgery. Keep your incision area clean and dry.  Your incision was closed with Dermabond glue. The glue should begin to peel away within about a week.  Diet            You may return to your usual diet. Be sure to stay hydrated.  When to Contact us  Although your surgery and recovery will likely be uneventful, you may have some residual numbness, aches, and pains in your back and/or legs. This is normal and should improve in the next few weeks.  However, should you experience any of the following, contact us immediately: New numbness or weakness Pain that is progressively getting worse, and is not relieved by your pain medications or rest Bleeding, redness, swelling, pain, or drainage from surgical incision Chills or flu-like symptoms Fever greater than 101.0 F (38.3 C) Problems with bowel or bladder functions Difficulty breathing or shortness of breath Warmth, tenderness, or swelling in your calf  Contact Information How to contact us:  If you have any questions/concerns before or after surgery, you can reach [place] at [phone], or you can send a mychart message. We can be reached by phone or mychart 8am-4pm, Monday-Friday.  *Please note: Calls after 4pm are forwarded to a third party answering service. Mychart messages are not routinely monitored during evenings, weekends, and holidays. Please call our office to contact the answering service for urgent concerns during non-business hours.

## 2023-10-31 NOTE — Anesthesia Procedure Notes (Signed)
Procedure Name: Intubation Date/Time: 10/31/2023 1:59 PM  Performed by: Elisabeth Pigeon, CRNAPre-anesthesia Checklist: Patient identified, Patient being monitored, Timeout performed, Emergency Drugs available and Suction available Patient Re-evaluated:Patient Re-evaluated prior to induction Oxygen Delivery Method: Circle system utilized Preoxygenation: Pre-oxygenation with 100% oxygen Induction Type: IV induction Ventilation: Mask ventilation without difficulty and Oral airway inserted - appropriate to patient size Laryngoscope Size: Mac, McGrath and 4 Grade View: Grade I Tube type: Oral Tube size: 7.5 mm Number of attempts: 1 Airway Equipment and Method: Stylet Placement Confirmation: ETT inserted through vocal cords under direct vision, positive ETCO2 and breath sounds checked- equal and bilateral Secured at: 23 cm Tube secured with: Tape Dental Injury: Teeth and Oropharynx as per pre-operative assessment

## 2023-11-01 ENCOUNTER — Encounter: Payer: Self-pay | Admitting: Neurosurgery

## 2023-11-06 ENCOUNTER — Encounter: Payer: No Typology Code available for payment source | Admitting: Physician Assistant

## 2023-11-13 ENCOUNTER — Encounter: Payer: Self-pay | Admitting: Physician Assistant

## 2023-11-13 ENCOUNTER — Ambulatory Visit (INDEPENDENT_AMBULATORY_CARE_PROVIDER_SITE_OTHER): Payer: No Typology Code available for payment source | Admitting: Physician Assistant

## 2023-11-13 VITALS — BP 118/64 | Temp 97.6°F | Ht 65.0 in | Wt 182.0 lb

## 2023-11-13 DIAGNOSIS — M961 Postlaminectomy syndrome, not elsewhere classified: Secondary | ICD-10-CM

## 2023-11-13 DIAGNOSIS — Z09 Encounter for follow-up examination after completed treatment for conditions other than malignant neoplasm: Secondary | ICD-10-CM

## 2023-11-13 DIAGNOSIS — Z9689 Presence of other specified functional implants: Secondary | ICD-10-CM

## 2023-11-13 NOTE — Progress Notes (Signed)
   REFERRING PHYSICIAN:  [facility] [address] [place],  [place] [address]  DOS: 10/31/23, thoracic laminectomy for spinal cord stimulator placement  HISTORY OF PRESENT ILLNESS: Quinn Quam is approximately 2 weeks status post thoracic laminectomy for spinal cord stimulator placement. he is doing fine.  He continues to have quite a bit of pain and is taking oxycodone which she feels as though does not help that much.  His pain is not worse, but still present.  He he notes some concern over quite a bit of bruising on his back and side.No new numbness, weakness.  PHYSICAL EXAMINATION:  General: Patient is well developed, well nourished, calm, collected, and in no apparent distress.   NEUROLOGICAL:  General: In no acute distress.   Awake, alert, oriented to person, place, and time.  Pupils equal round and reactive to light.  Facial tone is symmetric.  Tongue protrusion is midline.  There is no pronator drift.   Strength:            Side Iliopsoas Quads Hamstring PF DF EHL  R 5 5 5 5 5 5   L 5 5 5 5 5 5    Incisions c/d/I.  Extensive bruising in patient's back extending to his left flank and outer abdomen.   ROS (Neurologic):  Negative except as noted above  IMAGING: No recent imaging.  ASSESSMENT/PLAN:  Eliezer Champagne is approximately 2 weeks status post thoracic laminectomy for spinal cord stimulator placement. he is doing fine.  He continues to have quite a bit of pain and is taking oxycodone which she feels as though does not help that much.  His pain is not worse, but still present.  He he notes some concern over quite a bit of bruising on his back and side.No new numbness, weakness.  His incisions are well-appearing.  Extensive bruising is noted.  [employer] representatives are here in clinic today to optimize his stimulator.  Plan to see back in approximately 1 month for 6-week postop visit.  Encouraged to reach out to [place] for questions or concerns.I have  advised the patient to lift up to 10 pounds until 6 weeks after surgery, then increase up to 25 pounds until 12 weeks after surgery.  After 12 weeks post-op, the patient advised to increase activity as tolerated.  Advised to contact the office if any questions or concerns arise.  Joan Flores PA-C Department of neurosurgery

## 2023-11-29 ENCOUNTER — Ambulatory Visit (INDEPENDENT_AMBULATORY_CARE_PROVIDER_SITE_OTHER)

## 2023-11-29 VITALS — Temp 97.9°F

## 2023-11-29 DIAGNOSIS — Z09 Encounter for follow-up examination after completed treatment for conditions other than malignant neoplasm: Secondary | ICD-10-CM

## 2023-11-29 DIAGNOSIS — T8189XA Other complications of procedures, not elsewhere classified, initial encounter: Secondary | ICD-10-CM

## 2023-11-29 DIAGNOSIS — M961 Postlaminectomy syndrome, not elsewhere classified: Secondary | ICD-10-CM

## 2023-11-29 MED ORDER — CEPHALEXIN 500 MG PO CAPS: 500.0000 mg | ORAL_CAPSULE | Freq: Two times a day (BID) | ORAL | 0 refills | Status: AC

## 2023-11-29 NOTE — Patient Instructions (Addendum)
 Antibiotics (keflex/cephalexin) were sent to [facility]. Please start these today.  Gently clean incision daily with mild soap and water. Pat dry.   Please try to avoid touching or scratching at incisions (bacteria under fingernails can contribute to infections).  Keep appointment with Dr Katrinka Blazing for 12/04/23.   Please contact us immediately if: - redness or pain is worsening - if you notice any drainage from incision - if you have a temperature greater than 101  Continue to follow discharge instructions that you were previously given from the hospital.

## 2023-11-29 NOTE — Progress Notes (Signed)
 DOS: 10/31/23, thoracic laminectomy for spinal cord stimulator placement   Mr Nelis presented to our office today to meet with the [employer] rep for reprogramming. The [employer] rep notified me that he did not need any reprogramming, as all of his pain was incisional. I spoke with Mr and Mrs Bekker in office today. Mr Sunga reports that his incisions feel like fire and they are painful to touch. Mrs Champlain denies seeing any drainage from either of Mr Tudisco's incisions. He is afebrile. I contacted Dr Katrinka Blazing and he reviewed the pictures of Mr Holston's incisions. Per Dr Katrinka Blazing, I have sent in a prescription for cephalexin 500mg  BID x 7 days. We will keep the currently scheduled appointment for 12/04/23. I reviewed strict return/contact instructions as well as incision care instructions with Mr and Mrs Herberger.       Sharlot Gowda, RN [facility] Neurosurgery at [facility]

## 2023-12-04 ENCOUNTER — Ambulatory Visit (INDEPENDENT_AMBULATORY_CARE_PROVIDER_SITE_OTHER): Payer: No Typology Code available for payment source | Admitting: Neurosurgery

## 2023-12-04 VITALS — BP 124/74 | Temp 98.3°F | Ht 65.0 in | Wt 182.0 lb

## 2023-12-04 DIAGNOSIS — M961 Postlaminectomy syndrome, not elsewhere classified: Secondary | ICD-10-CM

## 2023-12-04 DIAGNOSIS — Z09 Encounter for follow-up examination after completed treatment for conditions other than malignant neoplasm: Secondary | ICD-10-CM

## 2023-12-04 NOTE — Progress Notes (Signed)
   REFERRING PHYSICIAN:  [facility] [address] [place],  [place] [address]  DOS: 10/31/23, thoracic laminectomy for spinal cord stimulator placement  HISTORY OF PRESENT ILLNESS: Danny Marquez is approximately 6 weeks status post thoracic laminectomy for spinal cord stimulator placement.  He has had a recent fever and was on antibiotics.  His incision is improved and it is appearance.  He does feel like his hip pain is improved significantly.  He has 1 area at the superior aspect of his incision that is bothering him still.  PHYSICAL EXAMINATION:  General: Patient is well developed, well nourished, calm, collected, and in no apparent distress.   NEUROLOGICAL:  General: In no acute distress.   Awake, alert, oriented to person, place, and time.  Pupils equal round and reactive to light.  Facial tone is symmetric.  Tongue protrusion is midline.  There is no pronator drift.   Strength:            Side Iliopsoas Quads Hamstring PF DF EHL  R 5 5 5 5 5 5   L 5 5 5 5 5 5    Incisions c/d/I.  Extensive bruising in patient's back extending to his left flank and outer abdomen.   ROS (Neurologic):  Negative except as noted above  IMAGING: No recent imaging.  ASSESSMENT/PLAN:  Danny Marquez is approximately 6 weeks status post thoracic laminectomy for spinal cord stimulator placement.  Overall he is improving.  He states that his hip pain is gone.  His stimulator continues to function.  In regards to his back pain he does have 1 area in the superior aspect of his incision which is quite tender to the touch.  Feels as though it is a stitch granuloma.  I let him know that he could place a callus type bandage/dressing over top of it and build this up so that he is not putting direct pressure on this sore point.  He has worked again with the stimulator rep today to improve his titration.  Will continue to follow.  Lovenia Kim, MD Department of neurosurgery

## 2023-12-10 ENCOUNTER — Encounter: Payer: Self-pay | Admitting: Emergency Medicine

## 2023-12-10 ENCOUNTER — Other Ambulatory Visit: Payer: Self-pay | Admitting: Physician Assistant

## 2023-12-10 ENCOUNTER — Inpatient Hospital Stay
Admission: EM | Admit: 2023-12-10 | Discharge: 2023-12-14 | DRG: 857 | Disposition: A | Discharge status: Home / Self Care | Source: Ambulatory Visit | Attending: Internal Medicine | Admitting: Internal Medicine

## 2023-12-10 ENCOUNTER — Telehealth: Payer: Self-pay

## 2023-12-10 ENCOUNTER — Other Ambulatory Visit: Payer: Self-pay

## 2023-12-10 ENCOUNTER — Emergency Department

## 2023-12-10 DIAGNOSIS — G629 Polyneuropathy, unspecified: Secondary | ICD-10-CM | POA: Diagnosis present

## 2023-12-10 DIAGNOSIS — K7400 Hepatic fibrosis, unspecified: Secondary | ICD-10-CM | POA: Diagnosis present

## 2023-12-10 DIAGNOSIS — Z885 Allergy status to narcotic agent status: Secondary | ICD-10-CM

## 2023-12-10 DIAGNOSIS — H905 Unspecified sensorineural hearing loss: Secondary | ICD-10-CM | POA: Diagnosis present

## 2023-12-10 DIAGNOSIS — T8149XA Infection following a procedure, other surgical site, initial encounter: Secondary | ICD-10-CM | POA: Diagnosis present

## 2023-12-10 DIAGNOSIS — E871 Hypo-osmolality and hyponatremia: Secondary | ICD-10-CM | POA: Diagnosis not present

## 2023-12-10 DIAGNOSIS — K7460 Unspecified cirrhosis of liver: Secondary | ICD-10-CM | POA: Diagnosis present

## 2023-12-10 DIAGNOSIS — I272 Pulmonary hypertension, unspecified: Secondary | ICD-10-CM | POA: Diagnosis present

## 2023-12-10 DIAGNOSIS — Z7984 Long term (current) use of oral hypoglycemic drugs: Secondary | ICD-10-CM | POA: Diagnosis not present

## 2023-12-10 DIAGNOSIS — E663 Overweight: Secondary | ICD-10-CM | POA: Diagnosis present

## 2023-12-10 DIAGNOSIS — Z87891 Personal history of nicotine dependence: Secondary | ICD-10-CM

## 2023-12-10 DIAGNOSIS — Z905 Acquired absence of kidney: Secondary | ICD-10-CM

## 2023-12-10 DIAGNOSIS — Z8249 Family history of ischemic heart disease and other diseases of the circulatory system: Secondary | ICD-10-CM

## 2023-12-10 DIAGNOSIS — I4819 Other persistent atrial fibrillation: Secondary | ICD-10-CM | POA: Diagnosis present

## 2023-12-10 DIAGNOSIS — B9562 Methicillin resistant Staphylococcus aureus infection as the cause of diseases classified elsewhere: Secondary | ICD-10-CM | POA: Diagnosis present

## 2023-12-10 DIAGNOSIS — F4310 Post-traumatic stress disorder, unspecified: Secondary | ICD-10-CM | POA: Diagnosis present

## 2023-12-10 DIAGNOSIS — F1020 Alcohol dependence, uncomplicated: Secondary | ICD-10-CM | POA: Diagnosis present

## 2023-12-10 DIAGNOSIS — Y753 Surgical instruments, materials and neurological devices (including sutures) associated with adverse incidents: Secondary | ICD-10-CM | POA: Diagnosis present

## 2023-12-10 DIAGNOSIS — Z9682 Presence of neurostimulator: Secondary | ICD-10-CM

## 2023-12-10 DIAGNOSIS — G894 Chronic pain syndrome: Secondary | ICD-10-CM | POA: Diagnosis present

## 2023-12-10 DIAGNOSIS — Y828 Other medical devices associated with adverse incidents: Secondary | ICD-10-CM | POA: Diagnosis present

## 2023-12-10 DIAGNOSIS — Q211 Atrial septal defect, unspecified: Secondary | ICD-10-CM | POA: Diagnosis not present

## 2023-12-10 DIAGNOSIS — F05 Delirium due to known physiological condition: Secondary | ICD-10-CM | POA: Diagnosis not present

## 2023-12-10 DIAGNOSIS — I447 Left bundle-branch block, unspecified: Secondary | ICD-10-CM | POA: Diagnosis present

## 2023-12-10 DIAGNOSIS — R262 Difficulty in walking, not elsewhere classified: Secondary | ICD-10-CM | POA: Diagnosis present

## 2023-12-10 DIAGNOSIS — I252 Old myocardial infarction: Secondary | ICD-10-CM

## 2023-12-10 DIAGNOSIS — I255 Ischemic cardiomyopathy: Secondary | ICD-10-CM | POA: Diagnosis present

## 2023-12-10 DIAGNOSIS — I4891 Unspecified atrial fibrillation: Secondary | ICD-10-CM | POA: Diagnosis present

## 2023-12-10 DIAGNOSIS — T8141XA Infection following a procedure, superficial incisional surgical site, initial encounter: Secondary | ICD-10-CM | POA: Diagnosis present

## 2023-12-10 DIAGNOSIS — F03A11 Unspecified dementia, mild, with agitation: Secondary | ICD-10-CM | POA: Diagnosis present

## 2023-12-10 DIAGNOSIS — Q600 Renal agenesis, unilateral: Secondary | ICD-10-CM

## 2023-12-10 DIAGNOSIS — M48061 Spinal stenosis, lumbar region without neurogenic claudication: Secondary | ICD-10-CM | POA: Diagnosis present

## 2023-12-10 DIAGNOSIS — I4821 Permanent atrial fibrillation: Secondary | ICD-10-CM | POA: Diagnosis not present

## 2023-12-10 DIAGNOSIS — M109 Gout, unspecified: Secondary | ICD-10-CM | POA: Diagnosis present

## 2023-12-10 DIAGNOSIS — B9561 Methicillin susceptible Staphylococcus aureus infection as the cause of diseases classified elsewhere: Secondary | ICD-10-CM | POA: Diagnosis not present

## 2023-12-10 DIAGNOSIS — E538 Deficiency of other specified B group vitamins: Secondary | ICD-10-CM | POA: Diagnosis present

## 2023-12-10 DIAGNOSIS — K219 Gastro-esophageal reflux disease without esophagitis: Secondary | ICD-10-CM | POA: Diagnosis present

## 2023-12-10 DIAGNOSIS — E785 Hyperlipidemia, unspecified: Secondary | ICD-10-CM | POA: Diagnosis present

## 2023-12-10 DIAGNOSIS — I2510 Atherosclerotic heart disease of native coronary artery without angina pectoris: Secondary | ICD-10-CM | POA: Diagnosis present

## 2023-12-10 DIAGNOSIS — Z951 Presence of aortocoronary bypass graft: Secondary | ICD-10-CM

## 2023-12-10 DIAGNOSIS — I10 Essential (primary) hypertension: Secondary | ICD-10-CM | POA: Diagnosis present

## 2023-12-10 DIAGNOSIS — Z9689 Presence of other specified functional implants: Secondary | ICD-10-CM

## 2023-12-10 DIAGNOSIS — Z79899 Other long term (current) drug therapy: Secondary | ICD-10-CM

## 2023-12-10 DIAGNOSIS — T8579XA Infection and inflammatory reaction due to other internal prosthetic devices, implants and grafts, initial encounter: Secondary | ICD-10-CM | POA: Diagnosis present

## 2023-12-10 DIAGNOSIS — I110 Hypertensive heart disease with heart failure: Secondary | ICD-10-CM | POA: Diagnosis present

## 2023-12-10 DIAGNOSIS — F32A Depression, unspecified: Secondary | ICD-10-CM | POA: Diagnosis present

## 2023-12-10 DIAGNOSIS — L089 Local infection of the skin and subcutaneous tissue, unspecified: Principal | ICD-10-CM

## 2023-12-10 DIAGNOSIS — I5022 Chronic systolic (congestive) heart failure: Secondary | ICD-10-CM | POA: Diagnosis present

## 2023-12-10 DIAGNOSIS — Z96653 Presence of artificial knee joint, bilateral: Secondary | ICD-10-CM | POA: Diagnosis present

## 2023-12-10 DIAGNOSIS — F03A0 Unspecified dementia, mild, without behavioral disturbance, psychotic disturbance, mood disturbance, and anxiety: Secondary | ICD-10-CM | POA: Insufficient documentation

## 2023-12-10 DIAGNOSIS — Z6826 Body mass index (BMI) 26.0-26.9, adult: Secondary | ICD-10-CM

## 2023-12-10 DIAGNOSIS — M549 Dorsalgia, unspecified: Secondary | ICD-10-CM | POA: Diagnosis present

## 2023-12-10 DIAGNOSIS — Z95810 Presence of automatic (implantable) cardiac defibrillator: Secondary | ICD-10-CM

## 2023-12-10 LAB — LACTIC ACID, PLASMA: Lactic Acid, Venous: 0.6 mmol/L (ref 0.5–1.9)

## 2023-12-10 LAB — CBC WITH DIFFERENTIAL/PLATELET
Abs Immature Granulocytes: 0.02 10*3/uL (ref 0.00–0.07)
Basophils Absolute: 0 10*3/uL (ref 0.0–0.1)
Basophils Relative: 1 %
Eosinophils Absolute: 0.6 10*3/uL — ABNORMAL HIGH (ref 0.0–0.5)
Eosinophils Relative: 10 %
HCT: 41.9 % (ref 39.0–52.0)
Hemoglobin: 13 g/dL (ref 13.0–17.0)
Immature Granulocytes: 0 %
Lymphocytes Relative: 16 %
Lymphs Abs: 0.9 10*3/uL (ref 0.7–4.0)
MCH: 27.7 pg (ref 26.0–34.0)
MCHC: 31 g/dL (ref 30.0–36.0)
MCV: 89.3 fL (ref 80.0–100.0)
Monocytes Absolute: 0.4 10*3/uL (ref 0.1–1.0)
Monocytes Relative: 7 %
Neutro Abs: 3.5 10*3/uL (ref 1.7–7.7)
Neutrophils Relative %: 66 %
Platelets: 261 10*3/uL (ref 150–400)
RBC: 4.69 MIL/uL (ref 4.22–5.81)
RDW: 15.3 % (ref 11.5–15.5)
WBC: 5.3 10*3/uL (ref 4.0–10.5)
nRBC: 0 % (ref 0.0–0.2)

## 2023-12-10 LAB — BASIC METABOLIC PANEL WITH GFR
Anion gap: 6 (ref 5–15)
BUN: 14 mg/dL (ref 8–23)
CO2: 26 mmol/L (ref 22–32)
Calcium: 9.8 mg/dL (ref 8.9–10.3)
Chloride: 106 mmol/L (ref 98–111)
Creatinine, Ser: 1.21 mg/dL (ref 0.61–1.24)
GFR, Estimated: >60 mL/min (ref >60)
Glucose, Bld: 101 mg/dL — ABNORMAL HIGH (ref 70–99)
Potassium: 4.4 mmol/L (ref 3.5–5.1)
Sodium: 138 mmol/L (ref 135–145)

## 2023-12-10 MED ORDER — VANCOMYCIN HCL 2000 MG/400ML IV SOLN
2000.0000 mg | Freq: Once | INTRAVENOUS | Status: AC
  Administered 2023-12-10: 2000 mg via INTRAVENOUS
  Filled 2023-12-10: qty 400

## 2023-12-10 MED ORDER — ONDANSETRON HCL 4 MG/2ML IJ SOLN
4.0000 mg | Freq: Four times a day (QID) | INTRAMUSCULAR | Status: DC | PRN
  Administered 2023-12-11: 4 mg via INTRAVENOUS

## 2023-12-10 MED ORDER — MORPHINE SULFATE (PF) 2 MG/ML IV SOLN
2.0000 mg | INTRAVENOUS | Status: DC | PRN
  Administered 2023-12-11 – 2023-12-13 (×3): 2 mg via INTRAVENOUS
  Filled 2023-12-10 (×3): qty 1

## 2023-12-10 MED ORDER — OXYCODONE HCL 5 MG PO TABS
5.0000 mg | ORAL_TABLET | ORAL | Status: DC | PRN
  Administered 2023-12-12 – 2023-12-14 (×3): 5 mg via ORAL
  Filled 2023-12-10 (×4): qty 1

## 2023-12-10 MED ORDER — VANCOMYCIN HCL 1500 MG/300ML IV SOLN
1500.0000 mg | INTRAVENOUS | Status: DC
  Filled 2023-12-10: qty 300

## 2023-12-10 MED ORDER — ACETAMINOPHEN 325 MG PO TABS: 650.0000 mg | ORAL_TABLET | Freq: Four times a day (QID) | ORAL | Status: DC | PRN

## 2023-12-10 MED ORDER — SODIUM CHLORIDE 0.9 % IV SOLN
2.0000 g | Freq: Once | INTRAVENOUS | Status: AC
  Administered 2023-12-10: 2 g via INTRAVENOUS
  Filled 2023-12-10: qty 20

## 2023-12-10 MED ORDER — ACETAMINOPHEN 650 MG RE SUPP: 650.0000 mg | Freq: Four times a day (QID) | RECTAL | Status: DC | PRN

## 2023-12-10 MED ORDER — ONDANSETRON HCL 4 MG PO TABS: 4.0000 mg | ORAL_TABLET | Freq: Four times a day (QID) | ORAL | Status: DC | PRN

## 2023-12-10 MED ORDER — SULFAMETHOXAZOLE-TRIMETHOPRIM 800-160 MG PO TABS: 1.0000 | ORAL_TABLET | Freq: Two times a day (BID) | ORAL | 0 refills | Status: DC

## 2023-12-10 MED ORDER — ALBUTEROL SULFATE (2.5 MG/3ML) 0.083% IN NEBU: 2.5000 mg | INHALATION_SOLUTION | RESPIRATORY_TRACT | Status: DC | PRN

## 2023-12-10 NOTE — Progress Notes (Addendum)
 Patient underwent thoracic laminectomy for spinal cord stimulator placement on 10/31/2023.  Unfortunately developed some redness at his incision site approximately 2 weeks ago and was given a week of Keflex.  Initially it did appear to improve, but now it appears to be worsening with active drainage at his incision site that we were notified of today.  He denies any new fever, chills, neurological symptoms.  Hospitalist team has been made aware of this patient.  Plan for labs once in the ED as well as x-rays of his thoracic and lumbar spine.    Plan to admit to medicine and have patient undergo surgery tomorrow with Dr. Adriana Simas for postop infection-wound washout and stimulator removal.

## 2023-12-10 NOTE — Telephone Encounter (Signed)
 Patient's Wife called to report bleeding from SCS site.   We asked them to send a photo so we can further evaluate.   Brooke PA is aware.

## 2023-12-10 NOTE — ED Triage Notes (Signed)
 Patient to ED via POV- sent from Dr Patsey Berthold office for "pre-admission." Pt has a spinal stimulator that is suppose to be removed due to infection. C/o lower back pain.

## 2023-12-10 NOTE — Consult Note (Signed)
 Pharmacy Antibiotic Note  Danny Marquez is a 78 y.o. male admitted on 12/10/2023 with cellulitis. Patient underwent thoracic laminectomy for spinal cord stimulator placement on 10/31/2023. Unfortunately developed some redness at his incision site approximately 2 weeks ago and was given a week of Keflex. Initially it did appear to improve, but now it appears to be worsening with active drainage at his incision site. Pharmacy has been consulted for Vancomycin dosing for 7 days duration.  Today, 12/10/2023 Scr 1.21 (at BL) WBC 5.3  Afebrile  Already received Ceftriaxone 2g x 1  Plan: Day 1 of antibiotics Give vancomycin 2000 mg IV x1  Followed by 1500 mg IV Q24H. Goal AUC 400-550. Expected AUC: 538.5 Expected Css min: 13.0 SCr used: 1.21  Weight used: IBW, Vd used: 0.72 (BMI 26.9) Continue to monitor renal function and follow culture results  Height: 5\' 9"  (175.3 cm) Weight: 82.6 kg (182 lb) IBW/kg (Calculated) : 70.7  Temp (24hrs), Avg:98.3 F (36.8 C), Min:98.3 F (36.8 C), Max:98.3 F (36.8 C)  Recent Labs  Lab 12/10/23 1622  WBC 5.3  CREATININE 1.21  LATICACIDVEN 0.6    Estimated Creatinine Clearance: 51.1 mL/min (by C-G formula based on SCr of 1.21 mg/dL).    Allergies  Allergen Reactions   Codeine Nausea Only    dizzy    Antimicrobials this admission: 4/1 Ceftriaxone x 1  Dose adjustments this admission: NA  Microbiology results: No cultures collected at this time   Thank you for allowing pharmacy to be a part of this patient's care.  Effie Shy, PharmD Pharmacy Resident  12/10/2023 8:05 PM

## 2023-12-10 NOTE — Progress Notes (Signed)
 Labs canceled - pt is going to the hospital to be admitted instead.

## 2023-12-10 NOTE — Telephone Encounter (Signed)
 Patient's wife states that his pus is serosanguinous in nature. She endorses that there was significant swelling that has subsided mostly.   She states that she had been feeling the swelling with her fingers. I educated patient's wife on proper hand hygiene before and after touching the wound.  I told her I would follow up with Nehemiah Settle PA about next steps.

## 2023-12-10 NOTE — Telephone Encounter (Signed)
 Patient experienced a fever of 104 with no chills about 1-2 weeks ago. She did not call us when this initially occurred as his fever dissipated. We later found out about the fever during a nurse visit on 11/29/2023 when the patient came in for adjustments to his spinal cord stimulator. He was initially treated with antibiotics.   She denies that he has a fever at this time, noting that his temperature was 97.5 degrees farenheit.   She said he's having pain in his other incision, not pictured by the patient and his wife.   Spoke with [place] PA who ordered labs, antibiotics and red flag symptoms that would indicate the need to go to the ER.   Patient wife educated about red flag symptoms such as new fevers, chills, and neurological changes such as altered levels of consciousness, confusion, vision changes and new headaches.

## 2023-12-10 NOTE — Telephone Encounter (Signed)
 Per discussion with Manning Charity, PA-C, Joan Flores, PA-C, Dr Lucy Chris, and Dr Ernestine Mcmurray, the patient will go to the ER to be admitted. Dr Adriana Simas will take him to the OR tomorrow to remove the stimulator.  I have notified [facility] to cancel the rx for antibiotics. Outpatient lab orders have been canceled (they will be done in the ER instead).   I have notified Mrs Jamicheal Heard of the change in plans and requested that they proceed to the ER as soon as possible.   At the time of my call (1:05pm) Mr Fugitt has not taken his jardiance today. I have advised to hold this medication because he will be going under anesthesia tomorrow. Mrs Packett verbalized understanding.  I have notified the OR of the plans and scheduled his post-op appointments.

## 2023-12-10 NOTE — ED Provider Notes (Signed)
 [facility] Provider Note   Event Date/Time   First MD Initiated Contact with Patient 12/10/23 1813     (approximate) History  Wound Infection  HPI Danny Marquez is a 78 y.o. male with past medical history of recent spinal cord stimulator placement on 10/31/2023 who presents at the request of his neurosurgeon due to a possible wound infection surrounding this stimulator site.  Patient states that he is having significant pain to the superior surgical site however just recently has started having drainage from the inferior surgical site as well.  Patient does endorse 8/10 pain that is nonradiating but has kept him from sleeping flat on his back. ROS: Patient currently denies any vision changes, tinnitus, difficulty speaking, facial droop, sore throat, chest pain, shortness of breath, abdominal pain, nausea/vomiting/diarrhea, dysuria, or weakness/numbness/paresthesias in any extremity   Physical Exam  Triage Vital Signs: ED Triage Vitals  Encounter Vitals Group     BP 12/10/23 1625 (!) 118/93     Systolic BP Percentile --      Diastolic BP Percentile --      Pulse Rate 12/10/23 1625 78     Resp 12/10/23 1625 18     Temp 12/10/23 1625 98.3 F (36.8 C)     Temp Source 12/10/23 1625 Oral     SpO2 12/10/23 1625 96 %     Weight 12/10/23 1623 182 lb (82.6 kg)     Height 12/10/23 1623 5\' 9"  (1.753 m)     Head Circumference --      Peak Flow --      Pain Score 12/10/23 1623 8     Pain Loc --      Pain Education --      Exclude from Growth Chart --    Most recent vital signs: Vitals:   12/10/23 1930 12/10/23 2000  BP: (!) 91/55 (!) 87/64  Pulse: (!) 58 60  Resp:    Temp:    SpO2: 95% 95%   General: Awake, oriented x4. CV:  Good peripheral perfusion.  Resp:  Normal effort.  Abd:  No distention.  Other:  Elderly overweight Caucasian male resting comfortably in no acute distress.  Midline surgical wound with erythema and purulent drainage ED Results /  Procedures / Treatments  Labs (all labs ordered are listed, but only abnormal results are displayed) Labs Reviewed  BASIC METABOLIC PANEL WITH GFR - Abnormal; Notable for the following components:      Result Value   Glucose, Bld 101 (*)    All other components within normal limits  CBC WITH DIFFERENTIAL/PLATELET - Abnormal; Notable for the following components:   Eosinophils Absolute 0.6 (*)    All other components within normal limits  LACTIC ACID, PLASMA  BASIC METABOLIC PANEL WITH GFR   RADIOLOGY ED MD interpretation: X-rays of the lumbar and thoracic spine independently interpreted and shows no acute findings -Agree with radiology assessment Official radiology report(s): DG Lumbar Spine 2-3 Views Result Date: 12/10/2023 CLINICAL DATA:  Spinal stimulator, low back pain EXAM: LUMBAR SPINE - 2-3 VIEW COMPARISON:  09/27/2022 FINDINGS: Post instrumented fusion L4-S1. Stable hardware across the right sacroiliac joint. Implanted generator is noted, cephalad extent of lead not visualized on this study. No fracture or dislocation. Mild narrowing of interspaces L2-L4. Aortic Atherosclerosis (ICD10-170.0). IMPRESSION: 1. No acute findings. 2. Post   fusion L4-S1. Electronically Signed   By: Corlis Leak M.D.   On: 12/10/2023 18:11   DG Thoracic Spine 2 View Result  Date: 12/10/2023 CLINICAL DATA:  [id number] Pre-op exam [id number] EXAM: THORACIC SPINE 2 VIEWS COMPARISON:  10/31/2023 FINDINGS: Mild thoracic kyphosis. No fracture or dislocation. Dorsal stimulator catheters extend up to the T8 level. Sternotomy wires. AICD leads partially visualized. IMPRESSION: 1. No acute findings. 2. Dorsal stimulator catheters. Electronically Signed   By: Corlis Leak M.D.   On: 12/10/2023 18:09   PROCEDURES: Critical Care performed: No Procedures MEDICATIONS ORDERED IN ED: Medications  acetaminophen (TYLENOL) tablet 650 mg (has no administration in time range)    Or  acetaminophen (TYLENOL) suppository 650 mg (has no  administration in time range)  oxyCODONE (Oxy IR/ROXICODONE) immediate release tablet 5 mg (has no administration in time range)  morphine (PF) 2 MG/ML injection 2 mg (has no administration in time range)  ondansetron (ZOFRAN) tablet 4 mg (has no administration in time range)    Or  ondansetron (ZOFRAN) injection 4 mg (has no administration in time range)  albuterol (PROVENTIL) (2.5 MG/3ML) 0.083% nebulizer solution 2.5 mg (has no administration in time range)  vancomycin (VANCOREADY) IVPB 1500 mg/300 mL (has no administration in time range)  cefTRIAXone (ROCEPHIN) 2 g in sodium chloride 0.9 % 100 mL IVPB (0 g Intravenous Stopped 12/10/23 2036)  vancomycin (VANCOREADY) IVPB 2000 mg/400 mL (2,000 mg Intravenous New Bag/Given 12/10/23 2049)   IMPRESSION / MDM / ASSESSMENT AND PLAN / ED COURSE  I reviewed the triage vital signs and the nursing notes.                             The patient is on the cardiac monitor to evaluate for evidence of arrhythmia and/or significant heart rate changes. Patient's presentation is most consistent with acute presentation with potential threat to life or bodily function. Patient is a 78 year old male with the above-stated past medical history that presents at the request of his neurosurgeon for continued pain control, antibiotics, and plan for removal of a spinal stimulator tomorrow morning.  Patient shows no signs of sepsis at this time with normal white blood cell count and normal lactic acid.  Patient will be started on empiric antibiotics for skin flora coverage and admission to the internal medicine service. Dispo, admit to medicine   FINAL CLINICAL IMPRESSION(S) / ED DIAGNOSES   Final diagnoses:  Wound infection   Rx / DC Orders   ED Discharge Orders     None      Note:  This document was prepared using Dragon voice recognition software and may include unintentional dictation errors.   Merwyn Katos, MD 12/10/23 [phone]

## 2023-12-10 NOTE — Addendum Note (Signed)
 Addended by: Joan Flores on: 12/10/2023 12:38 PM   Modules accepted: Orders

## 2023-12-11 ENCOUNTER — Encounter: Payer: Self-pay | Admitting: Internal Medicine

## 2023-12-11 ENCOUNTER — Other Ambulatory Visit: Payer: Self-pay

## 2023-12-11 ENCOUNTER — Inpatient Hospital Stay: Admitting: Anesthesiology

## 2023-12-11 ENCOUNTER — Inpatient Hospital Stay: Admission: RE | Admit: 2023-12-11 | Source: Home / Self Care | Admitting: Neurosurgery

## 2023-12-11 ENCOUNTER — Encounter
Admission: EM | Disposition: A | Discharge status: Home / Self Care | Payer: Self-pay | Source: Ambulatory Visit | Attending: Internal Medicine

## 2023-12-11 DIAGNOSIS — F03A0 Unspecified dementia, mild, without behavioral disturbance, psychotic disturbance, mood disturbance, and anxiety: Secondary | ICD-10-CM | POA: Insufficient documentation

## 2023-12-11 DIAGNOSIS — Z905 Acquired absence of kidney: Secondary | ICD-10-CM

## 2023-12-11 DIAGNOSIS — B9561 Methicillin susceptible Staphylococcus aureus infection as the cause of diseases classified elsewhere: Secondary | ICD-10-CM

## 2023-12-11 DIAGNOSIS — I5022 Chronic systolic (congestive) heart failure: Secondary | ICD-10-CM

## 2023-12-11 DIAGNOSIS — I4821 Permanent atrial fibrillation: Secondary | ICD-10-CM

## 2023-12-11 DIAGNOSIS — E871 Hypo-osmolality and hyponatremia: Secondary | ICD-10-CM | POA: Insufficient documentation

## 2023-12-11 DIAGNOSIS — Q600 Renal agenesis, unilateral: Secondary | ICD-10-CM

## 2023-12-11 DIAGNOSIS — T8149XA Infection following a procedure, other surgical site, initial encounter: Secondary | ICD-10-CM | POA: Diagnosis not present

## 2023-12-11 LAB — BASIC METABOLIC PANEL WITH GFR
Anion gap: 8 (ref 5–15)
BUN: 12 mg/dL (ref 8–23)
CO2: 22 mmol/L (ref 22–32)
Calcium: 8.8 mg/dL — ABNORMAL LOW (ref 8.9–10.3)
Chloride: 102 mmol/L (ref 98–111)
Creatinine, Ser: 1.07 mg/dL (ref 0.61–1.24)
GFR, Estimated: >60 mL/min (ref >60)
Glucose, Bld: 91 mg/dL (ref 70–99)
Potassium: 4 mmol/L (ref 3.5–5.1)
Sodium: 132 mmol/L — ABNORMAL LOW (ref 135–145)

## 2023-12-11 SURGERY — REMOVAL, SPINAL CORD STIMULATOR, THORACIC
Anesthesia: General

## 2023-12-11 MED ORDER — PROPOFOL 10 MG/ML IV BOLUS
INTRAVENOUS | Status: AC
  Filled 2023-12-11: qty 20

## 2023-12-11 MED ORDER — FENTANYL CITRATE (PF) 100 MCG/2ML IJ SOLN
INTRAMUSCULAR | Status: AC
  Filled 2023-12-11: qty 2

## 2023-12-11 MED ORDER — ONDANSETRON HCL 4 MG/2ML IJ SOLN
INTRAMUSCULAR | Status: AC
  Filled 2023-12-11: qty 2

## 2023-12-11 MED ORDER — PANTOPRAZOLE SODIUM 20 MG PO TBEC
20.0000 mg | DELAYED_RELEASE_TABLET | Freq: Every day | ORAL | Status: DC
  Administered 2023-12-12 – 2023-12-14 (×3): 20 mg via ORAL
  Filled 2023-12-11 (×3): qty 1

## 2023-12-11 MED ORDER — ROCURONIUM BROMIDE 10 MG/ML (PF) SYRINGE
PREFILLED_SYRINGE | INTRAVENOUS | Status: AC
  Filled 2023-12-11: qty 10

## 2023-12-11 MED ORDER — ASPIRIN 81 MG PO TBEC
81.0000 mg | DELAYED_RELEASE_TABLET | Freq: Every day | ORAL | Status: DC
  Administered 2023-12-12 – 2023-12-13 (×2): 81 mg via ORAL
  Filled 2023-12-11 (×2): qty 1

## 2023-12-11 MED ORDER — LIDOCAINE HCL (PF) 2 % IJ SOLN
INTRAMUSCULAR | Status: AC
  Filled 2023-12-11: qty 5

## 2023-12-11 MED ORDER — 0.9 % SODIUM CHLORIDE (POUR BTL) OPTIME
TOPICAL | Status: DC | PRN
  Administered 2023-12-11: 500 mL

## 2023-12-11 MED ORDER — LIDOCAINE HCL (CARDIAC) PF 100 MG/5ML IV SOSY
PREFILLED_SYRINGE | INTRAVENOUS | Status: DC | PRN
Start: 2023-12-11 — End: 2023-12-11
  Administered 2023-12-11: 100 mg via INTRAVENOUS

## 2023-12-11 MED ORDER — SODIUM CHLORIDE 0.9 % IV SOLN: INTRAVENOUS | Status: DC | PRN

## 2023-12-11 MED ORDER — ACETAMINOPHEN 10 MG/ML IV SOLN
INTRAVENOUS | Status: DC | PRN
  Administered 2023-12-11: 1000 mg via INTRAVENOUS

## 2023-12-11 MED ORDER — VANCOMYCIN HCL 1500 MG/300ML IV SOLN
1500.0000 mg | INTRAVENOUS | Status: DC
  Administered 2023-12-11 – 2023-12-13 (×3): 1500 mg via INTRAVENOUS
  Filled 2023-12-11 (×3): qty 300

## 2023-12-11 MED ORDER — PIPERACILLIN-TAZOBACTAM 3.375 G IVPB
3.3750 g | Freq: Three times a day (TID) | INTRAVENOUS | Status: AC
  Administered 2023-12-11 (×2): 3.375 g via INTRAVENOUS
  Filled 2023-12-11 (×2): qty 50

## 2023-12-11 MED ORDER — ENOXAPARIN SODIUM 40 MG/0.4ML IJ SOSY
40.0000 mg | PREFILLED_SYRINGE | INTRAMUSCULAR | Status: DC
  Administered 2023-12-12 – 2023-12-14 (×3): 40 mg via SUBCUTANEOUS
  Filled 2023-12-11 (×3): qty 0.4

## 2023-12-11 MED ORDER — ETOMIDATE 2 MG/ML IV SOLN
INTRAVENOUS | Status: AC
  Filled 2023-12-11: qty 10

## 2023-12-11 MED ORDER — MIDAZOLAM HCL 2 MG/2ML IJ SOLN
INTRAMUSCULAR | Status: DC | PRN
Start: 2023-12-11 — End: 2023-12-11
  Administered 2023-12-11: 2 mg via INTRAVENOUS

## 2023-12-11 MED ORDER — ROSUVASTATIN CALCIUM 5 MG PO TABS
5.0000 mg | ORAL_TABLET | Freq: Every day | ORAL | Status: DC
  Administered 2023-12-11 – 2023-12-13 (×3): 5 mg via ORAL
  Filled 2023-12-11 (×3): qty 1

## 2023-12-11 MED ORDER — VANCOMYCIN HCL 1000 MG IV SOLR
INTRAVENOUS | Status: AC
  Filled 2023-12-11: qty 20

## 2023-12-11 MED ORDER — ROCURONIUM BROMIDE 100 MG/10ML IV SOLN
INTRAVENOUS | Status: DC | PRN
Start: 2023-12-11 — End: 2023-12-11
  Administered 2023-12-11: 40 mg via INTRAVENOUS

## 2023-12-11 MED ORDER — FENTANYL CITRATE (PF) 100 MCG/2ML IJ SOLN
INTRAMUSCULAR | Status: DC | PRN
  Administered 2023-12-11 (×2): 50 ug via INTRAVENOUS

## 2023-12-11 MED ORDER — FENTANYL CITRATE (PF) 100 MCG/2ML IJ SOLN
25.0000 ug | INTRAMUSCULAR | Status: DC | PRN
  Administered 2023-12-11 (×2): 25 ug via INTRAVENOUS

## 2023-12-11 MED ORDER — SODIUM CHLORIDE 0.9 % IV SOLN
2.0000 g | Freq: Two times a day (BID) | INTRAVENOUS | Status: DC
  Administered 2023-12-11 – 2023-12-13 (×4): 2 g via INTRAVENOUS
  Filled 2023-12-11 (×5): qty 12.5

## 2023-12-11 MED ORDER — SERTRALINE HCL 50 MG PO TABS
100.0000 mg | ORAL_TABLET | Freq: Every day | ORAL | Status: DC
  Administered 2023-12-12 – 2023-12-14 (×3): 100 mg via ORAL
  Filled 2023-12-11 (×3): qty 2

## 2023-12-11 MED ORDER — DROPERIDOL 2.5 MG/ML IJ SOLN: 0.6250 mg | Freq: Once | INTRAMUSCULAR | Status: DC | PRN

## 2023-12-11 MED ORDER — TRAZODONE HCL 50 MG PO TABS
75.0000 mg | ORAL_TABLET | Freq: Every day | ORAL | Status: DC
  Administered 2023-12-11 – 2023-12-12 (×2): 75 mg via ORAL
  Filled 2023-12-11 (×2): qty 2

## 2023-12-11 MED ORDER — IRRISEPT - 450ML BOTTLE WITH 0.05% CHG IN STERILE WATER, USP 99.95% OPTIME
TOPICAL | Status: DC | PRN
  Administered 2023-12-11: 450 mL

## 2023-12-11 MED ORDER — METOPROLOL SUCCINATE ER 25 MG PO TB24
12.5000 mg | ORAL_TABLET | Freq: Every day | ORAL | Status: DC
  Administered 2023-12-12 – 2023-12-14 (×3): 12.5 mg via ORAL
  Filled 2023-12-11 (×3): qty 1

## 2023-12-11 MED ORDER — PROPOFOL 10 MG/ML IV BOLUS
INTRAVENOUS | Status: DC | PRN
  Administered 2023-12-11: 80 mg via INTRAVENOUS

## 2023-12-11 MED ORDER — DEXAMETHASONE SODIUM PHOSPHATE 10 MG/ML IJ SOLN
INTRAMUSCULAR | Status: AC
  Filled 2023-12-11: qty 1

## 2023-12-11 MED ORDER — EPHEDRINE 5 MG/ML INJ
INTRAVENOUS | Status: AC
  Filled 2023-12-11: qty 5

## 2023-12-11 MED ORDER — LOSARTAN POTASSIUM 25 MG PO TABS
12.5000 mg | ORAL_TABLET | Freq: Every day | ORAL | Status: DC
  Administered 2023-12-12 – 2023-12-14 (×3): 12.5 mg via ORAL
  Filled 2023-12-11 (×3): qty 1

## 2023-12-11 MED ORDER — MIDAZOLAM HCL 2 MG/2ML IJ SOLN
INTRAMUSCULAR | Status: AC
  Filled 2023-12-11: qty 2

## 2023-12-11 MED ORDER — LACTATED RINGERS IV SOLN: INTRAVENOUS | Status: DC | PRN

## 2023-12-11 MED ORDER — MELATONIN 5 MG PO TABS
5.0000 mg | ORAL_TABLET | Freq: Every day | ORAL | Status: DC
  Administered 2023-12-11 – 2023-12-13 (×4): 5 mg via ORAL
  Filled 2023-12-11 (×4): qty 1

## 2023-12-11 MED ORDER — BUPIVACAINE-EPINEPHRINE (PF) 0.5% -1:200000 IJ SOLN
INTRAMUSCULAR | Status: AC
Start: 2023-12-11 — End: ?
  Filled 2023-12-11: qty 30

## 2023-12-11 MED ORDER — DONEPEZIL HCL 5 MG PO TABS
10.0000 mg | ORAL_TABLET | Freq: Every day | ORAL | Status: DC
  Administered 2023-12-11 – 2023-12-13 (×3): 10 mg via ORAL
  Filled 2023-12-11 (×3): qty 2

## 2023-12-11 MED ORDER — SUGAMMADEX SODIUM 200 MG/2ML IV SOLN
INTRAVENOUS | Status: AC
  Filled 2023-12-11: qty 2

## 2023-12-11 MED ORDER — PHENYLEPHRINE 80 MCG/ML (10ML) SYRINGE FOR IV PUSH (FOR BLOOD PRESSURE SUPPORT)
PREFILLED_SYRINGE | INTRAVENOUS | Status: AC
  Filled 2023-12-11: qty 10

## 2023-12-11 MED ORDER — SUGAMMADEX SODIUM 200 MG/2ML IV SOLN
INTRAVENOUS | Status: DC | PRN
  Administered 2023-12-11: 200 mg via INTRAVENOUS

## 2023-12-11 MED ORDER — RANOLAZINE ER 500 MG PO TB12
500.0000 mg | ORAL_TABLET | Freq: Two times a day (BID) | ORAL | Status: DC
  Administered 2023-12-11 – 2023-12-14 (×6): 500 mg via ORAL
  Filled 2023-12-11 (×8): qty 1

## 2023-12-11 MED ORDER — ACETAMINOPHEN 10 MG/ML IV SOLN
INTRAVENOUS | Status: AC
  Filled 2023-12-11: qty 100

## 2023-12-11 MED ORDER — ETOMIDATE 2 MG/ML IV SOLN
INTRAVENOUS | Status: DC | PRN
  Administered 2023-12-11: 8 mg via INTRAVENOUS

## 2023-12-11 MED ORDER — ALLOPURINOL 100 MG PO TABS
200.0000 mg | ORAL_TABLET | Freq: Every day | ORAL | Status: DC
  Administered 2023-12-12 – 2023-12-14 (×3): 200 mg via ORAL
  Filled 2023-12-11 (×3): qty 2

## 2023-12-11 MED ORDER — BUPIVACAINE-EPINEPHRINE (PF) 0.5% -1:200000 IJ SOLN
INTRAMUSCULAR | Status: DC | PRN
  Administered 2023-12-11: 5 mL via PERINEURAL

## 2023-12-11 MED ORDER — DEXAMETHASONE SODIUM PHOSPHATE 10 MG/ML IJ SOLN
INTRAMUSCULAR | Status: DC | PRN
Start: 2023-12-11 — End: 2023-12-11
  Administered 2023-12-11: 8 mg via INTRAVENOUS

## 2023-12-11 SURGICAL SUPPLY — 66 items
BLADE BOVIE TIP EXT 4 (BLADE) ×1 IMPLANT
BUR NEURO DRILL SOFT 3.0X3.8M (BURR) IMPLANT
CHLORAPREP W/TINT 26 (MISCELLANEOUS) ×2 IMPLANT
CNTNR URN SCR LID CUP LEK RST (MISCELLANEOUS) ×1 IMPLANT
COUNTER NDL MAGNETIC 40 RED (SET/KITS/TRAYS/PACK) ×2 IMPLANT
COUNTER NEEDLE MAGNETIC 40 RED (SET/KITS/TRAYS/PACK) ×2 IMPLANT
COVER LIGHT HANDLE STERIS (MISCELLANEOUS) ×2 IMPLANT
CUP MEDICINE 2OZ PLAST GRAD ST (MISCELLANEOUS) ×2 IMPLANT
DERMABOND ADVANCED .7 DNX12 (GAUZE/BANDAGES/DRESSINGS) ×1 IMPLANT
DRAPE C-ARM 42X72 X-RAY (DRAPES) ×2 IMPLANT
DRAPE C-ARMOR (DRAPES) IMPLANT
DRAPE LAPAROTOMY 100X77 ABD (DRAPES) ×1 IMPLANT
DRAPE SURG 17X11 SM STRL (DRAPES) ×1 IMPLANT
DRSG OPSITE POSTOP 3X4 (GAUZE/BANDAGES/DRESSINGS) IMPLANT
DRSG OPSITE POSTOP 4X6 (GAUZE/BANDAGES/DRESSINGS) IMPLANT
DURASEAL APPLICATOR TIP (TIP) IMPLANT
DURASEAL SPINE SEALANT 3ML (MISCELLANEOUS) IMPLANT
ELECT CAUTERY BLADE TIP 2.5 (TIP) ×2 IMPLANT
ELECT EZSTD 165MM 6.5IN (MISCELLANEOUS) ×1 IMPLANT
ELECT REM PT RETURN 9FT ADLT (ELECTROSURGICAL) ×1 IMPLANT
ELECTRODE CAUTERY BLDE TIP 2.5 (TIP) ×2 IMPLANT
ELECTRODE EZSTD 165MM 6.5IN (MISCELLANEOUS) ×1 IMPLANT
ELECTRODE REM PT RTRN 9FT ADLT (ELECTROSURGICAL) ×1 IMPLANT
FEE INTRAOP CADWELL SUPPLY NCS (MISCELLANEOUS) ×1 IMPLANT
FEE INTRAOP MONITOR IMPULS NCS (MISCELLANEOUS) IMPLANT
GAUZE 4X4 16PLY ~~LOC~~+RFID DBL (SPONGE) ×1 IMPLANT
GAUZE SPONGE 4X4 12PLY STRL (GAUZE/BANDAGES/DRESSINGS) IMPLANT
GLOVE BIOGEL PI IND STRL 6.5 (GLOVE) ×2 IMPLANT
GLOVE BIOGEL PI IND STRL 7.0 (GLOVE) ×1 IMPLANT
GLOVE BIOGEL PI IND STRL 8 (GLOVE) ×1 IMPLANT
GLOVE SURG SYN 6.5 ES PF (GLOVE) ×2 IMPLANT
GLOVE SURG SYN 6.5 PF PI (GLOVE) ×2 IMPLANT
GLOVE SURG SYN 7.0 (GLOVE) ×2 IMPLANT
GLOVE SURG SYN 7.0 PF PI (GLOVE) ×2 IMPLANT
GLOVE SURG SYN 8.0 (GLOVE) ×1 IMPLANT
GLOVE SURG SYN 8.0 PF PI (GLOVE) ×1 IMPLANT
GOWN SRG LRG LVL 4 IMPRV REINF (GOWNS) ×2 IMPLANT
GOWN STRL REUS W/ TWL LRG LVL3 (GOWN DISPOSABLE) ×1 IMPLANT
GOWN STRL REUS W/ TWL XL LVL3 (GOWN DISPOSABLE) ×2 IMPLANT
GRADUATE 1200CC STRL 31836 (MISCELLANEOUS) ×2 IMPLANT
GRAFT DURAGEN MATRIX 1WX1L (Tissue) IMPLANT
INTRAOP CADWELL SUPPLY FEE NCS (MISCELLANEOUS) IMPLANT
INTRAOP MONITOR FEE IMPULS NCS (MISCELLANEOUS) IMPLANT
JET LAVAGE IRRISEPT WOUND (IRRIGATION / IRRIGATOR) ×1 IMPLANT
KIT TURNOVER KIT A (KITS) ×1 IMPLANT
LAVAGE JET IRRISEPT WOUND (IRRIGATION / IRRIGATOR) IMPLANT
MANIFOLD NEPTUNE II (INSTRUMENTS) ×1 IMPLANT
MARKER SKIN DUAL TIP RULER LAB (MISCELLANEOUS) ×3 IMPLANT
NDL SAFETY ECLIPSE 18X1.5 (NEEDLE) ×1 IMPLANT
NS IRRIG 1000ML POUR BTL (IV SOLUTION) ×1 IMPLANT
PACK LAMINECTOMY ARMC (PACKS) ×1 IMPLANT
PAD ARMBOARD POSITIONER FOAM (MISCELLANEOUS) ×2 IMPLANT
STAPLER SKIN PROX 35W (STAPLE) IMPLANT
SURGIFLO W/THROMBIN 8M KIT (HEMOSTASIS) IMPLANT
SUT ETHILON 3-0 FS-10 30 BLK (SUTURE) ×4 IMPLANT
SUT POLYSORB 2-0 5X18 GS-10 (SUTURE) ×2 IMPLANT
SUT SILK 2 0 SH (SUTURE) ×2 IMPLANT
SUT VIC AB 0 CT1 18XCR BRD 8 (SUTURE) ×2 IMPLANT
SUTURE EHLN 3-0 FS-10 30 BLK (SUTURE) ×2 IMPLANT
SYR 10ML LL (SYRINGE) ×3 IMPLANT
SYR 30ML LL (SYRINGE) ×2 IMPLANT
SYR 3ML LL SCALE MARK (SYRINGE) ×1 IMPLANT
TOWEL OR 17X26 4PK STRL BLUE (TOWEL DISPOSABLE) ×3 IMPLANT
TRAP FLUID SMOKE EVACUATOR (MISCELLANEOUS) ×1 IMPLANT
TUBING CONNECTING 10 (TUBING) ×2 IMPLANT
WATER STERILE IRR 500ML POUR (IV SOLUTION) ×1 IMPLANT

## 2023-12-11 NOTE — ED Notes (Signed)
 Report given to peri-op staff. Patient then changed into gown and consent signed prior to transport to OR.

## 2023-12-11 NOTE — Progress Notes (Signed)
  Progress Note   Patient: Danny Marquez ZOX:[id number] DOB: [birth year] DOA: 12/10/2023     1 DOS: the patient was seen and examined on 12/11/2023   Brief hospital course: Danny Marquez is a 78 y.o. 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, HTN, solitary kidney remote history of alcohol use disorder, failed back syndrome referred from the [place] to [facility] health where he underwent laminectomy and placement of spinal stimulator on 10/2023, complicated by postoperative infection a couple weeks prior for which he completed a course of Keflex and last seen by his orthopedist on 3/26 when his incision appeared improved in appearance, who was sent in by neurosurgery due to concern for postoperative infection with plans for spinal stimulator removal and washout    Principal Problem:   Surgical wound infection Active Problems:   Atrial fibrillation (HCC)   Chronic systolic congestive heart failure (HCC)   Ischemic cardiomyopathy   Hypertension   History of coronary artery bypass surgery   Mild dementia (HCC)   Solitary kidney   Assessment and Plan: * Surgical wound infection S/p laminectomy and spinal stimulator 10/2023 Chronic back pain Went to surgery today with removal of spinal cord stimulator, wound culture has sent out.  Continue vancomycin and Zosyn.  ID consult obtained.  Solitary kidney Creatinine normal  Mild dementia (HCC) Delirium precautions Continue Aricept  History of coronary artery bypass surgery Holding metoprolol due to low blood pressure Continue aspirin, Crestor  Hypertension Continue to follow.  Chronic systolic congestive heart failure (HCC) Ischemic cardiomyopathy s/p AICD Holding losartan and metoprolol torsemide due to low blood pressures Daily weights with intake and output monitoring Last echo on file is from 04/2022 showing EF over 55%, likely HFrEF with improved  EF Patient has been seen by cardiology, cleared for surgery.  No exacerbation of congestive heart failure.   Chronic atrial fibrillation (HCC) Rate controlled.       Subjective:  Patient doing well, not short of breath.  Physical Exam: Vitals:   12/11/23 1332 12/11/23 1345 12/11/23 1400 12/11/23 1415  BP:  122/60 113/60 113/61  Pulse: 60 (!) 58 (!) 32 (!) 55  Resp: 14 14 (!) 23 17  Temp:    (!) 97.2 F (36.2 C)  TempSrc:      SpO2: 90% 91% 92% 93%  Weight:      Height:       General exam: Appears calm and comfortable  Respiratory system: Clear to auscultation. Respiratory effort normal. Cardiovascular system: Irregular.  No JVD, murmurs, rubs, gallops or clicks. No pedal edema. Gastrointestinal system: Abdomen is nondistended, soft and nontender. No organomegaly or masses felt. Normal bowel sounds heard. Central nervous system: Alert and oriented. No focal neurological deficits. Extremities: Symmetric 5 x 5 power. Skin: No rashes, lesions or ulcers Psychiatry: Judgement and insight appear normal. Mood & affect appropriate.    Data Reviewed:  Review lab results.  Family Communication: Wife Updated at bedside.  Disposition: Status is: Inpatient Remains inpatient appropriate because: Severity of disease, IV treatment.     Time spent: 35 minutes  Author: Marrion Coy, MD 12/11/2023 3:10 PM  For on call review [email or web address].

## 2023-12-11 NOTE — Interval H&P Note (Signed)
 Cardiovascular exam includes normal regular rate and rhythm, clear to auscultation   History and Physical Interval Note:  12/11/2023 11:07 AM  Danny Marquez  has presented today for surgery, with the diagnosis of T81.49XA  Infected Surgical Wound.  The various methods of treatment have been discussed with the patient and family. After consideration of risks, benefits and other options for treatment, the patient has consented to  Procedure(s): REMOVAL, SPINAL CORD STIMULATOR, THORACIC (N/A) as a surgical intervention.  The patient's history has been reviewed, patient examined, no change in status, stable for surgery.  I have reviewed the patient's chart and labs.  Questions were answered to the patient's satisfaction.     Lucy Chris

## 2023-12-11 NOTE — Consult Note (Signed)
 Neurosurgery-New Consultation Evaluation 12/11/2023 Salik Grewell [id number]  Identifying Statement: Danny Marquez is a 78 y.o. male from [place] [id number] with recent spinal cord stimulator placement  Physician Requesting Consultation: [facility]  History of Present Illness: Danny Marquez is now 6 weeks out from his spinal cord stimulator placement.  He was seen in clinic previously had concern for possible poor wound healing and was treated for possible superficial infection.  Unfortunately, this has continued to have drainage and he presents to the ED with concern for infection.  He is not endorse any new symptoms in his legs and he does feel a similar was helping with some of the pain.  He does feel like the incision sites are tender and warm.  He is did  Past Medical History:  Past Medical History:  Diagnosis Date   Anemia    Aortic atherosclerosis (HCC)    ASD (atrial septal defect) 04/30/2022   a.) TTE 04/30/2022: trivial iatrogenic ASD   Atrial fibrillation (HCC)    a.) CHA2DS2VASc = 5 (age x2, HFrEF, HTN, prior MI/vascular disease) as of 10/30/2023; b.) s/p DCCV 01/23/2017 (150 x 1); c.) s/p DCCV 02/01/2017; d.) s/p PVI + roof line ablation 01/12/2022; e.) s/p LAA closure (Watchman) 03/19/2022; f.) rate/rhythm maintained on oral metoprolol succinate; not on chronic OAC   B12 deficiency    Benign essential hypertension    Bilateral carotid artery disease (HCC)    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   a.) Medtronic device CRT-D device placed 07/23/2021   CHF (congestive heart failure) (HCC)    a.) MV 04/05/2015: EF 30-35%; b.) TTE 01/08/2017: EF 30%, glob HK, mod LVE, mod BAE, RVSP 50.5, mild PR, mod MR/TR; c.) TTE 09/22/2020: EF 30%, glob HK, mod BAE/BVE, RVSP 48.5, mild PR, mod MR/TR; d.) TTE 04/30/2022: EF >55%, triv iatrogenic ASD, mod MR, triv TR   Chronic low back pain    Chronic pain syndrome    Chronic passive  hepatic congestion    Cirrhosis (HCC)    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)    Coronary artery disease    a.) MI (unk type) 09/2006; b.) s/p 2v CABG 09/13/2006; c.) LHC 01/23/2022: 60% pLAD, 100% mLAD, 100% D1, 100% pLCx, 100% mLCx, 100% OM1, 70% OM2, 80% OM3, 40% pRCA, 99% mRCA, 100% dRCAl LIMA-LAD and SVG-OM1 bypass grafts were patent --> med mgmt; d.) MV 04/05/2015: no ischemia;   Depression    Difficulty walking    Diverticulosis    Failed back surgical syndrome    GERD (gastroesophageal reflux disease)    Gout    Hepatic fibrosis    History of alcohol dependence (HCC)    History of left atrial appendage closure 03/19/2022   a.) 24 mm Watchman FLX device placed 03/19/2022   HTN (hypertension)    Hyperlipidemia    Insomnia    a.) on trazodone PRN   Iron deficiency    Ischemic cardiomyopathy    a.) MV 04/05/2015: EF 30-35%; b.) TTE 01/08/2017: EF 30%; c.) TTE 09/22/2020: EF 30%;; d.) TTE 04/30/2022: EF >55%   LBBB (left bundle branch block)    Mild dementia (HCC)    a.) on acetylcholinesterase inhibitor (donepazil)   MRSA nasal colonization 10/18/2023   a.) presurgical PCR (+) 10/18/2023 prior to THORACIC LAMINECTOMY FOR SPINAL CORD STIMULATOR PLACEMENT   Myocardial infarction (HCC) 09/2006   Neuropathy    NSVT (nonsustained ventricular tachycardia) (HCC)    a.) multiple episodes following CRT-D placement (  below device threshold); b.) s/p VT ablation 05/24/2022 -->  significant epicardial/mid-myocardial portions of circuit noted and procedure was prolonged -> aborted before LV substrate was fully ablated d/t worsening LV function and increased hemodynamic support  needs following 5 administered shocks; c.) staged VT ablation 05/31/2022 - successful   Pancreatitis    PTSD (post-traumatic stress disorder)    Pulmonary hypertension (HCC)    a.) TTE 01/08/2017: RVSP 50.5; b.) TTE 09/22/2020: RVSP 48.5   PVC (premature ventricular contraction)    a.) holter 12/25/2016:  underlying A.fib with variable ventricular response at average rate of 86 bpm (range 49-141 bpm); 2461 single PVCs and 43 PVC couplets   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   a.) LIMA-LAD, SVG-OM1   SNHL (sensorineural hearing loss)    Spinal stenosis of lumbar region    Tremor     Social History: Social History   Socioeconomic History   Marital status: Married    Spouse name: Angelique Blonder   Number of children: 2   Years of education: Not on file   Highest education level: Not on file  Occupational History   Not on file  Tobacco Use   Smoking status: Former    Current packs/day: 0.00    Types: Cigarettes    Quit date: 10/07/2003    Years since quitting: 20.1   Smokeless tobacco: Never  Vaping Use   Vaping status: Never Used  Substance and Sexual Activity   Alcohol use: Not Currently    Comment: NONE SINCE 2018   Drug use: Not Currently   Sexual activity: Not on file  Other Topics Concern   Not on file  Social History Narrative   Not on file   Social Drivers of Health   Financial Resource Strain: Not on file  Food Insecurity: Not on file  Transportation Needs: No Transportation Needs (05/28/2022)   Received from [facility], [facility]   PRAPARE - Transportation    In the past 12 months, has lack of transportation kept you from medical appointments or from getting medications?: No    Lack of Transportation (Non-Medical): No  Physical Activity: Not on file  Stress: Not on file  Social Connections: Not on file  Intimate Partner Violence: Not on file    Family History: Family History  Problem Relation Age of Onset   Heart attack Father     Review of Systems:  Review of Systems - General ROS: Negative Psychological ROS: Negative Ophthalmic ROS: Negative ENT ROS: Negative Hematological and Lymphatic ROS: Negative  Endocrine ROS: Negative Respiratory ROS: Negative Cardiovascular ROS: Negative Gastrointestinal ROS: Negative Genito-Urinary  ROS: Negative Musculoskeletal ROS: Positive for back pain Neurological ROS: Negative leg pain or numbness Dermatological ROS: Negative  Physical Exam: BP 128/63   Pulse (!) 59   Temp 97.8 F (36.6 C) (Temporal)   Resp 14   Ht 5\' 9"  (1.753 m)   Wt 82.6 kg   SpO2 95%   BMI 26.88 kg/m  Body mass index is 26.88 kg/m. Body surface area is 2.01 meters squared. General appearance: Alert, cooperative, in no acute distress Head: Normocephalic, atraumatic Eyes: Normal, EOM intact Oropharynx: Moist without lesions Back: Midline thoracic vision is warm and erythematous, left flank incision is open with drainage Ext: No edema in LE bilaterally  Neurologic exam:  Mental status: alertness: alert, affect: normal Speech: fluent and clear Motor:strength symmetric 5/5 in bilateral lower extremities Sensory: intact to light touch in bilateral lower extremities Gait: Not tested  Imaging: Thoracic spine x-rays: Stable placement of the spinal cord stimulator   Impression/Plan:  Danny Marquez is here for concern for ongoing infection with prior spinal cord similar placement.  Given the appearance of the wounds, I do think I&D is needed and we discussed that given the hardware, this would need to be removed.  We will obtain cultures and continue antibiotics.   1.  Diagnosis: Postoperative wound infection  2.  Plan Proceed to the OR today for I&D of the wounds and removal of hardware  Lucy Chris, MD

## 2023-12-11 NOTE — H&P (View-Only) (Signed)
 Neurosurgery-New Consultation Evaluation 12/11/2023 Danny Marquez [id number]  Identifying Statement: Melesio Madara is a 78 y.o. male from [place] [id number] with recent spinal cord stimulator placement  Physician Requesting Consultation: [facility]  History of Present Illness: Danny Marquez is now 6 weeks out from his spinal cord stimulator placement.  He was seen in clinic previously had concern for possible poor wound healing and was treated for possible superficial infection.  Unfortunately, this has continued to have drainage and he presents to the ED with concern for infection.  He is not endorse any new symptoms in his legs and he does feel a similar was helping with some of the pain.  He does feel like the incision sites are tender and warm.  He is did  Past Medical History:  Past Medical History:  Diagnosis Date   Anemia    Aortic atherosclerosis (HCC)    ASD (atrial septal defect) 04/30/2022   a.) TTE 04/30/2022: trivial iatrogenic ASD   Atrial fibrillation (HCC)    a.) CHA2DS2VASc = 5 (age x2, HFrEF, HTN, prior MI/vascular disease) as of 10/30/2023; b.) s/p DCCV 01/23/2017 (150 x 1); c.) s/p DCCV 02/01/2017; d.) s/p PVI + roof line ablation 01/12/2022; e.) s/p LAA closure (Watchman) 03/19/2022; f.) rate/rhythm maintained on oral metoprolol succinate; not on chronic OAC   B12 deficiency    Benign essential hypertension    Bilateral carotid artery disease (HCC)    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   a.) Medtronic device CRT-D device placed 07/23/2021   CHF (congestive heart failure) (HCC)    a.) MV 04/05/2015: EF 30-35%; b.) TTE 01/08/2017: EF 30%, glob HK, mod LVE, mod BAE, RVSP 50.5, mild PR, mod MR/TR; c.) TTE 09/22/2020: EF 30%, glob HK, mod BAE/BVE, RVSP 48.5, mild PR, mod MR/TR; d.) TTE 04/30/2022: EF >55%, triv iatrogenic ASD, mod MR, triv TR   Chronic low back pain    Chronic pain syndrome    Chronic passive  hepatic congestion    Cirrhosis (HCC)    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)    Coronary artery disease    a.) MI (unk type) 09/2006; b.) s/p 2v CABG 09/13/2006; c.) LHC 01/23/2022: 60% pLAD, 100% mLAD, 100% D1, 100% pLCx, 100% mLCx, 100% OM1, 70% OM2, 80% OM3, 40% pRCA, 99% mRCA, 100% dRCAl LIMA-LAD and SVG-OM1 bypass grafts were patent --> med mgmt; d.) MV 04/05/2015: no ischemia;   Depression    Difficulty walking    Diverticulosis    Failed back surgical syndrome    GERD (gastroesophageal reflux disease)    Gout    Hepatic fibrosis    History of alcohol dependence (HCC)    History of left atrial appendage closure 03/19/2022   a.) 24 mm Watchman FLX device placed 03/19/2022   HTN (hypertension)    Hyperlipidemia    Insomnia    a.) on trazodone PRN   Iron deficiency    Ischemic cardiomyopathy    a.) MV 04/05/2015: EF 30-35%; b.) TTE 01/08/2017: EF 30%; c.) TTE 09/22/2020: EF 30%;; d.) TTE 04/30/2022: EF >55%   LBBB (left bundle branch block)    Mild dementia (HCC)    a.) on acetylcholinesterase inhibitor (donepazil)   MRSA nasal colonization 10/18/2023   a.) presurgical PCR (+) 10/18/2023 prior to THORACIC LAMINECTOMY FOR SPINAL CORD STIMULATOR PLACEMENT   Myocardial infarction (HCC) 09/2006   Neuropathy    NSVT (nonsustained ventricular tachycardia) (HCC)    a.) multiple episodes following CRT-D placement (  below device threshold); b.) s/p VT ablation 05/24/2022 -->  significant epicardial/mid-myocardial portions of circuit noted and procedure was prolonged -> aborted before LV substrate was fully ablated d/t worsening LV function and increased hemodynamic support  needs following 5 administered shocks; c.) staged VT ablation 05/31/2022 - successful   Pancreatitis    PTSD (post-traumatic stress disorder)    Pulmonary hypertension (HCC)    a.) TTE 01/08/2017: RVSP 50.5; b.) TTE 09/22/2020: RVSP 48.5   PVC (premature ventricular contraction)    a.) holter 12/25/2016:  underlying A.fib with variable ventricular response at average rate of 86 bpm (range 49-141 bpm); 2461 single PVCs and 43 PVC couplets   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   a.) LIMA-LAD, SVG-OM1   SNHL (sensorineural hearing loss)    Spinal stenosis of lumbar region    Tremor     Social History: Social History   Socioeconomic History   Marital status: Married    Spouse name: Angelique Blonder   Number of children: 2   Years of education: Not on file   Highest education level: Not on file  Occupational History   Not on file  Tobacco Use   Smoking status: Former    Current packs/day: 0.00    Types: Cigarettes    Quit date: 10/07/2003    Years since quitting: 20.1   Smokeless tobacco: Never  Vaping Use   Vaping status: Never Used  Substance and Sexual Activity   Alcohol use: Not Currently    Comment: NONE SINCE 2018   Drug use: Not Currently   Sexual activity: Not on file  Other Topics Concern   Not on file  Social History Narrative   Not on file   Social Drivers of Health   Financial Resource Strain: Not on file  Food Insecurity: Not on file  Transportation Needs: No Transportation Needs (05/28/2022)   Received from [facility], [facility]   PRAPARE - Transportation    In the past 12 months, has lack of transportation kept you from medical appointments or from getting medications?: No    Lack of Transportation (Non-Medical): No  Physical Activity: Not on file  Stress: Not on file  Social Connections: Not on file  Intimate Partner Violence: Not on file    Family History: Family History  Problem Relation Age of Onset   Heart attack Father     Review of Systems:  Review of Systems - General ROS: Negative Psychological ROS: Negative Ophthalmic ROS: Negative ENT ROS: Negative Hematological and Lymphatic ROS: Negative  Endocrine ROS: Negative Respiratory ROS: Negative Cardiovascular ROS: Negative Gastrointestinal ROS: Negative Genito-Urinary  ROS: Negative Musculoskeletal ROS: Positive for back pain Neurological ROS: Negative leg pain or numbness Dermatological ROS: Negative  Physical Exam: BP 128/63   Pulse (!) 59   Temp 97.8 F (36.6 C) (Temporal)   Resp 14   Ht 5\' 9"  (1.753 m)   Wt 82.6 kg   SpO2 95%   BMI 26.88 kg/m  Body mass index is 26.88 kg/m. Body surface area is 2.01 meters squared. General appearance: Alert, cooperative, in no acute distress Head: Normocephalic, atraumatic Eyes: Normal, EOM intact Oropharynx: Moist without lesions Back: Midline thoracic vision is warm and erythematous, left flank incision is open with drainage Ext: No edema in LE bilaterally  Neurologic exam:  Mental status: alertness: alert, affect: normal Speech: fluent and clear Motor:strength symmetric 5/5 in bilateral lower extremities Sensory: intact to light touch in bilateral lower extremities Gait: Not tested  Imaging: Thoracic spine x-rays: Stable placement of the spinal cord stimulator   Impression/Plan:  Danny Marquez is here for concern for ongoing infection with prior spinal cord similar placement.  Given the appearance of the wounds, I do think I&D is needed and we discussed that given the hardware, this would need to be removed.  We will obtain cultures and continue antibiotics.   1.  Diagnosis: Postoperative wound infection  2.  Plan Proceed to the OR today for I&D of the wounds and removal of hardware  Lucy Chris, MD

## 2023-12-11 NOTE — Consult Note (Signed)
 NAME: Danny Marquez  DOB: 1946/01/19  MRN: [id number]  Date/Time: 12/11/2023 10:21 AM  REQUESTING PROVIDER Manning Charity Subjective:  REASON FOR CONSULT: spine stimulator infection hardware infection ? Danny Marquez is a 78 y.o. with a history of CAD s/p CABG, AICD,Afib, congenital solitary left kidney h/o lumbar surgery with cage and pins, b/l TKA On 10/31/23 underwent thoracic laminectomy for spinal cord stimulator placement for chronic pain The incision site became red and with some discharge and on 3/21 he was prescribed Keflex HE was sent to the ED for admission and removal of hardware on 12/10/23.   12/10/23  BP 87/64 (L)  Temp 98.3 F (36.8 C)  Pulse Rate 59 !  Resp 16  SpO2 99 %  Weight 182 lb    Latest Reference Range & Units 12/10/23  WBC 4.0 - 10.5 K/uL 5.3  Hemoglobin 13.0 - 17.0 g/dL 44.0  HCT 34.7 - 42.5 % 41.9  Platelets 150 - 400 K/uL 261  Creatinine 0.61 - 1.24 mg/dL 9.56   Started on vanco and zosyn HE had spinal stimulator hardware removed this morning I am asked to see him for antibiotic management    Past Medical History:  Diagnosis Date   Anemia    Aortic atherosclerosis (HCC)    ASD (atrial septal defect) 04/30/2022   a.) TTE 04/30/2022: trivial iatrogenic ASD   Atrial fibrillation (HCC)    a.) CHA2DS2VASc = 5 (age x2, HFrEF, HTN, prior MI/vascular disease) as of 10/30/2023; b.) s/p DCCV 01/23/2017 (150 x 1); c.) s/p DCCV 02/01/2017; d.) s/p PVI + roof line ablation 01/12/2022; e.) s/p LAA closure (Watchman) 03/19/2022; f.) rate/rhythm maintained on oral metoprolol succinate; not on chronic OAC   B12 deficiency    Benign essential hypertension    Bilateral carotid artery disease (HCC)    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   a.) Medtronic device CRT-D device placed 07/23/2021   CHF (congestive heart failure) (HCC)    a.) MV 04/05/2015: EF 30-35%; b.) TTE 01/08/2017: EF 30%, glob HK, mod LVE, mod  BAE, RVSP 50.5, mild PR, mod MR/TR; c.) TTE 09/22/2020: EF 30%, glob HK, mod BAE/BVE, RVSP 48.5, mild PR, mod MR/TR; d.) TTE 04/30/2022: EF >55%, triv iatrogenic ASD, mod MR, triv TR   Chronic low back pain    Chronic pain syndrome    Chronic passive hepatic congestion    Cirrhosis (HCC)    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)    Coronary artery disease    a.) MI (unk type) 09/2006; b.) s/p 2v CABG 09/13/2006; c.) LHC 01/23/2022: 60% pLAD, 100% mLAD, 100% D1, 100% pLCx, 100% mLCx, 100% OM1, 70% OM2, 80% OM3, 40% pRCA, 99% mRCA, 100% dRCAl LIMA-LAD and SVG-OM1 bypass grafts were patent --> med mgmt; d.) MV 04/05/2015: no ischemia;   Depression    Difficulty walking    Diverticulosis    Failed back surgical syndrome    GERD (gastroesophageal reflux disease)    Gout    Hepatic fibrosis    History of alcohol dependence (HCC)    History of left atrial appendage closure 03/19/2022   a.) 24 mm Watchman FLX device placed 03/19/2022   HTN (hypertension)    Hyperlipidemia    Insomnia    a.) on trazodone PRN   Iron deficiency    Ischemic cardiomyopathy    a.) MV 04/05/2015: EF 30-35%; b.) TTE 01/08/2017: EF 30%; c.) TTE 09/22/2020: EF 30%;; d.) TTE 04/30/2022: EF >55%   LBBB (left bundle  branch block)    Mild dementia (HCC)    a.) on acetylcholinesterase inhibitor (donepazil)   MRSA nasal colonization 10/18/2023   a.) presurgical PCR (+) 10/18/2023 prior to THORACIC LAMINECTOMY FOR SPINAL CORD STIMULATOR PLACEMENT   Myocardial infarction [facility]) 09/2006   Neuropathy    NSVT (nonsustained ventricular tachycardia) (HCC)    a.) multiple episodes following CRT-D placement (below device threshold); b.) s/p VT ablation 05/24/2022 -->  significant epicardial/mid-myocardial portions of circuit noted and procedure was prolonged -> aborted before LV substrate was fully ablated d/t worsening LV function and increased hemodynamic support  needs following 5 administered shocks; c.) staged VT  ablation 05/31/2022 - successful   Pancreatitis    PTSD (post-traumatic stress disorder)    Pulmonary hypertension (HCC)    a.) TTE 01/08/2017: RVSP 50.5; b.) TTE 09/22/2020: RVSP 48.5   PVC (premature ventricular contraction)    a.) holter 12/25/2016: underlying A.fib with variable ventricular response at average rate of 86 bpm (range 49-141 bpm); 2461 single PVCs and 43 PVC couplets   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   a.) LIMA-LAD, SVG-OM1   SNHL (sensorineural hearing loss)    Spinal stenosis of lumbar region    Tremor     Past Surgical History:  Procedure Laterality Date   BIV MEDTRONIC ICD INSERTION CRT-D N/A 07/23/2021   CARDIOVERSION N/A 01/23/2017   Procedure: CARDIOVERSION;  Surgeon: Dalia Heading, MD;  Location: [facility] ORS;  Service: Cardiovascular;  Laterality: N/A;   CARDIOVERSION N/A 02/01/2017   Procedure: CARDIOVERSION;  Surgeon: Dalia Heading, MD;  Location: [facility] ORS;  Service: Cardiovascular;  Laterality: N/A;   CHOLECYSTECTOMY     COLONOSCOPY WITH PROPOFOL N/A 03/05/2019   Procedure: COLONOSCOPY WITH PROPOFOL;  Surgeon: Christena Deem, MD;  Location: [facility] ENDOSCOPY;  Service: Endoscopy;  Laterality: N/A;   CORONARY ARTERY BYPASS GRAFT N/A 09/13/2006   ESOPHAGOGASTRODUODENOSCOPY (EGD) WITH PROPOFOL N/A 03/05/2019   Procedure: ESOPHAGOGASTRODUODENOSCOPY (EGD) WITH PROPOFOL;  Surgeon: Christena Deem, MD;  Location: [facility] ENDOSCOPY;  Service: Endoscopy;  Laterality: N/A;   LEFT HEART CATH AND CORONARY ANGIOGRAPHY Left 09/2006   LEFT HEART CATH AND CORS/GRAFTS ANGIOGRAPHY Left 01/23/2022   LUMBAR SPINE SURGERY     PIN AND CAGE PLACED   SACROILIAC JOINT FUSION Right    SHOULDER ARTHROSCOPY Right    THORACIC LAMINECTOMY FOR SPINAL CORD STIMULATOR N/A 10/31/2023   Procedure: THORACIC LAMINECTOMY FOR SPINAL CORD STIMULATOR PLACEMENT (MEDTRONIC);  Surgeon: Lovenia Kim, MD;  Location: [facility] ORS;  Service: Neurosurgery;  Laterality: N/A;   TOTAL KNEE ARTHROPLASTY Bilateral     V TACH ABLATION N/A 05/24/2022   aborted due to decreased LV function and hemodynamic instability; staged procedure planned   V TACH ABLATION (successful staged procedure) N/A 05/31/2022   WATCHMAN IMPLANT N/A 03/19/2022    Social History   Socioeconomic History   Marital status: Married    Spouse name: Angelique Blonder   Number of children: 2   Years of education: Not on file   Highest education level: Not on file  Occupational History   Not on file  Tobacco Use   Smoking status: Former    Current packs/day: 0.00    Types: Cigarettes    Quit date: 10/07/2003    Years since quitting: 20.1   Smokeless tobacco: Never  Vaping Use   Vaping status: Never Used  Substance and Sexual Activity   Alcohol use: Not Currently    Comment: NONE SINCE 2018   Drug use: Not Currently  Sexual activity: Not on file  Other Topics Concern   Not on file  Social History Narrative   Not on file   Social Drivers of Health   Financial Resource Strain: Not on file  Food Insecurity: Not on file  Transportation Needs: No Transportation Needs (05/28/2022)   Received from [facility], [facility]   [facility] - Transportation    In the past 12 months, has lack of transportation kept you from medical appointments or from getting medications?: No    Lack of Transportation (Non-Medical): No  Physical Activity: Not on file  Stress: Not on file  Social Connections: Not on file  Intimate Partner Violence: Not on file    Family History  Problem Relation Age of Onset   Heart attack Father    Allergies  Allergen Reactions   Codeine Nausea Only    dizzy   I? Current Facility-Administered Medications  Medication Dose Route Frequency Provider Last Rate Last Admin   [MAR Hold] acetaminophen (TYLENOL) tablet 650 mg  650 mg Oral Q6H PRN Andris Baumann, MD       Or   Mitzi Hansen Hold] acetaminophen (TYLENOL) suppository 650 mg  650 mg Rectal Q6H PRN Andris Baumann, MD       [MAR  Hold] albuterol (PROVENTIL) (2.5 MG/3ML) 0.083% nebulizer solution 2.5 mg  2.5 mg Nebulization Q2H PRN Andris Baumann, MD       [MAR Hold] melatonin tablet 5 mg  5 mg Oral QHS Lindajo Royal V, MD   5 mg at 12/11/23 0010   [MAR Hold] morphine (PF) 2 MG/ML injection 2 mg  2 mg Intravenous Q2H PRN Andris Baumann, MD       [MAR Hold] ondansetron [facility]) tablet 4 mg  4 mg Oral Q6H PRN Andris Baumann, MD       Or   Mitzi Hansen Hold] ondansetron [facility]) injection 4 mg  4 mg Intravenous Q6H PRN Andris Baumann, MD       [MAR Hold] oxyCODONE (Oxy IR/ROXICODONE) immediate release tablet 5 mg  5 mg Oral Q4H PRN Andris Baumann, MD       [MAR Hold] piperacillin-tazobactam (ZOSYN) IVPB 3.375 g  3.375 g Intravenous Q8H Otelia Sergeant, RPH   Stopped at 12/11/23 0736   [MAR Hold] vancomycin (VANCOREADY) IVPB 1500 mg/300 mL  1,500 mg Intravenous Q24H Effie Shy, RPH         Abtx:  Anti-infectives (From admission, onward)    Start     Dose/Rate Route Frequency Ordered Stop   12/11/23 2100  [MAR Hold]  vancomycin (VANCOREADY) IVPB 1500 mg/300 mL        (MAR Hold since Wed 12/11/2023 at 0948.Hold Reason: Transfer to a Procedural area)   1,500 mg 150 mL/hr over 120 Minutes Intravenous Every 24 hours 12/10/23 2008 12/18/23 2059   12/11/23 0400  [MAR Hold]  piperacillin-tazobactam (ZOSYN) IVPB 3.375 g        (MAR Hold since Wed 12/11/2023 at 0948.Hold Reason: Transfer to a Procedural area)   3.375 g 12.5 mL/hr over 240 Minutes Intravenous Every 8 hours 12/11/23 0045     12/10/23 2100  vancomycin (VANCOREADY) IVPB 2000 mg/400 mL        2,000 mg 200 mL/hr over 120 Minutes Intravenous  Once 12/10/23 2008 12/10/23 2345   12/10/23 1945  cefTRIAXone (ROCEPHIN) 2 g in sodium chloride 0.9 % 100 mL IVPB        2 g 200  mL/hr over 30 Minutes Intravenous  Once 12/10/23 1940 12/10/23 2036       REVIEW OF SYSTEMS:  Const: negative fever, negative chills, negative weight loss Eyes: negative diplopia or visual changes,  negative eye pain ENT: negative coryza, negative sore throat Resp: negative cough, hemoptysis, dyspnea Cards: negative for chest pain, palpitations, lower extremity edema GU: negative for frequency, dysuria and hematuria GI: Negative for abdominal pain, diarrhea, bleeding, constipation Skin: negative for rash and pruritus Heme: negative for easy bruising and gum/nose bleeding UJ:WJXB pain Neurolo:negative for headaches, dizziness, vertigo, memory problems  Psych: negative for feelings of anxiety, depression  Endocrine: negative for thyroid, diabetes Allergy/Immunology- codeine: Objective:  VITALS:  BP 128/63   Pulse (!) 59   Temp 97.8 F (36.6 C) (Temporal)   Resp 14   Ht 5\' 9"  (1.753 m)   Wt 82.6 kg   SpO2 95%   BMI 26.88 kg/m   PHYSICAL EXAM:  General: Alert,not the most cooperative , no distress,  Head: Normocephalic, without obvious abnormality, atraumatic. Eyes: Conjunctivae clear, anicteric sclerae. Pupils are equal ENT did not examine Neck:, symmetrical, no adenopathy, thyroid: non tender no carotid bruit and no JVD. Back:           surgical sites covered with honey comb dressing Lungs: Clear to auscultation bilaterally. No Wheezing or Rhonchi. No rales. Heart: irregular Abdomen: Soft, non-tender,not distended. Bowel sounds normal. No masses Extremities: atraumatic, no cyanosis. No edema. No clubbing Skin: No rashes or lesions. Or bruising Lymph: Cervical, supraclavicular normal. Neurologic: Grossly non-focal Pertinent Labs Lab Results CBC    Component Value Date/Time   WBC 5.3 12/10/2023 1622   RBC 4.69 12/10/2023 1622   HGB 13.0 12/10/2023 1622   HGB 11.7 (L) 09/07/2013 0944   HCT 41.9 12/10/2023 1622   HCT 37.2 (L) 09/07/2013 0944   PLT 261 12/10/2023 1622   PLT 224 09/07/2013 0944   MCV 89.3 12/10/2023 1622   MCV 79 (L) 09/07/2013 0944   MCH 27.7 12/10/2023 1622   MCHC 31.0 12/10/2023 1622   RDW 15.3 12/10/2023 1622   RDW 27.4 (H)  09/07/2013 0944   LYMPHSABS 0.9 12/10/2023 1622   LYMPHSABS 0.8 (L) 09/07/2013 0944   MONOABS 0.4 12/10/2023 1622   MONOABS 0.5 09/07/2013 0944   EOSABS 0.6 (H) 12/10/2023 1622   EOSABS 0.4 09/07/2013 0944   BASOSABS 0.0 12/10/2023 1622   BASOSABS 0.0 09/07/2013 0944       Latest Ref Rng & Units 12/11/2023    3:43 AM 12/10/2023    4:22 PM 10/18/2023    2:51 PM  CMP  Glucose 70 - 99 mg/dL 91  147  99   BUN 8 - 23 mg/dL 12  14  18    Creatinine 0.61 - 1.24 mg/dL 8.29  5.62  1.30   Sodium 135 - 145 mmol/L 132  138  137   Potassium 3.5 - 5.1 mmol/L 4.0  4.4  4.0   Chloride 98 - 111 mmol/L 102  106  105   CO2 22 - 32 mmol/L 22  26  27    Calcium 8.9 - 10.3 mg/dL 8.8  9.8  9.6   Microbiology: Culture sent  Radiology     Hardware seen    ? Impression/Recommendation Spine stimulator site infection- s/p extraction of hardware today? Culture sent Pt is currently on vanco and zosyn As he has single kidney avoid this combination DC zosyn- will do cefepime Depending on culture result can de-escalate  H/o CAD s/p CABG  Congenital solitary left kidney  Afib AICD  H/o B/l TKA H/o Lumbar fusion  Discussed with patient, requesting provider

## 2023-12-11 NOTE — Assessment & Plan Note (Addendum)
 Holding metoprolol due to low blood pressure Continue aspirin, Crestor

## 2023-12-11 NOTE — Interval H&P Note (Signed)
 History and Physical Interval Note:  12/11/2023 11:02 AM  Danny Marquez  has presented today for surgery, with the diagnosis of T81.49XA  Infected Surgical Wound.  The various methods of treatment have been discussed with the patient and family. After consideration of risks, benefits and other options for treatment, the patient has consented to  Procedure(s): REMOVAL, SPINAL CORD STIMULATOR, THORACIC (N/A) as a surgical intervention.  The patient's history has been reviewed, patient examined, no change in status, stable for surgery.  I have reviewed the patient's chart and labs.  Questions were answered to the patient's satisfaction.     Danny Marquez

## 2023-12-11 NOTE — Assessment & Plan Note (Signed)
 Delirium precautions Continue Aricept

## 2023-12-11 NOTE — Assessment & Plan Note (Signed)
 Does not appear to be on systemic anticoagulation Currently rate controlled

## 2023-12-11 NOTE — ED Notes (Signed)
 This

## 2023-12-11 NOTE — Op Note (Signed)
 Operative Note   SURGERY DATE:  12/11/2023   PRE-OP DIAGNOSIS:  Chronic pain syndrome   POST-OP DIAGNOSIS: Post-Op Diagnosis Codes: Chronic pain syndrome   Procedure(s) with comments: Removal of spinal cord stimulator and pulse generator, I&D wounds  SURGEON:     * Nathaniel Man, MD        ANESTHESIA: General    OPERATIVE FINDINGS: Wound infection   Indication Mr Guyette underwent placement of thoracic SCS 6 weeks ago and had poor wound healing and was treated with antibiotics. He developed more drainage from incision and he was admitted for planned I&D.  Procedure The patient was brought to the operating room where vascular access was obtained and intubated by the anesthesia service.   The patient was turned prone onto a Wilson frame. The patient was prepped and draped in a sterile fashion. A hard time out was performed.   The thoracic incision was opened sharply over the midline and continued through the fascia to the level of the lamina. The wires were exposed and cut. These wires were followed deep to the epidural space. There was no obvious purulent fluid. The wires were dissected until the caudal portion of the paddle seen and then gentle retraction applied to remove paddle.  The left flank incision was opened sharply down to exposed the battery. The leads were then pulled from the midline and the battery removed. There was no obvious purulence seen here. Hemostasis was achieved. Each incision was irrigated with Irrisept and saline.   Then, each incision was closed with combination of 0, 2-0 vicryls. The skin was closed with 3-0 Nylon in the thoracic area and flank. Sterile dressings were applied. The patient was returned to supine position and extubated. The patient was seen to be moving all extremities symmetrically and was taken to PACU for recovery. The family was updated and all questions answered.   ESTIMATED BLOOD LOSS:   20 cc   SPECIMENS None   IMPLANT None      I performed the case in its entirety with assistance of PA, Flora Lipps, MD [phone]

## 2023-12-11 NOTE — Hospital Course (Addendum)
 Danny Marquez is a 78 y.o. 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, HTN, solitary kidney remote history of alcohol use disorder, failed back syndrome referred from the [place] to [facility] where he underwent laminectomy and placement of spinal stimulator on 10/2023, complicated by postoperative infection a couple weeks prior for which he completed a course of Keflex and last seen by his orthopedist on 3/26 when his incision appeared improved in appearance, who was sent in by neurosurgery due to concern for postoperative infection with plans for spinal stimulator removal and washout, which was performed 4/2.  Final culture came back with MRSA, susceptible to doxycycline.  Discussed with Dr. Drue Second, okay to treat with doxycycline for 2 weeks.

## 2023-12-11 NOTE — Assessment & Plan Note (Addendum)
 S/p laminectomy and spinal stimulator 10/2023 Chronic back pain Zosyn and vancomycin Pain control Follow blood cultures N.p.o. for surgical procedure Formal neurosurgery consult placed

## 2023-12-11 NOTE — H&P (Addendum)
 History and Physical    Patient: Danny Marquez ZOX:[id number] DOB: 09-05-1946 DOA: 12/10/2023 DOS: the patient was seen and examined on 12/11/2023 PCP: [facility]  Patient coming from: Home  Chief Complaint:  Chief Complaint  Patient presents with   Wound Infection    HPI: Danny Marquez is a 78 y.o. 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  Most of the history is provided by wife at bedside who on the day of arrival,  noticed swelling redness and oozing from the more distal incision in the upper lumbar midline area.  He has had no more fevers.  He denies pain in the area of the oozing and no foul odor but he has ongoing pain in the more proximal incision in the midline thoracic spinal area.  No fever, vomiting, change in mental status. ED course and data review: Vitals within normal limits however as the night progressed he was hypotensive with systolics in the high 80s to low 90s.Labs notable for normal CBC and BMP and normal lactic acid X-rays done of the lumbar and thoracic spine showed no acute findings.     Past Medical History:  Diagnosis Date   Anemia    Aortic atherosclerosis (HCC)    ASD (atrial septal defect) 04/30/2022   a.) TTE 04/30/2022: trivial iatrogenic ASD   Atrial fibrillation (HCC)    a.) CHA2DS2VASc = 5 (age x2, HFrEF, HTN, prior MI/vascular disease) as of  10/30/2023; b.) s/p DCCV 01/23/2017 (150 x 1); c.) s/p DCCV 02/01/2017; d.) s/p PVI + roof line ablation 01/12/2022; e.) s/p LAA closure (Watchman) 03/19/2022; f.) rate/rhythm maintained on oral metoprolol succinate; not on chronic OAC   B12 deficiency    Benign essential hypertension    Bilateral carotid artery disease (HCC)    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   a.) Medtronic device CRT-D device placed 07/23/2021   CHF (congestive heart failure) (HCC)    a.) MV 04/05/2015: EF 30-35%; b.) TTE 01/08/2017: EF 30%, glob HK, mod LVE, mod BAE, RVSP 50.5, mild PR, mod MR/TR; c.) TTE 09/22/2020: EF 30%, glob HK, mod BAE/BVE, RVSP 48.5, mild PR, mod MR/TR; d.) TTE 04/30/2022: EF >55%, triv iatrogenic ASD, mod MR, triv TR   Chronic low back pain    Chronic pain syndrome    Chronic passive hepatic congestion    Cirrhosis (HCC)    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)    Coronary artery disease    a.) MI (unk type) 09/2006; b.) s/p 2v CABG 09/13/2006; c.) LHC 01/23/2022: 60% pLAD, 100% mLAD, 100% D1, 100% pLCx, 100% mLCx, 100% OM1, 70% OM2, 80% OM3, 40% pRCA, 99% mRCA, 100% dRCAl LIMA-LAD and SVG-OM1 bypass grafts were patent --> med mgmt; d.) MV 04/05/2015: no ischemia;   Depression    Difficulty walking    Diverticulosis    Failed back surgical syndrome    GERD (gastroesophageal reflux disease)    Gout    Hepatic  fibrosis    History of alcohol dependence (HCC)    History of left atrial appendage closure 03/19/2022   a.) 24 mm Watchman FLX device placed 03/19/2022   HTN (hypertension)    Hyperlipidemia    Insomnia    a.) on trazodone PRN   Iron deficiency    Ischemic cardiomyopathy    a.) MV 04/05/2015: EF 30-35%; b.) TTE 01/08/2017: EF 30%; c.) TTE 09/22/2020: EF 30%;; d.) TTE 04/30/2022: EF >55%   LBBB (left bundle branch block)    Mild dementia (HCC)    a.) on acetylcholinesterase inhibitor (donepazil)   MRSA nasal colonization  10/18/2023   a.) presurgical PCR (+) 10/18/2023 prior to THORACIC LAMINECTOMY FOR SPINAL CORD STIMULATOR PLACEMENT   Myocardial infarction [facility]) 09/2006   Neuropathy    NSVT (nonsustained ventricular tachycardia) (HCC)    a.) multiple episodes following CRT-D placement (below device threshold); b.) s/p VT ablation 05/24/2022 -->  significant epicardial/mid-myocardial portions of circuit noted and procedure was prolonged -> aborted before LV substrate was fully ablated d/t worsening LV function and increased hemodynamic support  needs following 5 administered shocks; c.) staged VT ablation 05/31/2022 - successful   Pancreatitis    PTSD (post-traumatic stress disorder)    Pulmonary hypertension (HCC)    a.) TTE 01/08/2017: RVSP 50.5; b.) TTE 09/22/2020: RVSP 48.5   PVC (premature ventricular contraction)    a.) holter 12/25/2016: underlying A.fib with variable ventricular response at average rate of 86 bpm (range 49-141 bpm); 2461 single PVCs and 43 PVC couplets   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   a.) LIMA-LAD, SVG-OM1   SNHL (sensorineural hearing loss)    Spinal stenosis of lumbar region    Tremor    Past Surgical History:  Procedure Laterality Date   BIV MEDTRONIC ICD INSERTION CRT-D N/A 07/23/2021   CARDIOVERSION N/A 01/23/2017   Procedure: CARDIOVERSION;  Surgeon: Dalia Heading, MD;  Location: [facility] ORS;  Service: Cardiovascular;  Laterality: N/A;   CARDIOVERSION N/A 02/01/2017   Procedure: CARDIOVERSION;  Surgeon: Dalia Heading, MD;  Location: [facility] ORS;  Service: Cardiovascular;  Laterality: N/A;   CHOLECYSTECTOMY     COLONOSCOPY WITH PROPOFOL N/A 03/05/2019   Procedure: COLONOSCOPY WITH PROPOFOL;  Surgeon: Christena Deem, MD;  Location: [facility] ENDOSCOPY;  Service: Endoscopy;  Laterality: N/A;   CORONARY ARTERY BYPASS GRAFT N/A 09/13/2006   ESOPHAGOGASTRODUODENOSCOPY (EGD) WITH PROPOFOL N/A 03/05/2019   Procedure: ESOPHAGOGASTRODUODENOSCOPY (EGD) WITH PROPOFOL;  Surgeon: Christena Deem, MD;  Location: [facility] ENDOSCOPY;  Service: Endoscopy;  Laterality: N/A;   LEFT HEART CATH AND CORONARY ANGIOGRAPHY Left 09/2006   LEFT HEART CATH AND CORS/GRAFTS ANGIOGRAPHY Left 01/23/2022   LUMBAR SPINE SURGERY     PIN AND CAGE PLACED   SACROILIAC JOINT FUSION Right    SHOULDER ARTHROSCOPY Right    THORACIC LAMINECTOMY FOR SPINAL CORD STIMULATOR N/A 10/31/2023   Procedure: THORACIC LAMINECTOMY FOR SPINAL CORD STIMULATOR PLACEMENT (MEDTRONIC);  Surgeon: Lovenia Kim, MD;  Location: [facility] ORS;  Service: Neurosurgery;  Laterality: N/A;   TOTAL KNEE ARTHROPLASTY Bilateral    V TACH ABLATION N/A 05/24/2022   aborted due to decreased LV function and hemodynamic instability; staged procedure planned   V TACH ABLATION (successful staged procedure) N/A 05/31/2022   WATCHMAN IMPLANT N/A 03/19/2022   Social History:  reports that he quit smoking about 20 years ago. His smoking use included cigarettes. He has never used smokeless tobacco. He reports that he does not currently use alcohol. He reports that he  does not currently use drugs.  Allergies  Allergen Reactions   Codeine Nausea Only    dizzy    Family History  Problem Relation Age of Onset   Heart attack Father     Prior to Admission medications   Medication Sig Start Date End Date Taking? Authorizing Provider  allopurinol (ZYLOPRIM) 100 MG tablet Take 200 mg by mouth in the morning. 02/18/19  Yes [provider]  aspirin EC 81 MG tablet Take 81 mg by mouth in the morning. Swallow whole.   Yes [provider]  donepezil (ARICEPT) 10 MG tablet Take 10 mg by mouth at bedtime.   Yes [provider]  empagliflozin (JARDIANCE) 25 MG TABS tablet Take 12.5 mg by mouth in the morning.   Yes [provider]  losartan (COZAAR) 25 MG tablet Take 12.5 mg by mouth in the morning.   Yes [provider]  metoprolol succinate (TOPROL-XL) 25 MG 24 hr tablet Take 12.5 mg by mouth in the morning.  08/21/23  Yes [provider]  oxyCODONE (ROXICODONE) 5 MG immediate release tablet Take 1 tablet (5 mg total) by mouth every 4 (four) hours as needed for severe pain (pain score 7-10). 10/31/23  Yes Joan Flores, PA-C  pantoprazole (PROTONIX) 20 MG tablet Take 20 mg by mouth daily before breakfast. 01/02/23  Yes [provider]  pregabalin (LYRICA) 150 MG capsule Take 150 mg by mouth 2 (two) times daily.   Yes [provider]  ranolazine (RANEXA) 500 MG 12 hr tablet Take 500 mg by mouth 2 (two) times daily.   Yes [provider]  rosuvastatin (CRESTOR) 5 MG tablet Take 5 mg by mouth at bedtime. 02/14/19  Yes [provider]  sertraline (ZOLOFT) 100 MG tablet Take 100 mg by mouth in the morning. 08/23/16  Yes [provider]  torsemide (DEMADEX) 20 MG tablet Take 20 mg by mouth daily as needed (fluid retention/swelling).   Yes [provider]  traZODone (DESYREL) 50 MG tablet Take 75 mg by mouth at bedtime.   Yes [provider]    Physical Exam: Vitals:   12/11/23 0215 12/11/23 0238 12/11/23 0242 12/11/23 0243  BP:  132/73    Pulse:  82 63   Resp: 15   15  Temp: 97.8 F (36.6 C)   97.8 F (36.6 C)  TempSrc: Oral   Oral  SpO2:  96% 95% 95%  Weight:      Height:       Physical Exam Vitals and nursing note reviewed.  Constitutional:      General: He is not in acute distress. HENT:     Head: Normocephalic and atraumatic.  Cardiovascular:     Rate and Rhythm: Normal rate and regular rhythm.     Heart sounds: Normal heart sounds.  Pulmonary:     Effort: Pulmonary effort is normal.     Breath sounds: Normal breath sounds.  Abdominal:     Palpations: Abdomen is soft.     Tenderness: There is no abdominal tenderness.  Musculoskeletal:     Comments: See pictures below which include pictures from patient's phone showing serosanguineous and ?purulent oozing from lumbar wound  Neurological:     Mental Status: Mental  status is at baseline.            Labs on Admission: I have personally reviewed following labs and imaging studies  CBC: Recent Labs  Lab 12/10/23 1622  WBC 5.3  NEUTROABS 3.5  HGB  13.0  HCT 41.9  MCV 89.3  PLT 261   Basic Metabolic Panel: Recent Labs  Lab 12/10/23 1622  NA 138  K 4.4  CL 106  CO2 26  GLUCOSE 101*  BUN 14  CREATININE 1.21  CALCIUM 9.8   GFR: Estimated Creatinine Clearance: 51.1 mL/min (by C-G formula based on SCr of 1.21 mg/dL). Liver Function Tests: No results for input(s): "AST", "ALT", "ALKPHOS", "BILITOT", "PROT", "ALBUMIN" in the last 168 hours. No results for input(s): "LIPASE", "AMYLASE" in the last 168 hours. No results for input(s): "AMMONIA" in the last 168 hours. Coagulation Profile: No results for input(s): "INR", "PROTIME" in the last 168 hours. Cardiac Enzymes: No results for input(s): "CKTOTAL", "CKMB", "CKMBINDEX", "TROPONINI" in the last 168 hours. BNP (last 3 results) No results for input(s): "PROBNP" in the last 8760 hours. HbA1C: No results for input(s): "HGBA1C" in the last 72 hours. CBG: No results for input(s): "GLUCAP" in the last 168 hours. Lipid Profile: No results for input(s): "CHOL", "HDL", "LDLCALC", "TRIG", "CHOLHDL", "LDLDIRECT" in the last 72 hours. Thyroid Function Tests: No results for input(s): "TSH", "T4TOTAL", "FREET4", "T3FREE", "THYROIDAB" in the last 72 hours. Anemia Panel: No results for input(s): "VITAMINB12", "FOLATE", "FERRITIN", "TIBC", "IRON", "RETICCTPCT" in the last 72 hours. Urine analysis:    Component Value Date/Time   COLORURINE AMBER (A) 03/03/2022 0433   APPEARANCEUR CLEAR (A) 03/03/2022 0433   LABSPEC 1.020 03/03/2022 0433   PHURINE 5.0 03/03/2022 0433   GLUCOSEU >=500 (A) 03/03/2022 0433   HGBUR NEGATIVE 03/03/2022 0433   BILIRUBINUR NEGATIVE 03/03/2022 0433   KETONESUR 5 (A) 03/03/2022 0433   PROTEINUR NEGATIVE 03/03/2022 0433   NITRITE NEGATIVE 03/03/2022 0433    LEUKOCYTESUR NEGATIVE 03/03/2022 0433    Radiological Exams on Admission: DG Lumbar Spine 2-3 Views Result Date: 12/10/2023 CLINICAL DATA:  Spinal stimulator, low back pain EXAM: LUMBAR SPINE - 2-3 VIEW COMPARISON:  09/27/2022 FINDINGS: Post instrumented fusion L4-S1. Stable hardware across the right sacroiliac joint. Implanted generator is noted, cephalad extent of lead not visualized on this study. No fracture or dislocation. Mild narrowing of interspaces L2-L4. Aortic Atherosclerosis (ICD10-170.0). IMPRESSION: 1. No acute findings. 2. Post   fusion L4-S1. Electronically Signed   By: Corlis Leak M.D.   On: 12/10/2023 18:11   DG Thoracic Spine 2 View Result Date: 12/10/2023 CLINICAL DATA:  [id number] Pre-op exam [id number] EXAM: THORACIC SPINE 2 VIEWS COMPARISON:  10/31/2023 FINDINGS: Mild thoracic kyphosis. No fracture or dislocation. Dorsal stimulator catheters extend up to the T8 level. Sternotomy wires. AICD leads partially visualized. IMPRESSION: 1. No acute findings. 2. Dorsal stimulator catheters. Electronically Signed   By: Corlis Leak M.D.   On: 12/10/2023 18:09     Data Reviewed: Relevant notes from primary care and specialist visits, past discharge summaries as available in EHR, including Care Everywhere. Prior diagnostic testing as pertinent to current admission diagnoses Updated medications and problem lists for reconciliation ED course, including vitals, labs, imaging, treatment and response to treatment Triage notes, nursing and pharmacy notes and ED provider's notes Notable results as noted in HPI   Assessment and Plan: * Surgical wound infection S/p laminectomy and spinal stimulator 10/2023 Chronic back pain Zosyn and vancomycin Pain control Follow blood cultures N.p.o. for surgical procedure Formal neurosurgery consult placed  Solitary kidney Creatinine normal  Mild dementia (HCC) Delirium precautions Continue Aricept  History of coronary artery bypass surgery Holding  metoprolol due to low blood pressure Continue aspirin, Crestor  Hypertension Holding antihypertensives due to soft blood  pressure to resume as appropriate  Chronic systolic congestive heart failure (HCC) Ischemic cardiomyopathy s/p AICD Holding losartan and metoprolol torsemide due to low blood pressures Daily weights with intake and output monitoring Last echo on file is from 04/2022 showing EF over 55%, likely HFrEF with improved EF Consider cardiology consult for clearance given comorbidities   Atrial fibrillation (HCC) Does not appear to be on systemic anticoagulation Currently rate controlled    DVT prophylaxis: SCD  Consults: Neurosurgery, Dr. Adriana Simas  Advance Care Planning:   Code Status: Full Code   Family Communication: Wife at bedside  Disposition Plan: Back to previous home environment  Severity of Illness: The appropriate patient status for this patient is INPATIENT. Inpatient status is judged to be reasonable and necessary in order to provide the required intensity of service to ensure the patient's safety. The patient's presenting symptoms, physical exam findings, and initial radiographic and laboratory data in the context of their chronic comorbidities is felt to place them at high risk for further clinical deterioration. Furthermore, it is not anticipated that the patient will be medically stable for discharge from the hospital within 2 midnights of admission.   * I certify that at the point of admission it is my clinical judgment that the patient will require inpatient hospital care spanning beyond 2 midnights from the point of admission due to high intensity of service, high risk for further deterioration and high frequency of surveillance required.*  Author: Andris Baumann, MD 12/11/2023 3:12 AM  For on call review [email or web address].

## 2023-12-11 NOTE — Hospital Course (Signed)
 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  Most of the history is provided by wife at bedside who on the day of arrival,  noticed swelling redness and oozing from the more distal incision in the upper lumbar midline area.  He has had no more fevers.  He denies pain in the area of the oozing and no foul odor but he has ongoing pain in the more proximal incision in the midline thoracic spinal area.  No fever, vomiting, change in mental status. ED course and data review: Vitals within normal limits however as the night progressed he was hypotensive with systolics in the high 80s to low 90s.Labs notable for normal CBC and BMP and normal lactic acid X-rays done of the lumbar and thoracic spine showed no acute findings.

## 2023-12-11 NOTE — Assessment & Plan Note (Signed)
 Creatinine normal.

## 2023-12-11 NOTE — Transfer of Care (Signed)
 Immediate Anesthesia Transfer of Care Note  Patient: Danny Marquez  Procedure(s) Performed: REMOVAL, SPINAL CORD STIMULATOR, THORACIC  Patient Location: PACU  Anesthesia Type:General  Level of Consciousness: awake, alert , and oriented  Airway & Oxygen Therapy: Patient Spontanous Breathing and Patient connected to nasal cannula oxygen  Post-op Assessment: Report given to RN and Post -op Vital signs reviewed and stable  Post vital signs: Reviewed and stable  Last Vitals:  Vitals Value Taken Time  BP 134/61 12/11/23 1227  Temp 36.3 C 12/11/23 1227  Pulse 61 12/11/23 1227  Resp 22 12/11/23 1227  SpO2 100 % 12/11/23 1227  Vitals shown include unfiled device data.  Last Pain:  Vitals:   12/11/23 1227  TempSrc: Temporal  PainSc:       Patients Stated Pain Goal: 0 (12/11/23 0501)  Complications: No notable events documented.

## 2023-12-11 NOTE — Consult Note (Addendum)
 [facility] CLINIC CARDIOLOGY CONSULT NOTE       Patient ID: Danny Marquez MRN: [id number] DOB/AGE: 1945/11/07 78 y.o.  Admit date: 12/10/2023 Referring Physician Dr. Marrion [facility]  Primary Cardiologist Dr. Jean Rosenthal ([facility]) Reason for Consultation POC  HPI: Danny Marquez is a 78 y.o. male  with a past medical history of CAD s/p CABG, CKD, HTN and ischemic CM s/p CRT-D, EF 30%, persistent atrial fibrillation who presented to the ED on 12/10/2023 for spinal stimulator infection. Cardiology was consulted for preoperative evaluation.   Patient reports that back in February he underwent laminectomy and had a spinal stimulator placed.  Afterwards he noticed redness and drainage from this and was treated outpatient for infection.  His infection continued to get worse and thus he was sent for surgical removal and washout.  At the time my evaluation this morning he is resting in stretcher in PACU.  Labs on admission notable for creatinine 1.21, potassium 4.4, sodium 138, hemoglobin 13.0, WBC 5.3.  No EKG was done on arrival.  At the time of my evaluation this morning patient is resting in stretcher.  We discussed his cardiac history in further detail.  He states that he has not had any issues recently with chest pain or shortness of breath.  Also reports that he has no functional limitations and overall has been doing well and tolerating activities without problems.  Also denies any heart racing, palpitations, dizziness, lightheadedness, syncope.  Denies any recent cardiac complaints.  Review of systems complete and found to be negative unless listed above    Past Medical History:  Diagnosis Date   Anemia    Aortic atherosclerosis (HCC)    ASD (atrial septal defect) 04/30/2022   a.) TTE 04/30/2022: trivial iatrogenic ASD   Atrial fibrillation (HCC)    a.) CHA2DS2VASc = 5 (age x2, HFrEF, HTN, prior MI/vascular disease) as of 10/30/2023; b.) s/p DCCV  01/23/2017 (150 x 1); c.) s/p DCCV 02/01/2017; d.) s/p PVI + roof line ablation 01/12/2022; e.) s/p LAA closure (Watchman) 03/19/2022; f.) rate/rhythm maintained on oral metoprolol succinate; not on chronic OAC   B12 deficiency    Benign essential hypertension    Bilateral carotid artery disease (HCC)    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   a.) Medtronic device CRT-D device placed 07/23/2021   CHF (congestive heart failure) (HCC)    a.) MV 04/05/2015: EF 30-35%; b.) TTE 01/08/2017: EF 30%, glob HK, mod LVE, mod BAE, RVSP 50.5, mild PR, mod MR/TR; c.) TTE 09/22/2020: EF 30%, glob HK, mod BAE/BVE, RVSP 48.5, mild PR, mod MR/TR; d.) TTE 04/30/2022: EF >55%, triv iatrogenic ASD, mod MR, triv TR   Chronic low back pain    Chronic pain syndrome    Chronic passive hepatic congestion    Cirrhosis (HCC)    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)    Coronary artery disease    a.) MI (unk type) 09/2006; b.) s/p 2v CABG 09/13/2006; c.) LHC 01/23/2022: 60% pLAD, 100% mLAD, 100% D1, 100% pLCx, 100% mLCx, 100% OM1, 70% OM2, 80% OM3, 40% pRCA, 99% mRCA, 100% dRCAl LIMA-LAD and SVG-OM1 bypass grafts were patent --> med mgmt; d.) MV 04/05/2015: no ischemia;   Depression    Difficulty walking    Diverticulosis    Failed back surgical syndrome    GERD (gastroesophageal reflux disease)    Gout    Hepatic fibrosis    History of alcohol dependence (HCC)  History of left atrial appendage closure 03/19/2022   a.) 24 mm Watchman FLX device placed 03/19/2022   HTN (hypertension)    Hyperlipidemia    Insomnia    a.) on trazodone PRN   Iron deficiency    Ischemic cardiomyopathy    a.) MV 04/05/2015: EF 30-35%; b.) TTE 01/08/2017: EF 30%; c.) TTE 09/22/2020: EF 30%;; d.) TTE 04/30/2022: EF >55%   LBBB (left bundle branch block)    Mild dementia (HCC)    a.) on acetylcholinesterase inhibitor (donepazil)   MRSA nasal colonization 10/18/2023   a.) presurgical PCR  (+) 10/18/2023 prior to THORACIC LAMINECTOMY FOR SPINAL CORD STIMULATOR PLACEMENT   Myocardial infarction [facility]) 09/2006   Neuropathy    NSVT (nonsustained ventricular tachycardia) (HCC)    a.) multiple episodes following CRT-D placement (below device threshold); b.) s/p VT ablation 05/24/2022 -->  significant epicardial/mid-myocardial portions of circuit noted and procedure was prolonged -> aborted before LV substrate was fully ablated d/t worsening LV function and increased hemodynamic support  needs following 5 administered shocks; c.) staged VT ablation 05/31/2022 - successful   Pancreatitis    PTSD (post-traumatic stress disorder)    Pulmonary hypertension (HCC)    a.) TTE 01/08/2017: RVSP 50.5; b.) TTE 09/22/2020: RVSP 48.5   PVC (premature ventricular contraction)    a.) holter 12/25/2016: underlying A.fib with variable ventricular response at average rate of 86 bpm (range 49-141 bpm); 2461 single PVCs and 43 PVC couplets   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   a.) LIMA-LAD, SVG-OM1   SNHL (sensorineural hearing loss)    Spinal stenosis of lumbar region    Tremor     Past Surgical History:  Procedure Laterality Date   BIV MEDTRONIC ICD INSERTION CRT-D N/A 07/23/2021   CARDIOVERSION N/A 01/23/2017   Procedure: CARDIOVERSION;  Surgeon: Dalia Heading, MD;  Location: [facility] ORS;  Service: Cardiovascular;  Laterality: N/A;   CARDIOVERSION N/A 02/01/2017   Procedure: CARDIOVERSION;  Surgeon: Dalia Heading, MD;  Location: [facility] ORS;  Service: Cardiovascular;  Laterality: N/A;   CHOLECYSTECTOMY     COLONOSCOPY WITH PROPOFOL N/A 03/05/2019   Procedure: COLONOSCOPY WITH PROPOFOL;  Surgeon: Christena Deem, MD;  Location: [facility] ENDOSCOPY;  Service: Endoscopy;  Laterality: N/A;   CORONARY ARTERY BYPASS GRAFT N/A 09/13/2006   ESOPHAGOGASTRODUODENOSCOPY (EGD) WITH PROPOFOL N/A 03/05/2019   Procedure: ESOPHAGOGASTRODUODENOSCOPY (EGD) WITH PROPOFOL;  Surgeon: Christena Deem, MD;  Location: [facility]  ENDOSCOPY;  Service: Endoscopy;  Laterality: N/A;   LEFT HEART CATH AND CORONARY ANGIOGRAPHY Left 09/2006   LEFT HEART CATH AND CORS/GRAFTS ANGIOGRAPHY Left 01/23/2022   LUMBAR SPINE SURGERY     PIN AND CAGE PLACED   SACROILIAC JOINT FUSION Right    SHOULDER ARTHROSCOPY Right    THORACIC LAMINECTOMY FOR SPINAL CORD STIMULATOR N/A 10/31/2023   Procedure: THORACIC LAMINECTOMY FOR SPINAL CORD STIMULATOR PLACEMENT (MEDTRONIC);  Surgeon: Lovenia Kim, MD;  Location: [facility] ORS;  Service: Neurosurgery;  Laterality: N/A;   TOTAL KNEE ARTHROPLASTY Bilateral    V TACH ABLATION N/A 05/24/2022   aborted due to decreased LV function and hemodynamic instability; staged procedure planned   V TACH ABLATION (successful staged procedure) N/A 05/31/2022   WATCHMAN IMPLANT N/A 03/19/2022    Medications Prior to Admission  Medication Sig Dispense Refill Last Dose/Taking   allopurinol (ZYLOPRIM) 100 MG tablet Take 200 mg by mouth in the morning.   12/10/2023 at  1:00 PM   aspirin EC 81 MG tablet Take 81 mg by mouth in  the morning. Swallow whole.   12/09/2023 at 10:00 PM   donepezil (ARICEPT) 10 MG tablet Take 10 mg by mouth at bedtime.   12/09/2023 at 10:00 PM   empagliflozin (JARDIANCE) 25 MG TABS tablet Take 12.5 mg by mouth in the morning.   12/09/2023 at  9:00 AM   losartan (COZAAR) 25 MG tablet Take 12.5 mg by mouth in the morning.   12/10/2023 at  1:00 PM   metoprolol succinate (TOPROL-XL) 25 MG 24 hr tablet Take 12.5 mg by mouth in the morning.   12/10/2023 at  1:00 PM   oxyCODONE (ROXICODONE) 5 MG immediate release tablet Take 1 tablet (5 mg total) by mouth every 4 (four) hours as needed for severe pain (pain score 7-10). 30 tablet 0 12/10/2023   pantoprazole (PROTONIX) 20 MG tablet Take 20 mg by mouth daily before breakfast.   12/10/2023 at  1:00 PM   pregabalin (LYRICA) 150 MG capsule Take 150 mg by mouth 2 (two) times daily.   12/10/2023   ranolazine (RANEXA) 500 MG 12 hr tablet Take 500 mg by mouth 2 (two) times  daily.   12/10/2023 at  1:00 PM   rosuvastatin (CRESTOR) 5 MG tablet Take 5 mg by mouth at bedtime.   12/09/2023 at 10:00 PM   sertraline (ZOLOFT) 100 MG tablet Take 100 mg by mouth in the morning.   12/10/2023 at  1:00 PM   torsemide (DEMADEX) 20 MG tablet Take 20 mg by mouth daily as needed (fluid retention/swelling).   11/04/2023   traZODone (DESYREL) 50 MG tablet Take 75 mg by mouth at bedtime.   12/09/2023 at 10:00 PM   Social History   Socioeconomic History   Marital status: Married    Spouse name: Angelique Blonder   Number of children: 2   Years of education: Not on file   Highest education level: Not on file  Occupational History   Not on file  Tobacco Use   Smoking status: Former    Current packs/day: 0.00    Types: Cigarettes    Quit date: 10/07/2003    Years since quitting: 20.1   Smokeless tobacco: Never  Vaping Use   Vaping status: Never Used  Substance and Sexual Activity   Alcohol use: Not Currently    Comment: NONE SINCE 2018   Drug use: Not Currently   Sexual activity: Not on file  Other Topics Concern   Not on file  Social History Narrative   Not on file   Social Drivers of Health   Financial Resource Strain: Not on file  Food Insecurity: Not on file  Transportation Needs: No Transportation Needs (05/28/2022)   [facility]   PRAPARE - Transportation    In the past 12 months, has lack of transportation kept you from medical appointments or from getting medications?: No    Lack of Transportation (Non-Medical): No  Physical Activity: Not on file  Stress: Not on file  Social Connections: Not on file  Intimate Partner Violence: Not on file    Family History  Problem Relation Age of Onset   Heart attack Father      Vitals:   12/11/23 0600 12/11/23 0700 12/11/23 0800 12/11/23 0959  BP: 129/61  118/60 128/63  Pulse: 62 70 (!) 59   Resp:    14  Temp:   (P) 98 F (36.7 C) 97.8 F (36.6 C)  TempSrc:   (P)  Oral Temporal  SpO2: 94% 95%  90% 95%  Weight:    82.6 kg  Height:    5\' 9"  (1.753 m)    PHYSICAL EXAM General: Well-appearing elderly male, well nourished, in no acute distress. HEENT: Normocephalic and atraumatic. Neck: No JVD.  Lungs: Normal respiratory effort on . Clear bilaterally to auscultation. No wheezes, crackles, rhonchi.  Heart: HRRR. Normal S1 and S2 without gallops or murmurs.  Abdomen: Non-distended appearing.  Msk: Normal strength and tone for age. Extremities: Warm and well perfused. No clubbing, cyanosis.  No edema.  Neuro: Alert and oriented X 3. Psych: Answers questions appropriately.   Labs: Basic Metabolic Panel: Recent Labs    12/10/23 1622 12/11/23 0343  NA 138 132*  K 4.4 4.0  CL 106 102  CO2 26 22  GLUCOSE 101* 91  BUN 14 12  CREATININE 1.21 1.07  CALCIUM 9.8 8.8*   Liver Function Tests: No results for input(s): "AST", "ALT", "ALKPHOS", "BILITOT", "PROT", "ALBUMIN" in the last 72 hours. No results for input(s): "LIPASE", "AMYLASE" in the last 72 hours. CBC: Recent Labs    12/10/23 1622  WBC 5.3  NEUTROABS 3.5  HGB 13.0  HCT 41.9  MCV 89.3  PLT 261   Cardiac Enzymes: No results for input(s): "CKTOTAL", "CKMB", "CKMBINDEX", "TROPONINIHS" in the last 72 hours. BNP: No results for input(s): "BNP" in the last 72 hours. D-Dimer: No results for input(s): "DDIMER" in the last 72 hours. Hemoglobin A1C: No results for input(s): "HGBA1C" in the last 72 hours. Fasting Lipid Panel: No results for input(s): "CHOL", "HDL", "LDLCALC", "TRIG", "CHOLHDL", "LDLDIRECT" in the last 72 hours. Thyroid Function Tests: No results for input(s): "TSH", "T4TOTAL", "T3FREE", "THYROIDAB" in the last 72 hours.  Invalid input(s): "FREET3" Anemia Panel: No results for input(s): "VITAMINB12", "FOLATE", "FERRITIN", "TIBC", "IRON", "RETICCTPCT" in the last 72 hours.   Radiology: DG Lumbar Spine 2-3 Views Result Date: 12/10/2023 CLINICAL DATA:  Spinal stimulator,  low back pain EXAM: LUMBAR SPINE - 2-3 VIEW COMPARISON:  09/27/2022 FINDINGS: Post instrumented fusion L4-S1. Stable hardware across the right sacroiliac joint. Implanted generator is noted, cephalad extent of lead not visualized on this study. No fracture or dislocation. Mild narrowing of interspaces L2-L4. Aortic Atherosclerosis (ICD10-170.0). IMPRESSION: 1. No acute findings. 2. Post   fusion L4-S1. Electronically Signed   By: Corlis Leak M.D.   On: 12/10/2023 18:11   DG Thoracic Spine 2 View Result Date: 12/10/2023 CLINICAL DATA:  [id number] Pre-op exam [id number] EXAM: THORACIC SPINE 2 VIEWS COMPARISON:  10/31/2023 FINDINGS: Mild thoracic kyphosis. No fracture or dislocation. Dorsal stimulator catheters extend up to the T8 level. Sternotomy wires. AICD leads partially visualized. IMPRESSION: 1. No acute findings. 2. Dorsal stimulator catheters. Electronically Signed   By: Corlis Leak M.D.   On: 12/10/2023 18:09    ECHO TEE 04/2022: 1. Well seated Watchman device with no peri-device leak    2. Iatrogenic ASD, trivial    3. Moderate TR    4. Moderate TR    5. Normal LV function   TELEMETRY reviewed by me 12/11/2023: Not on telemetry  EKG reviewed by me: No EKG done on admission  Data reviewed by me 12/11/2023: last 24h vitals tele labs imaging I/O ED provider note, admission H&P  Principal Problem:   Surgical wound infection Active Problems:   Atrial fibrillation (HCC)   Chronic systolic congestive heart failure (HCC)   Ischemic cardiomyopathy   Hypertension   History of coronary artery bypass surgery   Mild dementia (HCC)   Solitary kidney  ASSESSMENT AND PLAN:  Danny Marquez is a 78 y.o. male  with a past medical history of CAD s/p CABG, CKD, HTN and ischemic CM s/p CRT-D, EF 30%, persistent atrial fibrillation who presented to the ED on 12/10/2023 for spinal stimulator infection. Cardiology was consulted for preoperative evaluation.   # Preoperative cardiac evaluation # Coronary  artery disease s/p CABG # Ischemic cardiomyopathy s/p CRT-D # Persistent atrial fibrillation Patient with a history of coronary artery disease status post CABG as well as ischemic cardiomyopathy status post CRT-D presented for removal and washout of spinal stimulator.  No recent cardiac complaints or concerns.  Labs unremarkable. -Patient is at elevated but acceptable risk for proceeding with spinal stimulator removal and washout given significant cardiac history understanding risk/benefits.  He is without cardiac complaints now or in the recent months. Can proceed with spinal stimulator removal and washout with Dr. Adriana Simas without further cardiac diagnostics needed.  -Recommend continuing home cardiac meds perioperatively. -Patient should follow-up with primary cardiologist within a few weeks of discharge. -Will plan to see tomorrow postoperatively.  This patient's plan of care was discussed and created with Dr. Darrold Junker and he is in agreement.  Signed: Gale Journey, PA-C  12/11/2023, 10:34 AM [facility] Cardiology

## 2023-12-11 NOTE — Consult Note (Signed)
 Pharmacy Antibiotic Note  Danny Marquez is a 78 y.o. male admitted on 12/10/2023 with cellulitis. Patient underwent thoracic laminectomy for spinal cord stimulator placement on 10/31/2023. Unfortunately developed some redness at his incision site approximately 2 weeks ago and was given a week of Keflex. Initially it did appear to improve, but now it appears to be worsening with active drainage at his incision site. Pharmacy has been consulted for Vancomycin dosing for 7 days duration.  4/02 Pharmacy now consulted for Zosyn dosing for wound infection Plan:  Ordered Zosyn 3.375 (4 hr infusion) q8hr  Scr 1.21 (at BL) WBC 5.3  Afebrile  Already received Ceftriaxone 2g x 1  Plan: Day 1 of antibiotics Give vancomycin 2000 mg IV x1  Followed by 1500 mg IV Q24H. Goal AUC 400-550. Expected AUC: 538.5 Expected Css min: 13.0 SCr used: 1.21  Weight used: IBW, Vd used: 0.72 (BMI 26.9) Continue to monitor renal function and follow culture results  Height: 5\' 9"  (175.3 cm) Weight: 82.6 kg (182 lb) IBW/kg (Calculated) : 70.7  Temp (24hrs), Avg:98.3 F (36.8 C), Min:98.3 F (36.8 C), Max:98.3 F (36.8 C)  Recent Labs  Lab 12/10/23 1622  WBC 5.3  CREATININE 1.21  LATICACIDVEN 0.6    Estimated Creatinine Clearance: 51.1 mL/min (by C-G formula based on SCr of 1.21 mg/dL).    Allergies  Allergen Reactions   Codeine Nausea Only    dizzy    Antimicrobials this admission: 4/1 Ceftriaxone x 1 4/1 Vancomycin x 7 days 4/2 Zosyn >>  Dose adjustments this admission: NA  Microbiology results: No cultures collected at this time   Thank you for allowing pharmacy to be a part of this patient's care.  Otelia Sergeant, PharmD, [facility] 12/11/2023 1:09 AM

## 2023-12-11 NOTE — Anesthesia Procedure Notes (Signed)
 Procedure Name: Intubation Date/Time: 12/11/2023 11:16 AM  Performed by: Rich Brave, CRNAPre-anesthesia Checklist: Patient identified, Emergency Drugs available, Suction available, Patient being monitored and Timeout performed Patient Re-evaluated:Patient Re-evaluated prior to induction Oxygen Delivery Method: Circle system utilized Preoxygenation: Pre-oxygenation with 100% oxygen Induction Type: IV induction Ventilation: Mask ventilation without difficulty Laryngoscope Size: McGrath and 4 Grade View: Grade I Tube type: Oral Tube size: 7.0 mm Number of attempts: 1 Airway Equipment and Method: Stylet and Video-laryngoscopy Placement Confirmation: ETT inserted through vocal cords under direct vision, positive ETCO2 and breath sounds checked- equal and bilateral Secured at: 21 cm Tube secured with: Tape Dental Injury: Teeth and Oropharynx as per pre-operative assessment

## 2023-12-11 NOTE — Assessment & Plan Note (Addendum)
 Ischemic cardiomyopathy s/p AICD Holding losartan and metoprolol torsemide due to low blood pressures Daily weights with intake and output monitoring Last echo on file is from 04/2022 showing EF over 55%, likely HFrEF with improved EF Consider cardiology consult for clearance given comorbidities

## 2023-12-11 NOTE — Anesthesia Preprocedure Evaluation (Signed)
 Anesthesia Evaluation  Patient identified by MRN, date of birth, ID band Patient awake    Reviewed: Allergy & Precautions, NPO status , Patient's Chart, lab work & pertinent test results  History of Anesthesia Complications Negative for: history of anesthetic complications  Airway Mallampati: III  TM Distance: >3 FB Neck ROM: full    Dental  (+) Chipped, Upper Dentures, Dental Advidsory Given   Pulmonary neg shortness of breath, neg COPD, neg recent URI, former smoker   Pulmonary exam normal        Cardiovascular Exercise Tolerance: Good hypertension, (-) angina + CAD, + Past MI, + CABG and +CHF  Normal cardiovascular exam+ dysrhythmias + Cardiac Defibrillator      Neuro/Psych  PSYCHIATRIC DISORDERS      negative neurological ROS     GI/Hepatic Neg liver ROS,GERD  ,,  Endo/Other  negative endocrine ROS    Renal/GU Renal disease     Musculoskeletal   Abdominal   Peds  Hematology negative hematology ROS (+)   Anesthesia Other Findings Patient has cardiac clearance for this procedure.   Past Medical History: No date: Anemia No date: Aortic atherosclerosis (HCC) 04/30/2022: ASD (atrial septal defect)     Comment:  a.) TTE 04/30/2022: trivial iatrogenic ASD No date: Atrial fibrillation (HCC)     Comment:  a.) CHA2DS2VASc = 5 (age x2, HFrEF, HTN, prior               MI/vascular disease) as of 10/30/2023; b.) s/p DCCV               01/23/2017 (150 x 1); c.) s/p DCCV 02/01/2017; d.) s/p               PVI + roof line ablation 01/12/2022; e.) s/p LAA closure               (Watchman) 03/19/2022; f.) rate/rhythm maintained on oral              metoprolol succinate; not on chronic OAC No date: B12 deficiency No date: Benign essential hypertension No date: Bilateral carotid artery disease (HCC) 07/23/2021: Cardiac resynchronization therapy defibrillator (CRT-D)  in place     Comment:  a.) Medtronic device CRT-D device  placed 07/23/2021 No date: CHF (congestive heart failure) (HCC)     Comment:  a.) MV 04/05/2015: EF 30-35%; b.) TTE 01/08/2017: EF               30%, glob HK, mod LVE, mod BAE, RVSP 50.5, mild PR, mod               MR/TR; c.) TTE 09/22/2020: EF 30%, glob HK, mod BAE/BVE,               RVSP 48.5, mild PR, mod MR/TR; d.) TTE 04/30/2022: EF               >55%, triv iatrogenic ASD, mod MR, triv TR No date: Chronic low back pain No date: Chronic pain syndrome No date: Chronic passive hepatic congestion No date: Cirrhosis (HCC) No date: 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) No date: Coronary artery disease     Comment:  a.) MI (unk type) 09/2006; b.) s/p 2v CABG 09/13/2006;               c.) LHC 01/23/2022: 60% pLAD, 100% mLAD, 100% D1, 100%               pLCx, 100% mLCx, 100% OM1, 70% OM2, 80% OM3, 40%  pRCA,               99% mRCA, 100% dRCAl LIMA-LAD and SVG-OM1 bypass grafts               were patent --> med mgmt; d.) MV 04/05/2015: no ischemia; No date: Depression No date: Difficulty walking No date: Diverticulosis 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 No date: GERD (gastroesophageal reflux disease) No date: Gout No date: Hepatic fibrosis No date: History of alcohol dependence (HCC) 03/19/2022: History of left atrial appendage closure     Comment:  a.) 24 mm Watchman FLX device placed 03/19/2022 No date: HTN (hypertension) No date: Hyperlipidemia No date: Insomnia     Comment:  a.) on trazodone PRN No date: Iron deficiency No date: Ischemic cardiomyopathy     Comment:  a.) MV 04/05/2015: EF 30-35%; b.) TTE 01/08/2017: EF               30%; c.) TTE 09/22/2020: EF 30%;; d.) TTE 04/30/2022: EF               >55% No date: LBBB (left bundle branch block) No date: Mild dementia (HCC)     Comment:  a.) on acetylcholinesterase inhibitor (donepazil) 10/18/2023: MRSA nasal colonization     Comment:  a.) presurgical PCR (+) 10/18/2023 prior to THORACIC                LAMINECTOMY FOR SPINAL CORD STIMULATOR PLACEMENT 09/2006: Myocardial infarction (HCC) No date: Neuropathy No date: NSVT (nonsustained ventricular tachycardia) (HCC)     Comment:  a.) multiple episodes following CRT-D placement (below               device threshold); b.) s/p VT ablation 05/24/2022 -->                significant epicardial/mid-myocardial portions of circuit              noted and procedure was prolonged -> aborted before LV               substrate was fully ablated d/t worsening LV function and              increased hemodynamic support  needs following 5               administered shocks; c.) staged VT ablation 05/31/2022 -               successful No date: Pancreatitis No date: PTSD (post-traumatic stress disorder) No date: Pulmonary hypertension (HCC)     Comment:  a.) TTE 01/08/2017: RVSP 50.5; b.) TTE 09/22/2020: RVSP               48.5 No date: PVC (premature ventricular contraction)     Comment:  a.) holter 12/25/2016: underlying A.fib with variable               ventricular response at average rate of 86 bpm (range               49-141 bpm); 2461 single PVCs and 43 PVC couplets 09/13/2006: S/P CABG x 2     Comment:  a.) LIMA-LAD, SVG-OM1 No date: SNHL (sensorineural hearing loss) No date: Spinal stenosis of lumbar region No date: Tremor  Past Surgical History: 07/23/2021: BIV MEDTRONIC ICD INSERTION CRT-D; N/A 01/23/2017: CARDIOVERSION; N/A     Comment:  Procedure: CARDIOVERSION;  Surgeon: Dalia Heading, MD;  Location: [facility] ORS;  Service: Cardiovascular;                Laterality: N/A; 02/01/2017: CARDIOVERSION; N/A     Comment:  Procedure: CARDIOVERSION;  Surgeon: Dalia Heading, MD;              Location: [facility] ORS;  Service: Cardiovascular;                Laterality: N/A; No date: CHOLECYSTECTOMY 03/05/2019: COLONOSCOPY WITH PROPOFOL; N/A     Comment:  Procedure: COLONOSCOPY WITH PROPOFOL;  Surgeon:               Christena Deem, MD;   Location: [facility] ENDOSCOPY;                Service: Endoscopy;  Laterality: N/A; 09/13/2006: CORONARY ARTERY BYPASS GRAFT; N/A 03/05/2019: ESOPHAGOGASTRODUODENOSCOPY (EGD) WITH PROPOFOL; N/A     Comment:  Procedure: ESOPHAGOGASTRODUODENOSCOPY (EGD) WITH               PROPOFOL;  Surgeon: Christena Deem, MD;  Location:               [facility] ENDOSCOPY;  Service: Endoscopy;  Laterality: N/A; 09/2006: LEFT HEART CATH AND CORONARY ANGIOGRAPHY; Left 01/23/2022: LEFT HEART CATH AND CORS/GRAFTS ANGIOGRAPHY; Left No date: LUMBAR SPINE SURGERY     Comment:  PIN AND CAGE PLACED No date: SACROILIAC JOINT FUSION; Right No date: SHOULDER ARTHROSCOPY; Right No date: TOTAL KNEE ARTHROPLASTY; Bilateral 05/24/2022: V TACH ABLATION; N/A     Comment:  aborted due to decreased LV function and hemodynamic               instability; staged procedure planned 05/31/2022: V TACH ABLATION (successful staged procedure); N/A 03/19/2022: WATCHMAN IMPLANT; N/A  BMI    Body Mass Index: 30.29 kg/m      Reproductive/Obstetrics negative OB ROS                             Anesthesia Physical Anesthesia Plan  ASA: 4  Anesthesia Plan: General   Post-op Pain Management:    Induction: Intravenous  PONV Risk Score and Plan: Ondansetron, Dexamethasone, Midazolam and Treatment may vary due to age or medical condition  Airway Management Planned: Oral ETT  Additional Equipment:   Intra-op Plan:   Post-operative Plan: Extubation in OR  Informed Consent: I have reviewed the patients History and Physical, chart, labs and discussed the procedure including the risks, benefits and alternatives for the proposed anesthesia with the patient or authorized representative who has indicated his/her understanding and acceptance.     Dental Advisory Given  Plan Discussed with: Anesthesiologist, CRNA and Surgeon  Anesthesia Plan Comments: (Patient and wife consented for risks of anesthesia  including but not limited to:  - adverse reactions to medications - damage to eyes, teeth, lips or other oral mucosa - nerve damage due to positioning  - sore throat or hoarseness - Damage to heart, brain, nerves, lungs, other parts of body or loss of life  Patient voiced understanding and assent.)       Anesthesia Quick Evaluation

## 2023-12-11 NOTE — Assessment & Plan Note (Signed)
 Holding antihypertensives due to soft blood pressure to resume as appropriate

## 2023-12-12 DIAGNOSIS — I4821 Permanent atrial fibrillation: Secondary | ICD-10-CM | POA: Diagnosis not present

## 2023-12-12 DIAGNOSIS — E663 Overweight: Secondary | ICD-10-CM | POA: Insufficient documentation

## 2023-12-12 DIAGNOSIS — T8149XA Infection following a procedure, other surgical site, initial encounter: Secondary | ICD-10-CM | POA: Diagnosis not present

## 2023-12-12 DIAGNOSIS — I5022 Chronic systolic (congestive) heart failure: Secondary | ICD-10-CM | POA: Diagnosis not present

## 2023-12-12 LAB — CREATININE, SERUM
Creatinine, Ser: 1.14 mg/dL (ref 0.61–1.24)
GFR, Estimated: >60 mL/min (ref >60)

## 2023-12-12 NOTE — Progress Notes (Signed)
 [facility] CLINIC CARDIOLOGY PROGRESS NOTE       Patient ID: Danny Marquez MRN: [id number] DOB/AGE: 1946-03-05 78 y.o.  Admit date: 12/10/2023 Referring Physician Dr. Marrion [facility]  Primary Cardiologist Dr. Jean Rosenthal ([facility]) Reason for Consultation POC  HPI: Danny Marquez is a 78 y.o. male  with a past medical history of CAD s/p CABG, CKD, HTN and ischemic CM s/p CRT-D, EF 30%, persistent atrial fibrillation who presented to the ED on 12/10/2023 for spinal stimulator infection. Cardiology was consulted for preoperative evaluation.   Interval history: -Patient seen and examined this morning, sitting upright in bedside chair with wife present. -States that he is feeling better today, continues to have back pain. -Denies any cardiac complaints including chest pain, shortness of breath, palpitations.  Review of systems complete and found to be negative unless listed above    Past Medical History:  Diagnosis Date   Anemia    Aortic atherosclerosis (HCC)    ASD (atrial septal defect) 04/30/2022   a.) TTE 04/30/2022: trivial iatrogenic ASD   Atrial fibrillation (HCC)    a.) CHA2DS2VASc = 5 (age x2, HFrEF, HTN, prior MI/vascular disease) as of 10/30/2023; b.) s/p DCCV 01/23/2017 (150 x 1); c.) s/p DCCV 02/01/2017; d.) s/p PVI + roof line ablation 01/12/2022; e.) s/p LAA closure (Watchman) 03/19/2022; f.) rate/rhythm maintained on oral metoprolol succinate; not on chronic OAC   B12 deficiency    Benign essential hypertension    Bilateral carotid artery disease (HCC)    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   a.) Medtronic device CRT-D device placed 07/23/2021   CHF (congestive heart failure) (HCC)    a.) MV 04/05/2015: EF 30-35%; b.) TTE 01/08/2017: EF 30%, glob HK, mod LVE, mod BAE, RVSP 50.5, mild PR, mod MR/TR; c.) TTE 09/22/2020: EF 30%, glob HK, mod BAE/BVE, RVSP 48.5, mild PR, mod MR/TR; d.) TTE  04/30/2022: EF >55%, triv iatrogenic ASD, mod MR, triv TR   Chronic low back pain    Chronic pain syndrome    Chronic passive hepatic congestion    Cirrhosis (HCC)    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)    Coronary artery disease    a.) MI (unk type) 09/2006; b.) s/p 2v CABG 09/13/2006; c.) LHC 01/23/2022: 60% pLAD, 100% mLAD, 100% D1, 100% pLCx, 100% mLCx, 100% OM1, 70% OM2, 80% OM3, 40% pRCA, 99% mRCA, 100% dRCAl LIMA-LAD and SVG-OM1 bypass grafts were patent --> med mgmt; d.) MV 04/05/2015: no ischemia;   Depression    Difficulty walking    Diverticulosis    Failed back surgical syndrome    GERD (gastroesophageal reflux disease)    Gout    Hepatic fibrosis    History of alcohol dependence (HCC)    History of left atrial appendage closure 03/19/2022   a.) 24 mm Watchman FLX device placed 03/19/2022   HTN (hypertension)    Hyperlipidemia    Insomnia    a.) on trazodone PRN   Iron deficiency    Ischemic cardiomyopathy    a.) MV 04/05/2015: EF 30-35%; b.) TTE 01/08/2017: EF 30%; c.) TTE 09/22/2020: EF 30%;; d.) TTE 04/30/2022: EF >55%   LBBB (left bundle branch block)    Mild dementia (HCC)    a.) on acetylcholinesterase inhibitor (donepazil)   MRSA nasal colonization 10/18/2023   a.) presurgical PCR (+) 10/18/2023 prior to THORACIC LAMINECTOMY FOR SPINAL CORD STIMULATOR PLACEMENT   Myocardial infarction (HCC) 09/2006   Neuropathy  NSVT (nonsustained ventricular tachycardia) (HCC)    a.) multiple episodes following CRT-D placement (below device threshold); b.) s/p VT ablation 05/24/2022 -->  significant epicardial/mid-myocardial portions of circuit noted and procedure was prolonged -> aborted before LV substrate was fully ablated d/t worsening LV function and increased hemodynamic support  needs following 5 administered shocks; c.) staged VT ablation 05/31/2022 - successful   Pancreatitis    PTSD (post-traumatic stress disorder)    Pulmonary hypertension (HCC)    a.)  TTE 01/08/2017: RVSP 50.5; b.) TTE 09/22/2020: RVSP 48.5   PVC (premature ventricular contraction)    a.) holter 12/25/2016: underlying A.fib with variable ventricular response at average rate of 86 bpm (range 49-141 bpm); 2461 single PVCs and 43 PVC couplets   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   a.) LIMA-LAD, SVG-OM1   SNHL (sensorineural hearing loss)    Spinal stenosis of lumbar region    Tremor     Past Surgical History:  Procedure Laterality Date   BIV MEDTRONIC ICD INSERTION CRT-D N/A 07/23/2021   CARDIOVERSION N/A 01/23/2017   Procedure: CARDIOVERSION;  Surgeon: Dalia Heading, MD;  Location: [facility] ORS;  Service: Cardiovascular;  Laterality: N/A;   CARDIOVERSION N/A 02/01/2017   Procedure: CARDIOVERSION;  Surgeon: Dalia Heading, MD;  Location: [facility] ORS;  Service: Cardiovascular;  Laterality: N/A;   CHOLECYSTECTOMY     COLONOSCOPY WITH PROPOFOL N/A 03/05/2019   Procedure: COLONOSCOPY WITH PROPOFOL;  Surgeon: Christena Deem, MD;  Location: [facility] ENDOSCOPY;  Service: Endoscopy;  Laterality: N/A;   CORONARY ARTERY BYPASS GRAFT N/A 09/13/2006   ESOPHAGOGASTRODUODENOSCOPY (EGD) WITH PROPOFOL N/A 03/05/2019   Procedure: ESOPHAGOGASTRODUODENOSCOPY (EGD) WITH PROPOFOL;  Surgeon: Christena Deem, MD;  Location: [facility] ENDOSCOPY;  Service: Endoscopy;  Laterality: N/A;   LEFT HEART CATH AND CORONARY ANGIOGRAPHY Left 09/2006   LEFT HEART CATH AND CORS/GRAFTS ANGIOGRAPHY Left 01/23/2022   LUMBAR SPINE SURGERY     PIN AND CAGE PLACED   SACROILIAC JOINT FUSION Right    SHOULDER ARTHROSCOPY Right    THORACIC LAMINECTOMY FOR SPINAL CORD STIMULATOR N/A 10/31/2023   Procedure: THORACIC LAMINECTOMY FOR SPINAL CORD STIMULATOR PLACEMENT (MEDTRONIC);  Surgeon: Lovenia Kim, MD;  Location: [facility] ORS;  Service: Neurosurgery;  Laterality: N/A;   TOTAL KNEE ARTHROPLASTY Bilateral    V TACH ABLATION N/A 05/24/2022   aborted due to decreased LV function and hemodynamic instability; staged procedure planned    V TACH ABLATION (successful staged procedure) N/A 05/31/2022   WATCHMAN IMPLANT N/A 03/19/2022    Medications Prior to Admission  Medication Sig Dispense Refill Last Dose/Taking   allopurinol (ZYLOPRIM) 100 MG tablet Take 200 mg by mouth in the morning.   12/10/2023 at  1:00 PM   aspirin EC 81 MG tablet Take 81 mg by mouth in the morning. Swallow whole.   12/09/2023 at 10:00 PM   donepezil (ARICEPT) 10 MG tablet Take 10 mg by mouth at bedtime.   12/09/2023 at 10:00 PM   empagliflozin (JARDIANCE) 25 MG TABS tablet Take 12.5 mg by mouth in the morning.   12/09/2023 at  9:00 AM   losartan (COZAAR) 25 MG tablet Take 12.5 mg by mouth in the morning.   12/10/2023 at  1:00 PM   metoprolol succinate (TOPROL-XL) 25 MG 24 hr tablet Take 12.5 mg by mouth in the morning.   12/10/2023 at  1:00 PM   oxyCODONE (ROXICODONE) 5 MG immediate release tablet Take 1 tablet (5 mg total) by mouth every 4 (four) hours as needed  for severe pain (pain score 7-10). 30 tablet 0 12/10/2023   pantoprazole (PROTONIX) 20 MG tablet Take 20 mg by mouth daily before breakfast.   12/10/2023 at  1:00 PM   pregabalin (LYRICA) 150 MG capsule Take 150 mg by mouth 2 (two) times daily.   12/10/2023   ranolazine (RANEXA) 500 MG 12 hr tablet Take 500 mg by mouth 2 (two) times daily.   12/10/2023 at  1:00 PM   rosuvastatin (CRESTOR) 5 MG tablet Take 5 mg by mouth at bedtime.   12/09/2023 at 10:00 PM   sertraline (ZOLOFT) 100 MG tablet Take 100 mg by mouth in the morning.   12/10/2023 at  1:00 PM   torsemide (DEMADEX) 20 MG tablet Take 20 mg by mouth daily as needed (fluid retention/swelling).   11/04/2023   traZODone (DESYREL) 50 MG tablet Take 75 mg by mouth at bedtime.   12/09/2023 at 10:00 PM   Social History   Socioeconomic History   Marital status: Married    Spouse name: Angelique Blonder   Number of children: 2   Years of education: Not on file   Highest education level: Not on file  Occupational History   Not on file  Tobacco Use   Smoking status: Former     Current packs/day: 0.00    Types: Cigarettes    Quit date: 10/07/2003    Years since quitting: 20.1   Smokeless tobacco: Never  Vaping Use   Vaping status: Never Used  Substance and Sexual Activity   Alcohol use: Not Currently    Comment: NONE SINCE 2018   Drug use: Not Currently   Sexual activity: Not on file  Other Topics Concern   Not on file  Social History Narrative   Not on file   Social Drivers of Health   Financial Resource Strain: Not on file  Food Insecurity: No Food Insecurity (12/11/2023)   Hunger Vital Sign    Worried About Running Out of Food in the Last Year: Never true    Ran Out of Food in the Last Year: Never true  Transportation Needs: No Transportation Needs (12/11/2023)   PRAPARE - Administrator, Civil Service (Medical): No    Lack of Transportation (Non-Medical): No  Physical Activity: Not on file  Stress: Not on file  Social Connections: Socially Integrated (12/11/2023)   Social Connection and Isolation Panel [NHANES]    Frequency of Communication with Friends and Family: Three times a week    Frequency of Social Gatherings with Friends and Family: More than three times a week    Attends Religious Services: 1 to 4 times per year    Active Member of Clubs or Organizations: Yes    Attends Banker Meetings: More than 4 times per year    Marital Status: Married  Catering manager Violence: Not At Risk (12/11/2023)   Humiliation, Afraid, Rape, and Kick questionnaire    Fear of Current or Ex-Partner: No    Emotionally Abused: No    Physically Abused: No    Sexually Abused: No    Family History  Problem Relation Age of Onset   Heart attack Father      Vitals:   12/11/23 1551 12/11/23 1957 12/12/23 0330 12/12/23 0808  BP: 131/62 (!) 141/60 133/63 (!) 144/52  Pulse: 73 62 63 (!) 58  Resp: 18 20 20 17   Temp: 97.7 F (36.5 C) 97.8 F (36.6 C) 97.7 F (36.5 C) 97.7 F (36.5 C)  TempSrc: Oral  Oral   SpO2: 95% 97% 95% 99%   Weight:      Height:        PHYSICAL EXAM General: Well-appearing elderly male, well nourished, in no acute distress. HEENT: Normocephalic and atraumatic. Neck: No JVD.  Lungs: Normal respiratory effort on . Clear bilaterally to auscultation. No wheezes, crackles, rhonchi.  Heart: HRRR. Normal S1 and S2 without gallops or murmurs.  Abdomen: Non-distended appearing.  Msk: Normal strength and tone for age. Extremities: Warm and well perfused. No clubbing, cyanosis.  No edema.  Neuro: Alert and oriented X 3. Psych: Answers questions appropriately.   Labs: Basic Metabolic Panel: Recent Labs    12/10/23 1622 12/11/23 0343 12/12/23 0341  NA 138 132*  --   K 4.4 4.0  --   CL 106 102  --   CO2 26 22  --   GLUCOSE 101* 91  --   BUN 14 12  --   CREATININE 1.21 1.07 1.14  CALCIUM 9.8 8.8*  --    Liver Function Tests: No results for input(s): "AST", "ALT", "ALKPHOS", "BILITOT", "PROT", "ALBUMIN" in the last 72 hours. No results for input(s): "LIPASE", "AMYLASE" in the last 72 hours. CBC: Recent Labs    12/10/23 1622  WBC 5.3  NEUTROABS 3.5  HGB 13.0  HCT 41.9  MCV 89.3  PLT 261   Cardiac Enzymes: No results for input(s): "CKTOTAL", "CKMB", "CKMBINDEX", "TROPONINIHS" in the last 72 hours. BNP: No results for input(s): "BNP" in the last 72 hours. D-Dimer: No results for input(s): "DDIMER" in the last 72 hours. Hemoglobin A1C: No results for input(s): "HGBA1C" in the last 72 hours. Fasting Lipid Panel: No results for input(s): "CHOL", "HDL", "LDLCALC", "TRIG", "CHOLHDL", "LDLDIRECT" in the last 72 hours. Thyroid Function Tests: No results for input(s): "TSH", "T4TOTAL", "T3FREE", "THYROIDAB" in the last 72 hours.  Invalid input(s): "FREET3" Anemia Panel: No results for input(s): "VITAMINB12", "FOLATE", "FERRITIN", "TIBC", "IRON", "RETICCTPCT" in the last 72 hours.   Radiology: DG Lumbar Spine 2-3 Views Result Date: 12/10/2023 CLINICAL DATA:  Spinal stimulator, low  back pain EXAM: LUMBAR SPINE - 2-3 VIEW COMPARISON:  09/27/2022 FINDINGS: Post instrumented fusion L4-S1. Stable hardware across the right sacroiliac joint. Implanted generator is noted, cephalad extent of lead not visualized on this study. No fracture or dislocation. Mild narrowing of interspaces L2-L4. Aortic Atherosclerosis (ICD10-170.0). IMPRESSION: 1. No acute findings. 2. Post   fusion L4-S1. Electronically Signed   By: Corlis Leak M.D.   On: 12/10/2023 18:11   DG Thoracic Spine 2 View Result Date: 12/10/2023 CLINICAL DATA:  [id number] Pre-op exam [id number] EXAM: THORACIC SPINE 2 VIEWS COMPARISON:  10/31/2023 FINDINGS: Mild thoracic kyphosis. No fracture or dislocation. Dorsal stimulator catheters extend up to the T8 level. Sternotomy wires. AICD leads partially visualized. IMPRESSION: 1. No acute findings. 2. Dorsal stimulator catheters. Electronically Signed   By: Corlis Leak M.D.   On: 12/10/2023 18:09    ECHO TEE 04/2022: 1. Well seated Watchman device with no peri-device leak    2. Iatrogenic ASD, trivial    3. Moderate TR    4. Moderate TR    5. Normal LV function   TELEMETRY reviewed by me 12/12/2023: Not on telemetry  EKG reviewed by me: No EKG done on admission  Data reviewed by me 12/12/2023: last 24h vitals tele labs imaging I/O hospitalist progress note, neurosurgery notes  Principal Problem:   Surgical wound infection Active Problems:   Atrial fibrillation (HCC)   Chronic systolic congestive heart failure (  HCC)   Ischemic cardiomyopathy   Hypertension   History of coronary artery bypass surgery   Mild dementia (HCC)   Solitary kidney   Hyponatremia    ASSESSMENT AND PLAN:  Danny Marquez is a 78 y.o. male  with a past medical history of CAD s/p CABG, CKD, HTN and ischemic CM s/p CRT-D, EF 30%, persistent atrial fibrillation who presented to the ED on 12/10/2023 for spinal stimulator infection. Cardiology was consulted for preoperative evaluation.   # Preoperative cardiac  evaluation # Coronary artery disease s/p CABG # Ischemic cardiomyopathy s/p CRT-D # Persistent atrial fibrillation Patient with a history of coronary artery disease status post CABG as well as ischemic cardiomyopathy status post CRT-D presented for removal and washout of spinal stimulator.  No recent cardiac complaints or concerns.  Labs unremarkable. -Feeling well today with no cardiac issues postoperatively. -Recommend continuing home cardiac meds perioperatively. -Patient should follow-up with primary cardiologist within a few weeks of discharge.  Cardiology will sign off. Please haiku with questions or re-engage if needed.    This patient's plan of care was discussed and created with Dr. Darrold Junker and he is in agreement.  Signed: Gale Journey, PA-C  12/12/2023, 8:51 AM [facility] Card[facility]

## 2023-12-12 NOTE — Anesthesia Postprocedure Evaluation (Signed)
 Anesthesia Post Note  Patient: Shemar Recruitment consultant  Procedure(s) Performed: REMOVAL, SPINAL CORD STIMULATOR, THORACIC  Patient location during evaluation: PACU Anesthesia Type: General Level of consciousness: awake and alert Pain management: pain level controlled Vital Signs Assessment: post-procedure vital signs reviewed and stable Respiratory status: spontaneous breathing, nonlabored ventilation, respiratory function stable and patient connected to nasal cannula oxygen Cardiovascular status: blood pressure returned to baseline and stable Postop Assessment: no apparent nausea or vomiting Anesthetic complications: no   No notable events documented.   Last Vitals:  Vitals:   12/12/23 0330 12/12/23 0808  BP: 133/63 (!) 144/52  Pulse: 63 (!) 58  Resp: 20 17  Temp: 36.5 C 36.5 C  SpO2: 95% 99%    Last Pain:  Vitals:   12/12/23 1408  TempSrc:   PainSc: 8                  Lenard Simmer

## 2023-12-12 NOTE — Plan of Care (Signed)

## 2023-12-12 NOTE — Plan of Care (Signed)
  Problem: Health Behavior/Discharge Planning: Goal: Ability to manage health-related needs will improve Outcome: Progressing   Problem: Clinical Measurements: Goal: Diagnostic test results will improve Outcome: Progressing   Problem: Activity: Goal: Risk for activity intolerance will decrease Outcome: Progressing   Problem: Nutrition: Goal: Adequate nutrition will be maintained Outcome: Progressing   Problem: Coping: Goal: Level of anxiety will decrease Outcome: Progressing   Problem: Pain Managment: Goal: General experience of comfort will improve and/or be controlled Outcome: Progressing

## 2023-12-12 NOTE — Progress Notes (Signed)
   Neurosurgery Progress Note  History: Danny Marquez is s/p removal of SCS for infection concern.  POD1: Pt feeling ok this morning  Physical Exam: Vitals:   12/12/23 0330 12/12/23 0808  BP: 133/63 (!) 144/52  Pulse: 63 (!) 58  Resp: 20 17  Temp: 97.7 F (36.5 C) 97.7 F (36.5 C)  SpO2: 95% 99%    AA Ox3 CNI  Strength:5/5 throughout  Incisions covered with post-op dressings   Data:  Other tests/results:  Wound culture negative to date.   Assessment/Plan:  Danny Marquez is a 78 y.o with chronic pain. SCS placed 2/20 presenting with drainage from incision site and concern for infection despite antibiotics. S/P SCS removal 12/11/23 by Dr. Adriana Simas  - mobilize - pain control - ok for DVT prophylaxis - PTOT - remainder or care per ID and IM recommendations.   Manning Charity PA-C Department of Neurosurgery

## 2023-12-12 NOTE — TOC Initial Note (Signed)
 Transition of Care [facility]) - Initial/Assessment Note    Patient Details  Name: Danny Marquez MRN: [id number] Date of Birth: 1945-10-12  Transition of Care [facility]) CM/SW Contact:    Marlowe Sax, RN Phone Number: 12/12/2023, 9:05 AM  Clinical Narrative:                 Transition of Care [facility]) - Inpatient Brief Assessment   Patient Details  Name: Danny Marquez MRN: [id number] Date of Birth: 23-Dec-1945  Transition of Care [facility]) CM/SW Contact:    Marlowe Sax, RN Phone Number: 12/12/2023, 9:05 AM   Clinical Narrative:    Transition of Care Asessment: Insurance and Status: Insurance coverage has been reviewed Patient has primary care physician: Yes Home environment has been reviewed: home with wife Prior level of function:: independent Prior/Current Home Services: No current home services Social Drivers of Health Review: SDOH reviewed no interventions necessary Readmission risk has been reviewed: Yes Transition of care needs: no transition of care needs at this time         Patient Goals and CMS Choice            Expected Discharge Plan and Services                                              Prior Living Arrangements/Services                       Activities of Daily Living   ADL Screening (condition at time of admission) Independently performs ADLs?: Yes (appropriate for developmental age) Is the patient deaf or have difficulty hearing?: No Does the patient have difficulty seeing, even when wearing glasses/contacts?: No Does the patient have difficulty concentrating, remembering, or making decisions?: No  Permission Sought/Granted                  Emotional Assessment              Admission diagnosis:  Surgical wound infection [T81.49XA] Wound infection [T14.8XXA, L08.9] Patient Active Problem List   Diagnosis Date Noted   Solitary kidney 12/11/2023   Hyponatremia 12/11/2023   Mild dementia (HCC)     Surgical wound infection 12/10/2023   Back pain 10/31/2023   Aortic atherosclerosis (HCC)    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   Lumbar spondylosis 11/15/2022   Chronic radicular lumbar pain 09/27/2022   S/P fusion of sacroiliac joint (right) 09/27/2022   Chronic pain syndrome 09/27/2022   Chronic right SI joint pain 09/27/2022   Chronic back pain 03/23/2019   Coronary arteriosclerosis 03/23/2019   Gastroesophageal reflux disease 03/23/2019   Ischemic cardiomyopathy 03/23/2019   History of heart disorder 03/23/2019   History of myocardial infarction 03/23/2019   History of pancreatitis 03/23/2019   Spinal stenosis, lumbar region, 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 [place] [address] Phone: [phone] Fax: [phone]     Social Drivers of Health (SDOH) Social History: SDOH Screenings   Food Insecurity: No Food Insecurity (12/11/2023)  Housing: Unknown (12/11/2023)  Transportation Needs: No Transportation Needs (12/11/2023)  Utilities: Not At Risk (12/11/2023)  Depression (PHQ2-9): Low Risk  (06/20/2023)  Social Connections: Socially Integrated (12/11/2023)  Tobacco Use: Medium Risk (12/11/2023)   SDOH Interventions:     Readmission Risk Interventions     No data to display

## 2023-12-12 NOTE — Progress Notes (Signed)
  Progress Note   Patient: Danny Marquez ZOX:[id number] DOB: 1946/06/28 DOA: 12/10/2023     2 DOS: the patient was seen and examined on 12/12/2023   Brief hospital course: Danny Marquez is a 78 y.o. 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, HTN, solitary kidney remote history of alcohol use disorder, failed back syndrome referred from the [place] to [facility] health where he underwent laminectomy and placement of spinal stimulator on 10/2023, complicated by postoperative infection a couple weeks prior for which he completed a course of Keflex and last seen by his orthopedist on 3/26 when his incision appeared improved in appearance, who was sent in by neurosurgery due to concern for postoperative infection with plans for spinal stimulator removal and washout, which was performed 4/2.    Principal Problem:   Surgical wound infection Active Problems:   Atrial fibrillation (HCC)   Chronic systolic congestive heart failure (HCC)   Ischemic cardiomyopathy   Hypertension   History of coronary artery bypass surgery   Mild dementia (HCC)   Solitary kidney   Hyponatremia   Assessment and Plan: * Surgical wound infection S/p laminectomy and spinal stimulator 10/2023 Chronic back pain Status post removal of spinal cord stimulator, wound culture has sent out, ending culture results.  Continue vancomycin and Zosyn.  ID consult obtained.   Solitary kidney Creatinine normal   Mild dementia (HCC) Delirium precautions Continue Aricept   History of coronary artery bypass surgery Holding metoprolol due to low blood pressure Continue aspirin, Crestor   Hypertension Continue to follow.   Chronic systolic congestive heart failure (HCC) Ischemic cardiomyopathy s/p AICD Holding losartan and metoprolol torsemide due to low blood pressures Daily weights with intake and output monitoring Last echo on file is from 04/2022  showing EF over 55%, likely HFrEF with improved EF Patient has been seen by cardiology, cleared for surgery.  No exacerbation of congestive heart failure.     Chronic atrial fibrillation (HCC) Rate controlled.        Subjective:  Patient doing well today, no complaint.  Physical Exam: Vitals:   12/11/23 1551 12/11/23 1957 12/12/23 0330 12/12/23 0808  BP: 131/62 (!) 141/60 133/63 (!) 144/52  Pulse: 73 62 63 (!) 58  Resp: 18 20 20 17   Temp: 97.7 F (36.5 C) 97.8 F (36.6 C) 97.7 F (36.5 C) 97.7 F (36.5 C)  TempSrc: Oral  Oral   SpO2: 95% 97% 95% 99%  Weight:      Height:       General exam: Appears calm and comfortable  Respiratory system: Clear to auscultation. Respiratory effort normal. Cardiovascular system: S1 & S2 heard, RRR. No JVD, murmurs, rubs, gallops or clicks. No pedal edema. Gastrointestinal system: Abdomen is nondistended, soft and nontender. No organomegaly or masses felt. Normal bowel sounds heard. Central nervous system: Alert and oriented. No focal neurological deficits. Extremities: Symmetric 5 x 5 power. Skin: No rashes, lesions or ulcers Psychiatry: Judgement and insight appear normal. Mood & affect appropriate.    Data Reviewed:  Results reviewed, culture still pending.  Gram Stain negative.  Family Communication: Wife updated at bedside   Disposition: Status is: Inpatient Remains inpatient appropriate because: Severity of Disease, IV treatment.     Time spent: 35 minutes  Author: Marrion Coy, MD 12/12/2023 11:50 AM  For on call review [email or web address].

## 2023-12-13 ENCOUNTER — Encounter: Payer: Self-pay | Admitting: Neurosurgery

## 2023-12-13 DIAGNOSIS — T8149XA Infection following a procedure, other surgical site, initial encounter: Secondary | ICD-10-CM | POA: Diagnosis not present

## 2023-12-13 DIAGNOSIS — B9561 Methicillin susceptible Staphylococcus aureus infection as the cause of diseases classified elsewhere: Secondary | ICD-10-CM | POA: Diagnosis not present

## 2023-12-13 DIAGNOSIS — I10 Essential (primary) hypertension: Secondary | ICD-10-CM

## 2023-12-13 DIAGNOSIS — I4821 Permanent atrial fibrillation: Secondary | ICD-10-CM | POA: Diagnosis not present

## 2023-12-13 LAB — CREATININE, SERUM
Creatinine, Ser: 1.15 mg/dL (ref 0.61–1.24)
GFR, Estimated: >60 mL/min (ref >60)

## 2023-12-13 MED ORDER — GABAPENTIN 100 MG PO CAPS
200.0000 mg | ORAL_CAPSULE | Freq: Two times a day (BID) | ORAL | Status: DC
  Administered 2023-12-13 – 2023-12-14 (×3): 200 mg via ORAL
  Filled 2023-12-13 (×3): qty 2

## 2023-12-13 MED ORDER — QUETIAPINE FUMARATE 25 MG PO TABS
25.0000 mg | ORAL_TABLET | Freq: Every day | ORAL | Status: DC
  Administered 2023-12-13: 25 mg via ORAL
  Filled 2023-12-13: qty 1

## 2023-12-13 MED ORDER — SENNOSIDES-DOCUSATE SODIUM 8.6-50 MG PO TABS
2.0000 | ORAL_TABLET | Freq: Two times a day (BID) | ORAL | Status: DC
  Administered 2023-12-13 – 2023-12-14 (×3): 2 via ORAL
  Filled 2023-12-13 (×3): qty 2

## 2023-12-13 NOTE — Plan of Care (Signed)

## 2023-12-13 NOTE — Plan of Care (Signed)

## 2023-12-13 NOTE — Progress Notes (Signed)
 Date of Admission:  12/10/2023     ID: Danny Marquez is a 78 y.o. male Principal Problem:   Surgical wound infection Active Problems:   Atrial fibrillation (HCC)   Chronic systolic congestive heart failure (HCC)   Ischemic cardiomyopathy   Hypertension   History of coronary artery bypass surgery   Mild dementia (HCC)   Solitary kidney   Hyponatremia   Overweight (BMI 25.0-29.9)    Subjective: Pt doing well No complaints  Medications:   allopurinol  200 mg Oral Daily   aspirin EC  81 mg Oral QHS   donepezil  10 mg Oral QHS   enoxaparin (LOVENOX) injection  40 mg Subcutaneous Q24H   gabapentin  200 mg Oral BID   losartan  12.5 mg Oral Daily   melatonin  5 mg Oral QHS   metoprolol succinate  12.5 mg Oral Daily   pantoprazole  20 mg Oral QAC breakfast   QUEtiapine  25 mg Oral QHS   ranolazine  500 mg Oral BID   rosuvastatin  5 mg Oral QHS   senna-docusate  2 tablet Oral BID   sertraline  100 mg Oral Daily    Objective: Vital signs in last 24 hours: Patient Vitals for the past 24 hrs:  BP Temp Pulse Resp SpO2  12/13/23 1554 (!) 119/93 98.6 F (37 C) 63 17 96 %  12/13/23 0837 112/64 98.1 F (36.7 C) 72 20 97 %  12/13/23 0401 (!) 112/53 98 F (36.7 C) 62 18 94 %  12/12/23 2023 125/64 98.4 F (36.9 C) 65 18 96 %      PHYSICAL EXAM:  General: Alert, cooperative, no distress, appears stated age.  Lungs: Clear to auscultation bilaterally. No Wheezing or Rhonchi. No rales. Heart: Regular rate and rhythm, no murmur, rub or gallop. Abdomen: Soft, non-tender,not distended. Bowel sounds normal. No masses Extremities: atraumatic, no cyanosis. No edema. No clubbing Skin: No rashes or lesions. Or bruising Lymph: Cervical, supraclavicular normal. Neurologic: Grossly non-focal Back surgical site covered with dressing  Lab Results    Latest Ref Rng & Units 12/10/2023    4:22 PM 10/18/2023    2:51 PM 03/03/2022    2:52 AM  CBC  WBC 4.0 - 10.5 K/uL 5.3  4.7  17.2    Hemoglobin 13.0 - 17.0 g/dL 16.1  09.6  04.5   Hematocrit 39.0 - 52.0 % 41.9  40.4  38.9   Platelets 150 - 400 K/uL 261  158  205        Latest Ref Rng & Units 12/13/2023    5:26 AM 12/12/2023    3:41 AM 12/11/2023    3:43 AM  CMP  Glucose 70 - 99 mg/dL   91   BUN 8 - 23 mg/dL   12   Creatinine 4.09 - 1.24 mg/dL 8.11  9.14  7.82   Sodium 135 - 145 mmol/L   132   Potassium 3.5 - 5.1 mmol/L   4.0   Chloride 98 - 111 mmol/L   102   CO2 22 - 32 mmol/L   22   Calcium 8.9 - 10.3 mg/dL   8.8       Microbiology: Staph aureus Susceptibility pending    Assessment/Plan:  Spine stimulator site infection- s/p extraction of hardware ? Culture Staph aureus As per Manning Charity NS who communicated with Dr.Cook the culture was superficial and there was no deep infection Pt is currently on vanco and cefepime DC cefepime Can change to  PO on discharge depending on susceptibility of staph aureus for 2 weeks Wil close follow up of cmp/cbc weekly    H/o CAD s/p CABG   Congenital solitary left kidney   Afib AICD   H/o B/l TKA H/o Lumbar fusion   Discussed the management with care team

## 2023-12-13 NOTE — Progress Notes (Signed)
 Message sent to MD, incisions to back very painful and red. No dressing in place at this time. It was charted yesterday that honeycomb dressing was in place. Patient was confused during the night and pulled out two PIV accesses and may have pulled dressings off. No dressing orders in place. Nurse questioned if MD wanted to see incisions.

## 2023-12-13 NOTE — Care Management Important Message (Signed)
 Important Message  Patient Details  Name: Danny Marquez MRN: [id number] Date of Birth: [birth year]   Important Message Given:  Yes - Medicare IM     Cristela Blue, CMA 12/13/2023, 10:44 AM

## 2023-12-13 NOTE — Consult Note (Signed)
 Pharmacy Antibiotic Note  Danny Marquez is a 78 y.o. male admitted on 12/10/2023 with cellulitis. Patient underwent thoracic laminectomy for spinal cord stimulator placement on 10/31/2023. Unfortunately developed some redness at his incision site approximately 2 weeks ago and was given a week of Keflex. Initially it did appear to improve, but now it appears to be worsening with active drainage at his incision site. Pharmacy has been consulted for Vancomycin dosing  Today, 12/13/2023 Day #3 antibiotics - currently vancomycin and cefepime Renal: Scr 1.15 (at Baseline, has lone kidney) WBC 5.3 - no check since 4/1 Afebrile  4/2 removal of spinal cord stimulator and I&D 4/2 OR culture: rare S aureus (susc pending)  Plan: Continue vancomycin 1500 mg IV Q24H. Expected AUC: 514 (goal 400-600) Expected Css min: 12.1 SCr used: 1.15 Weight used: IBW, Vd used: 0.72 (BMI 26.9) Continue to monitor renal function and follow culture results - await susceptibility results If returns as MRSA, recommend checking vancomycin levels with 4/5 22:00 vancomycin dose  Height: 5\' 9"  (175.3 cm) Weight: 82.6 kg (182 lb) IBW/kg (Calculated) : 70.7  Temp (24hrs), Avg:98.2 F (36.8 C), Min:98 F (36.7 C), Max:98.4 F (36.9 C)  Recent Labs  Lab 12/10/23 1622 12/11/23 0343 12/12/23 0341 12/13/23 0526  WBC 5.3  --   --   --   CREATININE 1.21 1.07 1.14 1.15  LATICACIDVEN 0.6  --   --   --     Estimated Creatinine Clearance: 53.8 mL/min (by C-G formula based on SCr of 1.15 mg/dL).    Allergies  Allergen Reactions   Codeine Nausea Only    dizzy    Antimicrobials this admission: 4/1 Ceftriaxone x 1 4/1 Vancomycin x 7 days 4/2 Zosyn >>4/2 4/2 cefepime >>  Thank you for allowing pharmacy to be a part of this patient's care.  Juliette Alcide, PharmD, BCPS, BCIDP Work Cell: [phone] 12/13/2023 11:09 AM

## 2023-12-13 NOTE — Progress Notes (Signed)
   Neurosurgery Progress Note  History: Danny Marquez is s/p removal of SCS for infection concern.  POD2: Experiencing expected back pain this morning POD1: Pt feeling ok this morning  Physical Exam: Vitals:   12/13/23 0401 12/13/23 0837  BP: (!) 112/53 112/64  Pulse: 62 72  Resp: 18 20  Temp: 98 F (36.7 C) 98.1 F (36.7 C)  SpO2: 94% 97%    AA Ox3 CNI  Strength:5/5 throughout  Incisions open to air with some serosanguineous drainage  Data:  Other tests/results:  Culture showing rare staphylococcus aureus. Susceptibilities pending  Assessment/Plan:  Danny Marquez is a 78 y.o with chronic pain. SCS placed 2/20 presenting with drainage from incision site and concern for infection despite antibiotics. S/P SCS removal 12/11/23 by Dr. Adriana Simas  - mobilize - pain control - ok for DVT prophylaxis - PTOT - please recover incisions with dressings and replace tomorrow. - remainder or care per ID and IM recommendations.   Manning Charity PA-C Department of Neurosurgery

## 2023-12-13 NOTE — Evaluation (Signed)
 Physical Therapy Evaluation Patient Details Name: Danny Marquez MRN: [id number] DOB: 09-17-1945 Today's Date: 12/13/2023  History of Present Illness  Danny Marquez is a 77yoM who comes to [facility] on 12/10/23 on account of a concern of possible infection of spinal cord stimulator (10/31/23). Pt has been having pain at surgical site and drainage. PMH: CAD s/p CABG, AICD, AF, congenital solitary Left Kidney, low back surgery, bilat TKA. Pt went to OR with Dr. Adriana Simas for hardware removal.  Clinical Impression  Pt asleep on entry, awakens to voice, but remains somnolent and altered- wife reports a restless night and somnolence much of today, history of mental status changes with pain meds. Pt reports pain at goal. Pt able to demonstrate supervision level mobility of household distance AMB and stairs performance, needs extensive cues due to slowed processing and close supervision for safety. Wife attends session. Wife declines PT services at [place]. Will continue to follow.       If plan is discharge home, recommend the following: A little help with walking and/or transfers;Assist for transportation;Assistance with cooking/housework;Help with stairs or ramp for entrance   Can travel by private vehicle        Equipment Recommendations None recommended by PT  Recommendations for Other Services       Functional Status Assessment Patient has had a recent decline in their functional status and demonstrates the ability to make significant improvements in function in a reasonable and predictable amount of time.     Precautions / Restrictions Precautions Precautions: Fall Recall of Precautions/Restrictions: Impaired Restrictions Weight Bearing Restrictions Per Provider Order: No      Mobility  Bed Mobility Overal bed mobility: Needs Assistance Bed Mobility: Supine to Sit, Sit to Supine     Supine to sit: Supervision Sit to supine: Supervision   General bed mobility comments: needs repeated  multimodal cues due to mild AMS (pain meds)    Transfers Overall transfer level: Needs assistance Equipment used: None Transfers: Sit to/from Stand Sit to Stand: Contact guard assist           General transfer comment: no LOB, athough remains drowsy and somewhat altered    Ambulation/Gait Ambulation/Gait assistance: Contact guard assist Gait Distance (Feet): 290 Feet Assistive device: None Gait Pattern/deviations: WFL(Within Functional Limits) Gait velocity: 0.16m/s        Stairs Stairs: Yes Stairs assistance: Contact guard assist, Supervision Stair Management: Two rails Number of Stairs: 4 General stair comments: extensive cues given due to AMS  Wheelchair Mobility     Tilt Bed    Modified Rankin (Stroke Patients Only)       Balance                                             Pertinent Vitals/Pain Pain Assessment Pain Assessment: 0-10 Pain Score:  (says pain is not too bad)    Home Living Family/patient expects to be discharged to:: Private residence Living Arrangements: Spouse/significant other Available Help at Discharge: Family (son and DIL live next door) Type of Home: House Home Access: Stairs to enter Entrance Stairs-Rails: Left Entrance Stairs-Number of Steps: 2   Home Layout: One level Home Equipment: Agricultural consultant (2 wheels);Cane - single point;BSC/3in1;Shower seat;Grab bars - tub/shower Additional Comments: sleeps in a standard bed at home    Prior Function Prior Level of Function : Independent/Modified Independent (possible 1-2 fallsin last 6  months in yard)             Mobility Comments: pain limited; household AMB primarily ADLs Comments: independent     Extremity/Trunk Assessment                Communication        Cognition Arousal: Lethargic, Suspect due to medications Behavior During Therapy: WFL for tasks assessed/performed                                     Cueing        General Comments      Exercises     Assessment/Plan    PT Assessment Patient needs continued PT services  PT Problem List Decreased activity tolerance;Decreased balance;Decreased mobility       PT Treatment Interventions Neuromuscular re-education;DME instruction;Patient/family education;Stair training;Functional mobility training;Therapeutic activities;Therapeutic exercise;Balance training    PT Goals (Current goals can be found in the Care Plan section)  Acute Rehab PT Goals Patient Stated Goal: regain safe independent mobility PT Goal Formulation: With family Time For Goal Achievement: 12/27/23 Potential to Achieve Goals: Good    Frequency Min 3X/week     Co-evaluation               AM-PAC PT "6 Clicks" Mobility  Outcome Measure Help needed turning from your back to your side while in a flat bed without using bedrails?: None Help needed moving from lying on your back to sitting on the side of a flat bed without using bedrails?: None Help needed moving to and from a bed to a chair (including a wheelchair)?: A Little Help needed standing up from a chair using your arms (e.g., wheelchair or bedside chair)?: A Little Help needed to walk in hospital room?: A Little Help needed climbing 3-5 steps with a railing? : A Little 6 Click Score: 20    End of Session Equipment Utilized During Treatment: Gait belt Activity Tolerance: Patient tolerated treatment well;No increased pain;Patient limited by lethargy Patient left: in bed;with call bell/phone within reach;with family/visitor present;with nursing/sitter in room Nurse Communication: Mobility status PT Visit Diagnosis: Unsteadiness on feet (R26.81);Difficulty in walking, not elsewhere classified (R26.2);Other symptoms and signs involving the nervous system (R29.898)    Time: 1478-2956 PT Time Calculation (min) (ACUTE ONLY): 19 min   Charges:   PT Evaluation $PT Eval Moderate Complexity: 1 Mod PT  Treatments $Gait Training: 8-22 mins PT General Charges $$ ACUTE PT VISIT: 1 Visit        4:14 PM, 12/13/23 Rosamaria Lints, PT, DPT Physical Therapist - [facility]  [phone] (ASCOM)    Coda Mathey C 12/13/2023, 4:11 PM

## 2023-12-13 NOTE — Progress Notes (Signed)
 Dressing orders placed by Manning Charity, PA. Nurse cleansed incisions and placed honeycomb dressings.

## 2023-12-13 NOTE — Progress Notes (Signed)
 Progress Note   Patient: Danny Marquez ZOX:[id number] DOB: 04-07-1946 DOA: 12/10/2023     3 DOS: the patient was seen and examined on 12/13/2023   Brief hospital course: Danny Marquez is a 78 y.o. 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 health where he underwent laminectomy and placement of spinal stimulator on 10/2023, complicated by postoperative infection a couple weeks prior for which he completed a course of Keflex and last seen by his orthopedist on 3/26 when his incision appeared improved in appearance, who was sent in by neurosurgery due to concern for postoperative infection with plans for spinal stimulator removal and washout, which was performed 4/2.    Principal Problem:   Surgical wound infection Active Problems:   Atrial fibrillation (HCC)   Chronic systolic congestive heart failure (HCC)   Ischemic cardiomyopathy   Hypertension   History of coronary artery bypass surgery   Mild dementia (HCC)   Solitary kidney   Hyponatremia   Overweight (BMI 25.0-29.9)   Assessment and Plan:  Surgical wound infection S/p laminectomy and spinal stimulator 10/2023 Chronic back pain Status post removal of spinal cord stimulator, wound culture growth sparse Staph aureus, seen by ID, antibiotic changed to cefepime and vancomycin.   Solitary kidney Creatinine normal   Mild dementia [facility] Hospital delirium. Could not sleep at nighttime, agitated pulling out IV.  On trazodone and melatonin, will discontinue trazodone and changed to Seroquel.  Continue to follow.   History of coronary artery bypass surgery Holding metoprolol due to low blood pressure Continue aspirin, Crestor   Hypertension Continue to follow.   Chronic systolic congestive heart failure (HCC) Ischemic cardiomyopathy  s/p AICD Holding losartan and metoprolol torsemide due to low blood pressures Daily weights with intake and output monitoring Last echo on file is from 04/2022 showing EF over 55%, likely HFrEF with improved EF Patient has been seen by cardiology, cleared for surgery.  No exacerbation of congestive heart failure.     Chronic atrial fibrillation (HCC) Rate controlled.        Subjective:  Patient was agitated last night could not sleep.  Has some confusion today.  Physical Exam: Vitals:   12/12/23 0808 12/12/23 2023 12/13/23 0401 12/13/23 0837  BP: (!) 144/52 125/64 (!) 112/53 112/64  Pulse: (!) 58 65 62 72  Resp: 17 18 18 20   Temp: 97.7 F (36.5 C) 98.4 F (36.9 C) 98 F (36.7 C) 98.1 F (36.7 C)  TempSrc:      SpO2: 99% 96% 94% 97%  Weight:      Height:       General exam: Appears calm and comfortable  Respiratory system: Clear to auscultation. Respiratory effort normal. Cardiovascular system: Appear regular.  No JVD, murmurs, rubs, gallops or clicks. No pedal edema. Gastrointestinal system: Abdomen is nondistended, soft and nontender. No organomegaly or masses felt. Normal bowel sounds heard. Central nervous system: Alert and oriented x2. No focal neurological deficits. Extremities: Symmetric 5 x 5 power. Skin: No rashes, lesions or ulcers Psychiatry: Mood & affect appropriate.    Data Reviewed:  There are no new results to review at this time.  Family Communication: Wife updated at bedside.  Disposition: Status is: Inpatient Remains inpatient appropriate because: Severity of disease, IV treatment.     Time spent: 35 minutes  Author: Marrion Coy, MD 12/13/2023 1:52  PM  For on call review [email or web address].

## 2023-12-14 DIAGNOSIS — I4821 Permanent atrial fibrillation: Secondary | ICD-10-CM | POA: Diagnosis not present

## 2023-12-14 DIAGNOSIS — T8149XA Infection following a procedure, other surgical site, initial encounter: Secondary | ICD-10-CM | POA: Diagnosis not present

## 2023-12-14 DIAGNOSIS — I5022 Chronic systolic (congestive) heart failure: Secondary | ICD-10-CM | POA: Diagnosis not present

## 2023-12-14 LAB — CREATININE, SERUM
Creatinine, Ser: 1.17 mg/dL (ref 0.61–1.24)
GFR, Estimated: >60 mL/min (ref >60)

## 2023-12-14 MED ORDER — QUETIAPINE FUMARATE 25 MG PO TABS: 25.0000 mg | ORAL_TABLET | Freq: Every day | ORAL | 0 refills | Status: DC

## 2023-12-14 MED ORDER — DOXYCYCLINE HYCLATE 100 MG PO TABS: 100.0000 mg | ORAL_TABLET | Freq: Two times a day (BID) | ORAL | 0 refills | Status: AC

## 2023-12-14 NOTE — Progress Notes (Signed)
 Occupational Therapy Evaluation Patient Details Name: Danny Marquez MRN: [id number] DOB: 03-04-1946 Today's Date: 12/14/2023   History of Present Illness   Danny Marquez is a 77yoM who comes to [facility] on 12/10/23 on account of a concern of possible infection of spinal cord stimulator (10/31/23). Pt has been having pain at surgical site and drainage. PMH: CAD s/p CABG, AICD, AF, congenital solitary Left Kidney, low back surgery, bilat TKA. Pt went to OR with Dr. Adriana Simas for hardware removal.     Clinical Impressions Pt was seen for OT evaluation this date. Prior to hospital admission, pt was mod indep in most ADL/IADL, pain limited with assistance from wife PRN. Pt lives in one level home with two steps to enter. Pt presents to acute OT demonstrating impaired ADL performance and functional mobility 2/2  (See OT problem list for additional functional deficits). Pt currently requires supervision for bed mobility, CGA throughout amb during session. Pt amb to BR with CGA and no DME use, min verbal cues for sequencing due to AME from meds. Wife/Pt reports increased cognition on this date from previous days within this hospital stay due to medication. Pt would benefit from skilled OT services to address noted impairments and functional limitations (see below for any additional details) in order to maximize safety and independence while minimizing falls risk and caregiver burden. Wife and Pt politely decline home health services at discharge at this time, despite verbal encouragement. Pt and wife educated on fall prevention strategies, and safe ADL completion with DME as needed. OT will follow acutely.     If plan is discharge home, recommend the following:   A little help with walking and/or transfers;A little help with bathing/dressing/bathroom;Assistance with cooking/housework;Direct supervision/assist for medications management;Direct supervision/assist for financial management;Assist for  transportation;Help with stairs or ramp for entrance     Functional Status Assessment   Patient has had a recent decline in their functional status and demonstrates the ability to make significant improvements in function in a reasonable and predictable amount of time.     Equipment Recommendations   None recommended by OT     Recommendations for Other Services         Precautions/Restrictions   Precautions Precautions: Fall Recall of Precautions/Restrictions: Impaired Restrictions Weight Bearing Restrictions Per Provider Order: No     Mobility Bed Mobility Overal bed mobility: Needs Assistance Bed Mobility: Supine to Sit, Sit to Supine     Supine to sit: Supervision Sit to supine: Supervision   General bed mobility comments: MINA verbal cues for sequencing, slightly impulsive during STS from EOB    Transfers Overall transfer level: Needs assistance Equipment used: None Transfers: Sit to/from Stand Sit to Stand: Contact guard assist           General transfer comment: Amb to BR with CGA + no DME, min verbal cues for sequencing due to AME from meds      Balance Overall balance assessment: Needs assistance Sitting-balance support: No upper extremity supported, Feet unsupported Sitting balance-Leahy Scale: Normal     Standing balance support: Single extremity supported, During functional activity Standing balance-Leahy Scale: Fair                             ADL either performed or assessed with clinical judgement   ADL Overall ADL's : Needs assistance/impaired Eating/Feeding: Independent;Sitting   Grooming: Wash/dry hands;Standing;Cueing for sequencing  Lower Body Dressing: Modified independent;Sit to/from stand   Toilet Transfer: Contact guard assist;Ambulation;Grab bars   Toileting- Clothing Manipulation and Hygiene: Contact guard assist       Functional mobility during ADLs: Contact guard assist;Cueing  for sequencing General ADL Comments: Pt completed toileting in standing with CGA, pt required single extermity support during toileting to remain stable. Pt complete hand washing with verbal cues for sequencing task, noted extra processing time needed.     Vision Baseline Vision/History: 1 Wears glasses                         Pertinent Vitals/Pain Pain Assessment Pain Assessment: 0-10 Pain Score: 5  Pain Location: Back Pain Descriptors / Indicators: Tender Pain Intervention(s): Monitored during session, Limited activity within patient's tolerance, Repositioned     Extremity/Trunk Assessment Upper Extremity Assessment Upper Extremity Assessment: Overall WFL for tasks assessed   Lower Extremity Assessment Lower Extremity Assessment: Generalized weakness;Overall [facility] for tasks assessed   Cervical / Trunk Assessment Cervical / Trunk Assessment: Normal   Communication Communication Communication: No apparent difficulties   Cognition Arousal: Alert Behavior During Therapy: WFL for tasks assessed/performed Cognition: Cognition impaired     Awareness: Intellectual awareness intact     Executive functioning impairment (select all impairments): Organization, Sequencing, Problem solving, Initiation OT - Cognition Comments: Pt's wife reports is thinking has improved greatly since yesterday. Pt a/ox4 throughout session. Extra time for processing at times, Difficulty following multi-step commands, extra time required.                 Following commands: Impaired Following commands impaired: Follows multi-step commands with increased time     Cueing  General Comments   Cueing Techniques: Verbal cues;Tactile cues  Pt eager to return home with wife   Exercises Exercises: Other exercises Other Exercises Other Exercises: Edu: Role of OT in rehab setting, safe ADL completion at d/c, fall prevention   Shoulder Instructions      Home Living Family/patient expects to  be discharged to:: Private residence Living Arrangements: Spouse/significant other Available Help at Discharge: Family Type of Home: House Home Access: Stairs to enter Secretary/administrator of Steps: 2 Entrance Stairs-Rails: Left Home Layout: One level     Bathroom Shower/Tub: Producer, television/film/video: Handicapped height Bathroom Accessibility: Yes How Accessible: Accessible via walker Home Equipment: Agricultural consultant (2 wheels);Cane - single point;BSC/3in1;Shower seat;Grab bars - tub/shower   Additional Comments: sleeps in a standard bed at home      Prior Functioning/Environment Prior Level of Function : Independent/Modified Independent             Mobility Comments: PTA pt was pain limited; household AMB primarily ADLs Comments: independent    OT Problem List: Decreased strength;Decreased activity tolerance;Impaired balance (sitting and/or standing);Decreased coordination;Decreased cognition;Decreased safety awareness;Decreased knowledge of use of DME or AE   OT Treatment/Interventions: Self-care/ADL training;Therapeutic exercise;DME and/or AE instruction;Therapeutic activities;Patient/family education;Balance training      OT Goals(Current goals can be found in the care plan section)   Acute Rehab OT Goals Patient Stated Goal: return home OT Goal Formulation: With patient/family Time For Goal Achievement: 12/28/23 Potential to Achieve Goals: Good ADL Goals Pt Will Perform Grooming: standing;with modified independence Pt Will Perform Lower Body Dressing: with modified independence;sit to/from stand Pt Will Transfer to Toilet: with modified independence;regular height toilet;ambulating Pt Will Perform Toileting - Clothing Manipulation and hygiene: with modified independence   OT Frequency:  Min 1X/week  Co-evaluation              AM-PAC OT "6 Clicks" Daily Activity     Outcome Measure Help from another person eating meals?: None Help from  another person taking care of personal grooming?: A Little Help from another person toileting, which includes using toliet, bedpan, or urinal?: A Little Help from another person bathing (including washing, rinsing, drying)?: A Little Help from another person to put on and taking off regular upper body clothing?: None Help from another person to put on and taking off regular lower body clothing?: None 6 Click Score: 21   End of Session Equipment Utilized During Treatment: Gait belt  Activity Tolerance: Patient tolerated treatment well Patient left: in bed;with bed alarm set;with call bell/phone within reach;with family/visitor present  OT Visit Diagnosis: Unsteadiness on feet (R26.81);Other abnormalities of gait and mobility (R26.89);Repeated falls (R29.6);Muscle weakness (generalized) (M62.81)                Time: 0454-0981 OT Time Calculation (min): 18 min Charges:  OT General Charges $OT Visit: 1 Visit OT Evaluation $OT Eval Moderate Complexity: 1 Mod OT Treatments $Self Care/Home Management : 8-22 mins  Glenard Haring M.S. OTR/L  12/14/23, 12:29 PM

## 2023-12-14 NOTE — Plan of Care (Signed)

## 2023-12-14 NOTE — Discharge Summary (Addendum)
 Physician Discharge Summary   Patient: Danny Marquez MRN: [id number] DOB: 06-24-1946  Admit date:     12/10/2023  Discharge date: 12/14/23  Discharge Physician: Danny Marquez   PCP: [facility]   Recommendations at discharge:   Follow-up with PCP in 1 week. Follow-up with cardiology as scheduled.  Discharge Diagnoses: Principal Problem:   Surgical wound infection Active Problems:   Atrial fibrillation (HCC)   Chronic systolic congestive heart failure (HCC)   Ischemic cardiomyopathy   Hypertension   History of coronary artery bypass surgery   Mild dementia (HCC)   Solitary kidney   Hyponatremia   Overweight (BMI 25.0-29.9)  Resolved Problems:   * No resolved hospital problems. *  Hospital Course: Danny Marquez is a 78 y.o. 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, HTN, solitary kidney remote history of alcohol use disorder, failed back syndrome referred from the [place] to [facility] health where he underwent laminectomy and placement of spinal stimulator on 10/2023, complicated by postoperative infection a couple weeks prior for which he completed a course of Keflex and last seen by his orthopedist on 3/26 when his incision appeared improved in appearance, who was sent in by neurosurgery due to concern for postoperative infection with plans for spinal stimulator removal and washout, which was performed 4/2.  Final culture came back with MRSA, susceptible to doxycycline.  Discussed with Dr. Drue Marquez, okay to treat with doxycycline for 2 weeks.  Assessment and Plan:  Surgical wound infection secondary to MRSA. S/p laminectomy and spinal stimulator 10/2023  Status post spinal stimulator removal. Chronic back pain Status post removal of spinal cord stimulator, wound culture growth sparse Staph aureus, seen by ID, antibiotic changed to cefepime and vancomycin. Final culture came back, decision was  made to treat with doxycycline for 2 weeks.   Solitary kidney Creatinine normal   Mild dementia [facility]) Hospital delirium. Condition improved after a good night sleep.   History of coronary artery bypass surgery Holding metoprolol due to low blood pressure Continue aspirin, Crestor   Hypertension Continue to follow.   Chronic systolic congestive heart failure (HCC) Ischemic cardiomyopathy s/p AICD Holding losartan and metoprolol torsemide due to low blood pressures Daily weights with intake and output monitoring Last echo on file is from 04/2022 showing EF over 55%, likely HFrEF with improved EF Patient has been seen by cardiology, cleared for surgery.  No exacerbation of congestive heart failure.     Chronic atrial fibrillation (HCC) Rate controlled.         Consultants: Neurosurgery, ID. Procedures performed: Spinal stimulator removal. Disposition: Home Diet recommendation:  Discharge Diet Orders (From admission, onward)     Start     Ordered   12/14/23 0000  Diet - low sodium heart healthy        12/14/23 1025           Cardiac diet DISCHARGE MEDICATION: Allergies as of 12/14/2023       Reactions   Codeine Nausea Only   dizzy        Medication List     STOP taking these medications    traZODone 50 MG tablet Commonly known as: DESYREL       TAKE these medications    allopurinol 100 MG tablet Commonly known as: ZYLOPRIM Take 200 mg by mouth in the morning.   aspirin EC 81 MG tablet Take 81 mg by mouth in the morning. Swallow whole.  donepezil 10 MG tablet Commonly known as: ARICEPT Take 10 mg by mouth at bedtime.   doxycycline 100 MG tablet Commonly known as: VIBRA-TABS Take 1 tablet (100 mg total) by mouth 2 (two) times daily for 14 days.   empagliflozin 25 MG Tabs tablet Commonly known as: JARDIANCE Take 12.5 mg by mouth in the morning.   losartan 25 MG tablet Commonly known as: COZAAR Take 12.5 mg by mouth in the morning.    metoprolol succinate 25 MG 24 hr tablet Commonly known as: TOPROL-XL Take 12.5 mg by mouth in the morning.   oxyCODONE 5 MG immediate release tablet Commonly known as: Roxicodone Take 1 tablet (5 mg total) by mouth every 4 (four) hours as needed for severe pain (pain score 7-10).   pregabalin 150 MG capsule Commonly known as: LYRICA Take 150 mg by mouth 2 (two) times daily.   Protonix 20 MG tablet Generic drug: pantoprazole Take 20 mg by mouth daily before breakfast.   QUEtiapine 25 MG tablet Commonly known as: SEROQUEL Take 1 tablet (25 mg total) by mouth at bedtime.   ranolazine 500 MG 12 hr tablet Commonly known as: RANEXA Take 500 mg by mouth 2 (two) times daily.   rosuvastatin 5 MG tablet Commonly known as: CRESTOR Take 5 mg by mouth at bedtime.   sertraline 100 MG tablet Commonly known as: ZOLOFT Take 100 mg by mouth in the morning.   torsemide 20 MG tablet Commonly known as: DEMADEX Take 20 mg by mouth daily as needed (fluid retention/swelling).               Discharge Care Instructions  (From admission, onward)           Start     Ordered   12/14/23 0000  Discharge wound care:       Comments: Follow with pcp   12/14/23 1025            Follow-up Information     Danny Madura, MD. Go in 1 week(s).   Specialty: Cardiology Contact information: [address] [facility] [address] [facility] [address] [place] [address] [phone]         [facility] Follow up in 1 week(s).   Specialty: General Practice Contact information: [address] [place] [address] [phone]                Discharge Exam: Danny Marquez Weights   12/10/23 1623 12/11/23 0959  Weight: 82.6 kg 82.6 kg   General exam: Appears calm and comfortable  Respiratory system: Clear to auscultation. Respiratory effort normal. Cardiovascular system: Appear regular. No JVD, murmurs, rubs, gallops or clicks. No pedal edema. Gastrointestinal system:  Abdomen is nondistended, soft and nontender. No organomegaly or masses felt. Normal bowel sounds heard. Central nervous system: Alert and oriented. No focal neurological deficits. Extremities: Symmetric 5 x 5 power. Skin: No rashes, lesions or ulcers Psychiatry: Judgement and insight appear normal. Mood & affect appropriate.    Condition at discharge: good  The results of significant diagnostics from this hospitalization (including imaging, microbiology, ancillary and laboratory) are listed below for reference.   Imaging Studies: DG Lumbar Spine 2-3 Views Result Date: 12/10/2023 CLINICAL DATA:  Spinal stimulator, low back pain EXAM: LUMBAR SPINE - 2-3 VIEW COMPARISON:  09/27/2022 FINDINGS: Post instrumented fusion L4-S1. Stable hardware across the right sacroiliac joint. Implanted generator is noted, cephalad extent of lead not visualized on this study. No fracture or dislocation. Mild narrowing of interspaces L2-L4. Aortic Atherosclerosis (ICD10-170.0).  IMPRESSION: 1. No acute findings. 2. Post   fusion L4-S1. Electronically Signed   By: Corlis Leak M.D.   On: 12/10/2023 18:11   DG Thoracic Spine 2 View Result Date: 12/10/2023 CLINICAL DATA:  [id number] Pre-op exam [id number] EXAM: THORACIC SPINE 2 VIEWS COMPARISON:  10/31/2023 FINDINGS: Mild thoracic kyphosis. No fracture or dislocation. Dorsal stimulator catheters extend up to the T8 level. Sternotomy wires. AICD leads partially visualized. IMPRESSION: 1. No acute findings. 2. Dorsal stimulator catheters. Electronically Signed   By: Corlis Leak M.D.   On: 12/10/2023 18:09    Microbiology: Results for orders placed or performed during the hospital encounter of 12/10/23  Aerobic/Anaerobic Culture w Gram Stain (surgical/deep wound)     Status: None (Preliminary result)   Collection Time: 12/11/23 11:35 AM   Specimen: Path Tissue  Result Value Ref Range Status   Specimen Description WOUND  Final   Special Requests BACK  Final   Gram Stain   Final     NO WBC SEEN NO ORGANISMS SEEN Performed at [facility], [address], [place], [place] [address]    Culture   Final    RARE METHICILLIN RESISTANT STAPHYLOCOCCUS AUREUS NO ANAEROBES ISOLATED; CULTURE IN PROGRESS FOR 5 DAYS    Report Status PENDING  Incomplete   Organism ID, Bacteria METHICILLIN RESISTANT STAPHYLOCOCCUS AUREUS  Final      Susceptibility   Methicillin resistant staphylococcus aureus - MIC*    CIPROFLOXACIN >=8 RESISTANT Resistant     ERYTHROMYCIN >=8 RESISTANT Resistant     GENTAMICIN <=0.5 SENSITIVE Sensitive     OXACILLIN >=4 RESISTANT Resistant     TETRACYCLINE <=1 SENSITIVE Sensitive     VANCOMYCIN 1 SENSITIVE Sensitive     TRIMETH/SULFA <=10 SENSITIVE Sensitive     CLINDAMYCIN <=0.25 SENSITIVE Sensitive     RIFAMPIN <=0.5 SENSITIVE Sensitive     Inducible Clindamycin NEGATIVE Sensitive     LINEZOLID 2 SENSITIVE Sensitive     * RARE METHICILLIN RESISTANT STAPHYLOCOCCUS AUREUS    Labs: CBC: Recent Labs  Lab 12/10/23 1622  WBC 5.3  NEUTROABS 3.5  HGB 13.0  HCT 41.9  MCV 89.3  PLT 261   Basic Metabolic Panel: Recent Labs  Lab 12/10/23 1622 12/11/23 0343 12/12/23 0341 12/13/23 0526 12/14/23 0516  NA 138 132*  --   --   --   K 4.4 4.0  --   --   --   CL 106 102  --   --   --   CO2 26 22  --   --   --   GLUCOSE 101* 91  --   --   --   BUN 14 12  --   --   --   CREATININE 1.21 1.07 1.14 1.15 1.17  CALCIUM 9.8 8.8*  --   --   --    Liver Function Tests: No results for input(s): "AST", "ALT", "ALKPHOS", "BILITOT", "PROT", "ALBUMIN" in the last 168 hours. CBG: No results for input(s): "GLUCAP" in the last 168 hours.  Discharge time spent: greater than 30 minutes.  Signed: Marrion Coy, MD [facility] 12/14/2023

## 2023-12-14 NOTE — Progress Notes (Signed)
 Previous nurse reports heart rate in the 40's, MD messaged to see if patient should be on telemetry monitor. Will continue to monitor.

## 2023-12-16 LAB — AEROBIC/ANAEROBIC CULTURE W GRAM STAIN (SURGICAL/DEEP WOUND): Gram Stain: NONE SEEN

## 2023-12-23 ENCOUNTER — Ambulatory Visit (INDEPENDENT_AMBULATORY_CARE_PROVIDER_SITE_OTHER): Admitting: Physician Assistant

## 2023-12-23 ENCOUNTER — Encounter: Payer: Self-pay | Admitting: Physician Assistant

## 2023-12-23 VITALS — BP 120/64 | Temp 97.6°F | Ht 69.0 in | Wt 182.0 lb

## 2023-12-23 DIAGNOSIS — M961 Postlaminectomy syndrome, not elsewhere classified: Secondary | ICD-10-CM

## 2023-12-23 DIAGNOSIS — T8149XA Infection following a procedure, other surgical site, initial encounter: Secondary | ICD-10-CM

## 2023-12-23 DIAGNOSIS — Z09 Encounter for follow-up examination after completed treatment for conditions other than malignant neoplasm: Secondary | ICD-10-CM

## 2023-12-23 MED ORDER — OXYCODONE-ACETAMINOPHEN 10-325 MG PO TABS: 1.0000 | ORAL_TABLET | Freq: Two times a day (BID) | ORAL | 0 refills | Status: DC | PRN

## 2023-12-23 NOTE — Progress Notes (Unsigned)
   REFERRING PHYSICIAN:  [facility] [address] [place],  [place] [address]  DOS: ***  HISTORY OF PRESENT ILLNESS: Danny Marquez is approximately *** status post ***. he is doing well. ***  PHYSICAL EXAMINATION:  General: Patient is well developed, well nourished, calm, collected, and in no apparent distress.   NEUROLOGICAL:  General: In no acute distress.   Awake, alert, oriented to person, place, and time.  Pupils equal round and reactive to light.  Facial tone is symmetric.  Tongue protrusion is midline.  There is no pronator drift.   Strength:            Side Iliopsoas Quads Hamstring PF DF EHL  R 5 5 5 5 5 5   L 5 5 5 5 5 5    Incision c/d/i   ROS (Neurologic):  Negative except as noted above  IMAGING: ***  ASSESSMENT/PLAN:  Danny Marquez is doing well approximately *** after ***. he will follow up ***  ***  I have advised the patient to lift up to 10 pounds until 6 weeks after surgery, then increase up to 25 pounds until 12 weeks after surgery.  After 12 weeks post-op, the patient advised to increase activity as tolerated.  Advised to contact the office if any questions or concerns arise.  Ludwig Safer PA-C Department of neurosurgery

## 2023-12-24 ENCOUNTER — Telehealth: Payer: Self-pay

## 2023-12-24 NOTE — Telephone Encounter (Signed)
 Cornel Diesel said that she is on her way to pick it up.

## 2023-12-24 NOTE — Telephone Encounter (Signed)
 Can you reach out to the patient and see if he was able to get his prescription of Oxycodone that was sent yesterday?

## 2023-12-24 NOTE — Telephone Encounter (Signed)
 Noted.

## 2023-12-25 ENCOUNTER — Other Ambulatory Visit: Payer: Self-pay | Admitting: Physician Assistant

## 2023-12-25 DIAGNOSIS — T8149XA Infection following a procedure, other surgical site, initial encounter: Secondary | ICD-10-CM

## 2023-12-25 MED ORDER — SULFAMETHOXAZOLE-TRIMETHOPRIM 800-160 MG PO TABS: 1.0000 | ORAL_TABLET | Freq: Two times a day (BID) | ORAL | 0 refills | Status: AC

## 2023-12-25 NOTE — Progress Notes (Signed)
 Spoke with patient's wife regarding sensitivities regarding the culture that was resulted.  I have contacted the infectious disease doctor.  Plan to change patient from doxycycline to Bactrim twice daily for 1 week.  Would also like to get a CBC and a BMP next week to ensure labs are well-appearing.  All questions were answered and concerns addressed.

## 2023-12-31 ENCOUNTER — Other Ambulatory Visit
Admission: RE | Admit: 2023-12-31 | Discharge: 2023-12-31 | Disposition: A | Discharge status: Home / Self Care | Source: Ambulatory Visit | Attending: Physician Assistant | Admitting: Physician Assistant

## 2023-12-31 DIAGNOSIS — T8149XA Infection following a procedure, other surgical site, initial encounter: Secondary | ICD-10-CM | POA: Diagnosis present

## 2023-12-31 LAB — BASIC METABOLIC PANEL WITH GFR
Anion gap: 7 (ref 5–15)
BUN: 19 mg/dL (ref 8–23)
CO2: 26 mmol/L (ref 22–32)
Calcium: 10.1 mg/dL (ref 8.9–10.3)
Chloride: 103 mmol/L (ref 98–111)
Creatinine, Ser: 1.53 mg/dL — ABNORMAL HIGH (ref 0.61–1.24)
GFR, Estimated: 46 mL/min — ABNORMAL LOW (ref >60)
Glucose, Bld: 107 mg/dL — ABNORMAL HIGH (ref 70–99)
Potassium: 5.4 mmol/L — ABNORMAL HIGH (ref 3.5–5.1)
Sodium: 136 mmol/L (ref 135–145)

## 2023-12-31 LAB — CBC WITH DIFFERENTIAL/PLATELET
Abs Immature Granulocytes: 0.01 10*3/uL (ref 0.00–0.07)
Basophils Absolute: 0 10*3/uL (ref 0.0–0.1)
Basophils Relative: 1 %
Eosinophils Absolute: 0.4 10*3/uL (ref 0.0–0.5)
Eosinophils Relative: 8 %
HCT: 39.6 % (ref 39.0–52.0)
Hemoglobin: 12.6 g/dL — ABNORMAL LOW (ref 13.0–17.0)
Immature Granulocytes: 0 %
Lymphocytes Relative: 21 %
Lymphs Abs: 1.1 10*3/uL (ref 0.7–4.0)
MCH: 27.3 pg (ref 26.0–34.0)
MCHC: 31.8 g/dL (ref 30.0–36.0)
MCV: 85.7 fL (ref 80.0–100.0)
Monocytes Absolute: 0.4 10*3/uL (ref 0.1–1.0)
Monocytes Relative: 7 %
Neutro Abs: 3.2 10*3/uL (ref 1.7–7.7)
Neutrophils Relative %: 63 %
Platelets: 160 10*3/uL (ref 150–400)
RBC: 4.62 MIL/uL (ref 4.22–5.81)
RDW: 15.6 % — ABNORMAL HIGH (ref 11.5–15.5)
WBC: 5 10*3/uL (ref 4.0–10.5)
nRBC: 0 % (ref 0.0–0.2)

## 2024-01-03 ENCOUNTER — Telehealth (INDEPENDENT_AMBULATORY_CARE_PROVIDER_SITE_OTHER): Payer: Self-pay | Admitting: Physician Assistant

## 2024-01-03 DIAGNOSIS — E875 Hyperkalemia: Secondary | ICD-10-CM

## 2024-01-03 DIAGNOSIS — R7989 Other specified abnormal findings of blood chemistry: Secondary | ICD-10-CM

## 2024-01-03 DIAGNOSIS — T8149XA Infection following a procedure, other surgical site, initial encounter: Secondary | ICD-10-CM

## 2024-01-03 NOTE — Telephone Encounter (Signed)
  Called patient regarding recent CMP results which show increasing creatinine as well as slight hyperkalemia.  I reached out to patient's infectious disease physician regarding current antibiotic plan.  Patient finished Bactrim  yesterday.  Patient has dementia and therefore I spoke with his wife over the phone.  She states that she feels as though the area of his incision site that was felt to be infected has decrease in size in terms of swelling and redness.  She denies any active drainage or fever.  She states that her husband has had no chest pains, palpitations, nausea, vomiting.  She states that he has been dizzy frequently since prior to surgery, but has not had any acute change.  I discussed with her the importance of stopping antibiotics and rechecking his wound and labs.  Plan to bring him back in clinic on Monday for repeat labs and a wound check.  If he were to experience any chest pain, palpitations, nausea, vomiting, or any other adverse side effects that we discussed I advised him to go to the emergency department.  I encouraged him to reach out to me for questions or concerns that they have in the meantime.     CMP     Component Value Date/Time   NA 136 12/31/2023 1635   K 5.4 (H) 12/31/2023 1635   CL 103 12/31/2023 1635   CO2 26 12/31/2023 1635   GLUCOSE 107 (H) 12/31/2023 1635   BUN 19 12/31/2023 1635   CREATININE 1.53 (H) 12/31/2023 1635   CALCIUM  10.1 12/31/2023 1635   PROT 6.1 (L) 03/03/2022 0252   ALBUMIN 3.1 (L) 03/03/2022 0252   AST 45 (H) 03/03/2022 0252   ALT 17 03/03/2022 0252   ALKPHOS 85 03/03/2022 0252   BILITOT 0.8 03/03/2022 0252   GFRNONAA 46 (L) 12/31/2023 1635

## 2024-01-06 ENCOUNTER — Telehealth: Payer: Self-pay | Admitting: Physician Assistant

## 2024-01-06 ENCOUNTER — Other Ambulatory Visit
Admission: RE | Admit: 2024-01-06 | Discharge: 2024-01-06 | Disposition: A | Discharge status: Home / Self Care | Source: Ambulatory Visit | Attending: Physician Assistant | Admitting: Physician Assistant

## 2024-01-06 ENCOUNTER — Ambulatory Visit (INDEPENDENT_AMBULATORY_CARE_PROVIDER_SITE_OTHER): Admitting: Physician Assistant

## 2024-01-06 VITALS — BP 118/78 | Temp 97.6°F | Ht 69.0 in | Wt 182.0 lb

## 2024-01-06 DIAGNOSIS — R7989 Other specified abnormal findings of blood chemistry: Secondary | ICD-10-CM | POA: Diagnosis present

## 2024-01-06 DIAGNOSIS — E875 Hyperkalemia: Secondary | ICD-10-CM | POA: Insufficient documentation

## 2024-01-06 DIAGNOSIS — T8149XA Infection following a procedure, other surgical site, initial encounter: Secondary | ICD-10-CM | POA: Insufficient documentation

## 2024-01-06 DIAGNOSIS — Z09 Encounter for follow-up examination after completed treatment for conditions other than malignant neoplasm: Secondary | ICD-10-CM

## 2024-01-06 DIAGNOSIS — Z905 Acquired absence of kidney: Secondary | ICD-10-CM

## 2024-01-06 DIAGNOSIS — M961 Postlaminectomy syndrome, not elsewhere classified: Secondary | ICD-10-CM

## 2024-01-06 LAB — CBC WITH DIFFERENTIAL/PLATELET
Abs Immature Granulocytes: 0.01 10*3/uL (ref 0.00–0.07)
Basophils Absolute: 0 10*3/uL (ref 0.0–0.1)
Basophils Relative: 1 %
Eosinophils Absolute: 0.5 10*3/uL (ref 0.0–0.5)
Eosinophils Relative: 10 %
HCT: 41.6 % (ref 39.0–52.0)
Hemoglobin: 13.1 g/dL (ref 13.0–17.0)
Immature Granulocytes: 0 %
Lymphocytes Relative: 22 %
Lymphs Abs: 1 10*3/uL (ref 0.7–4.0)
MCH: 27.2 pg (ref 26.0–34.0)
MCHC: 31.5 g/dL (ref 30.0–36.0)
MCV: 86.5 fL (ref 80.0–100.0)
Monocytes Absolute: 0.3 10*3/uL (ref 0.1–1.0)
Monocytes Relative: 7 %
Neutro Abs: 2.8 10*3/uL (ref 1.7–7.7)
Neutrophils Relative %: 60 %
Platelets: 151 10*3/uL (ref 150–400)
RBC: 4.81 MIL/uL (ref 4.22–5.81)
RDW: 15.6 % — ABNORMAL HIGH (ref 11.5–15.5)
WBC: 4.7 10*3/uL (ref 4.0–10.5)
nRBC: 0 % (ref 0.0–0.2)

## 2024-01-06 LAB — COMPREHENSIVE METABOLIC PANEL WITH GFR
ALT: 12 U/L (ref 0–44)
AST: 38 U/L (ref 15–41)
Albumin: 3.8 g/dL (ref 3.5–5.0)
Alkaline Phosphatase: 67 U/L (ref 38–126)
Anion gap: 7 (ref 5–15)
BUN: 18 mg/dL (ref 8–23)
CO2: 26 mmol/L (ref 22–32)
Calcium: 10 mg/dL (ref 8.9–10.3)
Chloride: 102 mmol/L (ref 98–111)
Creatinine, Ser: 1.4 mg/dL — ABNORMAL HIGH (ref 0.61–1.24)
GFR, Estimated: 51 mL/min — ABNORMAL LOW (ref >60)
Glucose, Bld: 96 mg/dL (ref 70–99)
Potassium: 4.2 mmol/L (ref 3.5–5.1)
Sodium: 135 mmol/L (ref 135–145)
Total Bilirubin: 0.5 mg/dL (ref 0.0–1.2)
Total Protein: 6.8 g/dL (ref 6.5–8.1)

## 2024-01-06 NOTE — Telephone Encounter (Signed)
 Attempted to call patient regarding lab work completed today.  His hyperkalemia has improved.  He continues to have a increase from baseline his creatinine but this is downtrending.  Plan to repeat labs in 1 week.

## 2024-01-07 NOTE — Progress Notes (Signed)
 REFERRING PHYSICIAN:  [facility] [address] [place],  [place] [address]  DOS:  10/31/23 thoracic laminectomy for spinal cord stimulator placement 12/11/2023 removal of spinal cord stimulator secondary to infection  HISTORY OF PRESENT ILLNESS: Danny Marquez is approximately 4 weeks status post removal of his spinal cord stimulator due to infection.  Overall he is doing well.  His antibiotic course of doxycycline  followed by Bactrim  is now complete.  He feels as though the swelling and redness has improved tremendously.  Currently he is not in any pain.   PHYSICAL EXAMINATION:  General: Patient is well developed, well nourished, calm, collected, and in no apparent distress.   NEUROLOGICAL:  General: In no acute distress.   Awake, alert, oriented to person.  With some confusion noted during examination which is his baseline.    Strength:            Side Iliopsoas Quads Hamstring PF DF EHL  R 5 5 5 5 5 5   L 5 5 5 5 5 5    Incisions with little to no surrounding erythema.  Small area around his proximal incision that has small pinpoint opening that looks as though it is currently healing.  Recent Results (from the past 2160 hours)  Basic metabolic panel per protocol     Status: Abnormal   Collection Time: 10/18/23  2:51 PM  Result Value Ref Range   Sodium 137 135 - 145 mmol/L   Potassium 4.0 3.5 - 5.1 mmol/L   Chloride 105 98 - 111 mmol/L   CO2 27 22 - 32 mmol/L   Glucose, Bld 99 70 - 99 mg/dL    Comment: Glucose reference range applies only to samples taken after fasting for at least 8 hours.   BUN 18 8 - 23 mg/dL   Creatinine, Ser 6.57 (H) 0.61 - 1.24 mg/dL   Calcium  9.6 8.9 - 10.3 mg/dL   GFR, Estimated 58 (L) >60 mL/min    Comment: (NOTE) Calculated using the CKD-EPI Creatinine Equation (2021)    Anion gap 5 5 - 15    Comment: Performed at [facility], [address] [place], [place] [address]  CBC per protocol     Status: Abnormal    Collection Time: 10/18/23  2:51 PM  Result Value Ref Range   WBC 4.7 4.0 - 10.5 K/uL   RBC 4.64 4.22 - 5.81 MIL/uL   Hemoglobin 12.8 (L) 13.0 - 17.0 g/dL   HCT 29.5 28.4 - 13.2 %   MCV 87.1 80.0 - 100.0 fL   MCH 27.6 26.0 - 34.0 pg   MCHC 31.7 30.0 - 36.0 g/dL   RDW 44.0 (H) 10.2 - 72.5 %   Platelets 158 150 - 400 K/uL   nRBC 0.0 0.0 - 0.2 %    Comment: Performed at [facility], [address] [place], [place] [address]  Surgical pcr screen     Status: Abnormal   Collection Time: 10/18/23  2:52 PM   Specimen: Nasal Mucosa; Nasal Swab  Result Value Ref Range   MRSA, PCR POSITIVE (A) NEGATIVE    Comment: CRITICAL RESULT CALLED TO, READ BACK BY AND VERIFIED WITH: Francee Inch NP @ 1734 10/18/23 [facility]    Staphylococcus aureus POSITIVE (A) NEGATIVE    Comment: (NOTE) The Xpert SA Assay (FDA approved for NASAL specimens in patients 68 years of age and older), is one component of a comprehensive surveillance program. It is not intended to diagnose infection nor to guide  or monitor treatment. Performed at [facility], [address] [place], [place] [address]   Basic metabolic panel     Status: Abnormal   Collection Time: 12/10/23  4:22 PM  Result Value Ref Range   Sodium 138 135 - 145 mmol/L   Potassium 4.4 3.5 - 5.1 mmol/L   Chloride 106 98 - 111 mmol/L   CO2 26 22 - 32 mmol/L   Glucose, Bld 101 (H) 70 - 99 mg/dL    Comment: Glucose reference range applies only to samples taken after fasting for at least 8 hours.   BUN 14 8 - 23 mg/dL   Creatinine, Ser 6.04 0.61 - 1.24 mg/dL   Calcium  9.8 8.9 - 10.3 mg/dL   GFR, Estimated >54 >09 mL/min    Comment: (NOTE) Calculated using the CKD-EPI Creatinine Equation (2021)    Anion gap 6 5 - 15    Comment: Performed at [facility], [address] [place], [place] [address]  CBC with Differential     Status: Abnormal   Collection Time: 12/10/23  4:22 PM  Result Value Ref Range   WBC 5.3 4.0 - 10.5 K/uL    RBC 4.69 4.22 - 5.81 MIL/uL   Hemoglobin 13.0 13.0 - 17.0 g/dL   HCT 47.8 29.5 - 62.1 %   MCV 89.3 80.0 - 100.0 fL   MCH 27.7 26.0 - 34.0 pg   MCHC 31.0 30.0 - 36.0 g/dL   RDW 30.8 65.7 - 84.6 %   Platelets 261 150 - 400 K/uL   nRBC 0.0 0.0 - 0.2 %   Neutrophils Relative % 66 %   Neutro Abs 3.5 1.7 - 7.7 K/uL   Lymphocytes Relative 16 %   Lymphs Abs 0.9 0.7 - 4.0 K/uL   Monocytes Relative 7 %   Monocytes Absolute 0.4 0.1 - 1.0 K/uL   Eosinophils Relative 10 %   Eosinophils Absolute 0.6 (H) 0.0 - 0.5 K/uL   Basophils Relative 1 %   Basophils Absolute 0.0 0.0 - 0.1 K/uL   Immature Granulocytes 0 %   Abs Immature Granulocytes 0.02 0.00 - 0.07 K/uL    Comment: Performed at [facility], [address] [place], [place] [address]  Lactic acid, plasma     Status: None   Collection Time: 12/10/23  4:22 PM  Result Value Ref Range   Lactic Acid, Venous 0.6 0.5 - 1.9 mmol/L    Comment: Performed at [facility], [address] [place], [place] [address]  Basic metabolic panel with GFR     Status: Abnormal   Collection Time: 12/11/23  3:43 AM  Result Value Ref Range   Sodium 132 (L) 135 - 145 mmol/L   Potassium 4.0 3.5 - 5.1 mmol/L   Chloride 102 98 - 111 mmol/L   CO2 22 22 - 32 mmol/L   Glucose, Bld 91 70 - 99 mg/dL    Comment: Glucose reference range applies only to samples taken after fasting for at least 8 hours.   BUN 12 8 - 23 mg/dL   Creatinine, Ser 2.44 0.61 - 1.24 mg/dL   Calcium  8.8 (L) 8.9 - 10.3 mg/dL   GFR, Estimated >01 >02 mL/min    Comment: (NOTE) Calculated using the CKD-EPI Creatinine Equation (2021)    Anion gap 8 5 - 15    Comment: Performed at [facility], [address] [place], [place] [address]  Aerobic/Anaerobic Culture w Gram Stain (surgical/deep wound)     Status:  None   Collection Time: 12/11/23 11:35 AM   Specimen: Path Tissue  Result Value Ref Range   Specimen Description WOUND    Special Requests BACK    Gram  Stain NO WBC SEEN NO ORGANISMS SEEN     Culture      RARE METHICILLIN RESISTANT STAPHYLOCOCCUS AUREUS NO ANAEROBES ISOLATED Performed at [facility], [address], [place], [place] [address]    Report Status 12/16/2023 FINAL    Organism ID, Bacteria METHICILLIN RESISTANT STAPHYLOCOCCUS AUREUS       Susceptibility   Methicillin resistant staphylococcus aureus - MIC*    CIPROFLOXACIN >=8 RESISTANT Resistant     ERYTHROMYCIN >=8 RESISTANT Resistant     GENTAMICIN <=0.5 SENSITIVE Sensitive     OXACILLIN >=4 RESISTANT Resistant     TETRACYCLINE <=1 SENSITIVE Sensitive     VANCOMYCIN  1 SENSITIVE Sensitive     TRIMETH /SULFA  <=10 SENSITIVE Sensitive     CLINDAMYCIN <=0.25 SENSITIVE Sensitive     RIFAMPIN <=0.5 SENSITIVE Sensitive     Inducible Clindamycin NEGATIVE Sensitive     LINEZOLID 2 SENSITIVE Sensitive     * RARE METHICILLIN RESISTANT STAPHYLOCOCCUS AUREUS  Creatinine, serum     Status: None   Collection Time: 12/12/23  3:41 AM  Result Value Ref Range   Creatinine, Ser 1.14 0.61 - 1.24 mg/dL   GFR, Estimated >19 >14 mL/min    Comment: (NOTE) Calculated using the CKD-EPI Creatinine Equation (2021) Performed at [facility], [address] [place], [place] [address]   Creatinine, serum     Status: None   Collection Time: 12/13/23  5:26 AM  Result Value Ref Range   Creatinine, Ser 1.15 0.61 - 1.24 mg/dL   GFR, Estimated >62 >13 mL/min    Comment: (NOTE) Calculated using the CKD-EPI Creatinine Equation (2021) Performed at [facility], [address] [place], [place] [address]   Creatinine, serum     Status: None   Collection Time: 12/14/23  5:16 AM  Result Value Ref Range   Creatinine, Ser 1.17 0.61 - 1.24 mg/dL   GFR, Estimated >84 >69 mL/min    Comment: (NOTE) Calculated using the CKD-EPI Creatinine Equation (2021) Performed at [facility], [address] [place], [place] [address]   Basic Metabolic Panel (BMET)      Status: Abnormal   Collection Time: 12/31/23  4:35 PM  Result Value Ref Range   Sodium 136 135 - 145 mmol/L   Potassium 5.4 (H) 3.5 - 5.1 mmol/L   Chloride 103 98 - 111 mmol/L   CO2 26 22 - 32 mmol/L   Glucose, Bld 107 (H) 70 - 99 mg/dL    Comment: Glucose reference range applies only to samples taken after fasting for at least 8 hours.   BUN 19 8 - 23 mg/dL   Creatinine, Ser 8.41 (H) 0.61 - 1.24 mg/dL   Calcium  10.1 8.9 - 10.3 mg/dL   GFR, Estimated 46 (L) >60 mL/min    Comment: (NOTE) Calculated using the CKD-EPI Creatinine Equation (2021)    Anion gap 7 5 - 15    Comment: Performed at [facility], [address] [place], [place] [address]  CBC with Differential     Status: Abnormal   Collection Time: 12/31/23  4:35 PM  Result Value Ref Range   WBC 5.0 4.0 - 10.5 K/uL   RBC 4.62 4.22 - 5.81 MIL/uL   Hemoglobin 12.6 (L) 13.0 - 17.0 g/dL   HCT 39.6  39.0 - 52.0 %   MCV 85.7 80.0 - 100.0 fL   MCH 27.3 26.0 - 34.0 pg   MCHC 31.8 30.0 - 36.0 g/dL   RDW 16.1 (H) 09.6 - 04.5 %   Platelets 160 150 - 400 K/uL   nRBC 0.0 0.0 - 0.2 %   Neutrophils Relative % 63 %   Neutro Abs 3.2 1.7 - 7.7 K/uL   Lymphocytes Relative 21 %   Lymphs Abs 1.1 0.7 - 4.0 K/uL   Monocytes Relative 7 %   Monocytes Absolute 0.4 0.1 - 1.0 K/uL   Eosinophils Relative 8 %   Eosinophils Absolute 0.4 0.0 - 0.5 K/uL   Basophils Relative 1 %   Basophils Absolute 0.0 0.0 - 0.1 K/uL   Immature Granulocytes 0 %   Abs Immature Granulocytes 0.01 0.00 - 0.07 K/uL    Comment: Performed at [facility], [address] [place], [place] [address]  Comprehensive Metabolic Panel (CMET)     Status: Abnormal   Collection Time: 01/06/24  2:05 PM  Result Value Ref Range   Sodium 135 135 - 145 mmol/L   Potassium 4.2 3.5 - 5.1 mmol/L   Chloride 102 98 - 111 mmol/L   CO2 26 22 - 32 mmol/L   Glucose, Bld 96 70 - 99 mg/dL    Comment: Glucose reference range applies only to samples taken after fasting for  at least 8 hours.   BUN 18 8 - 23 mg/dL   Creatinine, Ser 1.91 (H) 0.61 - 1.24 mg/dL   Calcium  10.0 8.9 - 10.3 mg/dL   Total Protein 6.8 6.5 - 8.1 g/dL   Albumin 3.8 3.5 - 5.0 g/dL   AST 38 15 - 41 U/L   ALT 12 0 - 44 U/L   Alkaline Phosphatase 67 38 - 126 U/L   Total Bilirubin 0.5 0.0 - 1.2 mg/dL   GFR, Estimated 51 (L) >60 mL/min    Comment: (NOTE) Calculated using the CKD-EPI Creatinine Equation (2021)    Anion gap 7 5 - 15    Comment: Performed at [facility], [address] [place], [place] [address]  CBC with Differential     Status: Abnormal   Collection Time: 01/06/24  2:05 PM  Result Value Ref Range   WBC 4.7 4.0 - 10.5 K/uL   RBC 4.81 4.22 - 5.81 MIL/uL   Hemoglobin 13.1 13.0 - 17.0 g/dL   HCT 56.2 13.0 - 86.5 %   MCV 86.5 80.0 - 100.0 fL   MCH 27.2 26.0 - 34.0 pg   MCHC 31.5 30.0 - 36.0 g/dL   RDW 78.4 (H) 69.6 - 29.5 %   Platelets 151 150 - 400 K/uL   nRBC 0.0 0.0 - 0.2 %   Neutrophils Relative % 60 %   Neutro Abs 2.8 1.7 - 7.7 K/uL   Lymphocytes Relative 22 %   Lymphs Abs 1.0 0.7 - 4.0 K/uL   Monocytes Relative 7 %   Monocytes Absolute 0.3 0.1 - 1.0 K/uL   Eosinophils Relative 10 %   Eosinophils Absolute 0.5 0.0 - 0.5 K/uL   Basophils Relative 1 %   Basophils Absolute 0.0 0.0 - 0.1 K/uL   Immature Granulocytes 0 %   Abs Immature Granulocytes 0.01 0.00 - 0.07 K/uL    Comment: Performed at [facility], [address] [place], [place] [address]       ROS (Neurologic):  Negative except as noted above  IMAGING: No new imaging.  ASSESSMENT/PLAN:  Danny Marquez is approximately 4 weeks status post removal of his spinal cord stimulator due to infection.  Overall he is doing well.  His antibiotic course of doxycycline  followed by Bactrim  is now complete.  He feels as though the swelling and redness has improved tremendously.  Currently he is not in any pain. His examination is to baseline. Incisions with little to no surrounding  erythema.  Small area around his proximal incision that has small pinpoint opening that looks as though it is currently healing.  At this point, no need for additional antibiotic therapy.  His hyperkalemia has corrected.  He does continue to have a small bump in his creatinine from his baseline however this is downtrending.  Plan to repeat labs next week to ensure they are back within normal limits especially considering he has a solitary kidney.  Will review lab results once complete.  Patient and his wife encouraged to reach out to me for any questions or concerns to have.   Advised to contact the office if any questions or concerns arise.  Ludwig Safer PA-C Department of neurosurgery

## 2024-01-16 ENCOUNTER — Other Ambulatory Visit
Admission: RE | Admit: 2024-01-16 | Discharge: 2024-01-16 | Disposition: A | Discharge status: Home / Self Care | Attending: Physician Assistant | Admitting: Physician Assistant

## 2024-01-16 DIAGNOSIS — R7989 Other specified abnormal findings of blood chemistry: Secondary | ICD-10-CM | POA: Insufficient documentation

## 2024-01-16 DIAGNOSIS — Z905 Acquired absence of kidney: Secondary | ICD-10-CM | POA: Diagnosis present

## 2024-01-16 LAB — COMPREHENSIVE METABOLIC PANEL WITH GFR
ALT: 12 U/L (ref 0–44)
AST: 38 U/L (ref 15–41)
Albumin: 3.5 g/dL (ref 3.5–5.0)
Alkaline Phosphatase: 65 U/L (ref 38–126)
Anion gap: 7 (ref 5–15)
BUN: 17 mg/dL (ref 8–23)
CO2: 28 mmol/L (ref 22–32)
Calcium: 10.1 mg/dL (ref 8.9–10.3)
Chloride: 102 mmol/L (ref 98–111)
Creatinine, Ser: 1.41 mg/dL — ABNORMAL HIGH (ref 0.61–1.24)
GFR, Estimated: 51 mL/min — ABNORMAL LOW (ref >60)
Glucose, Bld: 125 mg/dL — ABNORMAL HIGH (ref 70–99)
Potassium: 4.5 mmol/L (ref 3.5–5.1)
Sodium: 137 mmol/L (ref 135–145)
Total Bilirubin: 0.6 mg/dL (ref 0.0–1.2)
Total Protein: 6.6 g/dL (ref 6.5–8.1)

## 2024-01-20 ENCOUNTER — Other Ambulatory Visit: Payer: Self-pay | Admitting: Physician Assistant

## 2024-01-20 DIAGNOSIS — R7989 Other specified abnormal findings of blood chemistry: Secondary | ICD-10-CM

## 2024-01-20 DIAGNOSIS — Z905 Acquired absence of kidney: Secondary | ICD-10-CM

## 2024-01-20 NOTE — Progress Notes (Signed)
 Nephrology referral

## 2024-01-22 ENCOUNTER — Encounter: Payer: Self-pay | Admitting: Neurosurgery

## 2024-01-22 ENCOUNTER — Ambulatory Visit (INDEPENDENT_AMBULATORY_CARE_PROVIDER_SITE_OTHER): Admitting: Neurosurgery

## 2024-01-22 VITALS — BP 120/68 | Temp 97.8°F | Ht 69.0 in | Wt 182.0 lb

## 2024-01-22 DIAGNOSIS — Z09 Encounter for follow-up examination after completed treatment for conditions other than malignant neoplasm: Secondary | ICD-10-CM

## 2024-01-22 DIAGNOSIS — T8149XA Infection following a procedure, other surgical site, initial encounter: Secondary | ICD-10-CM

## 2024-01-22 NOTE — Progress Notes (Signed)
   REFERRING PHYSICIAN:  [facility] [address] [place],  [place] [address]  DOS:  10/31/23 thoracic laminectomy for spinal cord stimulator placement 12/11/2023 removal of spinal cord stimulator secondary to infection  HISTORY OF PRESENT ILLNESS: Danny Marquez is here today for follow-up.  He had initial spinal cord stimulator placement 10/31/2023 and unfortunately had a removal on 12/11/2023 by my partner Dr. Debrah Fan.  He is understandably displeased with the overall result of his procedure as we had to remove the spinal cord stimulator.  Understandably frustrated with the situation, continues to follow with his medical team for his medical issues, currently planning on being seen by nephrology for his creatinine bump.   PHYSICAL EXAMINATION:  General: Patient is well developed, well nourished, calm, collected, and in no apparent distress.   NEUROLOGICAL:  General: In no acute distress.   Awake, alert, oriented to person.  With some confusion noted during examination which is his baseline.     ROS (Neurologic):  Negative except as noted above  IMAGING: No new imaging.  ASSESSMENT/PLAN:  Danny Marquez is here today for follow-up.  He had a placement of a spinal cord stimulator on 2/22 /2025 unfortunately had a wound dehiscence and surgical site infection and had to have the stimulator removed by my partner Dr. Debrah Fan.  Understandably patient is quite displeased with the outcome of the procedure, I did discuss with him that I was also sorry that he had got an infection as it led to the removal of his implant.  We did discuss that with his spine history I could refer him for a second opinion about the low back and whether or not he would be a surgical candidate for another type of procedure but also let him know that I did have guarded expectations for a another spinal procedure.  He agrees and does not want to have any evaluation for any further surgical intervention.  He  is working with his pain team now.  We discussed ways in which we could of done better, and I let him know I would reach out to the stimulator team with his issues.  I also let him know that I be available to talk with his wife who is often at his appointments but was not here today.  Advised to contact the office if any questions or concerns arise.  Carroll Clamp, MD Department of neurosurgery

## 2024-02-12 ENCOUNTER — Encounter: Payer: Self-pay | Admitting: Nurse Practitioner

## 2024-02-12 ENCOUNTER — Ambulatory Visit: Attending: Nurse Practitioner | Admitting: Nurse Practitioner

## 2024-02-12 VITALS — BP 106/60 | HR 64 | Temp 98.0°F | Resp 16 | Ht 69.0 in | Wt 180.0 lb

## 2024-02-12 DIAGNOSIS — M961 Postlaminectomy syndrome, not elsewhere classified: Secondary | ICD-10-CM | POA: Insufficient documentation

## 2024-02-12 DIAGNOSIS — Z981 Arthrodesis status: Secondary | ICD-10-CM | POA: Insufficient documentation

## 2024-02-12 DIAGNOSIS — M4726 Other spondylosis with radiculopathy, lumbar region: Secondary | ICD-10-CM | POA: Insufficient documentation

## 2024-02-12 DIAGNOSIS — M5416 Radiculopathy, lumbar region: Secondary | ICD-10-CM

## 2024-02-12 DIAGNOSIS — G894 Chronic pain syndrome: Secondary | ICD-10-CM | POA: Insufficient documentation

## 2024-02-12 DIAGNOSIS — M47816 Spondylosis without myelopathy or radiculopathy, lumbar region: Secondary | ICD-10-CM

## 2024-02-12 DIAGNOSIS — G8929 Other chronic pain: Secondary | ICD-10-CM

## 2024-02-12 DIAGNOSIS — Z87891 Personal history of nicotine dependence: Secondary | ICD-10-CM | POA: Diagnosis not present

## 2024-02-12 DIAGNOSIS — M461 Sacroiliitis, not elsewhere classified: Secondary | ICD-10-CM | POA: Diagnosis not present

## 2024-02-12 MED ORDER — OXYCODONE-ACETAMINOPHEN 10-325 MG PO TABS: 1.0000 | ORAL_TABLET | Freq: Two times a day (BID) | ORAL | 0 refills | Status: AC | PRN

## 2024-02-12 NOTE — Progress Notes (Signed)
 PROVIDER NOTE: Interpretation of information contained herein should be left to medically-trained personnel. Specific patient instructions are provided elsewhere under "Patient Instructions" section of medical record. This document was created in part using AI and STT-dictation technology, any transcriptional errors that may result from this process are unintentional.  Patient: Danny Marquez  Service: E/M   PCP: [facility]  DOB: May 20, 1946  DOS: 02/12/2024  Provider: Cherylin Corrigan, NP  MRN: [id number]  Delivery: Face-to-face  Specialty: Interventional Pain Management  Type: Established Patient  Setting: Ambulatory outpatient facility  Specialty designation: 09  Referring Prov.: [facility]  Location: Outpatient office facility       History of present illness (HPI) [name], a 78 y.o. year old male, is here today because of his Failed back surgical syndrome [M96.1]. [name] primary complain today is Back Pain (Lower, right)  Pertinent problems: [name] has a Chronic pain syndrome; History of Lumbar fusion; Chronic radicular lumbar pain; S/P fusion of sacroiliac joint (Right); Chronic right SI joint pain; History of myocardial infraction; and Lumbar Spondylosis on their pertinent problem list.   Pain Assessment: Severity of Chronic pain is reported as a 9 /10. Location: Back Lower, Right/denies. Onset:  . Quality: Constant. Timing:  . Modifying factor(s): meds. Vitals:  height is 5\' 9"  (1.753 m) and weight is 180 lb (81.6 kg). His temperature is 98 F (36.7 C). His blood pressure is 106/60 and his pulse is 64. His respiration is 16 and oxygen saturation is 96%.  BMI: Estimated body mass index is 26.58 kg/m as calculated from the following:   Height as of this encounter: 5\' 9"  (1.753 m).   Weight as of this encounter: 180 lb (81.6 kg).  Last encounter: 06/20/2023 Last procedure: 09/18/2023  Reason for encounter: medication management. No change in  medical history since last visit.  Patient's pain is at baseline.  Patient continues multimodal pain regimen as prescribed.  States that it provides pain relief and improvement in functional status.   The patient underwent successful removal of spinal cord stimulator on December 11, 2023 by Dr. Debrah Marquez.  The explanation was performed due to infection at the site.  The patient was prescribed Percocet by his neurosurgery provider for management of persistent pain secondary to infection related to the spinal cord stimulator (SCS) removal.   Pharmacotherapy Assessment  Analgesic: Oxycodone -acetaminophen  (Percocet) 10-325 mg tablet every 12 hours as needed for pain. MME=30  Monitoring: [place] PMP: PDMP reviewed during this encounter.       Pharmacotherapy: No side-effects or adverse reactions reported. Compliance: No problems identified. Effectiveness: Clinically acceptable.  Danny Rabon, RN  02/12/2024  1:20 PM  Sign when Signing Visit Nursing Pain Medication Assessment:  Safety precautions to be maintained throughout the outpatient stay will include: orient to surroundings, keep bed in low position, maintain call bell within reach at all times, provide assistance with transfer out of bed and ambulation.  Medication Inspection Compliance: Pill count conducted under aseptic conditions, in front of the patient. Neither the pills nor the bottle was removed from the patient's sight at any time. Once count was completed pills were immediately returned to the patient in their original bottle.  Medication: Oxycodone /APAP Pill/Patch Count: 14 of 60 pills/patches remain Pill/Patch Appearance: Markings consistent with prescribed medication Bottle Appearance: Standard pharmacy container. Clearly labeled. Filled Date: 53 / 27 / 2025 Last Medication intake:  Today    No results found for: "CBDTHCR" No results found for: "D8THCCBX" No results  found for: "D9THCCBX"  UDS:  Summary  Date Value Ref Range Status   01/22/2023 Note  Final    Comment:    ==================================================================== Compliance Drug Analysis, Ur ==================================================================== Test                             Result       Flag       Units  Drug Present and Declared for Prescription Verification   Pregabalin                     PRESENT      EXPECTED   Sertraline                      PRESENT      EXPECTED   Desmethylsertraline            PRESENT      EXPECTED    Desmethylsertraline is an expected metabolite of sertraline .    Trazodone                       PRESENT      EXPECTED   1,3 chlorophenyl piperazine    PRESENT      EXPECTED    1,3-chlorophenyl piperazine is an expected metabolite of trazodone .    Acetaminophen                   PRESENT      EXPECTED   Metoprolol                      PRESENT      EXPECTED  Drug Present not Declared for Prescription Verification   Guaifenesin                    PRESENT      UNEXPECTED    Guaifenesin may be administered as an over-the-counter or    prescription drug; it may also be present as a breakdown product of    methocarbamol.  Drug Absent but Declared for Prescription Verification   Oxycodone                       Not Detected UNEXPECTED ng/mg creat   Salicylate                     Not Detected UNEXPECTED    Aspirin , as indicated in the declared medication list, is not always    detected even when used as directed.  ==================================================================== Test                      Result    Flag   Units      Ref Range   Creatinine              58               mg/dL      >=82 ==================================================================== Declared Medications:  The flagging and interpretation on this report are based on the  following declared medications.  Unexpected results may arise from  inaccuracies in the declared medications.   **Note: The testing scope of this panel  includes these medications:   Metoprolol  (Lopressor )  Oxycodone  (Oxy-IR)  Oxycodone  (Percocet)  Pregabalin (Lyrica)  Sertraline  (Zoloft )  Trazodone    **Note: The testing scope of this panel does not include small to  moderate amounts  of these reported medications:   Acetaminophen  (Percocet)  Aspirin    **Note: The testing scope of this panel does not include the  following reported medications:   Allopurinol  (Zyloprim )  Amiodarone (Pacerone)  Donepezil   Empagliflozin (Jardiance)  Losartan  (Cozaar )  Ranolazine  (Ranexa )  Rosuvastatin  (Crestor )  Torsemide (Demadex) ==================================================================== For clinical consultation, please call [phone]. ====================================================================      ROS  Constitutional: Denies any fever or chills Gastrointestinal: No reported hemesis, hematochezia, vomiting, or acute GI distress Musculoskeletal: Lower back pain on right side Neurological: No reported episodes of acute onset apraxia, aphasia, dysarthria, agnosia, amnesia, paralysis, loss of coordination, or loss of consciousness  Medication Review  allopurinol , aspirin  EC, donepezil , empagliflozin, losartan , metoprolol  succinate, oxyCODONE -acetaminophen , pregabalin, ranolazine , rosuvastatin , sertraline , torsemide, and traZODone   History Review  Allergy: Mr. Mcfadyen is allergic to codeine. Drug: Mr. Crum  reports that he does not currently use drugs. Alcohol:  reports that he does not currently use alcohol. Tobacco:  reports that he quit smoking about 20 years ago. His smoking use included cigarettes. He has never used smokeless tobacco. Social: Mr. Tiemann  reports that he quit smoking about 20 years ago. His smoking use included cigarettes. He has never used smokeless tobacco. He reports that he does not currently use alcohol. He reports that he does not currently use drugs. 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), Coronary artery disease, Depression, Difficulty walking, Diverticulosis, Failed back surgical syndrome, GERD (gastroesophageal reflux disease), Gout, Hepatic fibrosis, History of alcohol dependence (HCC), History of left atrial appendage closure (03/19/2022), HTN (hypertension), Hyperlipidemia, Insomnia, Iron deficiency, Ischemic cardiomyopathy, LBBB (left bundle branch block), Mild dementia (HCC), 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 Surgical: Mr. Divis  has a past surgical history that includes Coronary artery bypass graft (N/A, 09/13/2006); Sacroiliac joint fusion (Right); CARDIOVERSION (N/A, 01/23/2017); CARDIOVERSION (N/A, 02/01/2017); Shoulder arthroscopy (Right); Colonoscopy with propofol  (N/A, 03/05/2019); Esophagogastroduodenoscopy (egd) with propofol  (N/A, 03/05/2019); LEFT HEART CATH AND CORONARY ANGIOGRAPHY (Left, 09/2006); WATCHMAN IMPLANT (N/A, 03/19/2022); Total knee arthroplasty (Bilateral); Lumbar spine surgery; Cholecystectomy; V TACH ABLATION (N/A, 05/24/2022); BIV [employer] ICD INSERTION CRT-D (N/A, 07/23/2021); V TACH ABLATION (successful staged procedure) (N/A, 05/31/2022); LEFT HEART CATH AND CORS/GRAFTS  ANGIOGRAPHY (Left, 01/23/2022); and Thoracic laminectomy for spinal cord stimulator (N/A, 10/31/2023). Family: family history includes Heart attack in his father.  Laboratory Chemistry Profile   Renal Lab Results  Component Value Date   BUN 17 01/16/2024   CREATININE 1.41 (H) 01/16/2024   GFRAA >60 08/06/2019   GFRNONAA 51 (L) 01/16/2024    Hepatic Lab Results  Component Value Date   AST 38 01/16/2024   ALT 12 01/16/2024   ALBUMIN 3.5 01/16/2024   ALKPHOS 65 01/16/2024   LIPASE 356 (H) 08/06/2019   AMMONIA 29 08/06/2019    Electrolytes Lab Results  Component Value Date   NA 137 01/16/2024   K 4.5 01/16/2024   CL 102 01/16/2024   CALCIUM  10.1 01/16/2024   MG 2.1 05/20/2021  Bone No results found for: "VD25OH", "VD125OH2TOT", "VW0981XB1", "YN8295AO1", "25OHVITD1", "25OHVITD2", "25OHVITD3", "TESTOFREE", "TESTOSTERONE"  Inflammation (CRP: Acute Phase) (ESR: Chronic Phase) Lab Results  Component Value Date   LATICACIDVEN 0.6 12/10/2023         Note: Above Lab results reviewed.  Recent Imaging Review  DG Lumbar Spine 2-3 Views CLINICAL DATA:  Spinal stimulator, low back pain  EXAM: LUMBAR SPINE - 2-3 VIEW  COMPARISON:  09/27/2022  FINDINGS: Post instrumented fusion L4-S1. Stable hardware across the right sacroiliac joint. Implanted generator is noted, cephalad extent of lead not visualized on this study. No fracture or dislocation. Mild narrowing of interspaces L2-L4. Aortic Atherosclerosis (ICD10-170.0).  IMPRESSION: 1. No acute findings. 2. Post   fusion L4-S1.  Electronically Signed   By: Nicoletta Barrier M.D.   On: 12/10/2023 18:11 DG Thoracic Spine 2 View CLINICAL DATA:  [id number] Pre-op exam [id number]  EXAM: THORACIC SPINE 2 VIEWS  COMPARISON:  10/31/2023  FINDINGS: Mild thoracic kyphosis. No fracture or dislocation. Dorsal stimulator catheters extend up to the T8 level. Sternotomy wires. AICD leads partially visualized.  IMPRESSION: 1. No acute  findings. 2. Dorsal stimulator catheters.  Electronically Signed   By: Nicoletta Barrier M.D.   On: 12/10/2023 18:09 Note: Reviewed         Physical Exam  General appearance: Well nourished, well developed, and well hydrated. In no apparent acute distress Mental status: Alert, oriented x 3 (person, place, & time)       Respiratory: No evidence of acute respiratory distress Eyes: PERLA Vitals: BP 106/60   Pulse 64   Temp 98 F (36.7 C)   Resp 16   Ht 5\' 9"  (1.753 m)   Wt 180 lb (81.6 kg)   SpO2 96%   BMI 26.58 kg/m  BMI: Estimated body mass index is 26.58 kg/m as calculated from the following:   Height as of this encounter: 5\' 9"  (1.753 m).   Weight as of this encounter: 180 lb (81.6 kg). Ideal: Ideal body weight: 70.7 kg (155 lb 13.8 oz) Adjusted ideal body weight: 75.1 kg (165 lb 8.3 oz)  Assessment   Diagnosis Status  1. Failed back surgical syndrome   2. Chronic pain syndrome   3. Lumbar radiculopathy   4. Chronic radicular lumbar pain   5. History of lumbar fusion (L4-S1)   6. Lumbar facet arthropathy   7. Lumbar spondylosis   8. SI joint arthritis (HCC)    Controlled Controlled Controlled   Updated Problems: No problems updated.  Plan of Care  Problem-specific:  Assessment and Plan We will continue on current medication regimen.  Prescribing drug monitoring (PDMP) reviewed; findings consistent with the use of prescribed medication and no evidence of narcotic misuse or abuse. Routine UDS ordered today.  Schedule follow-up in 90 days.  No other new issues or problems reported at this visit.   Mr. Dequon Schnebly has a current medication list which includes the following long-term medication(s): allopurinol , donepezil , metoprolol  succinate, pregabalin, ranolazine , rosuvastatin , sertraline , and torsemide.  Pharmacotherapy (Medications Ordered): Meds ordered this encounter  Medications   oxyCODONE -acetaminophen  (PERCOCET) 10-325 MG tablet    Sig: Take 1  tablet by mouth every 12 (twelve) hours as needed for pain.    Dispense:  60 tablet    Refill:  0   oxyCODONE -acetaminophen  (PERCOCET) 10-325 MG tablet    Sig: Take 1 tablet by mouth every 12 (twelve) hours as needed for pain.    Dispense:  60 tablet    Refill:  0   oxyCODONE -acetaminophen  (PERCOCET) 10-325 MG tablet    Sig: Take 1 tablet by mouth every 12 (twelve) hours as needed for pain.    Dispense:  60 tablet    Refill:  0   Orders:  Orders Placed This Encounter  Procedures   ToxASSURE Select 13 (MW), Urine    Volume: 30 ml(s). Minimum 3 ml of urine is needed. Document temperature of fresh sample. Indications: Long term (current) use of opiate analgesic (X09.604)    Release to patient:   Immediate        Return in about 3 months (around 05/14/2024) for (F2F), (MM), Marthe Slain NP.    Recent Visits No visits were found meeting these conditions. Showing recent visits within past 90 days and meeting all other requirements Today's Visits Date Type Provider Dept  02/12/24 Office Visit Mosella Kasa K, NP [facility]  Showing today's visits and meeting all other requirements Future Appointments No visits were found meeting these conditions. Showing future appointments within next 90 days and meeting all other requirements  I discussed the assessment and treatment plan with the patient. The patient was provided an opportunity to ask questions and all were answered. The patient agreed with the plan and demonstrated an understanding of the instructions.  Patient advised to call back or seek an in-person evaluation if the symptoms or condition worsens.  Duration of encounter: 30 minutes.  Total time on encounter, as per AMA guidelines included both the face-to-face and non-face-to-face time personally spent by the physician and/or other qualified health care professional(s) on the day of the encounter (includes time in activities that require the physician or other  qualified health care professional and does not include time in activities normally performed by clinical staff). Physician's time may include the following activities when performed: Preparing to see the patient (e.g., pre-charting review of records, searching for previously ordered imaging, lab work, and nerve conduction tests) Review of prior analgesic pharmacotherapies. Reviewing PMP Interpreting ordered tests (e.g., lab work, imaging, nerve conduction tests) Performing post-procedure evaluations, including interpretation of diagnostic procedures Obtaining and/or reviewing separately obtained history Performing a medically appropriate examination and/or evaluation Counseling and educating the patient/family/caregiver Ordering medications, tests, or procedures Referring and communicating with other health care professionals (when not separately reported) Documenting clinical information in the electronic or other health record Independently interpreting results (not separately reported) and communicating results to the patient/ family/caregiver Care coordination (not separately reported)  Note by: Shani Fitch K Alonni Heimsoth, NP (TTS and AI technology used. I apologize for any typographical errors that were not detected and corrected.) Date: 02/12/2024; Time: 2:06 PM

## 2024-02-12 NOTE — Progress Notes (Signed)
 Nursing Pain Medication Assessment:  Safety precautions to be maintained throughout the outpatient stay will include: orient to surroundings, keep bed in low position, maintain call bell within reach at all times, provide assistance with transfer out of bed and ambulation.  Medication Inspection Compliance: Pill count conducted under aseptic conditions, in front of the patient. Neither the pills nor the bottle was removed from the patient's sight at any time. Once count was completed pills were immediately returned to the patient in their original bottle.  Medication: Oxycodone /APAP Pill/Patch Count: 14 of 60 pills/patches remain Pill/Patch Appearance: Markings consistent with prescribed medication Bottle Appearance: Standard pharmacy container. Clearly labeled. Filled Date: 50 / 27 / 2025 Last Medication intake:  Today

## 2024-02-13 ENCOUNTER — Encounter: Admitting: Nurse Practitioner

## 2024-02-18 LAB — TOXASSURE SELECT 13 (MW), URINE

## 2024-03-04 ENCOUNTER — Encounter: Admitting: Physician Assistant

## 2024-05-14 ENCOUNTER — Encounter: Admitting: Nurse Practitioner

## 2024-07-15 ENCOUNTER — Ambulatory Visit: Attending: Nurse Practitioner | Admitting: Nurse Practitioner

## 2024-07-15 ENCOUNTER — Encounter: Payer: Self-pay | Admitting: Nurse Practitioner

## 2024-07-15 VITALS — BP 125/73 | HR 67 | Temp 97.3°F | Ht 69.0 in | Wt 180.0 lb

## 2024-07-15 DIAGNOSIS — M961 Postlaminectomy syndrome, not elsewhere classified: Secondary | ICD-10-CM | POA: Diagnosis not present

## 2024-07-15 DIAGNOSIS — Z87891 Personal history of nicotine dependence: Secondary | ICD-10-CM | POA: Insufficient documentation

## 2024-07-15 DIAGNOSIS — G894 Chronic pain syndrome: Secondary | ICD-10-CM | POA: Diagnosis not present

## 2024-07-15 DIAGNOSIS — M461 Sacroiliitis, not elsewhere classified: Secondary | ICD-10-CM | POA: Diagnosis not present

## 2024-07-15 DIAGNOSIS — Z981 Arthrodesis status: Secondary | ICD-10-CM | POA: Diagnosis not present

## 2024-07-15 DIAGNOSIS — G8929 Other chronic pain: Secondary | ICD-10-CM

## 2024-07-15 DIAGNOSIS — Z79899 Other long term (current) drug therapy: Secondary | ICD-10-CM | POA: Diagnosis present

## 2024-07-15 DIAGNOSIS — M5416 Radiculopathy, lumbar region: Secondary | ICD-10-CM | POA: Insufficient documentation

## 2024-07-15 MED ORDER — OXYCODONE-ACETAMINOPHEN 10-325 MG PO TABS: 1.0000 | ORAL_TABLET | Freq: Two times a day (BID) | ORAL | 0 refills | Status: AC | PRN

## 2024-07-15 NOTE — Progress Notes (Signed)
 Nursing Pain Medication Assessment:  Safety precautions to be maintained throughout the outpatient stay will include: orient to surroundings, keep bed in low position, maintain call bell within reach at all times, provide assistance with transfer out of bed and ambulation.  Medication Inspection Compliance: Pill count conducted under aseptic conditions, in front of the patient. Neither the pills nor the bottle was removed from the patient's sight at any time. Once count was completed pills were immediately returned to the patient in their original bottle.  Medication: Oxycodone /APAP Pill/Patch Count: 28 of 60 pills/patches remain Pill/Patch Appearance: Markings consistent with prescribed medication Bottle Appearance: Standard pharmacy container. Clearly labeled. Filled Date: 22 / 24 / 2025 Last Medication intake:  Today

## 2024-07-15 NOTE — Progress Notes (Signed)
 PROVIDER NOTE: Interpretation of information contained herein should be left to medically-trained personnel. Specific patient instructions are provided elsewhere under Patient Instructions section of medical record. This document was created in part using AI and STT-dictation technology, any transcriptional errors that may result from this process are unintentional.  Patient: Danny Marquez  Service: E/M   PCP: [facility]  DOB: 30-May-1946  DOS: 07/15/2024  Provider: Emmy MARLA Blanch, NP  MRN: [id number]  Delivery: Face-to-face  Specialty: Interventional Pain Management  Type: Established Patient  Setting: Ambulatory outpatient facility  Specialty designation: 09  Referring Prov.: [facility]  Location: Outpatient office facility       History of present illness (HPI) [name], a 78 y.o. year old male, is here today because of his Failed back surgical syndrome [M96.1]. Danny Marquez primary complain today is Back Pain (Lower right)  Pertinent problems: Danny Marquez has a Chronic pain syndrome; History of Lumbar fusion; Chronic radicular lumbar pain; S/P fusion of sacroiliac joint (Right); Chronic right SI joint pain; History of myocardial infraction; and Lumbar Spondylosis on their pertinent problem list.  Pain Assessment: Severity of   is reported as a 0-No pain/10. Location:    / . Onset:  . Quality:  . Timing:  . Modifying factor(s):  SABRA Vitals:  height is 5' 9 (1.753 m) and weight is 180 lb (81.6 kg). His temperature is 97.3 F (36.3 C) (abnormal). His blood pressure is 125/73 and his pulse is 67. His oxygen saturation is 99%.  BMI: Estimated body mass index is 26.58 kg/m as calculated from the following:   Height as of this encounter: 5' 9 (1.753 m).   Weight as of this encounter: 180 lb (81.6 kg).  Last encounter: 02/12/2024. Last procedure: Visit date not found.  Reason for encounter: medication management.   Discussed the use of AI scribe software for  clinical note transcription with the patient, who gave verbal consent to proceed.  History of Present Illness   Danny Marquez is a 78 year old male who presents with hip and lower back pain. He experiences pain primarily in his hips, with the right side being more affected. The pain is also present in his lower back. No current radiation of pain to the hip side or leg.  He manages his pain with oxycodone  taking it once daily as needed, despite the prescription being for every twelve hours. He finds that one dose usually suffices to control his pain. He has not reported any significant side effects from the medication, although he has experienced some issues with bowel movements, which he attributes to the medication. He maintains adequate hydration.     Pharmacotherapy Assessment   Oxycodone -acetaminophen  (Percocet) 10-325 mg tablet every 12 hours as needed for pain. MME=30  Monitoring:  PMP: PDMP reviewed during this encounter.       Pharmacotherapy: No side-effects or adverse reactions reported. Compliance: No problems identified. Effectiveness: Clinically acceptable.  Danny Dorothe LABOR, RN  07/15/2024  1:14 PM  Sign when Signing Visit Nursing Pain Medication Assessment:  Safety precautions to be maintained throughout the outpatient stay will include: orient to surroundings, keep bed in low position, maintain call bell within reach at all times, provide assistance with transfer out of bed and ambulation.  Medication Inspection Compliance: Pill count conducted under aseptic conditions, in front of the patient. Neither the pills nor the bottle was removed from the patient's sight at any time. Once count was completed pills were immediately  returned to the patient in their original bottle.  Medication: Oxycodone /APAP Pill/Patch Count: 28 of 60 pills/patches remain Pill/Patch Appearance: Markings consistent with prescribed medication Bottle Appearance: Standard pharmacy container. Clearly  labeled. Filled Date: 51 / 24 / 2025 Last Medication intake:  Today    UDS:  Summary  Date Value Ref Range Status  02/12/2024 FINAL  Final    Comment:    ==================================================================== [employer] [facility] 13 (MW) ==================================================================== Test                             Result       Flag       Units  Drug Present and Declared for Prescription Verification   Oxycodone                       1236         EXPECTED   ng/mg creat   Oxymorphone                    289          EXPECTED   ng/mg creat   Noroxycodone                   1852         EXPECTED   ng/mg creat   Noroxymorphone                 54           EXPECTED   ng/mg creat    Sources of oxycodone  are scheduled prescription medications.    Oxymorphone, noroxycodone, and noroxymorphone are expected    metabolites of oxycodone . Oxymorphone is also available as a    scheduled prescription medication.  ==================================================================== Test                      Result    Flag   Units      Ref Range   Creatinine              115              mg/dL      >=79 ==================================================================== Declared Medications:  The flagging and interpretation on this report are based on the  following declared medications.  Unexpected results may arise from  inaccuracies in the declared medications.   **Note: The testing scope of this panel includes these medications:   Oxycodone  (Percocet)   **Note: The testing scope of this panel does not include the  following reported medications:   Acetaminophen  (Percocet)  Allopurinol  (Zyloprim )  Aspirin   Donepezil  (Aricept )  Empagliflozin (Jardiance)  Losartan  (Cozaar )  Metoprolol  (Toprol )  Pregabalin (Lyrica)  Ranolazine  (Ranexa )  Rosuvastatin  (Crestor )  Sertraline  (Zoloft )  Torsemide (Demadex)  Trazodone   (Desyrel ) ==================================================================== For clinical consultation, please call [phone]. ====================================================================     No results found for: CBDTHCR No results found for: D8THCCBX No results found for: D9THCCBX  ROS  Constitutional: Denies any fever or chills Gastrointestinal: No reported hemesis, hematochezia, vomiting, or acute GI distress Musculoskeletal:  Back Pain (Lower right) Neurological: No reported episodes of acute onset apraxia, aphasia, dysarthria, agnosia, amnesia, paralysis, loss of coordination, or loss of consciousness  Medication Review  allopurinol , aspirin  EC, donepezil , empagliflozin, metoprolol  succinate, oxyCODONE -acetaminophen , pregabalin, ranolazine , rosuvastatin , sertraline , torsemide, and traZODone   History Review  Allergy: Mr. Vanroekel is allergic to codeine. Drug: Mr. Pavek  reports that he does  not currently use drugs. Alcohol:  reports that he does not currently use alcohol. Tobacco:  reports that he quit smoking about 20 years ago. His smoking use included cigarettes. He has never used smokeless tobacco. Social: Mr. Streeper  reports that he quit smoking about 20 years ago. His smoking use included cigarettes. He has never used smokeless tobacco. He reports that he does not currently use alcohol. He reports that he does not currently use drugs. 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), Coronary artery disease, Depression, Difficulty  walking, Diverticulosis, Failed back surgical syndrome, GERD (gastroesophageal reflux disease), Gout, Hepatic fibrosis, History of alcohol dependence (HCC), History of left atrial appendage closure (03/19/2022), HTN (hypertension), Hyperlipidemia, Insomnia, Iron deficiency, Ischemic cardiomyopathy, LBBB (left bundle branch block), Mild dementia (HCC), 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 Surgical: Mr. Iorio  has a past surgical history that includes Coronary artery bypass graft (N/A, 09/13/2006); Sacroiliac joint fusion (Right); CARDIOVERSION (N/A, 01/23/2017); CARDIOVERSION (N/A, 02/01/2017); Shoulder arthroscopy (Right); Colonoscopy with propofol  (N/A, 03/05/2019); Esophagogastroduodenoscopy (egd) with propofol  (N/A, 03/05/2019); LEFT HEART CATH AND CORONARY ANGIOGRAPHY (Left, 09/2006); WATCHMAN IMPLANT (N/A, 03/19/2022); Total knee arthroplasty (Bilateral); Lumbar spine surgery; Cholecystectomy; V TACH ABLATION (N/A, 05/24/2022); BIV [employer] ICD INSERTION CRT-D (N/A, 07/23/2021); V TACH ABLATION (successful staged procedure) (N/A, 05/31/2022); LEFT HEART CATH AND CORS/GRAFTS ANGIOGRAPHY (Left, 01/23/2022); and Thoracic laminectomy for spinal cord stimulator (N/A, 10/31/2023). Family: family history includes Heart attack in his father.  Laboratory Chemistry Profile   Renal Lab Results  Component Value Date   BUN 17 01/16/2024   CREATININE 1.41 (H) 01/16/2024   GFRAA >60 08/06/2019   GFRNONAA 51 (L) 01/16/2024    Hepatic Lab Results  Component Value Date   AST 38 01/16/2024   ALT 12 01/16/2024   ALBUMIN 3.5 01/16/2024   ALKPHOS 65 01/16/2024   LIPASE 356 (H)  08/06/2019   AMMONIA 29 08/06/2019    Electrolytes Lab Results  Component Value Date   NA 137 01/16/2024   K 4.5 01/16/2024   CL 102 01/16/2024   CALCIUM  10.1 01/16/2024   MG 2.1 05/20/2021    Bone No results found for: VD25OH, VD125OH2TOT, CI6874NY7, CI7874NY7, 25OHVITD1, 25OHVITD2, 25OHVITD3, TESTOFREE, TESTOSTERONE  Inflammation (CRP: Acute Phase) (ESR: Chronic Phase) Lab Results  Component Value Date   LATICACIDVEN 0.6 12/10/2023         Note: Above Lab results reviewed.  Recent Imaging Review  DG Lumbar Spine 2-3 Views CLINICAL DATA:  Spinal stimulator, low back pain  EXAM: LUMBAR SPINE - 2-3 VIEW  COMPARISON:  09/27/2022  FINDINGS: Post instrumented fusion L4-S1. Stable hardware across the right sacroiliac joint. Implanted generator is noted, cephalad extent of lead not visualized on this study. No fracture or dislocation. Mild narrowing of interspaces L2-L4. Aortic Atherosclerosis (ICD10-170.0).  IMPRESSION: 1. No acute findings. 2. Post   fusion L4-S1.  Electronically Signed   By: JONETTA Faes M.D.   On: 12/10/2023 18:11 DG Thoracic Spine 2 View CLINICAL DATA:  [id number] Pre-op exam  [id number]  EXAM: THORACIC SPINE 2 VIEWS  COMPARISON:  10/31/2023  FINDINGS: Mild thoracic kyphosis. No fracture or dislocation. Dorsal stimulator catheters extend up to the T8 level. Sternotomy wires. AICD leads partially visualized.  IMPRESSION: 1. No acute findings. 2. Dorsal stimulator catheters.  Electronically Signed   By: JONETTA Faes M.D.   On: 12/10/2023 18:09 Note: Reviewed        Physical Exam  Vitals: BP 125/73   Pulse 67   Temp (!) 97.3 F (36.3 C)   Ht 5' 9 (1.753 m)   Wt 180 lb (81.6 kg)   SpO2 99%   BMI 26.58 kg/m  BMI: Estimated body mass index is 26.58 kg/m as calculated from the following:   Height as of this encounter: 5' 9 (1.753 m).   Weight as of this encounter: 180 lb (81.6 kg). Ideal: Ideal body weight: 70.7 kg (155  lb 13.8 oz) Adjusted ideal body weight: 75.1 kg (165 lb 8.3 oz) General appearance: Well nourished, well developed, and well hydrated. In no apparent acute distress Mental status: Alert, oriented x 3 (person, place, & time)       Respiratory: No evidence of acute respiratory distress Eyes: PERLA  Musculoskeletal: +LBP (lower right side) Assessment   Diagnosis Status  1. Failed back surgical syndrome   2. Chronic pain syndrome   3. Medication management   4. Lumbar radiculopathy   5. Chronic radicular lumbar pain   6. History of lumbar fusion (L4-S1)   7. SI joint arthritis    Controlled Controlled Controlled   Updated Problems: No problems updated.  Plan of Care  Problem-specific:  Assessment and Plan    Lumbar postlaminectomy syndrome and lumbar radiculopathy Chronic lower back pain at L5-S1, radiating to hip, managed with Percocet. No significant side effects, but constipation risk noted. - Prescribed Percocet every twelve hours as needed, three-month supply to [facility]. - Advised constipation management with OTC stool softeners or increased dietary fiber. - Encouraged adequate hydration to prevent constipation.   Chronic pain syndrome: Patient's pain is well-controlled with oxycodone , will continue on current medication regimen.  Prescribing drug monitoring (PDMP) reviewed; findings consistent with the use of prescribed medication and no evidence of narcotic misuse or abuse.  Urine drug screening (UDS) up to date and consistent with the use of prescribed medication.  - Encouraged adequate hydration to prevent constipation. - Advised constipation management with OTC stool softeners or increased dietary fiber. Schedule follow-up in 90 days for medication management.  Medication management: Continue with current pain medication regimen. Meds ordered this encounter  Medications   oxyCODONE -acetaminophen  (PERCOCET) 10-325 MG tablet    Sig: Take 1 tablet by mouth every  12 (twelve) hours as needed for pain. Must last 30 days.    Dispense:  60 tablet    Refill:  0    Chronic Pain: STOP Act (Not applicable) Fill 1 day early if closed on refill date. Avoid benzodiazepines within 8 hours of opioids   oxyCODONE -acetaminophen  (PERCOCET) 10-325 MG tablet    Sig: Take 1 tablet by mouth every 12 (twelve) hours as needed for pain. Must last 30 days.    Dispense:  60 tablet    Refill:  0    Chronic Pain: STOP Act (Not applicable) Fill 1 day early if closed on refill date. Avoid benzodiazepines within 8 hours of opioids   oxyCODONE -acetaminophen  (PERCOCET) 10-325 MG tablet    Sig: Take 1 tablet by mouth every 12 (twelve) hours as needed for pain. Must  last 30 days.    Dispense:  60 tablet    Refill:  0    Chronic Pain: STOP Act (Not applicable) Fill 1 day early if closed on refill date. Avoid benzodiazepines within 8 hours of opioids        Mr. Alistair Senft has a current medication list which includes the following long-term medication(s): allopurinol , donepezil , metoprolol  succinate, pregabalin, ranolazine , rosuvastatin , sertraline , and torsemide.  Pharmacotherapy (Medications Ordered): Meds ordered this encounter  Medications   oxyCODONE -acetaminophen  (PERCOCET) 10-325 MG tablet    Sig: Take 1 tablet by mouth every 12 (twelve) hours as needed for pain. Must last 30 days.    Dispense:  60 tablet    Refill:  0    Chronic Pain: STOP Act (Not applicable) Fill 1 day early if closed on refill date. Avoid benzodiazepines within 8 hours of opioids   oxyCODONE -acetaminophen  (PERCOCET) 10-325 MG tablet    Sig: Take 1 tablet by mouth every 12 (twelve) hours as needed for pain. Must last 30 days.    Dispense:  60 tablet    Refill:  0    Chronic Pain: STOP Act (Not applicable) Fill 1 day early if closed on refill date. Avoid benzodiazepines within 8 hours of opioids   oxyCODONE -acetaminophen  (PERCOCET) 10-325 MG tablet    Sig: Take 1 tablet by mouth every 12  (twelve) hours as needed for pain. Must last 30 days.    Dispense:  60 tablet    Refill:  0    Chronic Pain: STOP Act (Not applicable) Fill 1 day early if closed on refill date. Avoid benzodiazepines within 8 hours of opioids   Orders:  No orders of the defined types were placed in this encounter.       Return in about 3 months (around 10/15/2024) for (F2F), (MM), Emmy Blanch NP.    Recent Visits No visits were found meeting these conditions. Showing recent visits within past 90 days and meeting all other requirements Today's Visits Date Type Provider Dept  07/15/24 Office Visit Rontrell Moquin K, NP [facility]  Showing today's visits and meeting all other requirements Future Appointments Date Type Provider Dept  10/12/24 Appointment Jermar Colter K, NP [facility]  Showing future appointments within next 90 days and meeting all other requirements  I discussed the assessment and treatment plan with the patient. The patient was provided an opportunity to ask questions and all were answered. The patient agreed with the plan and demonstrated an understanding of the instructions.  Patient advised to call back or seek an in-person evaluation if the symptoms or condition worsens.  I personally spent a total of 30 minutes in the care of the patient today including preparing to see the patient, getting/reviewing separately obtained history, performing a medically appropriate exam/evaluation, counseling and educating, placing orders, referring and communicating with other health care professionals, documenting clinical information in the EHR, independently interpreting results, communicating results, and coordinating care.   Note by: Tashunda Vandezande K Treylin Burtch, NP (TTS and AI technology used. I apologize for any typographical errors that were not detected and corrected.) Date: 07/15/2024; Time: 1:43 PM

## 2024-10-06 NOTE — Progress Notes (Unsigned)
 PROVIDER NOTE: Interpretation of information contained herein should be left to medically-trained personnel. Specific patient instructions are provided elsewhere under Patient Instructions section of medical record. This document was created in part using AI and STT-dictation technology, any transcriptional errors that may result from this process are unintentional.  Patient: Danny Marquez  Service: E/M   PCP: [facility]  DOB: 14-Jul-1946  DOS: 10/07/2024  Provider: Emmy MARLA Blanch, NP  MRN: [id number]  Delivery: Face-to-face  Specialty: Interventional Pain Management  Type: Established Patient  Setting: Ambulatory outpatient facility  Specialty designation: 09  Referring Prov.: [facility]  Location: Outpatient office facility       History of present illness (HPI) [name], a 79 y.o. year old male, is here today because of his No primary diagnosis found.. [name]'s primary complain today is No chief complaint on file.  Pertinent problems: Danny Marquez has Atrial fibrillation with RVR (HCC); Atrial fibrillation (HCC); Burning sensation of feet; Chronic back pain; Chronic systolic congestive heart failure (HCC); Gastroesophageal reflux disease; Hypertension; Numbness and tingling of foot; Spinal stenosis, lumbar region, with neurogenic claudication; History of coronary artery bypass surgery; History of lumbar fusion; Chronic radicular lumbar pain; S/P fusion of sacroiliac joint (right); Chronic pain syndrome; Chronic right SI joint pain; Lumbar spondylosis; Failed back surgical syndrome; Back pain; Mild dementia (HCC); and Overweight (BMI 25.0-29.9) on their pertinent problem list.  Pain Assessment: Severity of   is reported as a  /10. Location:    / . Onset:  . Quality:  . Timing:  . Modifying factor(s):  SABRA Vitals:  vitals were not taken for this visit.  BMI: Estimated body mass index is 26.58 kg/m as calculated from the following:   Height as of 07/15/24: 5' 9  (1.753 m).   Weight as of 07/15/24: 180 lb (81.6 kg).  Last encounter: 07/15/2024. Last procedure: Visit date not found.  Reason for encounter:  *** .   Discussed the use of AI scribe software for clinical note transcription with the patient, who gave verbal consent to proceed.  History of Present Illness           Pharmacotherapy Assessment   Oxycodone -acetaminophen  (Percocet) 10-325 mg tablet every 12 hours as needed for pain. MME=30  Monitoring:  PMP: PDMP not reviewed this encounter.       Pharmacotherapy: No side-effects or adverse reactions reported. Compliance: No problems identified. Effectiveness: Clinically acceptable.  No notes on file  UDS:  Summary  Date Value Ref Range Status  02/12/2024 FINAL  Final    Comment:    ==================================================================== [employer] (MW) ==================================================================== Test                             Result       Flag       Units  Drug Present and Declared for Prescription Verification   Oxycodone                       1236         EXPECTED   ng/mg creat   Oxymorphone                    289          EXPECTED   ng/mg creat   Noroxycodone                   1852  EXPECTED   ng/mg creat   Noroxymorphone                 54           EXPECTED   ng/mg creat    Sources of oxycodone  are scheduled prescription medications.    Oxymorphone, noroxycodone, and noroxymorphone are expected    metabolites of oxycodone . Oxymorphone is also available as a    scheduled prescription medication.  ==================================================================== Test                      Result    Flag   Units      Ref Range   Creatinine              115              mg/dL      >=79 ==================================================================== Declared Medications:  The flagging and interpretation on this report are based on the  following declared  medications.  Unexpected results may arise from  inaccuracies in the declared medications.   **Note: The testing scope of this panel includes these medications:   Oxycodone  (Percocet)   **Note: The testing scope of this panel does not include the  following reported medications:   Acetaminophen  (Percocet)  Allopurinol  (Zyloprim )  Aspirin   Donepezil  (Aricept )  Empagliflozin (Jardiance)  Losartan  (Cozaar )  Metoprolol  (Toprol )  Pregabalin (Lyrica)  Ranolazine  (Ranexa )  Rosuvastatin  (Crestor )  Sertraline  (Zoloft )  Torsemide (Demadex)  Trazodone  (Desyrel ) ==================================================================== For clinical consultation, please call [phone]. ====================================================================     No results found for: CBDTHCR No results found for: D8THCCBX No results found for: D9THCCBX  ROS  Constitutional: Denies any fever or chills Gastrointestinal: No reported hemesis, hematochezia, vomiting, or acute GI distress Musculoskeletal: Denies any acute onset joint swelling, redness, loss of ROM, or weakness Neurological: No reported episodes of acute onset apraxia, aphasia, dysarthria, agnosia, amnesia, paralysis, loss of coordination, or loss of consciousness  Medication Review  allopurinol , aspirin  EC, donepezil , empagliflozin, metoprolol  succinate, oxyCODONE -acetaminophen , pregabalin, ranolazine , rosuvastatin , sertraline , torsemide, and traZODone   History Review  Allergy: [name] is allergic to codeine. Drug: [name]  reports that he does not currently use drugs. Alcohol:  reports that he does not currently use alcohol. Tobacco:  reports that he quit smoking about 21 years ago. His smoking use included cigarettes. He has never used smokeless tobacco. Social: [name]  reports that he quit smoking about 21 years ago. His smoking use included cigarettes. He has never used smokeless tobacco. He reports that he  does not currently use alcohol. He reports that he does not currently use drugs. 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), Coronary artery disease, Depression, Difficulty walking, Diverticulosis, Failed back surgical syndrome, GERD (gastroesophageal reflux disease), Gout, Hepatic fibrosis, History of alcohol dependence (HCC), History of left atrial appendage closure (03/19/2022), HTN (hypertension), Hyperlipidemia, Insomnia, Iron deficiency, Ischemic cardiomyopathy, LBBB (left bundle branch block), Mild dementia (HCC), 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 Surgical: Mr. Swiss  has a past surgical history that includes Coronary artery bypass graft (N/A, 09/13/2006); Sacroiliac joint fusion (Right); CARDIOVERSION (N/A, 01/23/2017); CARDIOVERSION (N/A, 02/01/2017); Shoulder arthroscopy (Right); Colonoscopy with propofol  (N/A, 03/05/2019); Esophagogastroduodenoscopy (egd) with propofol  (N/A, 03/05/2019); LEFT HEART CATH AND CORONARY ANGIOGRAPHY (Left, 09/2006); WATCHMAN IMPLANT (N/A, 03/19/2022); Total knee arthroplasty (Bilateral); Lumbar spine surgery; Cholecystectomy; V TACH ABLATION (N/A, 05/24/2022); BIV [employer] ICD INSERTION CRT-D (N/A,  07/23/2021); V TACH ABLATION (successful staged procedure) (N/A, 05/31/2022); LEFT HEART CATH AND CORS/GRAFTS ANGIOGRAPHY (Left, 01/23/2022); and Thoracic laminectomy for spinal cord stimulator (N/A, 10/31/2023). Family: family history includes Heart attack in his father.  Laboratory Chemistry Profile   Renal Lab Results  Component Value Date   BUN 17 01/16/2024   CREATININE 1.41 (H) 01/16/2024   GFRAA >60 08/06/2019   GFRNONAA 51 (L) 01/16/2024    Hepatic Lab Results  Component Value Date   AST 38 01/16/2024   ALT 12 01/16/2024   ALBUMIN 3.5 01/16/2024   ALKPHOS 65 01/16/2024   LIPASE 356 (H) 08/06/2019   AMMONIA 29 08/06/2019    Electrolytes Lab Results  Component Value Date   NA 137 01/16/2024   K 4.5 01/16/2024   CL 102 01/16/2024   CALCIUM  10.1 01/16/2024   MG 2.1 05/20/2021    Bone No results found for: VD25OH, VD125OH2TOT, CI6874NY7, CI7874NY7, 25OHVITD1, 25OHVITD2, 25OHVITD3, TESTOFREE, TESTOSTERONE  Inflammation (CRP: Acute Phase) (ESR: Chronic Phase) Lab Results  Component Value Date   LATICACIDVEN 0.6 12/10/2023         Note: Above Lab results reviewed.  Recent Imaging Review  DG Lumbar Spine 2-3 Views CLINICAL DATA:  Spinal stimulator, low back pain  EXAM: LUMBAR SPINE - 2-3 VIEW  COMPARISON:  09/27/2022  FINDINGS: Post instrumented fusion L4-S1. Stable hardware across the right sacroiliac joint. Implanted generator is noted, cephalad extent of lead not visualized on this study. No fracture or dislocation. Mild narrowing of interspaces L2-L4. Aortic Atherosclerosis (ICD10-170.0).  IMPRESSION: 1. No acute findings. 2. Post   fusion L4-S1.  Electronically Signed   By: JONETTA Faes M.D.   On: 12/10/2023 18:11 DG Thoracic Spine 2 View CLINICAL DATA:  [id number] Pre-op exam [id number]  EXAM: THORACIC SPINE 2 VIEWS  COMPARISON:  10/31/2023  FINDINGS: Mild thoracic kyphosis. No fracture or dislocation. Dorsal stimulator catheters  extend up to the T8 level. Sternotomy wires. AICD leads partially visualized.  IMPRESSION: 1. No acute findings. 2. Dorsal stimulator catheters.  Electronically Signed   By: JONETTA Faes M.D.   On: 12/10/2023 18:09 Note: Reviewed        Physical Exam  Vitals: There were no vitals taken for this visit. BMI: Estimated body mass index is 26.58 kg/m as calculated from the following:   Height as of 07/15/24: 5' 9 (1.753 m).   Weight as of 07/15/24: 180 lb (81.6 kg). Ideal: Patient weight not recorded General appearance: Well nourished, well developed, and well hydrated. In no apparent acute distress Mental status: Alert, oriented x 3 (person, place, & time)       Respiratory: No evidence of acute respiratory distress Eyes: PERLA   Assessment   Diagnosis Status  No diagnosis found. Controlled Controlled Controlled   Updated Problems: No problems updated.  Plan of Care  Problem-specific:  Assessment and Plan            Mr. Brogan Martis has a current medication list which includes the following long-term medication(s): allopurinol , donepezil , metoprolol  succinate, pregabalin, ranolazine , rosuvastatin , sertraline , and  torsemide.  Pharmacotherapy (Medications Ordered): No orders of the defined types were placed in this encounter.  Orders:  No orders of the defined types were placed in this encounter.    {There is no content from the last Plan section.}   No follow-ups on file.    Recent Visits Date Type Provider Dept  07/15/24 Office Visit Emaya Preston K, NP [facility]  Showing recent visits within past 90 days and meeting all other requirements Future Appointments Date Type Provider Dept  10/07/24 Appointment Monterius Rolf K, NP [facility]  Showing future appointments within next 90 days and meeting all other requirements  I discussed the assessment and treatment plan with the patient. The patient was provided an opportunity to ask  questions and all were answered. The patient agreed with the plan and demonstrated an understanding of the instructions.  Patient advised to call back or seek an in-person evaluation if the symptoms or condition worsens.  Duration of encounter: *** minutes.  Total time on encounter, as per AMA guidelines included both the face-to-face and non-face-to-face time personally spent by the physician and/or other qualified health care professional(s) on the day of the encounter (includes time in activities that require the physician or other qualified health care professional and does not include time in activities normally performed by clinical staff). Physician's time may include the following activities when performed: Preparing to see the patient (e.g., pre-charting review of records, searching for previously ordered imaging, lab work, and nerve conduction tests) Review of prior analgesic pharmacotherapies. Reviewing PMP Interpreting ordered tests (e.g., lab work, imaging, nerve conduction tests) Performing post-procedure evaluations, including interpretation of diagnostic procedures Obtaining and/or reviewing separately obtained history Performing a medically appropriate examination and/or evaluation Counseling and educating the patient/family/caregiver Ordering medications, tests, or procedures Referring and communicating with other health care professionals (when not separately reported) Documenting clinical information in the electronic or other health record Independently interpreting results (not separately reported) and communicating results to the patient/ family/caregiver Care coordination (not separately reported)  Note by: Adler Alton K Casara Perrier, NP (TTS and AI technology used. I apologize for any typographical errors that were not detected and corrected.) Date: 10/07/2024; Time: 1:17 PM

## 2024-10-07 ENCOUNTER — Ambulatory Visit: Attending: Nurse Practitioner | Admitting: Nurse Practitioner

## 2024-10-07 ENCOUNTER — Encounter: Payer: Self-pay | Admitting: Nurse Practitioner

## 2024-10-07 VITALS — BP 103/70 | HR 71 | Temp 97.0°F | Resp 20 | Ht 65.0 in | Wt 170.0 lb

## 2024-10-07 DIAGNOSIS — I110 Hypertensive heart disease with heart failure: Secondary | ICD-10-CM | POA: Insufficient documentation

## 2024-10-07 DIAGNOSIS — G8929 Other chronic pain: Secondary | ICD-10-CM

## 2024-10-07 DIAGNOSIS — Z951 Presence of aortocoronary bypass graft: Secondary | ICD-10-CM | POA: Insufficient documentation

## 2024-10-07 DIAGNOSIS — M961 Postlaminectomy syndrome, not elsewhere classified: Secondary | ICD-10-CM | POA: Diagnosis not present

## 2024-10-07 DIAGNOSIS — M47816 Spondylosis without myelopathy or radiculopathy, lumbar region: Secondary | ICD-10-CM

## 2024-10-07 DIAGNOSIS — M461 Sacroiliitis, not elsewhere classified: Secondary | ICD-10-CM | POA: Diagnosis not present

## 2024-10-07 DIAGNOSIS — M48062 Spinal stenosis, lumbar region with neurogenic claudication: Secondary | ICD-10-CM | POA: Insufficient documentation

## 2024-10-07 DIAGNOSIS — Z981 Arthrodesis status: Secondary | ICD-10-CM | POA: Insufficient documentation

## 2024-10-07 DIAGNOSIS — I4891 Unspecified atrial fibrillation: Secondary | ICD-10-CM | POA: Insufficient documentation

## 2024-10-07 DIAGNOSIS — M5416 Radiculopathy, lumbar region: Secondary | ICD-10-CM | POA: Diagnosis not present

## 2024-10-07 DIAGNOSIS — G894 Chronic pain syndrome: Secondary | ICD-10-CM | POA: Diagnosis not present

## 2024-10-07 DIAGNOSIS — M4726 Other spondylosis with radiculopathy, lumbar region: Secondary | ICD-10-CM | POA: Insufficient documentation

## 2024-10-07 DIAGNOSIS — I5022 Chronic systolic (congestive) heart failure: Secondary | ICD-10-CM | POA: Insufficient documentation

## 2024-10-07 DIAGNOSIS — K219 Gastro-esophageal reflux disease without esophagitis: Secondary | ICD-10-CM | POA: Insufficient documentation

## 2024-10-07 NOTE — Progress Notes (Signed)
 Nursing Pain Medication Assessment:  Safety precautions to be maintained throughout the outpatient stay will include: orient to surroundings, keep bed in low position, maintain call bell within reach at all times, provide assistance with transfer out of bed and ambulation.  Medication Inspection Compliance: Pill count conducted under aseptic conditions, in front of the patient. Neither the pills nor the bottle was removed from the patient's sight at any time. Once count was completed pills were immediately returned to the patient in their original bottle.  Medication: Oxycodone /APAP Pill/Patch Count: 47 of 60 pills/patches remain Pill/Patch Appearance: Markings consistent with prescribed medication Bottle Appearance: Standard pharmacy container. Clearly labeled. Filled Date: 31 / 05 / 2025 Last Medication intake:  Today

## 2024-10-12 ENCOUNTER — Encounter: Admitting: Nurse Practitioner

## 2024-12-28 ENCOUNTER — Encounter: Admitting: Nurse Practitioner
